# Patient Record
Sex: Male | Born: 1975 | Race: White | Hispanic: No | Marital: Married | Smoking: Former smoker
Health system: Southern US, Community
[De-identification: ages and names within clinical notes are randomized; demographics above are authoritative.]

## PROBLEM LIST (undated history)

## (undated) DIAGNOSIS — R55 Syncope and collapse: Secondary | ICD-10-CM

## (undated) DIAGNOSIS — I10 Essential (primary) hypertension: Secondary | ICD-10-CM

## (undated) DIAGNOSIS — Z8719 Personal history of other diseases of the digestive system: Secondary | ICD-10-CM

## (undated) DIAGNOSIS — E781 Pure hyperglyceridemia: Secondary | ICD-10-CM

## (undated) DIAGNOSIS — F419 Anxiety disorder, unspecified: Secondary | ICD-10-CM

## (undated) DIAGNOSIS — I779 Disorder of arteries and arterioles, unspecified: Secondary | ICD-10-CM

## (undated) DIAGNOSIS — E669 Obesity, unspecified: Secondary | ICD-10-CM

## (undated) DIAGNOSIS — Z8711 Personal history of peptic ulcer disease: Secondary | ICD-10-CM

## (undated) DIAGNOSIS — N200 Calculus of kidney: Secondary | ICD-10-CM

## (undated) DIAGNOSIS — G43909 Migraine, unspecified, not intractable, without status migrainosus: Secondary | ICD-10-CM

## (undated) DIAGNOSIS — F439 Reaction to severe stress, unspecified: Secondary | ICD-10-CM

## (undated) DIAGNOSIS — K859 Acute pancreatitis without necrosis or infection, unspecified: Secondary | ICD-10-CM

## (undated) HISTORY — DX: Disorder of arteries and arterioles, unspecified: I77.9

## (undated) HISTORY — DX: Syncope and collapse: R55

## (undated) HISTORY — DX: Reaction to severe stress, unspecified: F43.9

## (undated) HISTORY — PX: CARDIAC CATHETERIZATION: SHX172

## (undated) HISTORY — DX: Pure hyperglyceridemia: E78.1

## (undated) HISTORY — DX: Obesity, unspecified: E66.9

## (undated) HISTORY — DX: Anxiety disorder, unspecified: F41.9

---

## 2000-12-16 ENCOUNTER — Emergency Department (HOSPITAL_COMMUNITY): Admission: EM | Admit: 2000-12-16 | Discharge: 2000-12-16 | Payer: Self-pay | Admitting: Internal Medicine

## 2001-02-01 ENCOUNTER — Emergency Department (HOSPITAL_COMMUNITY): Admission: EM | Admit: 2001-02-01 | Discharge: 2001-02-02 | Payer: Self-pay | Admitting: *Deleted

## 2001-05-01 ENCOUNTER — Emergency Department (HOSPITAL_COMMUNITY): Admission: EM | Admit: 2001-05-01 | Discharge: 2001-05-02 | Payer: Self-pay | Admitting: *Deleted

## 2001-07-23 ENCOUNTER — Emergency Department (HOSPITAL_COMMUNITY): Admission: EM | Admit: 2001-07-23 | Discharge: 2001-07-23 | Payer: Self-pay | Admitting: Emergency Medicine

## 2001-08-01 ENCOUNTER — Emergency Department (HOSPITAL_COMMUNITY): Admission: EM | Admit: 2001-08-01 | Discharge: 2001-08-01 | Payer: Self-pay | Admitting: Emergency Medicine

## 2002-01-01 ENCOUNTER — Emergency Department (HOSPITAL_COMMUNITY): Admission: EM | Admit: 2002-01-01 | Discharge: 2002-01-01 | Payer: Self-pay | Admitting: Emergency Medicine

## 2002-01-01 ENCOUNTER — Encounter: Payer: Self-pay | Admitting: Emergency Medicine

## 2004-06-09 ENCOUNTER — Ambulatory Visit (HOSPITAL_COMMUNITY): Admission: RE | Admit: 2004-06-09 | Discharge: 2004-06-09 | Payer: Self-pay | Admitting: Family Medicine

## 2004-12-05 ENCOUNTER — Emergency Department (HOSPITAL_COMMUNITY): Admission: EM | Admit: 2004-12-05 | Discharge: 2004-12-05 | Payer: Self-pay | Admitting: *Deleted

## 2004-12-06 ENCOUNTER — Emergency Department (HOSPITAL_COMMUNITY): Admission: EM | Admit: 2004-12-06 | Discharge: 2004-12-06 | Payer: Self-pay | Admitting: *Deleted

## 2005-11-20 ENCOUNTER — Emergency Department (HOSPITAL_COMMUNITY): Admission: EM | Admit: 2005-11-20 | Discharge: 2005-11-20 | Payer: Self-pay | Admitting: Emergency Medicine

## 2006-04-10 ENCOUNTER — Emergency Department (HOSPITAL_COMMUNITY): Admission: EM | Admit: 2006-04-10 | Discharge: 2006-04-10 | Payer: Self-pay | Admitting: Emergency Medicine

## 2007-06-30 ENCOUNTER — Emergency Department (HOSPITAL_COMMUNITY): Admission: EM | Admit: 2007-06-30 | Discharge: 2007-06-30 | Payer: Self-pay | Admitting: Emergency Medicine

## 2008-01-12 ENCOUNTER — Emergency Department (HOSPITAL_COMMUNITY): Admission: EM | Admit: 2008-01-12 | Discharge: 2008-01-12 | Payer: Self-pay | Admitting: Emergency Medicine

## 2009-09-25 ENCOUNTER — Emergency Department (HOSPITAL_COMMUNITY): Admission: EM | Admit: 2009-09-25 | Discharge: 2009-09-25 | Payer: Self-pay | Admitting: Emergency Medicine

## 2009-11-13 ENCOUNTER — Emergency Department (HOSPITAL_COMMUNITY): Admission: EM | Admit: 2009-11-13 | Discharge: 2009-11-14 | Payer: Self-pay | Admitting: Emergency Medicine

## 2009-11-20 ENCOUNTER — Emergency Department (HOSPITAL_COMMUNITY): Admission: EM | Admit: 2009-11-20 | Discharge: 2009-11-20 | Payer: Self-pay | Admitting: Emergency Medicine

## 2009-12-01 ENCOUNTER — Emergency Department (HOSPITAL_COMMUNITY): Admission: EM | Admit: 2009-12-01 | Discharge: 2009-12-01 | Payer: Self-pay | Admitting: Emergency Medicine

## 2010-01-10 ENCOUNTER — Emergency Department (HOSPITAL_COMMUNITY): Admission: EM | Admit: 2010-01-10 | Discharge: 2010-01-10 | Payer: Self-pay | Admitting: Emergency Medicine

## 2010-02-19 ENCOUNTER — Emergency Department (HOSPITAL_COMMUNITY): Admission: EM | Admit: 2010-02-19 | Discharge: 2010-02-19 | Payer: Self-pay | Admitting: Emergency Medicine

## 2010-08-02 ENCOUNTER — Emergency Department (HOSPITAL_COMMUNITY): Payer: Self-pay

## 2010-08-02 ENCOUNTER — Emergency Department (HOSPITAL_COMMUNITY)
Admission: EM | Admit: 2010-08-02 | Discharge: 2010-08-02 | Disposition: A | Discharge status: Home / Self Care | Payer: Self-pay | Attending: Emergency Medicine | Admitting: Emergency Medicine

## 2010-08-02 DIAGNOSIS — R079 Chest pain, unspecified: Secondary | ICD-10-CM | POA: Insufficient documentation

## 2010-08-02 DIAGNOSIS — IMO0002 Reserved for concepts with insufficient information to code with codable children: Secondary | ICD-10-CM | POA: Insufficient documentation

## 2010-08-02 DIAGNOSIS — X503XXA Overexertion from repetitive movements, initial encounter: Secondary | ICD-10-CM | POA: Insufficient documentation

## 2010-08-25 LAB — URINALYSIS, ROUTINE W REFLEX MICROSCOPIC
Bilirubin Urine: NEGATIVE
Glucose, UA: NEGATIVE mg/dL
Ketones, ur: NEGATIVE mg/dL
Leukocytes, UA: NEGATIVE
Nitrite: NEGATIVE
Nitrite: POSITIVE — AB
Protein, ur: NEGATIVE mg/dL
Specific Gravity, Urine: 1.02 (ref 1.005–1.030)
Urobilinogen, UA: 0.2 mg/dL (ref 0.0–1.0)
Urobilinogen, UA: 1 mg/dL (ref 0.0–1.0)

## 2010-08-25 LAB — URINE MICROSCOPIC-ADD ON

## 2010-08-25 LAB — URINE CULTURE: Culture: NO GROWTH

## 2010-08-25 LAB — POCT I-STAT, CHEM 8
Calcium, Ion: 1.18 mmol/L (ref 1.12–1.32)
Creatinine, Ser: 0.9 mg/dL (ref 0.4–1.5)
Potassium: 4.1 mEq/L (ref 3.5–5.1)

## 2010-08-26 LAB — URINALYSIS, ROUTINE W REFLEX MICROSCOPIC
Glucose, UA: NEGATIVE mg/dL
Ketones, ur: NEGATIVE mg/dL
Specific Gravity, Urine: >1.03 — ABNORMAL HIGH (ref 1.005–1.030)

## 2010-08-26 LAB — POCT I-STAT, CHEM 8
Calcium, Ion: 1.13 mmol/L (ref 1.12–1.32)
Chloride: 106 mEq/L (ref 96–112)
HCT: 47 % (ref 39.0–52.0)
Hemoglobin: 16 g/dL (ref 13.0–17.0)
Potassium: 3.9 mEq/L (ref 3.5–5.1)
Sodium: 141 mEq/L (ref 135–145)
TCO2: 28 mmol/L (ref 0–100)

## 2010-08-26 LAB — URINE MICROSCOPIC-ADD ON

## 2010-08-28 LAB — RPR: RPR Ser Ql: NONREACTIVE

## 2010-08-28 LAB — RAPID STREP SCREEN (MED CTR MEBANE ONLY): Streptococcus, Group A Screen (Direct): NEGATIVE

## 2011-03-02 LAB — DIFFERENTIAL
Eosinophils Absolute: 0.3
Eosinophils Relative: 2
Lymphs Abs: 2.6
Neutro Abs: 7.2
Neutrophils Relative %: 65

## 2011-03-02 LAB — CBC
HCT: 46.6
MCHC: 34.1
MCV: 91
WBC: 11.1 — ABNORMAL HIGH

## 2011-03-02 LAB — BASIC METABOLIC PANEL
BUN: 14
CO2: 24
Chloride: 105
Glucose, Bld: 109 — ABNORMAL HIGH

## 2011-03-02 LAB — INFLUENZA A+B VIRUS AG-DIRECT(RAPID): Inflenza A Ag: NEGATIVE

## 2011-03-10 LAB — DIFFERENTIAL
Basophils Absolute: 0
Basophils Relative: 0
Lymphocytes Relative: 29
Lymphs Abs: 3.3
Monocytes Relative: 6
Neutrophils Relative %: 61

## 2011-03-10 LAB — BASIC METABOLIC PANEL
BUN: 14
CO2: 25
Creatinine, Ser: 1.11
GFR calc Af Amer: >60
GFR calc non Af Amer: >60
Potassium: 3.4 — ABNORMAL LOW
Sodium: 139

## 2011-03-10 LAB — CBC
HCT: 46
MCHC: 34.3
MCV: 93.8
Platelets: 266
RBC: 4.9

## 2012-03-03 ENCOUNTER — Encounter (HOSPITAL_COMMUNITY): Payer: Self-pay

## 2012-03-03 ENCOUNTER — Emergency Department (HOSPITAL_COMMUNITY)
Admission: EM | Admit: 2012-03-03 | Discharge: 2012-03-03 | Disposition: A | Discharge status: Home / Self Care | Payer: Self-pay | Attending: Emergency Medicine | Admitting: Emergency Medicine

## 2012-03-03 ENCOUNTER — Emergency Department (HOSPITAL_COMMUNITY): Payer: Self-pay

## 2012-03-03 DIAGNOSIS — R109 Unspecified abdominal pain: Secondary | ICD-10-CM | POA: Insufficient documentation

## 2012-03-03 DIAGNOSIS — N200 Calculus of kidney: Secondary | ICD-10-CM | POA: Insufficient documentation

## 2012-03-03 LAB — URINE MICROSCOPIC-ADD ON

## 2012-03-03 LAB — URINALYSIS, ROUTINE W REFLEX MICROSCOPIC
Glucose, UA: NEGATIVE mg/dL
Leukocytes, UA: NEGATIVE
Protein, ur: NEGATIVE mg/dL
pH: 6 (ref 5.0–8.0)

## 2012-03-03 MED ORDER — OXYCODONE-ACETAMINOPHEN 5-325 MG PO TABS: 1.0000 | ORAL_TABLET | Freq: Four times a day (QID) | ORAL | Status: DC | PRN

## 2012-03-03 MED ORDER — HYDROMORPHONE HCL PF 1 MG/ML IJ SOLN
1.0000 mg | Freq: Once | INTRAMUSCULAR | Status: AC
  Administered 2012-03-03: 1 mg via INTRAVENOUS
  Filled 2012-03-03: qty 1

## 2012-03-03 MED ORDER — KETOROLAC TROMETHAMINE 30 MG/ML IJ SOLN
30.0000 mg | Freq: Once | INTRAMUSCULAR | Status: AC
  Administered 2012-03-03: 30 mg via INTRAVENOUS
  Filled 2012-03-03: qty 1

## 2012-03-03 MED ORDER — ONDANSETRON HCL 4 MG/2ML IJ SOLN
4.0000 mg | Freq: Once | INTRAMUSCULAR | Status: AC
  Administered 2012-03-03: 4 mg via INTRAVENOUS
  Filled 2012-03-03: qty 2

## 2012-03-03 NOTE — ED Notes (Signed)
Pt reports left flank pain since yesterday, h/o stones, pain became so severe he could not wait to urologist in the am

## 2012-03-03 NOTE — ED Provider Notes (Signed)
History     CSN: [id number]  Arrival date & time 03/03/12  4782   First MD Initiated Contact with Patient 03/03/12 1849      Chief Complaint  Patient presents with  . Flank Pain    (Consider location/radiation/quality/duration/timing/severity/associated sxs/prior treatment) HPI Comments: Patient with history of kidney stones.  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  It has rapidly worsened since that time.  No urinary complaints.  No bowel or bladder complaints.  Feels like prior stones.    Patient is a 36 y.o. male presenting with flank pain. The history is provided by the patient.  Flank Pain This is a recurrent problem. The current episode started yesterday. The problem occurs constantly. The problem has been rapidly worsening. Associated symptoms include abdominal pain. Nothing aggravates the symptoms. Nothing relieves the symptoms. He has tried nothing for the symptoms.    Past Medical History  Diagnosis Date  . Renal disorder     History reviewed. No pertinent past surgical history.  No family history on file.  History  Substance Use Topics  . Smoking status: Current Every Day Smoker  . Smokeless tobacco: Not on file  . Alcohol Use: No      Review of Systems  Gastrointestinal: Positive for abdominal pain.  Genitourinary: Positive for flank pain.  All other systems reviewed and are negative.    Allergies  Review of patient's allergies indicates not on file.  Home Medications  No current outpatient prescriptions on file.  BP 147/98  Pulse 116  Temp 98.4 F (36.9 C) (Oral)  Resp 20  Ht 6\' 3"  (1.905 m)  Wt 265 lb (120.203 kg)  BMI 33.12 kg/m2  SpO2 99%  Physical Exam  Nursing note and vitals reviewed. Constitutional: He is oriented to person, place, and time. He appears well-developed and well-nourished.       Appears uncomfortable.  HENT:  Head: Normocephalic and atraumatic.  Mouth/Throat:  Oropharynx is clear and moist.  Neck: Normal range of motion. Neck supple.  Cardiovascular: Normal rate and regular rhythm.   Pulmonary/Chest: Effort normal and breath sounds normal. No respiratory distress. He has no wheezes.  Abdominal: Soft. Bowel sounds are normal. He exhibits no distension. There is no tenderness.       There is some left flank/cva ttp.  Musculoskeletal: Normal range of motion. He exhibits no edema.  Neurological: He is alert and oriented to person, place, and time.  Skin: Skin is warm and dry.    ED Course  Procedures (including critical care time)   Labs Reviewed  URINALYSIS, ROUTINE W REFLEX MICROSCOPIC   No results found.   No diagnosis found.    MDM  The patient presents with the acute onset of flank pain that is similar to his prior stones.  The ct shows a 1 mm stone at the uvj with mild hydro.  He is feeling better with medications given and will be discharged to home.          Geoffery Lyons, MD 03/03/12 2127

## 2012-03-21 ENCOUNTER — Emergency Department (HOSPITAL_COMMUNITY)
Admission: EM | Admit: 2012-03-21 | Discharge: 2012-03-21 | Disposition: A | Discharge status: Home / Self Care | Payer: Self-pay | Attending: Emergency Medicine | Admitting: Emergency Medicine

## 2012-03-21 ENCOUNTER — Encounter (HOSPITAL_COMMUNITY): Payer: Self-pay

## 2012-03-21 DIAGNOSIS — R51 Headache: Secondary | ICD-10-CM

## 2012-03-21 DIAGNOSIS — N289 Disorder of kidney and ureter, unspecified: Secondary | ICD-10-CM | POA: Insufficient documentation

## 2012-03-21 DIAGNOSIS — F17200 Nicotine dependence, unspecified, uncomplicated: Secondary | ICD-10-CM | POA: Insufficient documentation

## 2012-03-21 DIAGNOSIS — I10 Essential (primary) hypertension: Secondary | ICD-10-CM | POA: Insufficient documentation

## 2012-03-21 HISTORY — DX: Essential (primary) hypertension: I10

## 2012-03-21 MED ORDER — METOCLOPRAMIDE HCL 5 MG/ML IJ SOLN
10.0000 mg | Freq: Once | INTRAMUSCULAR | Status: AC
  Administered 2012-03-21: 10 mg via INTRAVENOUS
  Filled 2012-03-21: qty 2

## 2012-03-21 MED ORDER — DIPHENHYDRAMINE HCL 50 MG/ML IJ SOLN
25.0000 mg | Freq: Once | INTRAMUSCULAR | Status: AC
  Administered 2012-03-21: 25 mg via INTRAVENOUS
  Filled 2012-03-21: qty 1

## 2012-03-21 MED ORDER — LISINOPRIL-HYDROCHLOROTHIAZIDE 20-12.5 MG PO TABS: 1.0000 | ORAL_TABLET | Freq: Every day | ORAL | Status: DC

## 2012-03-21 MED ORDER — OXYCODONE-ACETAMINOPHEN 5-325 MG PO TABS
2.0000 | ORAL_TABLET | Freq: Once | ORAL | Status: AC
  Administered 2012-03-21: 2 via ORAL
  Filled 2012-03-21: qty 2

## 2012-03-21 MED ORDER — KETOROLAC TROMETHAMINE 30 MG/ML IJ SOLN
30.0000 mg | Freq: Once | INTRAMUSCULAR | Status: AC
  Administered 2012-03-21: 30 mg via INTRAVENOUS
  Filled 2012-03-21: qty 1

## 2012-03-21 MED ORDER — SODIUM CHLORIDE 0.9 % IV BOLUS (SEPSIS)
1000.0000 mL | Freq: Once | INTRAVENOUS | Status: AC
  Administered 2012-03-21: 1000 mL via INTRAVENOUS

## 2012-03-21 NOTE — ED Notes (Signed)
Pt unsure how long bp has been elevated, was throwing up 2 days ago, just not feeling good for last few days.

## 2012-03-21 NOTE — ED Provider Notes (Signed)
History   This chart was scribed for Donnetta Hutching, MD by Gerlean Ren. This patient was seen in room APA12/[facility]12 and the patient's care was started at 17:53.   CSN: [id number]  Arrival date & time 03/21/12  1723   First MD Initiated Contact with Patient 03/21/12 1731      Chief Complaint  Patient presents with  . Hypertension    (Consider location/radiation/quality/duration/timing/severity/associated sxs/prior treatment) The history is provided by the patient. No language interpreter was used.   Nicolas [name] is a 36 y.o. male with h/o HTN who presents to the Emergency Department complaining of waxing-and-waning, non-radiating right temporal and parietal HA that is severe when at worst with sudden onset this morning and associated "black dots" in visual field at times.  HA has since resolved.  Pt's wife regularly checks his BP,which was approx 170/92 this morning.  Pt recently adjusted BP medication.  Pt BP is 167/92 at bedside.  Pt reports waking up 2 days ago with several episodes of non-bloody, non-bilious emesis that has since resolved.  Pt denies fever, neck pain, sore throat, CP, cough, dyspnea, abdominal pain, nausea, emesis, diarrhea, urinary symptoms, back pain, weakness, numbness and rash as associated symptoms.  Pt is a current everyday smoker (1.5packs/day for 13 years) but denies alcohol use.   PCP is Dr. Lilyan Punt  Past Medical History  Diagnosis Date  . Renal disorder   . Hypertension     History reviewed. No pertinent past surgical history.  No family history on file.  History  Substance Use Topics  . Smoking status: Current Every Day Smoker    Types: Cigarettes  . Smokeless tobacco: Not on file  . Alcohol Use: No      Review of Systems A complete 10 system review of systems was obtained and all systems are negative except as noted in the HPI and PMH.   Allergies  Review of patient's allergies indicates no known allergies.  Home Medications   Current  Outpatient Rx  Name Route Sig Dispense Refill  . OXYCODONE-ACETAMINOPHEN 5-325 MG PO TABS Oral Take 1-2 tablets by mouth every 6 (six) hours as needed for pain. 20 tablet 0  . PRESCRIPTION MEDICATION Oral Take 1 tablet by mouth at bedtime.      BP 167/92  Pulse 101  Temp 98.4 F (36.9 C) (Oral)  Resp 20  Ht 6\' 3"  (1.905 m)  Wt 270 lb (122.471 kg)  BMI 33.75 kg/m2  SpO2 99%  Physical Exam  Nursing note and vitals reviewed. Constitutional: He is oriented to person, place, and time. He appears well-developed and well-nourished.  HENT:  Head: Normocephalic and atraumatic.  Eyes: Conjunctivae normal and EOM are normal. Pupils are equal, round, and reactive to light.  Neck: Normal range of motion. Neck supple.  Cardiovascular: Normal rate, regular rhythm and normal heart sounds.   No murmur heard. Pulmonary/Chest: Effort normal and breath sounds normal. He has no wheezes.  Abdominal: Soft. Bowel sounds are normal.  Musculoskeletal: Normal range of motion.  Neurological: He is alert and oriented to person, place, and time.  Skin: Skin is warm and dry.  Psychiatric: He has a normal mood and affect.    ED Course  Procedures (including critical care time) DIAGNOSTIC STUDIES: Oxygen Saturation is 99% on room air, normal by my interpretation.    COORDINATION OF CARE: 18:06- Patient and wife informed of clinical course, understand medical decision-making process, and agree with plan.  Ordered IV reglan, IV toradol, and IV  benadryl.      Labs Reviewed - No data to display No results found.   No diagnosis found.    MDM    Blood pressure has come down. No neuro deficits.  Will change blood pressure medication to lisinopril-hydrochlorothiazide 20-12.5       patient will followup with primary care Dr.    I personally performed the services described in this documentation, which was scribed in my presence. The recorded information has been reviewed and  considered.       Donnetta Hutching, MD 03/21/12 2031

## 2012-10-23 ENCOUNTER — Encounter: Payer: Self-pay | Admitting: *Deleted

## 2012-10-24 ENCOUNTER — Ambulatory Visit (INDEPENDENT_AMBULATORY_CARE_PROVIDER_SITE_OTHER): Payer: Self-pay | Admitting: Family Medicine

## 2012-10-24 ENCOUNTER — Encounter: Payer: Self-pay | Admitting: Family Medicine

## 2012-10-24 VITALS — BP 168/102 | Wt 274.4 lb

## 2012-10-24 DIAGNOSIS — Z0000 Encounter for general adult medical examination without abnormal findings: Secondary | ICD-10-CM

## 2012-10-24 DIAGNOSIS — I10 Essential (primary) hypertension: Secondary | ICD-10-CM | POA: Insufficient documentation

## 2012-10-24 MED ORDER — NAPROXEN 500 MG PO TABS: 500.0000 mg | ORAL_TABLET | Freq: Two times a day (BID) | ORAL | Status: DC

## 2012-10-24 MED ORDER — ENALAPRIL MALEATE 10 MG PO TABS: 10.0000 mg | ORAL_TABLET | Freq: Every day | ORAL | Status: DC

## 2012-10-24 NOTE — Progress Notes (Signed)
  Subjective:    Patient ID: Nicolas [name], male    DOB: Jun 25, 1975, 37 y.o.   MRN: [id number]  HPI Patient's blood pressure is elevated today he states he ran out of medications a few weeks ago he comes in today to get refill he relates the best medicine for him was in enalapril and he would like to get back on it. He denies chest tightness pressure pain shortness of breath. He does smoke he knows he needs to quit he also relates that he has left elbow pain discomfort or the past couple weeks sometimes radiates down the arm no pain in the neck or upper arm no chest tightness pressure or shortness of breath  Review of Systems See above.    Objective:   Physical Exam Blood pressure elevated. Lungs are clear heart is regular. Patient does have tenderness of the left elbow       Assessment & Plan:  Hypertension-enalapril refills given. Lab work ordered. Patient followup in 4-6 months Lateral epicondylitis-Naprosyn 500 mg twice a day over the course of the next 10-14 days if that doesn't help consider injection

## 2012-10-30 ENCOUNTER — Encounter (HOSPITAL_COMMUNITY): Payer: Self-pay

## 2012-10-30 ENCOUNTER — Emergency Department (HOSPITAL_COMMUNITY): Payer: Self-pay

## 2012-10-30 ENCOUNTER — Emergency Department (HOSPITAL_COMMUNITY)
Admission: EM | Admit: 2012-10-30 | Discharge: 2012-10-30 | Disposition: A | Discharge status: Home / Self Care | Payer: Self-pay | Attending: Emergency Medicine | Admitting: Emergency Medicine

## 2012-10-30 ENCOUNTER — Telehealth: Payer: Self-pay | Admitting: Family Medicine

## 2012-10-30 DIAGNOSIS — J069 Acute upper respiratory infection, unspecified: Secondary | ICD-10-CM | POA: Insufficient documentation

## 2012-10-30 DIAGNOSIS — R059 Cough, unspecified: Secondary | ICD-10-CM | POA: Insufficient documentation

## 2012-10-30 DIAGNOSIS — R05 Cough: Secondary | ICD-10-CM | POA: Insufficient documentation

## 2012-10-30 DIAGNOSIS — I10 Essential (primary) hypertension: Secondary | ICD-10-CM | POA: Insufficient documentation

## 2012-10-30 DIAGNOSIS — R071 Chest pain on breathing: Secondary | ICD-10-CM | POA: Insufficient documentation

## 2012-10-30 DIAGNOSIS — Z79899 Other long term (current) drug therapy: Secondary | ICD-10-CM | POA: Insufficient documentation

## 2012-10-30 DIAGNOSIS — F17200 Nicotine dependence, unspecified, uncomplicated: Secondary | ICD-10-CM | POA: Insufficient documentation

## 2012-10-30 DIAGNOSIS — J3489 Other specified disorders of nose and nasal sinuses: Secondary | ICD-10-CM | POA: Insufficient documentation

## 2012-10-30 DIAGNOSIS — Z87448 Personal history of other diseases of urinary system: Secondary | ICD-10-CM | POA: Insufficient documentation

## 2012-10-30 HISTORY — DX: Calculus of kidney: N20.0

## 2012-10-30 MED ORDER — ALBUTEROL SULFATE (5 MG/ML) 0.5% IN NEBU
5.0000 mg | INHALATION_SOLUTION | Freq: Once | RESPIRATORY_TRACT | Status: AC
  Administered 2012-10-30: 5 mg via RESPIRATORY_TRACT
  Filled 2012-10-30: qty 1

## 2012-10-30 MED ORDER — IPRATROPIUM BROMIDE 0.02 % IN SOLN
0.5000 mg | Freq: Once | RESPIRATORY_TRACT | Status: AC
  Administered 2012-10-30: 0.5 mg via RESPIRATORY_TRACT
  Filled 2012-10-30: qty 2.5

## 2012-10-30 MED ORDER — ACETAMINOPHEN 500 MG PO TABS
1000.0000 mg | ORAL_TABLET | Freq: Once | ORAL | Status: AC
  Administered 2012-10-30: 1000 mg via ORAL
  Filled 2012-10-30: qty 2

## 2012-10-30 MED ORDER — HYDROCODONE-ACETAMINOPHEN 5-325 MG PO TABS: ORAL_TABLET | ORAL | Status: DC

## 2012-10-30 MED ORDER — BENZONATATE 100 MG PO CAPS: 100.0000 mg | ORAL_CAPSULE | Freq: Three times a day (TID) | ORAL | Status: DC | PRN

## 2012-10-30 MED ORDER — IBUPROFEN 400 MG PO TABS
400.0000 mg | ORAL_TABLET | Freq: Once | ORAL | Status: AC
  Administered 2012-10-30: 400 mg via ORAL
  Filled 2012-10-30: qty 1

## 2012-10-30 MED ORDER — ALBUTEROL SULFATE HFA 108 (90 BASE) MCG/ACT IN AERS
2.0000 | INHALATION_SPRAY | RESPIRATORY_TRACT | Status: AC
  Administered 2012-10-30: 2 via RESPIRATORY_TRACT
  Filled 2012-10-30: qty 6.7

## 2012-10-30 NOTE — Telephone Encounter (Signed)
Patient has a really bad cough and it is starting to hurt his chest. Please advise.  [place]

## 2012-10-30 NOTE — ED Notes (Signed)
Pt c/o cough and chest congestion. Pt states earlier today he coughed so hard he "pulled a muscle" in his chest.

## 2012-10-30 NOTE — Telephone Encounter (Signed)
Discussed with Arline Asp (emergency contact) and advised her that patient needs to be seen. She stated that she will call the patient and have him call the office to schedule an appt.

## 2012-10-30 NOTE — ED Provider Notes (Signed)
History     CSN: [id number]  Arrival date & time 10/30/12  1825   First MD Initiated Contact with Patient 10/30/12 1839      Chief Complaint  Patient presents with  . Chest Pain    HPI Pt was seen at 1845.   Per pt, c/o gradual onset and persistence of constant cough for the past 4 days.  Has been associated with runny/stuffy nose and sinus congestion.  Pt states he "cough so hard I hurt my chest," c/o pain in his right upper chest wall area that worsens with cough, body movement, and palpation of the area.  States "the entire family" has similar symptoms. Pt continues to smoke cigarettes daily.  Denies palpitations, no SOB, no abd pain, no back pain, no N/V/D, no rash, no fevers.    Past Medical History  Diagnosis Date  . Renal disorder   . Hypertension     History reviewed. No pertinent past surgical history.  Family History  Problem Relation Age of Onset  . Cancer Mother     Breast    History  Substance Use Topics  . Smoking status: Current Every Day Smoker    Types: Cigarettes  . Smokeless tobacco: Not on file  . Alcohol Use: No      Review of Systems ROS: Statement: All systems negative except as marked or noted in the HPI; Constitutional: Negative for fever and chills. ; ; Eyes: Negative for eye pain, redness and discharge. ; ; ENMT: Negative for ear pain, hoarseness, sore throat, +nasal congestion, rhinorrhea, sinus pressure. ; ; Cardiovascular: Negative for chest pain, palpitations, diaphoresis, dyspnea and peripheral edema. ; ; Respiratory: +cough. Negative for wheezing and stridor. ; ; Gastrointestinal: Negative for nausea, vomiting, diarrhea, abdominal pain, blood in stool, hematemesis, jaundice and rectal bleeding. . ; ; Genitourinary: Negative for dysuria, flank pain and hematuria. ; ; Musculoskeletal: +chest wall pain. Negative for back pain and neck pain. Negative for swelling and trauma.; ; Skin: Negative for pruritus, rash, abrasions, blisters, bruising and  skin lesion.; ; Neuro: Negative for headache, lightheadedness and neck stiffness. Negative for weakness, altered level of consciousness , altered mental status, extremity weakness, paresthesias, involuntary movement, seizure and syncope.       Allergies  Review of patient's allergies indicates no known allergies.  Home Medications   Current Outpatient Rx  Name  Route  Sig  Dispense  Refill  . enalapril (VASOTEC) 10 MG tablet   Oral   Take 1 tablet (10 mg total) by mouth daily.   30 tablet   7   . naproxen (NAPROSYN) 500 MG tablet   Oral   Take 1 tablet (500 mg total) by mouth 2 (two) times daily with a meal.   42 tablet   0     BP 162/109  Pulse 108  Temp(Src) 98.3 F (36.8 C) (Oral)  Resp 22  Ht 6\' 3"  (1.905 m)  Wt 265 lb (120.203 kg)  BMI 33.12 kg/m2  SpO2 99%  Physical Exam 1850: Physical examination:  Nursing notes reviewed; Vital signs and O2 SAT reviewed;  Constitutional: Well developed, Well nourished, Well hydrated, In no acute distress; Head:  Normocephalic, atraumatic; Eyes: EOMI, PERRL, No scleral icterus; ENMT: TM's clear bilat. +edemetous nasal turbinates bilat with clear rhinorrhea.  Mouth and pharynx normal, Mucous membranes moist; Neck: Supple, Full range of motion, No lymphadenopathy; Cardiovascular: Regular rate and rhythm, No murmur, rub, or gallop; Respiratory: Breath sounds coarse & equal bilaterally, scattered faint exp wheezes  bilat. Speaking full sentences with ease, Normal respiratory effort/excursion. +non-productive cough during exam.; Chest: +right upper anterior chest wall tenderness to palp. No soft tissue crepitus, no rash. Movement normal; Abdomen: Soft, Nontender, Nondistended, Normal bowel sounds; Genitourinary: No CVA tenderness; Extremities: Pulses normal, No tenderness, No edema, No calf edema or asymmetry.; Neuro: AA&Ox3, Major CN grossly intact.  Speech clear. No gross focal motor or sensory deficits in extremities.; Skin: Color normal,  Warm, Dry.   ED Course  Procedures     MDM  MDM Reviewed: previous chart, nursing note and vitals Reviewed previous: ECG Interpretation: x-ray and ECG    Date: 10/30/2012  Rate: 102  Rhythm: sinus tachycardia  QRS Axis: normal  Intervals: normal  ST/T Wave abnormalities: normal  Conduction Disutrbances:none  Narrative Interpretation:   Old EKG Reviewed: unchanged; no significant changes from previous EKG dated 01/12/2008.   Dg Chest 2 View 10/30/2012   *RADIOLOGY REPORT*  Clinical Data: Chest pain, cough for 4 days, right anterior rib pain, history smoking, hypertension  CHEST - 2 VIEW  Comparison: 08/02/2010  Findings: Normal heart size, mediastinal contours, and pulmonary vascularity. Peribronchial thickening without infiltrate, pleural effusion, or pneumothorax. No acute osseous findings. Scattered end plate spur formation thoracic spine.  IMPRESSION: Mild bronchitic changes.   Original Report Authenticated By: Ulyses Southward, M.D.   Dg Ribs Unilateral Right 10/30/2012   *RADIOLOGY REPORT*  Clinical Data: Cough for 4 days, chest pain and right anterior rib pain, history hypertension, smoking  RIGHT RIBS - 2 VIEW  Comparison: Chest radiograph 10/30/2012  Findings: Osseous mineralization grossly normal for technique. No definite rib fracture or bone destruction identified.  IMPRESSION: No acute right rib abnormalities identified.   Original Report Authenticated By: Ulyses Southward, M.D.      2020:  Pt feels better after neb. Lungs CTA bilat, no wheezing, resps easy, Sats 100% R/A. Speaking full sentences with ease. No pneumonia or rib fx on CXR. Will tx URI and msk pain symptomatically at this time.  Dx and testing d/w pt and family.  Questions answered.  Verb understanding, agreeable to d/c home with outpt f/u.      Laray Anger, DO 11/01/12 1846

## 2012-10-30 NOTE — ED Notes (Signed)
Pt states he has had a cold/cough. States he coughed and felt something pop in his left chest area

## 2013-08-06 ENCOUNTER — Ambulatory Visit (INDEPENDENT_AMBULATORY_CARE_PROVIDER_SITE_OTHER): Payer: BC Managed Care – PPO | Admitting: Family Medicine

## 2013-08-06 ENCOUNTER — Encounter: Payer: Self-pay | Admitting: Family Medicine

## 2013-08-06 VITALS — BP 146/80 | HR 78 | Temp 98.8°F | Resp 14

## 2013-08-06 DIAGNOSIS — I10 Essential (primary) hypertension: Secondary | ICD-10-CM

## 2013-08-06 DIAGNOSIS — B356 Tinea cruris: Secondary | ICD-10-CM

## 2013-08-06 DIAGNOSIS — J019 Acute sinusitis, unspecified: Secondary | ICD-10-CM

## 2013-08-06 MED ORDER — BENZONATATE 100 MG PO CAPS: 200.0000 mg | ORAL_CAPSULE | Freq: Three times a day (TID) | ORAL | Status: DC | PRN

## 2013-08-06 MED ORDER — AZITHROMYCIN 250 MG PO TABS: ORAL_TABLET | ORAL | Status: DC

## 2013-08-06 MED ORDER — KETOCONAZOLE 2 % EX CREA: 1.0000 "application " | TOPICAL_CREAM | Freq: Two times a day (BID) | CUTANEOUS | Status: DC

## 2013-08-06 NOTE — Progress Notes (Signed)
   Subjective:    Patient ID: Nicolas [name], male    DOB: Sep 12, 1975, 38 y.o.   MRN: [id number]  HPI patient with head congestion sinus pressure drainage coughing symptoms present or past  day. In addition to this He relates a sharp pain in the temple areas when he coughs. No headache in between no nausea vomiting diarrhea no blurry vision. Energy level fair. Does a lot of work. His been stretched lately. PMH benign, HTN  Review of Systems Denies chest pain shortness of breath    Objective:   Physical Exam Mild temporal tenderness neck supple lungs clear heart regular eardrums normal throat normal       Assessment & Plan:  Viral syndrome secondary sinusitis antibiotics prescribed HTN followup blood pressure still showed elevation I. encourage patient to go ahead and can increase the dose of his medication but he does not want to do that currently. He states he'll watch his diet try to consume less caffeine followup again in 4 weeks' time to recheck his blood pressure.  Finally he has yeast infection in the groin Nizoral prescribed

## 2013-08-06 NOTE — Patient Instructions (Signed)
DASH Diet  The DASH diet stands for "Dietary Approaches to Stop Hypertension." It is a healthy eating plan that has been shown to reduce high blood pressure (hypertension) in as little as 14 days, while also possibly providing other significant health benefits. These other health benefits include reducing the risk of breast cancer after menopause and reducing the risk of type 2 diabetes, heart disease, colon cancer, and stroke. Health benefits also include weight loss and slowing kidney failure in patients with chronic kidney disease.   DIET GUIDELINES  · Limit salt (sodium). Your diet should contain less than 1500 mg of sodium daily.  · Limit refined or processed carbohydrates. Your diet should include mostly whole grains. Desserts and added sugars should be used sparingly.  · Include small amounts of heart-healthy fats. These types of fats include nuts, oils, and tub margarine. Limit saturated and trans fats. These fats have been shown to be harmful in the body.  CHOOSING FOODS   The following food groups are based on a 2000 calorie diet. See your Registered Dietitian for individual calorie needs.  Grains and Grain Products (6 to 8 servings daily)  · Eat More Often: Whole-wheat bread, brown rice, whole-grain or wheat pasta, quinoa, popcorn without added fat or salt (air popped).  · Eat Less Often: White bread, white pasta, white rice, cornbread.  Vegetables (4 to 5 servings daily)  · Eat More Often: Fresh, frozen, and canned vegetables. Vegetables may be raw, steamed, roasted, or grilled with a minimal amount of fat.  · Eat Less Often/Avoid: Creamed or fried vegetables. Vegetables in a cheese sauce.  Fruit (4 to 5 servings daily)  · Eat More Often: All fresh, canned (in natural juice), or frozen fruits. Dried fruits without added sugar. One hundred percent fruit juice (½ cup [237 mL] daily).  · Eat Less Often: Dried fruits with added sugar. Canned fruit in light or heavy syrup.  Lean Meats, Fish, and Poultry (2  servings or less daily. One serving is 3 to 4 oz [85-114 g]).  · Eat More Often: Ninety percent or leaner ground beef, tenderloin, sirloin. Round cuts of beef, chicken breast, turkey breast. All fish. Grill, bake, or broil your meat. Nothing should be fried.  · Eat Less Often/Avoid: Fatty cuts of meat, turkey, or chicken leg, thigh, or wing. Fried cuts of meat or fish.  Dairy (2 to 3 servings)  · Eat More Often: Low-fat or fat-free milk, low-fat plain or light yogurt, reduced-fat or part-skim cheese.  · Eat Less Often/Avoid: Milk (whole, 2%). Whole milk yogurt. Full-fat cheeses.  Nuts, Seeds, and Legumes (4 to 5 servings per week)  · Eat More Often: All without added salt.  · Eat Less Often/Avoid: Salted nuts and seeds, canned beans with added salt.  Fats and Sweets (limited)  · Eat More Often: Vegetable oils, tub margarines without trans fats, sugar-free gelatin. Mayonnaise and salad dressings.  · Eat Less Often/Avoid: Coconut oils, palm oils, butter, stick margarine, cream, half and half, cookies, candy, pie.  FOR MORE INFORMATION  The Dash Diet Eating Plan: www.dashdiet.org  Document Released: 05/18/2011 Document Revised: 08/21/2011 Document Reviewed: 05/18/2011  [employer]® Patient Information ©2014 [employer].

## 2013-12-21 ENCOUNTER — Emergency Department (HOSPITAL_COMMUNITY)
Admission: EM | Admit: 2013-12-21 | Discharge: 2013-12-21 | Disposition: A | Discharge status: Home / Self Care | Payer: Self-pay | Attending: Emergency Medicine | Admitting: Emergency Medicine

## 2013-12-21 ENCOUNTER — Encounter (HOSPITAL_COMMUNITY): Payer: Self-pay | Admitting: Emergency Medicine

## 2013-12-21 DIAGNOSIS — I10 Essential (primary) hypertension: Secondary | ICD-10-CM | POA: Insufficient documentation

## 2013-12-21 DIAGNOSIS — Z792 Long term (current) use of antibiotics: Secondary | ICD-10-CM | POA: Insufficient documentation

## 2013-12-21 DIAGNOSIS — F17200 Nicotine dependence, unspecified, uncomplicated: Secondary | ICD-10-CM | POA: Insufficient documentation

## 2013-12-21 DIAGNOSIS — R51 Headache: Secondary | ICD-10-CM | POA: Insufficient documentation

## 2013-12-21 DIAGNOSIS — R519 Headache, unspecified: Secondary | ICD-10-CM

## 2013-12-21 DIAGNOSIS — Z79899 Other long term (current) drug therapy: Secondary | ICD-10-CM | POA: Insufficient documentation

## 2013-12-21 DIAGNOSIS — Z87442 Personal history of urinary calculi: Secondary | ICD-10-CM | POA: Insufficient documentation

## 2013-12-21 MED ORDER — KETOROLAC TROMETHAMINE 30 MG/ML IJ SOLN
30.0000 mg | Freq: Once | INTRAMUSCULAR | Status: AC
  Administered 2013-12-21: 30 mg via INTRAVENOUS
  Filled 2013-12-21: qty 1

## 2013-12-21 MED ORDER — TRAMADOL HCL 50 MG PO TABS: 50.0000 mg | ORAL_TABLET | Freq: Four times a day (QID) | ORAL | Status: DC | PRN

## 2013-12-21 MED ORDER — DIPHENHYDRAMINE HCL 50 MG/ML IJ SOLN
25.0000 mg | Freq: Once | INTRAMUSCULAR | Status: AC
  Administered 2013-12-21: 25 mg via INTRAVENOUS
  Filled 2013-12-21: qty 1

## 2013-12-21 MED ORDER — METOCLOPRAMIDE HCL 5 MG/ML IJ SOLN
10.0000 mg | Freq: Once | INTRAMUSCULAR | Status: AC
  Administered 2013-12-21: 10 mg via INTRAVENOUS
  Filled 2013-12-21: qty 2

## 2013-12-21 NOTE — ED Notes (Addendum)
Patient c/o headache that started this morning. Per patient patient "throbbing, pulsating headache." Patient states "I know it's my blood pressure." Per patient took his lisinopril 5mg  with no relief. Patient denies any dizziness, slurred speech, or weakness. Per patient blood pressure was "200 something/100 something" at home. Per patient sensitivity to light and sound.

## 2013-12-21 NOTE — Discharge Instructions (Signed)
Follow up as needed

## 2013-12-21 NOTE — ED Provider Notes (Signed)
CSN: [id number]     Arrival date & time 12/21/13  1708 History  This chart was scribed for Benny Lennert, MD by Quintella Reichert, ED scribe.  This patient was seen in room APA04/APA04 and the patient's care was started at 6:49 PM.   Chief Complaint  Patient presents with  . Headache  . Hypertension    Patient is a 38 y.o. male presenting with headaches. The history is provided by the patient. No language interpreter was used.  Headache Pain severity now: severe. Duration:  8 hours Timing:  Constant Progression:  Worsening Chronicity:  New Similar to prior headaches: yes   Context comment:  Pt suspsects high BP Ineffective treatments:  Acetaminophen Associated symptoms: no abdominal pain, no back pain, no congestion, no cough, no diarrhea, no fatigue, no seizures and no sinus pressure     HPI Comments: Nicolas [name] is a 38 y.o. male who presents to the Emergency Department complaining of a severe, "throbbing pulsating" headache that began this morning with associated elevated BP.  Pt states he has similar headaches whenever his BP becomes too high.  He has used Tylenol, without relief.  He takes lisinopril 5mg  and took it this morning.  He denies fevers, chills, or cough.   Past Medical History  Diagnosis Date  . Hypertension   . Kidney stone     History reviewed. No pertinent past surgical history.   Family History  Problem Relation Age of Onset  . Cancer Mother     Breast    History  Substance Use Topics  . Smoking status: Current Every Day Smoker -- 1.50 packs/day for 10 years    Types: Cigarettes  . Smokeless tobacco: Never Used  . Alcohol Use: No     Review of Systems  Constitutional: Negative for appetite change and fatigue.  HENT: Negative for congestion, ear discharge and sinus pressure.   Eyes: Negative for discharge.  Respiratory: Negative for cough.   Cardiovascular: Negative for chest pain.  Gastrointestinal: Negative for abdominal pain and  diarrhea.  Genitourinary: Negative for frequency and hematuria.  Musculoskeletal: Negative for back pain.  Skin: Negative for rash.  Neurological: Positive for headaches. Negative for seizures.  Psychiatric/Behavioral: Negative for hallucinations.      Allergies  Review of patient's allergies indicates no known allergies.  Home Medications   Prior to Admission medications   Medication Sig Start Date End Date Taking? Authorizing Provider  azithromycin (ZITHROMAX Z-PAK) 250 MG tablet Take 2 tablets (500 mg) on  Day 1,  followed by 1 tablet (250 mg) once daily on Days 2 through 5. 08/06/13   [name], MD  benzonatate (TESSALON) 100 MG capsule Take 2 capsules (200 mg total) by mouth 3 (three) times daily as needed for cough. 08/06/13   [name], MD  enalapril (VASOTEC) 10 MG tablet Take 1 tablet (10 mg total) by mouth daily. 10/24/12   [name], MD  HYDROcodone-acetaminophen (NORCO/VICODIN) 5-325 MG per tablet 1 or 2 tabs PO q6 hours prn pain 10/30/12   Laray Anger, DO  ketoconazole (NIZORAL) 2 % cream Apply 1 application topically 2 (two) times daily. 08/06/13   [name], MD  naproxen (NAPROSYN) 500 MG tablet Take 1 tablet (500 mg total) by mouth 2 (two) times daily with a meal. 10/24/12   [name], MD   BP 164/94  Pulse 87  Temp(Src) 97.8 F (36.6 C) (Oral)  Resp 20  Ht 6\' 3"  (1.905 m)  Wt 265 lb (120.203 kg)  BMI 33.12 kg/m2  SpO2 97%  Physical Exam  Nursing note and vitals reviewed. Constitutional: He is oriented to person, place, and time. He appears well-developed.  HENT:  Head: Normocephalic.  Eyes: Conjunctivae and EOM are normal. No scleral icterus.  Neck: Neck supple. No thyromegaly present.  Cardiovascular: Normal rate and regular rhythm.  Exam reveals no gallop and no friction rub.   No murmur heard. Pulmonary/Chest: No stridor. He has no wheezes. He has no rales. He exhibits no tenderness.  Abdominal: He exhibits no distension. There  is no tenderness. There is no rebound.  Musculoskeletal: Normal range of motion. He exhibits no edema.  Lymphadenopathy:    He has no cervical adenopathy.  Neurological: He is oriented to person, place, and time. He exhibits normal muscle tone. Coordination normal.  Skin: No rash noted. No erythema.  Psychiatric: He has a normal mood and affect. His behavior is normal.    ED Course  Procedures (including critical care time)  DIAGNOSTIC STUDIES: Oxygen Saturation is 97% on room air, normal by my interpretation.    COORDINATION OF CARE: 6:49 PM-Discussed treatment plan which includes pain medication with pt at bedside and pt agreed to plan.     Labs Review Labs Reviewed - No data to display  Imaging Review No results found.   EKG Interpretation None      MDM   Final diagnoses:  None  headache resolved   The chart was scribed for me under my direct supervision.  I personally performed the history, physical, and medical decision making and all procedures in the evaluation of this patient.Benny Lennert.   Dewie Ahart L Fawnda Vitullo, MD 12/21/13 2031

## 2014-01-10 HISTORY — PX: CARDIAC CATHETERIZATION: SHX172

## 2014-01-18 ENCOUNTER — Encounter (HOSPITAL_COMMUNITY): Payer: Self-pay | Admitting: *Deleted

## 2014-01-18 ENCOUNTER — Encounter (HOSPITAL_COMMUNITY)
Admission: EM | Disposition: A | Discharge status: Home / Self Care | Payer: Self-pay | Source: Other Acute Inpatient Hospital | Attending: Cardiovascular Disease

## 2014-01-18 ENCOUNTER — Ambulatory Visit (HOSPITAL_COMMUNITY): Admit: 2014-01-18 | Payer: Self-pay | Admitting: Cardiovascular Disease

## 2014-01-18 ENCOUNTER — Inpatient Hospital Stay (HOSPITAL_COMMUNITY)
Admission: EM | Admit: 2014-01-18 | Discharge: 2014-01-19 | DRG: 948 | Disposition: A | Discharge status: Home / Self Care | Payer: Self-pay | Source: Other Acute Inpatient Hospital | Attending: Cardiovascular Disease | Admitting: Cardiovascular Disease

## 2014-01-18 ENCOUNTER — Inpatient Hospital Stay (HOSPITAL_COMMUNITY): Payer: Self-pay

## 2014-01-18 ENCOUNTER — Ambulatory Visit (HOSPITAL_COMMUNITY): Payer: Self-pay

## 2014-01-18 DIAGNOSIS — R9431 Abnormal electrocardiogram [ECG] [EKG]: Secondary | ICD-10-CM

## 2014-01-18 DIAGNOSIS — R0789 Other chest pain: Secondary | ICD-10-CM | POA: Diagnosis present

## 2014-01-18 DIAGNOSIS — R4182 Altered mental status, unspecified: Principal | ICD-10-CM

## 2014-01-18 DIAGNOSIS — I10 Essential (primary) hypertension: Secondary | ICD-10-CM

## 2014-01-18 DIAGNOSIS — E860 Dehydration: Secondary | ICD-10-CM | POA: Diagnosis present

## 2014-01-18 DIAGNOSIS — F17200 Nicotine dependence, unspecified, uncomplicated: Secondary | ICD-10-CM | POA: Diagnosis present

## 2014-01-18 DIAGNOSIS — I259 Chronic ischemic heart disease, unspecified: Secondary | ICD-10-CM

## 2014-01-18 DIAGNOSIS — N289 Disorder of kidney and ureter, unspecified: Secondary | ICD-10-CM | POA: Diagnosis present

## 2014-01-18 HISTORY — PX: LEFT HEART CATHETERIZATION WITH CORONARY ANGIOGRAM: SHX5451

## 2014-01-18 LAB — COMPREHENSIVE METABOLIC PANEL
ALBUMIN: 4.1 g/dL (ref 3.5–5.2)
ALT: 43 U/L (ref 0–53)
ALT: 46 U/L (ref 0–53)
ANION GAP: 24 — AB (ref 5–15)
ANION GAP: 13 (ref 5–15)
AST: 41 U/L — ABNORMAL HIGH (ref 0–37)
AST: 39 U/L — ABNORMAL HIGH (ref 0–37)
Albumin: 3.8 g/dL (ref 3.5–5.2)
Alkaline Phosphatase: 139 U/L — ABNORMAL HIGH (ref 39–117)
Alkaline Phosphatase: 108 U/L (ref 39–117)
BILIRUBIN TOTAL: 0.3 mg/dL (ref 0.3–1.2)
BUN: 19 mg/dL (ref 6–23)
BUN: 24 mg/dL — AB (ref 6–23)
CALCIUM: 9.3 mg/dL (ref 8.4–10.5)
CALCIUM: 9.3 mg/dL (ref 8.4–10.5)
CHLORIDE: 104 meq/L (ref 96–112)
CO2: 18 mEq/L — ABNORMAL LOW (ref 19–32)
CO2: 27 mEq/L (ref 19–32)
CREATININE: 1.42 mg/dL — AB (ref 0.50–1.35)
Chloride: 100 mEq/L (ref 96–112)
Creatinine, Ser: 1.24 mg/dL (ref 0.50–1.35)
GFR calc Af Amer: 84 mL/min — ABNORMAL LOW (ref >90)
GFR, EST AFRICAN AMERICAN: 72 mL/min — AB (ref >90)
GFR, EST NON AFRICAN AMERICAN: 62 mL/min — AB (ref >90)
GFR, EST NON AFRICAN AMERICAN: 73 mL/min — AB (ref >90)
Glucose, Bld: 240 mg/dL — ABNORMAL HIGH (ref 70–99)
Glucose, Bld: 89 mg/dL (ref 70–99)
Potassium: 4 mEq/L (ref 3.7–5.3)
Potassium: 3.9 mEq/L (ref 3.7–5.3)
Sodium: 142 mEq/L (ref 137–147)
Sodium: 144 mEq/L (ref 137–147)
TOTAL PROTEIN: 6.8 g/dL (ref 6.0–8.3)
Total Bilirubin: 0.4 mg/dL (ref 0.3–1.2)
Total Protein: 6.7 g/dL (ref 6.0–8.3)

## 2014-01-18 LAB — CBC
HEMATOCRIT: 47.9 % (ref 39.0–52.0)
HEMATOCRIT: 48.1 % (ref 39.0–52.0)
Hemoglobin: 17.2 g/dL — ABNORMAL HIGH (ref 13.0–17.0)
Hemoglobin: 17.3 g/dL — ABNORMAL HIGH (ref 13.0–17.0)
MCH: 32.8 pg (ref 26.0–34.0)
MCH: 32.4 pg (ref 26.0–34.0)
MCHC: 35.9 g/dL (ref 30.0–36.0)
MCHC: 36 g/dL (ref 30.0–36.0)
MCV: 91.4 fL (ref 78.0–100.0)
MCV: 90.1 fL (ref 78.0–100.0)
Platelets: 311 10*3/uL (ref 150–400)
Platelets: 283 10*3/uL (ref 150–400)
RBC: 5.24 MIL/uL (ref 4.22–5.81)
RBC: 5.34 MIL/uL (ref 4.22–5.81)
RDW: 14.5 % (ref 11.5–15.5)
RDW: 14.4 % (ref 11.5–15.5)
WBC: 20.9 10*3/uL — ABNORMAL HIGH (ref 4.0–10.5)
WBC: 33.8 10*3/uL — AB (ref 4.0–10.5)

## 2014-01-18 LAB — GLUCOSE, CAPILLARY
GLUCOSE-CAPILLARY: 131 mg/dL — AB (ref 70–99)
Glucose-Capillary: 158 mg/dL — ABNORMAL HIGH (ref 70–99)
Glucose-Capillary: 133 mg/dL — ABNORMAL HIGH (ref 70–99)
Glucose-Capillary: 122 mg/dL — ABNORMAL HIGH (ref 70–99)

## 2014-01-18 LAB — PROTIME-INR
INR: 1.2 (ref 0.00–1.49)
PROTHROMBIN TIME: 15.2 s (ref 11.6–15.2)

## 2014-01-18 LAB — RAPID URINE DRUG SCREEN, HOSP PERFORMED
Amphetamines: NOT DETECTED
BARBITURATES: NOT DETECTED
Benzodiazepines: NOT DETECTED
Cocaine: NOT DETECTED
Opiates: NOT DETECTED
Tetrahydrocannabinol: NOT DETECTED

## 2014-01-18 LAB — ETHANOL: Alcohol, Ethyl (B): <11 mg/dL (ref 0–11)

## 2014-01-18 LAB — HEMOGLOBIN A1C
Hgb A1c MFr Bld: 5.2 % (ref <5.7)
Mean Plasma Glucose: 103 mg/dL (ref <117)

## 2014-01-18 LAB — APTT: aPTT: 87 seconds — ABNORMAL HIGH (ref 24–37)

## 2014-01-18 LAB — MRSA PCR SCREENING: MRSA BY PCR: NEGATIVE

## 2014-01-18 LAB — I-STAT TROPONIN, ED: Troponin i, poc: 0.02 ng/mL (ref 0.00–0.08)

## 2014-01-18 SURGERY — LEFT HEART CATHETERIZATION WITH CORONARY ANGIOGRAM
Anesthesia: LOCAL

## 2014-01-18 MED ORDER — LIDOCAINE HCL (PF) 1 % IJ SOLN
INTRAMUSCULAR | Status: AC
  Filled 2014-01-18: qty 30

## 2014-01-18 MED ORDER — FENTANYL CITRATE 0.05 MG/ML IJ SOLN
INTRAMUSCULAR | Status: AC
  Filled 2014-01-18: qty 2

## 2014-01-18 MED ORDER — HEPARIN SODIUM (PORCINE) 5000 UNIT/ML IJ SOLN
4000.0000 [IU] | INTRAMUSCULAR | Status: AC
  Administered 2014-01-18: 4000 [IU] via INTRAVENOUS

## 2014-01-18 MED ORDER — THIAMINE HCL 100 MG/ML IJ SOLN
Freq: Once | INTRAVENOUS | Status: AC
  Administered 2014-01-18: 04:00:00 via INTRAVENOUS
  Filled 2014-01-18: qty 1000

## 2014-01-18 MED ORDER — ASPIRIN 300 MG RE SUPP
300.0000 mg | Freq: Once | RECTAL | Status: DC
  Filled 2014-01-18: qty 1

## 2014-01-18 MED ORDER — CARVEDILOL 6.25 MG PO TABS
6.2500 mg | ORAL_TABLET | Freq: Two times a day (BID) | ORAL | Status: DC
  Administered 2014-01-18 – 2014-01-19 (×2): 6.25 mg via ORAL
  Filled 2014-01-18 (×4): qty 1

## 2014-01-18 MED ORDER — ACETAMINOPHEN 325 MG PO TABS: 650.0000 mg | ORAL_TABLET | ORAL | Status: DC | PRN

## 2014-01-18 MED ORDER — SODIUM CHLORIDE 0.9 % IJ SOLN
3.0000 mL | Freq: Two times a day (BID) | INTRAMUSCULAR | Status: DC
  Administered 2014-01-18: 3 mL via INTRAVENOUS

## 2014-01-18 MED ORDER — ASPIRIN EC 81 MG PO TBEC
81.0000 mg | DELAYED_RELEASE_TABLET | Freq: Every day | ORAL | Status: DC
  Filled 2014-01-18 (×2): qty 1

## 2014-01-18 MED ORDER — HEPARIN (PORCINE) IN NACL 2-0.9 UNIT/ML-% IJ SOLN
INTRAMUSCULAR | Status: AC
  Filled 2014-01-18: qty 1000

## 2014-01-18 MED ORDER — HEPARIN SODIUM (PORCINE) 5000 UNIT/ML IJ SOLN
INTRAMUSCULAR | Status: AC
  Administered 2014-01-18: 5000 [IU] via SUBCUTANEOUS
  Filled 2014-01-18: qty 1

## 2014-01-18 MED ORDER — HALOPERIDOL LACTATE 5 MG/ML IJ SOLN
5.0000 mg | Freq: Four times a day (QID) | INTRAMUSCULAR | Status: DC | PRN
  Filled 2014-01-18 (×2): qty 1

## 2014-01-18 MED ORDER — PNEUMOCOCCAL VAC POLYVALENT 25 MCG/0.5ML IJ INJ
0.5000 mL | INJECTION | INTRAMUSCULAR | Status: DC
  Filled 2014-01-18: qty 0.5

## 2014-01-18 MED ORDER — ONDANSETRON HCL 4 MG/2ML IJ SOLN
4.0000 mg | Freq: Four times a day (QID) | INTRAMUSCULAR | Status: DC | PRN
  Administered 2014-01-18 – 2014-01-19 (×3): 4 mg via INTRAVENOUS
  Filled 2014-01-18 (×3): qty 2

## 2014-01-18 MED ORDER — ASPIRIN 300 MG RE SUPP
300.0000 mg | Freq: Once | RECTAL | Status: AC
  Administered 2014-01-18: 300 mg via RECTAL

## 2014-01-18 MED ORDER — INSULIN ASPART 100 UNIT/ML ~~LOC~~ SOLN
0.0000 [IU] | Freq: Three times a day (TID) | SUBCUTANEOUS | Status: DC
Start: 2014-01-18 — End: 2014-01-19
  Administered 2014-01-18 (×2): 2 [IU] via SUBCUTANEOUS
  Administered 2014-01-18 – 2014-01-19 (×2): 3 [IU] via SUBCUTANEOUS

## 2014-01-18 MED ORDER — SODIUM CHLORIDE 0.9 % IV SOLN
INTRAVENOUS | Status: DC
  Administered 2014-01-18: 03:00:00 via INTRAVENOUS

## 2014-01-18 MED ORDER — TRAMADOL HCL 50 MG PO TABS
50.0000 mg | ORAL_TABLET | Freq: Four times a day (QID) | ORAL | Status: DC | PRN
  Administered 2014-01-18: 50 mg via ORAL
  Filled 2014-01-18: qty 1

## 2014-01-18 MED ORDER — OXYCODONE-ACETAMINOPHEN 5-325 MG PO TABS
1.0000 | ORAL_TABLET | ORAL | Status: DC | PRN
  Administered 2014-01-18 – 2014-01-19 (×4): 2 via ORAL
  Filled 2014-01-18 (×4): qty 2

## 2014-01-18 MED ORDER — HEPARIN SODIUM (PORCINE) 5000 UNIT/ML IJ SOLN
5000.0000 [IU] | Freq: Three times a day (TID) | INTRAMUSCULAR | Status: DC
  Administered 2014-01-18 – 2014-01-19 (×3): 5000 [IU] via SUBCUTANEOUS
  Filled 2014-01-18 (×9): qty 1

## 2014-01-18 MED ORDER — ASPIRIN 81 MG PO CHEW: 324.0000 mg | CHEWABLE_TABLET | Freq: Once | ORAL | Status: DC

## 2014-01-18 MED ORDER — DIPHENHYDRAMINE HCL 50 MG/ML IJ SOLN
INTRAMUSCULAR | Status: AC
  Filled 2014-01-18: qty 1

## 2014-01-18 MED ORDER — INSULIN ASPART 100 UNIT/ML ~~LOC~~ SOLN: 0.0000 [IU] | Freq: Every day | SUBCUTANEOUS | Status: DC

## 2014-01-18 MED ORDER — ADULT MULTIVITAMIN W/MINERALS CH
1.0000 | ORAL_TABLET | Freq: Every day | ORAL | Status: DC
  Filled 2014-01-18: qty 1

## 2014-01-18 MED ORDER — SODIUM CHLORIDE 0.9 % IV SOLN
INTRAVENOUS | Status: DC
  Administered 2014-01-18: 1000 mL via INTRAVENOUS

## 2014-01-18 MED ORDER — NITROGLYCERIN 1 MG/10 ML FOR IR/CATH LAB
INTRA_ARTERIAL | Status: AC
  Filled 2014-01-18: qty 10

## 2014-01-18 NOTE — Plan of Care (Signed)
Problem: Consults Goal: General Medical Patient Education See Patient Education Module for specific education. Outcome: Completed/Met Date Met:  01/18/14 Patient post cardiac catheterization. Instructed on extremities limitations. After several reminders , patient able to verbalize understanding.  Problem: Phase I Progression Outcomes Goal: Pain controlled with appropriate interventions Outcome: Progressing Patient reports c/o of " soreness " in his chest. Reports no other c/o of pain.
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
## 2014-01-18 NOTE — CV Procedure (Signed)
Nicolas [name] is a 38 y.o. male    [id number]  [id number] LOCATION:  FACILITY: [facility]  PHYSICIAN: Lennette Bihari, MD, [facility] Apr 08, 1976   DATE OF PROCEDURE:  01/18/2014    CARDIAC CATHETERIZATION     HISTORY:    Nicolas [name] is a 38 y.o. male by [facility] as possible "Code Stemi."  The patient was found with agonal respirations by his wife who performed transient CPR. EMS arrived and he had a stable rhythm with agonal respirations which improved with narcan and oxygen.  [facility]  ECG reveal 1-2 mm ST elevation in aVR, and mild ST segment depression in leads 1, 2, aVF, and V5 and V6.  He also a QS complex in V1, V2. He is brought to the cardiac catheterization laboratory for urgent cardiac catheterization.   PROCEDURE: Urgent cardiac catheterization: Coronary angiography, left ventriculography.  The patient was brought to the cardiac cath lab in an agitated state. He was premedicated with Benadryl 50 mg IV since he appeared to have multiple bug bites and erythematous rash on his body. His  right groin was prepped and shaved in usual sterile fashion. Xylocaine 1% was used for local anesthesia. A 5 French sheath was inserted into the R femoral artery. Diagnostic catheterizatiion was done with 5 [place] LF4, FR4, and pigtail catheters. Left ventriculography was done with 25 cc Omnipaque contrast. Hemostasis was obtained by direct manual compression. The patient tolerated the procedure well.   HEMODYNAMICS:   Central Aorta: 140/99   Left Ventricle: 140/9  ANGIOGRAPHY:  Left main: Long angiographically normal vessel, which bifurcated into a large LAD, a diminutive optional diagonal vessel, and a moderate size left circumflex coronary artery.   LAD: Large-caliber angiographically normal.  The LAD gave rise to 2 major diagonal vessels and a prominent septal perforating artery.  The vessel extended to the apex.  [place] Intermediate: Diminutive normal  vessel  Left circumflex: Moderate size vessel that gave rise to 2 marginal branches and was normal  Right coronary artery: Large, dominant normal vessel, which supplied the PDA and PLA vessel.   Left ventriculography revealed hyperdynamic LV contractility with an ejection fraction of 65%.  There were no focal, segmental wall motion abnormalities.   Total contrast: 50 cc Omnipaque  IMPRESSION:  Hyperdynamic LV contractility without focal, segmental wall motion abnormalities.  Ejection fraction 65%.  Normal coronary arteries.   Lennette Bihari, MD, [facility] 01/18/2014 2:29 AM
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
## 2014-01-18 NOTE — Progress Notes (Signed)
Patient ambulated to bathroom after bedrest was completed.  Right groin level 0, pedal pulses equal and palpable. [place], Mitzi Hansen

## 2014-01-18 NOTE — Progress Notes (Signed)
Chaplain Note: Patient to arrive for Code Stemi. No family present or notified at this time. Chaplain available at [phone].  Toni Amend, Chaplain
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
## 2014-01-19 ENCOUNTER — Inpatient Hospital Stay (HOSPITAL_COMMUNITY): Payer: Self-pay

## 2014-01-19 DIAGNOSIS — I10 Essential (primary) hypertension: Secondary | ICD-10-CM

## 2014-01-19 DIAGNOSIS — R4182 Altered mental status, unspecified: Principal | ICD-10-CM

## 2014-01-19 DIAGNOSIS — I2589 Other forms of chronic ischemic heart disease: Secondary | ICD-10-CM

## 2014-01-19 DIAGNOSIS — R0789 Other chest pain: Secondary | ICD-10-CM | POA: Diagnosis present

## 2014-01-19 LAB — CBC WITH DIFFERENTIAL/PLATELET
BASOS PCT: 0 % (ref 0–1)
Basophils Absolute: 0 10*3/uL (ref 0.0–0.1)
EOS ABS: 0.4 10*3/uL (ref 0.0–0.7)
Eosinophils Relative: 2 % (ref 0–5)
HCT: 46.1 % (ref 39.0–52.0)
Hemoglobin: 16.1 g/dL (ref 13.0–17.0)
Lymphocytes Relative: 16 % (ref 12–46)
Lymphs Abs: 3 10*3/uL (ref 0.7–4.0)
MCH: 31.9 pg (ref 26.0–34.0)
MCHC: 34.9 g/dL (ref 30.0–36.0)
MCV: 91.3 fL (ref 78.0–100.0)
Monocytes Absolute: 0.9 10*3/uL (ref 0.1–1.0)
Monocytes Relative: 5 % (ref 3–12)
NEUTROS PCT: 77 % (ref 43–77)
Neutro Abs: 14.2 10*3/uL — ABNORMAL HIGH (ref 1.7–7.7)
PLATELETS: 264 10*3/uL (ref 150–400)
RBC: 5.05 MIL/uL (ref 4.22–5.81)
RDW: 14.7 % (ref 11.5–15.5)
WBC: 18.4 10*3/uL — AB (ref 4.0–10.5)

## 2014-01-19 LAB — GLUCOSE, CAPILLARY: Glucose-Capillary: 160 mg/dL — ABNORMAL HIGH (ref 70–99)

## 2014-01-19 LAB — POCT ACTIVATED CLOTTING TIME: ACTIVATED CLOTTING TIME: 174 s

## 2014-01-19 MED ORDER — TRAMADOL HCL 50 MG PO TABS: 50.0000 mg | ORAL_TABLET | Freq: Four times a day (QID) | ORAL | Status: DC | PRN

## 2014-01-19 NOTE — Progress Notes (Addendum)
Patient Name: Nicolas [name] Date of Encounter: 01/19/2014  Principal Problem:   Altered mental state Active Problems:   Essential hypertension, benign   Musculoskeletal chest pain    Patient Profile: 38 yo male w/ hx HTN, tob, but no previous CAD was admitted 08/09 w/ AMS, had apnea wife attempted CPR. EMS bagged but had a perfusing rhythm, no change in condition w/ Narcan. Had abnl ECG. Cath w/out CAD, EF nl. ?injestion of a pill at a party.  SUBJECTIVE: Chest pain with deep breathing and cough, otherwise OK.  OBJECTIVE Filed Vitals:   01/18/14 1354 01/18/14 1854 01/18/14 2050 01/19/14 0417  BP: 170/81 161/90 158/99 161/94  Pulse: 116 100 105 113  Temp: 99.7 F (37.6 C) 99.4 F (37.4 C) 99.8 F (37.7 C) 99 F (37.2 C)  TempSrc: Oral Oral Oral Oral  Resp: 20 20 20    Height:      Weight:      SpO2: 98% 97% 95% 95%    Intake/Output Summary (Last 24 hours) at 01/19/14 0731 Last data filed at 01/18/14 1200  Gross per 24 hour  Intake   1170 ml  Output    400 ml  Net    770 ml   Filed Weights   01/18/14 0315  Weight: 269 lb 6.4 oz (122.2 kg)    PHYSICAL EXAM General: Well developed, well nourished, male in no acute distress. Head: Normocephalic, atraumatic.  Neck: Supple without bruits, JVD not elevated Lungs:  Resp regular and unlabored, very few rales. Heart: RRR, S1, S2, no S3, S4, or murmur; no rub. Abdomen: Soft, non-tender, non-distended, BS + x 4.  Extremities: No clubbing, cyanosis, no edema.  Neuro: Alert and oriented X 3. Moves all extremities spontaneously. Psych: Normal affect.  LABS: CBC:  Recent Labs  01/18/14 0530 01/19/14 0529  WBC 33.8* 18.4*  NEUTROABS  --  14.2*  HGB 17.3* 16.1  HCT 48.1 46.1  MCV 90.1 91.3  PLT 283 264   INR:  Recent Labs  01/18/14 0117  INR 1.20   Basic Metabolic Panel:  Recent Labs  39/76/73 0117 01/18/14 0530  NA 142 144  K 4.0 3.9  CL 100 104  CO2 18* 27  GLUCOSE 240* 89  BUN 19 24*   CREATININE 1.42* 1.24  CALCIUM 9.3 9.3   Liver Function Tests:  Recent Labs  01/18/14 0117 01/18/14 0530  AST 41* 39*  ALT 43 46  ALKPHOS 139* 108  BILITOT 0.4 0.3  PROT 6.8 6.7  ALBUMIN 3.8 4.1    Recent Labs  01/18/14 0131  TROPIPOC 0.02   Hemoglobin A1C:  Recent Labs  01/18/14 0530  HGBA1C 5.2   TELE: SR, ST. Mostly sinus tach     ECG: SR, diffuse ST depression on admission has resolved.  Radiology/Studies: Ct Head Wo Contrast  01/18/2014   CLINICAL DATA:  Altered mental status. Status post cardiac catheterization.  EXAM: CT HEAD WITHOUT CONTRAST  TECHNIQUE: Contiguous axial images were obtained from the base of the skull through the vertex without intravenous contrast.  COMPARISON:  None.  FINDINGS: There is no evidence of acute infarction, mass lesion, or intra- or extra-axial hemorrhage on CT.  The posterior fossa, including the cerebellum, brainstem and fourth ventricle, is within normal limits. The third and lateral ventricles, and basal ganglia are unremarkable in appearance. The cerebral hemispheres are symmetric in appearance, with normal gray-white differentiation. No mass effect or midline shift is seen.  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 The visualized portions of the orbits are within normal limits. The paranasal sinuses and mastoid air cells are well-aerated. No significant soft tissue abnormalities are seen.  IMPRESSION: 1. No acute intracranial pathology seen on CT. 2. 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   Electronically Signed   By: Roanna RaiderJeffery  Chang M.D.   On: 01/18/2014 06:23   Dg Chest Portable 1 View  01/18/2014   CLINICAL DATA:  Myocardial infarction, chest pain  EXAM: PORTABLE CHEST - 1 VIEW  COMPARISON:  10/30/2012  FINDINGS: Heart size upper normal to mildly enlarged. Vascular pattern normal. Lungs clear.  IMPRESSION: No acute finding   Electronically Signed   By: Esperanza Heiraymond  Rubner  M.D.   On: 01/18/2014 01:46    Ct Head Wo Contrast 01/18/2014   CLINICAL DATA:  Altered mental status. Status post cardiac catheterization.  EXAM: CT HEAD WITHOUT CONTRAST  TECHNIQUE: Contiguous axial images were obtained from the base of the skull through the vertex without intravenous contrast.  COMPARISON:  None.  FINDINGS: There is no evidence of acute infarction, mass lesion, or intra- or extra-axial hemorrhage on CT.  The posterior fossa, including the cerebellum, brainstem and fourth ventricle, is within normal limits. The third and lateral ventricles, and basal ganglia are unremarkable in appearance. The cerebral hemispheres are symmetric in appearance, with normal gray-white differentiation. No mass effect or midline shift is seen.  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 The visualized portions of the orbits are within normal limits. The paranasal sinuses and mastoid air cells are well-aerated. No significant soft tissue abnormalities are seen.  IMPRESSION: 1. No acute intracranial pathology seen on CT. 2. 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   Electronically Signed   By: Roanna RaiderJeffery  Chang M.D.   On: 01/18/2014 06:23   Dg Chest Portable 1 View 01/18/2014   CLINICAL DATA:  Myocardial infarction, chest pain  EXAM: PORTABLE CHEST - 1 VIEW  COMPARISON:  10/30/2012  FINDINGS: Heart size upper normal to mildly enlarged. Vascular pattern normal. Lungs clear.  IMPRESSION: No acute finding   Electronically Signed   By: Esperanza Heiraymond  Rubner M.D.   On: 01/18/2014 01:46     Current Medications:  . aspirin  324 mg Oral Once  . aspirin EC  81 mg Oral Daily  . carvedilol  6.25 mg Oral BID WC  . heparin  5,000 Units Subcutaneous 3 times per day  . insulin aspart  0-15 Units Subcutaneous TID WC  . insulin aspart  0-5 Units Subcutaneous QHS  . multivitamin with minerals  1 tablet Oral Daily  . pneumococcal 23 valent vaccine  0.5 mL Intramuscular  Tomorrow-1000  . sodium chloride  3 mL Intravenous Q12H      ASSESSMENT AND PLAN: Active Problems:   Altered mental state - resolved    MS chest pain - prn rx    HTN - PTA on lisinopril 10 mg. Cr up a little on admission, BP running high, MD advise on d/c meds.  Plan - OK to d/c today. F/u with primary MD.  Melida QuitterSigned, Gearline Spilman , PA-C 7:31 AM 01/19/2014

## 2014-01-19 NOTE — Discharge Summary (Signed)
CARDIOLOGY DISCHARGE SUMMARY   Patient ID: Nicolas [name] MRN: [id number] DOB/AGE: 37-08-1975 37 y.o.  Admit date: 01/18/2014 Discharge date: 01/19/2014  PCP: Nicolas [name],SCOTT, Nicolas [name] Primary Cardiologist: Nicolas [name]  Primary Discharge Diagnosis:    Altered mental state Secondary Discharge Diagnosis:    Essential hypertension, benign   Musculoskeletal chest pain  Procedures: Cardiac catheterization, coronary arteriogram, left ventriculogram, head CT without contrast, chest x-ray   Hospital Course: Nicolas [name] is a 38 y.o. male with no history of CAD. He came home from a party and went to sleep. He was found with agonal respirations by his wife who performed CPR and called EMS. He had a pulsatile rhythm but had agonal respirations, then required bagging. He was given Narcan by EMS with improvement. His ECG reveals diffuse ST depression with some ST elevation. A code STEMI was called and he was brought emergently to the cath lab.  Cardiac catheterization results are below. He had no CAD and his EF is preserved. He was held overnight and hydrated.  His mental status improved and his ECG changes improved as well. His renal function had been mildly abnormal on admission and this also improved. His white count was elevated but he had no fever and his chest x-ray did not show any infiltrate. A urine drug screen was negative and a urinalysis did not show any infection.  On 08/10, he was seen by Dr. Adolm Josephhealthy and all data were reviewed. No further inpatient workup is indicated and he is considered stable for discharge, to follow up as an outpatient.  Labs:   Lab Results  Component Value Date   WBC 18.4* 01/19/2014   HGB 16.1 01/19/2014   HCT 46.1 01/19/2014   MCV 91.3 01/19/2014   PLT 264 01/19/2014     Recent Labs Lab 01/18/14 0530  NA 144  K 3.9  CL 104  CO2 27  BUN 24*  CREATININE 1.24  CALCIUM 9.3  PROT 6.7  BILITOT 0.3  ALKPHOS 108  ALT 46  AST 39*  GLUCOSE 89    Recent  Labs  01/18/14 0117  INR 1.20   Drugs of Abuse     Component Value Date/Time   LABOPIA NONE DETECTED 01/18/2014 0130   COCAINSCRNUR NONE DETECTED 01/18/2014 0130   LABBENZ NONE DETECTED 01/18/2014 0130   AMPHETMU NONE DETECTED 01/18/2014 0130   THCU NONE DETECTED 01/18/2014 0130   LABBARB NONE DETECTED 01/18/2014 0130    Urinalysis    Component Value Date/Time   COLORURINE YELLOW 03/03/2012 1906   APPEARANCEUR CLEAR 03/03/2012 1906   LABSPEC >1.030* 03/03/2012 1906   PHURINE 6.0 03/03/2012 1906   GLUCOSEU NEGATIVE 03/03/2012 1906   HGBUR LARGE* 03/03/2012 1906   BILIRUBINUR NEGATIVE 03/03/2012 1906   KETONESUR NEGATIVE 03/03/2012 1906   PROTEINUR NEGATIVE 03/03/2012 1906   UROBILINOGEN 0.2 03/03/2012 1906   NITRITE NEGATIVE 03/03/2012 1906   LEUKOCYTESUR NEGATIVE 03/03/2012 1906     Radiology: Dg Chest 2 View 01/19/2014   CLINICAL DATA:  Chest pain; recent cardiac arrest  EXAM: CHEST  2 VIEW  COMPARISON:  January 18, 2014  FINDINGS: Patchy atelectasis is noted in the left base region. Elsewhere lungs are clear. Heart is mildly enlarged with pulmonary vascularity within normal limits. No adenopathy. No bone lesions are appreciable. There is degenerative change in the thoracic spine. No pneumothorax.  IMPRESSION: Areas of left lower lobe atelectatic change. Lungs elsewhere clear. No apparent pneumothorax. Stable mild cardiac prominence.   Electronically Signed  By: Bretta Bang M.D.   On: 01/19/2014 07:35   Ct Head Wo Contrast 01/18/2014   CLINICAL DATA:  Altered mental status. Status post cardiac catheterization.  EXAM: CT HEAD WITHOUT CONTRAST  TECHNIQUE: Contiguous axial images were obtained from the base of the skull through the vertex without intravenous contrast.  COMPARISON:  None.  FINDINGS: There is no evidence of acute infarction, mass lesion, or intra- or extra-axial hemorrhage on CT.  The posterior fossa, including the cerebellum, brainstem and fourth ventricle, is within normal limits. The  third and lateral ventricles, and basal ganglia are unremarkable in appearance. The cerebral hemispheres are symmetric in appearance, with normal gray-white differentiation. No mass effect or midline shift is seen.  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 The visualized portions of the orbits are within normal limits. The paranasal sinuses and mastoid air cells are well-aerated. No significant soft tissue abnormalities are seen.  IMPRESSION: 1. No acute intracranial pathology seen on CT. 2. 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   Electronically Signed   By: Roanna Raider M.D.   On: 01/18/2014 06:23   Dg Chest Portable 1 View 01/18/2014   CLINICAL DATA:  Myocardial infarction, chest pain  EXAM: PORTABLE CHEST - 1 VIEW  COMPARISON:  10/30/2012  FINDINGS: Heart size upper normal to mildly enlarged. Vascular pattern normal. Lungs clear.  IMPRESSION: No acute finding   Electronically Signed   By: Esperanza Heir M.D.   On: 01/18/2014 01:46    Cardiac Cath: 01/18/2014 ANGIOGRAPHY:  Left main: Long angiographically normal vessel, which bifurcated into a large LAD, a diminutive optional diagonal vessel, and a moderate size left circumflex coronary artery.  LAD: Large-caliber angiographically normal. The LAD gave rise to 2 major diagonal vessels and a prominent septal perforating artery. The vessel extended to the apex.  Ramus Intermediate: Diminutive normal vessel  Left circumflex: Moderate size vessel that gave rise to 2 marginal branches and was normal  Right coronary artery: Large, dominant normal vessel, which supplied the PDA and PLA vessel.  Left ventriculography revealed hyperdynamic LV contractility with an ejection fraction of 65%. There were no focal, segmental wall motion abnormalities.  Total contrast: 50 cc Omnipaque  IMPRESSION:  Hyperdynamic LV contractility without focal, segmental wall motion abnormalities. Ejection fraction  65%.  Normal coronary arteries.   EKG: 18-Jan-2014 01:15:56 Sinus tachycardia Left axis deviation ST depression, consider ischemia, inferior and lateral leads Borderline prolonged QT interval Depression new when compared to prior Vent. rate 115 BPM PR interval 136 ms QRS duration 110 ms QT/QTc 355/491 ms P-R-T axes 76 -67 81  FOLLOW UP PLANS AND APPOINTMENTS No Known Allergies   Medication List         enalapril 10 MG tablet  Commonly known as:  VASOTEC  Take 1 tablet (10 mg total) by mouth daily.     traMADol 50 MG tablet  Commonly known as:  ULTRAM  Take 1 tablet (50 mg total) by mouth every 6 (six) hours as needed for moderate pain or severe pain.        Discharge Instructions   Diet - low sodium heart healthy    Complete by:  As directed      Increase activity slowly    Complete by:  As directed           Follow-up Information   Schedule an appointment as soon as possible for a visit with Nicolas Punt, Nicolas [name].   Specialty:  Family Medicine   Contact information:   [address] [place] [address] [phone]       Follow up with Chrystie Nose, Nicolas [name]. (As needed)    Specialty:  Cardiology   Contact information:   [address] [place] [address] [phone]       BRING ALL MEDICATIONS WITH YOU TO FOLLOW UP APPOINTMENTS  Time spent with patient to include physician time:  min Signed: Theodore Demark, PA-C 01/19/2014, 11:50 AM Co-Sign Nicolas [name]

## 2014-01-19 NOTE — Progress Notes (Signed)
Pt. Seen and examined. Agree with the NP/PA-C note as written.  Feels much better. Chest is sore. WBC count is resolving, suspect acute leukemoid reaction. CXR does not show fractures or PTX. Stable for d/c today. Follow-up with PCP in 7-10 days.  Chrystie Nose, MD, [facility] Attending Cardiologist [facility]

## 2014-01-19 NOTE — Progress Notes (Signed)
Discharge education completed by RN. Pt and spouse received a copy of discharge paperwork and confirm understanding of follow up appointments and discharge medications. Both deny any questions at this time. IV removed, site is within normal limits. Pt refused PNA vaccine at this time. Pt will discharge from the unit via wheelchair.

## 2014-01-20 ENCOUNTER — Other Ambulatory Visit: Payer: Self-pay | Admitting: Family Medicine

## 2014-01-20 ENCOUNTER — Telehealth: Payer: Self-pay | Admitting: Family Medicine

## 2014-01-20 MED ORDER — IBUPROFEN 600 MG PO TABS: 600.0000 mg | ORAL_TABLET | Freq: Three times a day (TID) | ORAL | Status: DC | PRN

## 2014-01-20 NOTE — Telephone Encounter (Signed)
Typically prescription dose of anti-inflammatory would help. Ibuprofen 600 mg 3 times a day, #30, 1 refill, may have office visit for Friday

## 2014-01-20 NOTE — Telephone Encounter (Signed)
Discussed with wife cindy. Med sent to [facility]

## 2014-01-20 NOTE — Telephone Encounter (Signed)
Pt has appt 8/14 for Hosp follow up, he was given CPR (01/18/14) on the  Way to the hosp an at his house. He states he told the hosp doc That his chest was hurting from it, they did an xray an told him  The plate was stretched, no breakage. They did not issue any  Pain med to deal with the discomfort. He is on tramadol for headaches 50 mg, but states that is not helping his chest.   Can we give him something to deal with the pain/discomfort till he  Can get in her on Friday?   [facility]

## 2014-01-22 ENCOUNTER — Encounter: Payer: Self-pay | Admitting: Cardiovascular Disease

## 2014-01-23 ENCOUNTER — Encounter: Payer: Self-pay | Admitting: Family Medicine

## 2014-01-23 ENCOUNTER — Telehealth: Payer: Self-pay | Admitting: Family Medicine

## 2014-01-23 ENCOUNTER — Ambulatory Visit (INDEPENDENT_AMBULATORY_CARE_PROVIDER_SITE_OTHER): Payer: Self-pay | Admitting: Family Medicine

## 2014-01-23 VITALS — BP 158/100 | Ht 75.0 in | Wt 257.0 lb

## 2014-01-23 DIAGNOSIS — I10 Essential (primary) hypertension: Secondary | ICD-10-CM

## 2014-01-23 DIAGNOSIS — R0789 Other chest pain: Secondary | ICD-10-CM

## 2014-01-23 DIAGNOSIS — R109 Unspecified abdominal pain: Secondary | ICD-10-CM

## 2014-01-23 DIAGNOSIS — Z0189 Encounter for other specified special examinations: Secondary | ICD-10-CM

## 2014-01-23 LAB — CBC WITH DIFFERENTIAL/PLATELET
Basophils Absolute: 0 10*3/uL (ref 0.0–0.1)
Basophils Relative: 0 % (ref 0–1)
Eosinophils Absolute: 0.3 10*3/uL (ref 0.0–0.7)
Eosinophils Relative: 2 % (ref 0–5)
HCT: 46.5 % (ref 39.0–52.0)
HEMOGLOBIN: 16.6 g/dL (ref 13.0–17.0)
Lymphocytes Relative: 20 % (ref 12–46)
Lymphs Abs: 2.7 10*3/uL (ref 0.7–4.0)
MCH: 31.7 pg (ref 26.0–34.0)
MCHC: 35.7 g/dL (ref 30.0–36.0)
MCV: 88.7 fL (ref 78.0–100.0)
MONO ABS: 0.9 10*3/uL (ref 0.1–1.0)
MONOS PCT: 7 % (ref 3–12)
Neutro Abs: 9.4 10*3/uL — ABNORMAL HIGH (ref 1.7–7.7)
Neutrophils Relative %: 71 % (ref 43–77)
Platelets: 342 10*3/uL (ref 150–400)
RBC: 5.24 MIL/uL (ref 4.22–5.81)
RDW: 13.5 % (ref 11.5–15.5)
WBC: 13.3 10*3/uL — ABNORMAL HIGH (ref 4.0–10.5)

## 2014-01-23 MED ORDER — HYDROCODONE-ACETAMINOPHEN 7.5-325 MG PO TABS: 1.0000 | ORAL_TABLET | Freq: Four times a day (QID) | ORAL | Status: DC | PRN

## 2014-01-23 MED ORDER — NYSTATIN 100000 UNIT/GM EX CREA: 1.0000 | TOPICAL_CREAM | Freq: Three times a day (TID) | CUTANEOUS | Status: DC

## 2014-01-23 MED ORDER — PROMETHAZINE HCL 25 MG PO TABS: 25.0000 mg | ORAL_TABLET | Freq: Three times a day (TID) | ORAL | Status: DC | PRN

## 2014-01-23 MED ORDER — NYSTATIN 100000 UNIT/ML MT SUSP: OROMUCOSAL | Status: DC

## 2014-01-23 NOTE — Progress Notes (Signed)
   Subjective:    Patient ID: Nicolas [name], male    DOB: [birth year], 38 y.o.   MRN: [id number]  HPI Patient is here today for ER follow up.  He passed out in the bathroom on Saturday night. He did not hit his head.   Went to the ER early Sunday morning.   They do not know what caused him to pass out. They had to to CPR on him.   His chest hurts from the CPR and his stomach is upset and nauseous.  Long discussion held with the patient I reviewed over all of his hospital records and had a long discussion with him he denies any drug use. Could have been an arrhythmia issue?    Review of Systems  Constitutional: Negative for activity change, appetite change and fatigue.  HENT: Negative for congestion.   Respiratory: Negative for cough.   Cardiovascular: Negative for chest pain.  Gastrointestinal: Negative for abdominal pain.  Endocrine: Negative for polydipsia and polyphagia.  Neurological: Negative for weakness.  Psychiatric/Behavioral: Negative for confusion.       Objective:   Physical Exam  Vitals reviewed. Constitutional: He appears well-nourished. No distress.  Cardiovascular: Normal rate, regular rhythm and normal heart sounds.   No murmur heard. Pulmonary/Chest: Effort normal and breath sounds normal. No respiratory distress.  Musculoskeletal: He exhibits no edema.  Lymphadenopathy:    He has no cervical adenopathy.  Neurological: He is alert.  Psychiatric: His behavior is normal.   Mild abdominal tenderness. Complains of some nausea. No guarding or rebound.       Assessment & Plan:  He had a near death experience. Could have been any arrhythmia issue. I think it is reasonable to consider doing a 30 day event monitor. We'll discuss with cardiology  HTN good control  Pain and discomfort omeprazole 20 mg check lab work. May need ultrasound. Hydrocodone prescribed for frequent use  Chest wall pain related to CPR hydrocodone for home use only should gradually  get better  Oral thrush nystatin ordered  Elevated WBC while in the hospital related to his CPR recheck CBC  Nystatin cream ordered  Followup 2-3 months

## 2014-01-24 LAB — HEPATIC FUNCTION PANEL
ALT: 24 U/L (ref 0–53)
AST: 17 U/L (ref 0–37)
Albumin: 4 g/dL (ref 3.5–5.2)
Alkaline Phosphatase: 96 U/L (ref 39–117)
BILIRUBIN DIRECT: 0.1 mg/dL (ref 0.0–0.3)
Indirect Bilirubin: 0.4 mg/dL (ref 0.2–1.2)
Total Bilirubin: 0.5 mg/dL (ref 0.2–1.2)
Total Protein: 6.8 g/dL (ref 6.0–8.3)

## 2014-01-24 LAB — GLUCOSE, RANDOM: Glucose, Bld: 89 mg/dL (ref 70–99)

## 2014-01-24 LAB — LIPASE: Lipase: 13 U/L (ref 0–75)

## 2014-01-24 LAB — AMYLASE: AMYLASE: 24 U/L (ref 0–105)

## 2014-01-25 NOTE — Telephone Encounter (Signed)
error 

## 2014-01-26 ENCOUNTER — Telehealth: Payer: Self-pay | Admitting: Family Medicine

## 2014-01-26 NOTE — Progress Notes (Signed)
Dr. Scott to speak with [facility] (I spoke with patient about his blood work shows normal liver function and pancreatic function. I set up ultrasound of the abdomen so we can get a good look at the gallbladder. It is scheduled for Thursday at 7:45 am. Pt is aware and verbalized understanding.)  

## 2014-01-26 NOTE — Telephone Encounter (Signed)
Calling to get results to lab work. °

## 2014-01-26 NOTE — Telephone Encounter (Signed)
Dr. Scott to speak with [facility] (I spoke with patient about his blood work shows normal liver function and pancreatic function. I set up ultrasound of the abdomen so we can get a good look at the gallbladder. It is scheduled for Thursday at 7:45 am. Pt is aware and verbalized understanding.)  

## 2014-01-26 NOTE — Addendum Note (Signed)
Addended byOneal Deputy D on: 01/26/2014 11:14 AM   Modules accepted: Orders

## 2014-01-27 ENCOUNTER — Telehealth: Payer: Self-pay | Admitting: *Deleted

## 2014-01-27 NOTE — Telephone Encounter (Signed)
Dr Lorin Picket spoke to Dr. Diona Browner at [facility]

## 2014-01-27 NOTE — Telephone Encounter (Signed)
Dr. Lorin Picket to speak with [facility] (I spoke with patient about his blood work shows normal liver function and pancreatic function. I set up ultrasound of the abdomen so we can get a good look at the gallbladder. It is scheduled for Thursday at 7:45 am. Pt is aware and verbalized understanding.)

## 2014-01-27 NOTE — Telephone Encounter (Signed)
I spoke with Dr. Diona Browner. It is felt that this patient's syncopal episode was more than likely related to vasovagal response with possible aspiration causing his severe difficulty breathing. Because of all of this they did not recommend ongoing telemetry. Await the patient's ultrasound.

## 2014-01-27 NOTE — Telephone Encounter (Signed)
Dr. Lorin Picket to speak with [facility] (I spoke with patient about his blood work shows normal liver function and pancreatic function. I set up ultrasound of the abdomen so we can get a good look at the gallbladder. It is scheduled for Thursday at 7:45 am. Pt is aware and verbalized understanding.)   Notes Recorded by [name], MD on 01/25/2014 at 10:08 AM The patient's blood work shows normal liver function and pancreatic function. I would recommend ultrasound of the abdomen so we can get a good look at the gallbladder. #2-I would like to speak with cardiology either the physician or the PA, thanks (call [facility] at [facility])

## 2014-01-29 ENCOUNTER — Ambulatory Visit (HOSPITAL_COMMUNITY): Admission: RE | Admit: 2014-01-29 | Payer: MEDICAID | Source: Ambulatory Visit

## 2014-01-30 ENCOUNTER — Inpatient Hospital Stay (HOSPITAL_COMMUNITY)
Admission: EM | Admit: 2014-01-30 | Discharge: 2014-02-01 | DRG: 440 | Disposition: A | Discharge status: Home / Self Care | Payer: Self-pay | Attending: Internal Medicine | Admitting: Internal Medicine

## 2014-01-30 ENCOUNTER — Emergency Department (HOSPITAL_COMMUNITY): Payer: Self-pay

## 2014-01-30 ENCOUNTER — Telehealth: Payer: Self-pay | Admitting: Family Medicine

## 2014-01-30 ENCOUNTER — Encounter (HOSPITAL_COMMUNITY): Payer: Self-pay | Admitting: Emergency Medicine

## 2014-01-30 DIAGNOSIS — Z6830 Body mass index (BMI) 30.0-30.9, adult: Secondary | ICD-10-CM

## 2014-01-30 DIAGNOSIS — Z87442 Personal history of urinary calculi: Secondary | ICD-10-CM

## 2014-01-30 DIAGNOSIS — R0789 Other chest pain: Secondary | ICD-10-CM

## 2014-01-30 DIAGNOSIS — E669 Obesity, unspecified: Secondary | ICD-10-CM | POA: Diagnosis present

## 2014-01-30 DIAGNOSIS — Z7289 Other problems related to lifestyle: Secondary | ICD-10-CM | POA: Diagnosis present

## 2014-01-30 DIAGNOSIS — K858 Other acute pancreatitis without necrosis or infection: Secondary | ICD-10-CM

## 2014-01-30 DIAGNOSIS — F17200 Nicotine dependence, unspecified, uncomplicated: Secondary | ICD-10-CM | POA: Diagnosis present

## 2014-01-30 DIAGNOSIS — I10 Essential (primary) hypertension: Secondary | ICD-10-CM | POA: Diagnosis present

## 2014-01-30 DIAGNOSIS — K852 Alcohol induced acute pancreatitis without necrosis or infection: Secondary | ICD-10-CM

## 2014-01-30 DIAGNOSIS — Z803 Family history of malignant neoplasm of breast: Secondary | ICD-10-CM

## 2014-01-30 DIAGNOSIS — R634 Abnormal weight loss: Secondary | ICD-10-CM

## 2014-01-30 DIAGNOSIS — K859 Acute pancreatitis without necrosis or infection, unspecified: Principal | ICD-10-CM

## 2014-01-30 DIAGNOSIS — Z789 Other specified health status: Secondary | ICD-10-CM | POA: Diagnosis present

## 2014-01-30 DIAGNOSIS — R110 Nausea: Secondary | ICD-10-CM

## 2014-01-30 LAB — URINALYSIS, ROUTINE W REFLEX MICROSCOPIC
Bilirubin Urine: NEGATIVE
GLUCOSE, UA: NEGATIVE mg/dL
HGB URINE DIPSTICK: NEGATIVE
KETONES UR: NEGATIVE mg/dL
Leukocytes, UA: NEGATIVE
Nitrite: NEGATIVE
PROTEIN: NEGATIVE mg/dL
Specific Gravity, Urine: 1.02 (ref 1.005–1.030)
Urobilinogen, UA: 2 mg/dL — ABNORMAL HIGH (ref 0.0–1.0)
pH: 6 (ref 5.0–8.0)

## 2014-01-30 LAB — COMPREHENSIVE METABOLIC PANEL
ALK PHOS: 131 U/L — AB (ref 39–117)
ALT: 32 U/L (ref 0–53)
ANION GAP: 13 (ref 5–15)
AST: 18 U/L (ref 0–37)
Albumin: 3.7 g/dL (ref 3.5–5.2)
BILIRUBIN TOTAL: 0.3 mg/dL (ref 0.3–1.2)
BUN: 17 mg/dL (ref 6–23)
CHLORIDE: 100 meq/L (ref 96–112)
CO2: 27 mEq/L (ref 19–32)
Calcium: 9.9 mg/dL (ref 8.4–10.5)
Creatinine, Ser: 0.83 mg/dL (ref 0.50–1.35)
GLUCOSE: 111 mg/dL — AB (ref 70–99)
POTASSIUM: 4.3 meq/L (ref 3.7–5.3)
Sodium: 140 mEq/L (ref 137–147)
Total Protein: 7.5 g/dL (ref 6.0–8.3)

## 2014-01-30 LAB — CBC WITH DIFFERENTIAL/PLATELET
Basophils Absolute: 0 10*3/uL (ref 0.0–0.1)
Basophils Relative: 0 % (ref 0–1)
Eosinophils Absolute: 0.1 10*3/uL (ref 0.0–0.7)
Eosinophils Relative: 1 % (ref 0–5)
HEMATOCRIT: 44.9 % (ref 39.0–52.0)
HEMOGLOBIN: 16.3 g/dL (ref 13.0–17.0)
LYMPHS ABS: 2.9 10*3/uL (ref 0.7–4.0)
Lymphocytes Relative: 20 % (ref 12–46)
MCH: 32.1 pg (ref 26.0–34.0)
MCHC: 36.3 g/dL — ABNORMAL HIGH (ref 30.0–36.0)
MCV: 88.4 fL (ref 78.0–100.0)
MONOS PCT: 7 % (ref 3–12)
Monocytes Absolute: 1 10*3/uL (ref 0.1–1.0)
NEUTROS ABS: 10.5 10*3/uL — AB (ref 1.7–7.7)
NEUTROS PCT: 72 % (ref 43–77)
Platelets: 430 10*3/uL — ABNORMAL HIGH (ref 150–400)
RBC: 5.08 MIL/uL (ref 4.22–5.81)
RDW: 13.2 % (ref 11.5–15.5)
WBC: 14.6 10*3/uL — ABNORMAL HIGH (ref 4.0–10.5)

## 2014-01-30 LAB — LACTIC ACID, PLASMA: Lactic Acid, Venous: 0.7 mmol/L (ref 0.5–2.2)

## 2014-01-30 LAB — LIPASE, BLOOD: Lipase: 237 U/L — ABNORMAL HIGH (ref 11–59)

## 2014-01-30 MED ORDER — ACETAMINOPHEN 650 MG RE SUPP: 650.0000 mg | Freq: Four times a day (QID) | RECTAL | Status: DC | PRN

## 2014-01-30 MED ORDER — NYSTATIN 100000 UNIT/GM EX CREA
TOPICAL_CREAM | CUTANEOUS | Status: AC
  Filled 2014-01-30: qty 15

## 2014-01-30 MED ORDER — ONDANSETRON HCL 4 MG/2ML IJ SOLN
4.0000 mg | Freq: Once | INTRAMUSCULAR | Status: AC
  Administered 2014-01-30: 4 mg via INTRAVENOUS
  Filled 2014-01-30: qty 2

## 2014-01-30 MED ORDER — NYSTATIN 100000 UNIT/GM EX CREA
1.0000 "application " | TOPICAL_CREAM | Freq: Three times a day (TID) | CUTANEOUS | Status: DC
  Administered 2014-01-30 – 2014-01-31 (×4): 1 via TOPICAL
  Filled 2014-01-30: qty 15

## 2014-01-30 MED ORDER — SODIUM CHLORIDE 0.9 % IV SOLN
1000.0000 mL | INTRAVENOUS | Status: DC
  Administered 2014-01-30: 1000 mL via INTRAVENOUS

## 2014-01-30 MED ORDER — ONDANSETRON HCL 4 MG/2ML IJ SOLN: 4.0000 mg | Freq: Three times a day (TID) | INTRAMUSCULAR | Status: DC | PRN

## 2014-01-30 MED ORDER — FENTANYL CITRATE 0.05 MG/ML IJ SOLN
50.0000 ug | INTRAMUSCULAR | Status: DC | PRN
  Administered 2014-01-30: 50 ug via INTRAVENOUS
  Filled 2014-01-30: qty 2

## 2014-01-30 MED ORDER — ONDANSETRON HCL 4 MG/2ML IJ SOLN
4.0000 mg | Freq: Four times a day (QID) | INTRAMUSCULAR | Status: DC | PRN
  Administered 2014-01-30 – 2014-01-31 (×3): 4 mg via INTRAVENOUS
  Filled 2014-01-30 (×3): qty 2

## 2014-01-30 MED ORDER — ONDANSETRON HCL 4 MG PO TABS
4.0000 mg | ORAL_TABLET | Freq: Four times a day (QID) | ORAL | Status: DC | PRN
  Administered 2014-01-31: 4 mg via ORAL
  Filled 2014-01-30: qty 1

## 2014-01-30 MED ORDER — ACETAMINOPHEN 325 MG PO TABS: 650.0000 mg | ORAL_TABLET | Freq: Four times a day (QID) | ORAL | Status: DC | PRN

## 2014-01-30 MED ORDER — SODIUM CHLORIDE 0.9 % IV SOLN
1000.0000 mL | Freq: Once | INTRAVENOUS | Status: AC
  Administered 2014-01-30: 1000 mL via INTRAVENOUS

## 2014-01-30 MED ORDER — IOHEXOL 300 MG/ML  SOLN
50.0000 mL | Freq: Once | INTRAMUSCULAR | Status: AC | PRN
  Administered 2014-01-30: 50 mL via ORAL

## 2014-01-30 MED ORDER — FENTANYL CITRATE 0.05 MG/ML IJ SOLN
50.0000 ug | Freq: Once | INTRAMUSCULAR | Status: AC
  Administered 2014-01-30: 50 ug via INTRAVENOUS
  Filled 2014-01-30: qty 2

## 2014-01-30 MED ORDER — AMLODIPINE BESYLATE 5 MG PO TABS
10.0000 mg | ORAL_TABLET | Freq: Every day | ORAL | Status: DC
  Administered 2014-01-31 – 2014-02-01 (×2): 10 mg via ORAL
  Filled 2014-01-30 (×2): qty 2

## 2014-01-30 MED ORDER — SODIUM CHLORIDE 0.9 % IV SOLN
INTRAVENOUS | Status: DC
  Administered 2014-01-31 – 2014-02-01 (×4): via INTRAVENOUS

## 2014-01-30 MED ORDER — DOCUSATE SODIUM 100 MG PO CAPS: 100.0000 mg | ORAL_CAPSULE | Freq: Every day | ORAL | Status: DC | PRN

## 2014-01-30 MED ORDER — FENTANYL CITRATE 0.05 MG/ML IJ SOLN
100.0000 ug | INTRAMUSCULAR | Status: DC | PRN
  Administered 2014-01-30: 50 ug via INTRAVENOUS
  Administered 2014-01-30 – 2014-02-01 (×12): 100 ug via INTRAVENOUS
  Filled 2014-01-30 (×14): qty 2

## 2014-01-30 MED ORDER — ALUM & MAG HYDROXIDE-SIMETH 200-200-20 MG/5ML PO SUSP: 30.0000 mL | Freq: Four times a day (QID) | ORAL | Status: DC | PRN

## 2014-01-30 MED ORDER — PROMETHAZINE HCL 25 MG/ML IJ SOLN
25.0000 mg | Freq: Four times a day (QID) | INTRAMUSCULAR | Status: DC | PRN
  Administered 2014-01-30 – 2014-02-01 (×3): 25 mg via INTRAVENOUS
  Filled 2014-01-30 (×3): qty 1

## 2014-01-30 NOTE — Telephone Encounter (Signed)
Patient is currently in the ER. 

## 2014-01-30 NOTE — Telephone Encounter (Signed)
Patient was scheduled for U/S of abdomen yesterday, but he did not go because he felt bad. He is still experiencing bad stomach pains and nausea. Would like to know what he should do?

## 2014-01-30 NOTE — ED Provider Notes (Signed)
CSN: [id number]     Arrival date & time 01/30/14  1224 History  This chart was scribed for Ward GivensIva L Kimblery Diop, MD by Karle PlumberJennifer Tensley, ED Scribe. This patient was seen in room APA09/APA09 and the patient's care was started at 1:52 PM.  Chief Complaint  Patient presents with  . Abdominal Pain   Patient is a 38 y.o. male presenting with abdominal pain. The history is provided by the patient. No language interpreter was used.  Abdominal Pain Associated symptoms: chills and nausea   Associated symptoms: no diarrhea, no fever and no vomiting    HPI Comments:  Nicolas [name] is a 38 y.o. male who presents to the Emergency Department complaining of waxing and waning sharp abdominal pain onset approximately two weeks ago. He reports associated chills and nausea and states he has lost approximately 18 pounds in the last two weeks due to decreased appetite and inability to eat. He denies any alleviating factors. He reports eating makes the pain worse stating all he has eaten is crackers and ginger ale. He denies fever, vomiting or diarrhea. He reports having CPR two weeks ago and does not remember exactly what happened. He states his wife found him unresponsive in the bathroom after he was feeling nauseous. He states she found him " blue and foaming at the mouth" but denies any seizure-like activity. He states when he woke in the ICU he had this pain and it has not resolved since.     Pt states he smokes about 1.5 PPD. 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 He reports family h/o heart problems (FOP has had heart surgery) and cancer (his mother died at age 37 from breast cancer). He denies any illicit drug use.  Pt's PCP is Dr. Gerda DissLuking.   Past Medical History  Diagnosis Date  . Hypertension   . Kidney stone    Past Surgical History  Procedure Laterality Date  . Cardiac catheterization     Family History  Problem Relation Age of Onset  . Cancer Mother     Breast   History  Substance Use Topics  .  Smoking status: Current Every Day Smoker -- 1.50 packs/day for 10 years    Types: Cigarettes  . Smokeless tobacco: Never Used  . Alcohol Use: No  lives at home Lives with spouse Self employed  Review of Systems  Constitutional: Positive for chills and appetite change. Negative for fever.  Gastrointestinal: Positive for nausea and abdominal pain. Negative for vomiting and diarrhea.  All other systems reviewed and are negative.   Allergies  Review of patient's allergies indicates no known allergies.  Home Medications   Prior to Admission medications   Medication Sig Start Date End Date Taking? Authorizing Provider  enalapril (VASOTEC) 10 MG tablet TAKE (1) TABLET BY MOUTH ONCE DAILY FOR HIGH BLOOD PRESSURE. 01/20/14  Yes Babs SciaraScott A Luking, MD  HYDROcodone-acetaminophen (NORCO) 7.5-325 MG per tablet Take 1 tablet by mouth every 6 (six) hours as needed. 01/23/14  Yes Babs SciaraScott A Luking, MD  ibuprofen (ADVIL,MOTRIN) 600 MG tablet Take 1 tablet (600 mg total) by mouth 3 (three) times daily as needed. 01/20/14  Yes Babs SciaraScott A Luking, MD  nystatin cream (MYCOSTATIN) Apply 1 application topically 3 (three) times daily. Apply tid groin area prn 01/23/14  Yes Babs SciaraScott A Luking, MD  promethazine (PHENERGAN) 25 MG tablet Take 1 tablet (25 mg total) by mouth every 8 (eight) hours as needed for nausea or vomiting. 01/23/14  Yes Babs SciaraScott A Luking, MD  traMADol (ULTRAM) 50 MG tablet Take 1 tablet (50 mg total) by mouth every 6 (six) hours as needed for moderate pain or severe pain. 01/19/14  Yes Rhonda G Barrett, PA-C   Triage Vitals: BP 162/94  Pulse 101  Temp(Src) 98.3 F (36.8 C) (Oral)  Resp 18  Ht 6\' 3"  (1.905 m)  Wt 257 lb (116.574 kg)  BMI 32.12 kg/m2  SpO2 98%  Vital signs normal except tachycardia   Physical Exam  Nursing note and vitals reviewed. Constitutional: He is oriented to person, place, and time. He appears well-developed and well-nourished.  Non-toxic appearance. He does not appear ill. No  distress.  HENT:  Head: Normocephalic and atraumatic.  Right Ear: External ear normal.  Left Ear: External ear normal.  Nose: Nose normal. No mucosal edema or rhinorrhea.  Mouth/Throat: Mucous membranes are dry. No dental abscesses or uvula swelling.  Eyes: Conjunctivae and EOM are normal. Pupils are equal, round, and reactive to light.  Neck: Normal range of motion and full passive range of motion without pain. Neck supple.  Cardiovascular: Normal rate, regular rhythm and normal heart sounds.  Exam reveals no gallop and no friction rub.   No murmur heard. Pulmonary/Chest: Effort normal and breath sounds normal. No respiratory distress. He has no wheezes. He has no rhonchi. He has no rales. He exhibits no tenderness and no crepitus.  Abdominal: Soft. Normal appearance and bowel sounds are normal. He exhibits no distension. There is tenderness in the right upper quadrant, epigastric area and suprapubic area. There is no rebound and no guarding.  Musculoskeletal: Normal range of motion. He exhibits no edema and no tenderness.  Moves all extremities well.   Neurological: He is alert and oriented to person, place, and time. He has normal strength. No cranial nerve deficit.  Skin: Skin is warm, dry and intact. No rash noted. No erythema. No pallor.  Psychiatric: He has a normal mood and affect. His speech is normal and behavior is normal. His mood appears not anxious.    ED Course  Procedures (including critical care time)  Medications  fentaNYL (SUBLIMAZE) injection 50 mcg (50 mcg Intravenous Given 01/30/14 1415)  ondansetron (ZOFRAN) injection 4 mg (4 mg Intravenous Given 01/30/14 1415)  iohexol (OMNIPAQUE) 300 MG/ML solution 50 mL (50 mLs Oral Contrast Given 01/30/14 1413)  fentaNYL (SUBLIMAZE) injection 50 mcg (50 mcg Intravenous Given 01/30/14 1629)  ondansetron (ZOFRAN) injection 4 mg (4 mg Intravenous Given 01/30/14 1629)    DIAGNOSTIC STUDIES: Oxygen Saturation is 98% on RA, normal by  my interpretation.   COORDINATION OF CARE: 2:01 PM- Will order CT scan, ultrasound and pain and nausea medication. Pt verbalizes understanding and agrees to plan.  Review of his hospital admission on August 9. Patient presented with altered mental status after being found with agonal respirations by his wife to perform CPR. EMS reported he did have a rhythm. Patient was taken to cath lab where he had an ejection fraction of 65% and normal coronary arteries.  PT given his test results, is agreeable to admission.   16:09 Dr Adrian Blackwater, admit to med-surg, team 1    Labs Review Results for orders placed during the hospital encounter of 01/30/14  CBC WITH DIFFERENTIAL      Result Value Ref Range   WBC 14.6 (*) 4.0 - 10.5 K/uL   RBC 5.08  4.22 - 5.81 MIL/uL   Hemoglobin 16.3  13.0 - 17.0 g/dL   HCT 16.1  09.6 - 04.5 %   MCV  88.4  78.0 - 100.0 fL   MCH 32.1  26.0 - 34.0 pg   MCHC 36.3 (*) 30.0 - 36.0 g/dL   RDW 05.6  97.9 - 48.0 %   Platelets 430 (*) 150 - 400 K/uL   Neutrophils Relative % 72  43 - 77 %   Neutro Abs 10.5 (*) 1.7 - 7.7 K/uL   Lymphocytes Relative 20  12 - 46 %   Lymphs Abs 2.9  0.7 - 4.0 K/uL   Monocytes Relative 7  3 - 12 %   Monocytes Absolute 1.0  0.1 - 1.0 K/uL   Eosinophils Relative 1  0 - 5 %   Eosinophils Absolute 0.1  0.0 - 0.7 K/uL   Basophils Relative 0  0 - 1 %   Basophils Absolute 0.0  0.0 - 0.1 K/uL  COMPREHENSIVE METABOLIC PANEL      Result Value Ref Range   Sodium 140  137 - 147 mEq/L   Potassium 4.3  3.7 - 5.3 mEq/L   Chloride 100  96 - 112 mEq/L   CO2 27  19 - 32 mEq/L   Glucose, Bld 111 (*) 70 - 99 mg/dL   BUN 17  6 - 23 mg/dL   Creatinine, Ser 1.65  0.50 - 1.35 mg/dL   Calcium 9.9  8.4 - 53.7 mg/dL   Total Protein 7.5  6.0 - 8.3 g/dL   Albumin 3.7  3.5 - 5.2 g/dL   AST 18  0 - 37 U/L   ALT 32  0 - 53 U/L   Alkaline Phosphatase 131 (*) 39 - 117 U/L   Total Bilirubin 0.3  0.3 - 1.2 mg/dL   GFR calc non Af Amer >90  >90 mL/min   GFR calc Af  Amer >90  >90 mL/min   Anion gap 13  5 - 15  LIPASE, BLOOD      Result Value Ref Range   Lipase 237 (*) 11 - 59 U/L  LACTIC ACID, PLASMA      Result Value Ref Range   Lactic Acid, Venous 0.7  0.5 - 2.2 mmol/L  URINALYSIS, ROUTINE W REFLEX MICROSCOPIC      Result Value Ref Range   Color, Urine YELLOW  YELLOW   APPearance CLEAR  CLEAR   Specific Gravity, Urine 1.020  1.005 - 1.030   pH 6.0  5.0 - 8.0   Glucose, UA NEGATIVE  NEGATIVE mg/dL   Hgb urine dipstick NEGATIVE  NEGATIVE   Bilirubin Urine NEGATIVE  NEGATIVE   Ketones, ur NEGATIVE  NEGATIVE mg/dL   Protein, ur NEGATIVE  NEGATIVE mg/dL   Urobilinogen, UA 2.0 (*) 0.0 - 1.0 mg/dL   Nitrite NEGATIVE  NEGATIVE   Leukocytes, UA NEGATIVE  NEGATIVE    Laboratory interpretation all normal except elevated lipase, persistent leukocytosis     Imaging Review Ct Abdomen Pelvis W Contrast  01/30/2014   CLINICAL DATA:  Upper abdominal pain since undergoing CPR 2 weeks ago.  EXAM: CT ABDOMEN AND PELVIS WITH CONTRAST  TECHNIQUE: Multidetector CT imaging of the abdomen and pelvis was performed using the standard protocol following bolus administration of intravenous contrast.  CONTRAST:  50 mL OMNIPAQUE IOHEXOL 300 MG/ML  SOLN  COMPARISON:  CT abdomen and pelvis 03/03/2012 and 01/10/2010.  FINDINGS: Lung bases are clear.  No pleural or pericardial effusion.  The gallbladder, liver, spleen, pancreas and right kidney are unremarkable. Calcified adrenal glands are consistent with prior infection or hemorrhage. Tiny exophytic lesion off the upper  pole left kidney is likely a cyst and unchanged. The stomach, small and large bowel and appendix all appear normal. No fracture or worrisome bony lesion is seen. Small sclerotic lesion in the proximal right femur is unchanged and consistent with a bone island.  IMPRESSION: No acute finding.  Stable compared to prior exam.   Electronically Signed   By: Drusilla Kanner M.D.   On: 01/30/2014 15:26   US Abdomen  Limited  01/30/2014   CLINICAL DATA:  Abdominal pain for 2 weeks.  EXAM: US ABDOMEN LIMITED - RIGHT UPPER QUADRANT  COMPARISON:  CT abdomen and pelvis 03/03/2012.  FINDINGS: Gallbladder:  No gallstones or wall thickening visualized. No sonographic Murphy sign noted.  Common bile duct:  Diameter: 0.3 cm.  Liver:  No focal lesion identified. Within normal limits in parenchymal echogenicity.  IMPRESSION: Negative for gallstones.  Negative examination.   Electronically Signed   By: Drusilla Kanner M.D.   On: 01/30/2014 14:56   Dg Chest 2 View  01/19/2014   CLINICAL DATA:  Chest pain; recent cardiac arrest  x.  IMPRESSION: Areas of left lower lobe atelectatic change. Lungs elsewhere clear. No apparent pneumothorax. Stable mild cardiac prominence.   Electronically Signed   By: Bretta Bang M.D.   On: 01/19/2014 07:35   Ct Head Wo Contrast  01/18/2014   CLINICAL DATA:  Altered mental status. Status post cardiac catheterization. IMPRESSION: 1. No acute intracranial pathology seen on CT. 2. 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   Electronically Signed   By: Roanna Raider M.D.   On: 01/18/2014 06:23   Dg Chest Portable 1 View  01/18/2014   CLINICAL DATA:  Myocardial infarction, chest pain.  IMPRESSION: No acute finding   Electronically Signed   By: Esperanza Heir M.D.   On: 01/18/2014 01:46      EKG Interpretation   Date/Time:  Friday January 30 2014 12:33:11 EDT Ventricular Rate:  103 PR Interval:  140 QRS Duration: 92 QT Interval:  342 QTC Calculation: 448 R Axis:   71 Text Interpretation:  Sinus tachycardia Otherwise normal ECG No  significant change since last tracing 18 Jan 2014 Confirmed by Wilene Pharo   MD-I, Jaelen Soth ([id number]) on 01/30/2014 3:23:29 PM      MDM  Pt presents with acute pancreatitis since he had an arrest, ? Traumatic from CPR performed by his wife.    Final diagnoses:  Other acute pancreatitis  Nausea  Weight loss    Plan admission  Devoria Albe, MD, FACEP   I  personally performed the services described in this documentation, which was scribed in my presence. The recorded information has been reviewed and considered.     Ward Givens, MD 01/30/14 2125

## 2014-01-30 NOTE — ED Notes (Signed)
Post CPR x 2 wks ago - reports mid abd pain since with n/v.  Denies diarrhea.

## 2014-01-30 NOTE — Care Management Note (Signed)
ED/CM noted patient did not have health insurance and/or PCP listed in the computer.  Patient was given the [place] resource handout with information on the clinics, food pantries, and the handout for new health insurance sign-up.  Patient expressed appreciation for information received. A Rx discount card was given, also.  

## 2014-01-30 NOTE — Telephone Encounter (Signed)
Plain and simple he needs to do the U/S. Reschedule, use omeprazole daily and otc Maalox, if worse needs follow up ov ( go to er if emergency) may end up needing ref to GI BUT he needs to do the u/s. At the very least do the above, do the U/S and f/u OV

## 2014-01-30 NOTE — H&P (Signed)
History and Physical  Nicolas BakeMarcus [name] ZOX:[id number] DOB: 04/05/1976 DOA: 01/30/2014  Referring physician: Dr Lynelle DoctorKnapp, ED provider PCP: Lilyan PuntLUKING,SCOTT, MD   Chief Complaint: Abdominal pain  HPI: Nicolas [name] is a 38 y.o. male with a history of hypertension. He had  a [facility] due to being found in an agonal respiratory breathing. His wife started CPR and called EMS. EMS was able to reperfuse confounds ST changes. Echo to them he was called and the patient was [facility]. Heart cath showed clean coronaries with normal EF.  The patient had abdominal pain during that time and has continued to have abdominal pain, nausea, anorexia for the past 2 weeks. Food and most liquids increased his pain. The patient was able to tolerate ginger ale. Not eating improved the pain. He was evaluated by his primary care physician who believed that this was partially from CPR and gave him narcotics and antiemetics. The antiemetics didn't help with the nausea, but he did not get relief from the abdominal pain. He admits to occasional alcohol use of approximately one drink 1-2 times a month. He denies any other drug use.  Review of Systems:   Pt complains of nausea, anorexia, chest pain, occasional migraine.  Pt denies any fevers, chills, vomiting, diarrhea, constipation, hematochezia, melena, hematuria, shortness of breath, cough.  Review of systems are otherwise negative  Past Medical History  Diagnosis Date  . Hypertension   . Kidney stone    Past Surgical History  Procedure Laterality Date  . Cardiac catheterization     Social History:  reports that he has been smoking Cigarettes.  He has a 15 pack-year smoking history. He has never used smokeless tobacco. He reports that he does not drink alcohol or use illicit drugs. Patient lives at home & is able to participate in activities of daily living without assistance  No Known Allergies  Family History  Problem  Relation Age of Onset  . Cancer Mother     Breast     Prior to Admission medications   Medication Sig Start Date End Date Taking? Authorizing Provider  enalapril (VASOTEC) 10 MG tablet TAKE (1) TABLET BY MOUTH ONCE DAILY FOR HIGH BLOOD PRESSURE. 01/20/14  Yes Babs SciaraScott A Luking, MD  HYDROcodone-acetaminophen (NORCO) 7.5-325 MG per tablet Take 1 tablet by mouth every 6 (six) hours as needed. 01/23/14  Yes Babs SciaraScott A Luking, MD  ibuprofen (ADVIL,MOTRIN) 600 MG tablet Take 1 tablet (600 mg total) by mouth 3 (three) times daily as needed. 01/20/14  Yes Babs SciaraScott A Luking, MD  nystatin cream (MYCOSTATIN) Apply 1 application topically 3 (three) times daily. Apply tid groin area prn 01/23/14  Yes Babs SciaraScott A Luking, MD  promethazine (PHENERGAN) 25 MG tablet Take 1 tablet (25 mg total) by mouth every 8 (eight) hours as needed for nausea or vomiting. 01/23/14  Yes Babs SciaraScott A Luking, MD  traMADol (ULTRAM) 50 MG tablet Take 1 tablet (50 mg total) by mouth every 6 (six) hours as needed for moderate pain or severe pain. 01/19/14  Yes Joline Salthonda G Barrett, PA-C    Physical Exam: BP 146/83  Pulse 88  Temp(Src) 98.3 F (36.8 C) (Oral)  Resp 18  Ht 6\' 3"  (1.905 m)  Wt 116.574 kg (257 lb)  BMI 32.12 kg/m2  SpO2 97%  General:  Young Caucasian male who is awake, alert, and oriented x3. No acute cardiopulmonary distress. Eyes: Pupils equal, round, reactive to light. Extraocular muscles are intact. Sclerae anicteric and noninjected.  ENT: External auditory canals are patent and tympanic membranes reflect a good cone of light. Oropharynx shows moist mucosal membranes with no mucosal lesions. Neck: Supple and without lymphadenopathy. No carotid bruit. There is no masses palpated. Cardiovascular: Regular rate with normal S1-S2 sounds. No murmurs, rubs, gallops. No JVD. Respiratory: Good respiratory efforts. Clear to auscultation bilaterally with no wheezes, rales, rhonchi. Abdomen: Soft, nondistended. Appropriate bowel sounds.  Exquisitely tender in the epigastric and left upper quadrant.  There is rebound tenderness and guarding. Skin: Warm to touch with 2 posterior cells pedis and radial pulses. No peripheral edema Musculoskeletal: No arthralgias or myalgias. Psychiatric: Appropriate insight and judgment. Neurologic: Cranial nerves II through XII are grossly intact. There is no focal neurological deficit observed.           Labs on Admission:  Basic Metabolic Panel:  Recent Labs Lab 01/30/14 1250  NA 140  K 4.3  CL 100  CO2 27  GLUCOSE 111*  BUN 17  CREATININE 0.83  CALCIUM 9.9   Liver Function Tests:  Recent Labs Lab 01/30/14 1250  AST 18  ALT 32  ALKPHOS 131*  BILITOT 0.3  PROT 7.5  ALBUMIN 3.7    Recent Labs Lab 01/30/14 1426  LIPASE 237*   No results found for this basename: AMMONIA,  in the last 168 hours CBC:  Recent Labs Lab 01/30/14 1250  WBC 14.6*  NEUTROABS 10.5*  HGB 16.3  HCT 44.9  MCV 88.4  PLT 430*   Cardiac Enzymes: No results found for this basename: CKTOTAL, CKMB, CKMBINDEX, TROPONINI,  in the last 168 hours  BNP (last 3 results) No results found for this basename: PROBNP,  in the last 8760 hours CBG: No results found for this basename: GLUCAP,  in the last 168 hours  Radiological Exams on Admission: Ct Abdomen Pelvis W Contrast  01/30/2014   CLINICAL DATA:  Upper abdominal pain since undergoing CPR 2 weeks ago.  EXAM: CT ABDOMEN AND PELVIS WITH CONTRAST  TECHNIQUE: Multidetector CT imaging of the abdomen and pelvis was performed using the standard protocol following bolus administration of intravenous contrast.  CONTRAST:  50 mL OMNIPAQUE IOHEXOL 300 MG/ML  SOLN  COMPARISON:  CT abdomen and pelvis 03/03/2012 and 01/10/2010.  FINDINGS: Lung bases are clear.  No pleural or pericardial effusion.  The gallbladder, liver, spleen, pancreas and right kidney are unremarkable. Calcified adrenal glands are consistent with prior infection or hemorrhage. Tiny exophytic  lesion off the upper pole left kidney is likely a cyst and unchanged. The stomach, small and large bowel and appendix all appear normal. No fracture or worrisome bony lesion is seen. Small sclerotic lesion in the proximal right femur is unchanged and consistent with a bone island.  IMPRESSION: No acute finding.  Stable compared to prior exam.   Electronically Signed   By: Drusilla Kanner M.D.   On: 01/30/2014 15:26   US Abdomen Limited  01/30/2014   CLINICAL DATA:  Abdominal pain for 2 weeks.  EXAM: US ABDOMEN LIMITED - RIGHT UPPER QUADRANT  COMPARISON:  CT abdomen and pelvis 03/03/2012.  FINDINGS: Gallbladder:  No gallstones or wall thickening visualized. No sonographic Murphy sign noted.  Common bile duct:  Diameter: 0.3 cm.  Liver:  No focal lesion identified. Within normal limits in parenchymal echogenicity.  IMPRESSION: Negative for gallstones.  Negative examination.   Electronically Signed   By: Drusilla Kanner M.D.   On: 01/30/2014 14:56    EKG: Independently reviewed. Sinus tachycardia with a rate of  103.  Normal intervals. No ST changes.  Assessment/Plan Present on Admission:  . Acute pancreatitis . Pancreatitis, acute  #1 acute pancreatitis Patient denies this extensive alcohol use. No recent triglyceride levels to rule out triglyceride-induced pancreatitis The patient is on enalapril which is a class IA medication for drug-induced pancreatitis Admit N.p.o. IV fluids at 250 mL per hour Hold enalapril Check fasting lipids tomorrow morning Repeat lipase in the morning  #2 hypertension Hold enalapril Start amlodipine  Consultants: None  Code Status: Full  Family Communication: None   Disposition Plan: Return home after resolution of pancreatitis  Time spent: 50 minutes  Candelaria Celeste, DO [facility] Pager [phone]  **Disclaimer: This note may have been dictated with voice recognition software. Similar sounding words can inadvertently be transcribed and this  note may contain transcription errors which may not have been corrected upon publication of note.**

## 2014-01-30 NOTE — ED Notes (Signed)
Attempted to call report, nurse to call back. 

## 2014-01-31 DIAGNOSIS — I10 Essential (primary) hypertension: Secondary | ICD-10-CM

## 2014-01-31 DIAGNOSIS — Z7289 Other problems related to lifestyle: Secondary | ICD-10-CM | POA: Diagnosis present

## 2014-01-31 DIAGNOSIS — Z789 Other specified health status: Secondary | ICD-10-CM | POA: Diagnosis present

## 2014-01-31 LAB — BASIC METABOLIC PANEL
Anion gap: 11 (ref 5–15)
BUN: 15 mg/dL (ref 6–23)
CO2: 25 mEq/L (ref 19–32)
CREATININE: 0.85 mg/dL (ref 0.50–1.35)
Calcium: 9.5 mg/dL (ref 8.4–10.5)
Chloride: 103 mEq/L (ref 96–112)
GFR calc Af Amer: >90 mL/min (ref >90)
GLUCOSE: 90 mg/dL (ref 70–99)
POTASSIUM: 4.3 meq/L (ref 3.7–5.3)
Sodium: 139 mEq/L (ref 137–147)

## 2014-01-31 LAB — CBC
HCT: 41.6 % (ref 39.0–52.0)
HEMOGLOBIN: 14.5 g/dL (ref 13.0–17.0)
MCH: 31.3 pg (ref 26.0–34.0)
MCHC: 34.9 g/dL (ref 30.0–36.0)
MCV: 89.7 fL (ref 78.0–100.0)
Platelets: 408 10*3/uL — ABNORMAL HIGH (ref 150–400)
RBC: 4.64 MIL/uL (ref 4.22–5.81)
RDW: 13.4 % (ref 11.5–15.5)
WBC: 16 10*3/uL — ABNORMAL HIGH (ref 4.0–10.5)

## 2014-01-31 LAB — LIPID PANEL
CHOL/HDL RATIO: 5.1 ratio
CHOLESTEROL: 142 mg/dL (ref 0–200)
HDL: 28 mg/dL — ABNORMAL LOW (ref >39)
LDL Cholesterol: 94 mg/dL (ref 0–99)
Triglycerides: 98 mg/dL (ref <150)
VLDL: 20 mg/dL (ref 0–40)

## 2014-01-31 LAB — LIPASE, BLOOD: Lipase: 72 U/L — ABNORMAL HIGH (ref 11–59)

## 2014-01-31 MED ORDER — PANTOPRAZOLE SODIUM 40 MG IV SOLR
40.0000 mg | Freq: Two times a day (BID) | INTRAVENOUS | Status: DC
  Administered 2014-01-31 – 2014-02-01 (×3): 40 mg via INTRAVENOUS
  Filled 2014-01-31 (×3): qty 40

## 2014-01-31 NOTE — Progress Notes (Signed)
[facility] PROGRESS NOTE  Seth BakeMarcus Marion XBJ:[id number] DOB: Apr 18, 1976 DOA: 01/30/2014 PCP: Lilyan PuntLUKING,SCOTT, MD    Code Status: Full code Family Communication: Discussed with his wife Disposition Plan: Discharge to home when clinically appropriate, possibly tomorrow   Consultants:  None  Procedures:  None  Antibiotics:  None  HPI/Subjective: The patient says he feels better. He has 4/5 for abdominal pain. He denies nausea vomiting. He denies abusing alcohol and does not drink alcohol every day.  Objective: Filed Vitals:   01/31/14 0543  BP: 145/92  Pulse: 84  Temp: 98.4 F (36.9 C)  Resp: 20    Intake/Output Summary (Last 24 hours) at 01/31/14 1053 Last data filed at 01/31/14 1038  Gross per 24 hour  Intake    240 ml  Output      0 ml  Net    240 ml   Filed Weights   01/30/14 1226 01/30/14 1817  Weight: 116.574 kg (257 lb) 112.1 kg (247 lb 2.2 oz)    Exam:   General:  No acute distress.  Cardiovascular: S1, S2, with no murmurs rubs or gallops.  Respiratory: Clear to auscultation bilaterally.  Abdomen: Positive bowel sounds, soft, mild tenderness in the epigastrium without hepatosplenomegaly or distention.  Musculoskeletal: No acute hot joints. Pedal pulses palpable. No pedal edema.   Data Reviewed: Basic Metabolic Panel:  Recent Labs Lab 01/30/14 1250 01/31/14 0532  NA 140 139  K 4.3 4.3  CL 100 103  CO2 27 25  GLUCOSE 111* 90  BUN 17 15  CREATININE 0.83 0.85  CALCIUM 9.9 9.5   Liver Function Tests:  Recent Labs Lab 01/30/14 1250  AST 18  ALT 32  ALKPHOS 131*  BILITOT 0.3  PROT 7.5  ALBUMIN 3.7    Recent Labs Lab 01/30/14 1426 01/31/14 0532  LIPASE 237* 72*   No results found for this basename: AMMONIA,  in the last 168 hours CBC:  Recent Labs Lab 01/30/14 1250 01/31/14 0532  WBC 14.6* 16.0*  NEUTROABS 10.5*  --   HGB 16.3 14.5  HCT 44.9 41.6  MCV 88.4 89.7  PLT 430* 408*   Cardiac Enzymes: No results  found for this basename: CKTOTAL, CKMB, CKMBINDEX, TROPONINI,  in the last 168 hours BNP (last 3 results) No results found for this basename: PROBNP,  in the last 8760 hours CBG: No results found for this basename: GLUCAP,  in the last 168 hours  No results found for this or any previous visit (from the past 240 hour(s)).   Studies: Ct Abdomen Pelvis W Contrast  01/30/2014   CLINICAL DATA:  Upper abdominal pain since undergoing CPR 2 weeks ago.  EXAM: CT ABDOMEN AND PELVIS WITH CONTRAST  TECHNIQUE: Multidetector CT imaging of the abdomen and pelvis was performed using the standard protocol following bolus administration of intravenous contrast.  CONTRAST:  50 mL OMNIPAQUE IOHEXOL 300 MG/ML  SOLN  COMPARISON:  CT abdomen and pelvis 03/03/2012 and 01/10/2010.  FINDINGS: Lung bases are clear.  No pleural or pericardial effusion.  The gallbladder, liver, spleen, pancreas and right kidney are unremarkable. Calcified adrenal glands are consistent with prior infection or hemorrhage. Tiny exophytic lesion off the upper pole left kidney is likely a cyst and unchanged. The stomach, small and large bowel and appendix all appear normal. No fracture or worrisome bony lesion is seen. Small sclerotic lesion in the proximal right femur is unchanged and consistent with a bone island.  IMPRESSION: No acute finding.  Stable compared to prior exam.  Electronically Signed   By: Drusilla Kanner M.D.   On: 01/30/2014 15:26   US Abdomen Limited  01/30/2014   CLINICAL DATA:  Abdominal pain for 2 weeks.  EXAM: US ABDOMEN LIMITED - RIGHT UPPER QUADRANT  COMPARISON:  CT abdomen and pelvis 03/03/2012.  FINDINGS: Gallbladder:  No gallstones or wall thickening visualized. No sonographic Murphy sign noted.  Common bile duct:  Diameter: 0.3 cm.  Liver:  No focal lesion identified. Within normal limits in parenchymal echogenicity.  IMPRESSION: Negative for gallstones.  Negative examination.   Electronically Signed   By: Drusilla Kanner M.D.   On: 01/30/2014 14:56    Scheduled Meds: . amLODipine  10 mg Oral Daily  . nystatin cream  1 application Topical TID  . pantoprazole (PROTONIX) IV  40 mg Intravenous Q12H   Continuous Infusions: . sodium chloride 125 mL/hr at 01/31/14 0841  . sodium chloride 1,000 mL (01/30/14 1741)    Principal Problem:   Pancreatitis, acute Active Problems:   Essential hypertension, benign   Alcohol use  1. Acute pancreatitis, secondary to alcohol use. The patient denies abusing alcohol but admitted drinking a large amount of week and a half ago. CT of his abdomen and ultrasound of his abdomen were unremarkable. His triglyceride level is within normal limits. Enalapril is on hold as it can cause pancreatitis. His lipase has improved. His pain has decreased. We'll start a clear liquid diet. We'll start Protonix empirically. We'll continue IV fluid hydration and supportive treatment.  Leukocytosis. This is likely secondary to the pancreatitis. He is afebrile and denies cough or pain with urination. We'll continue to monitor.  Hypertension. Currently stable. We'll continue amlodipine. Continue to hold enalapril for now.  Time spent: 25 minutes    [facility]  [facility] Pager [phone]. If 7PM-7AM, please contact night-coverage at [email or web address], password [facility] 01/31/2014, 10:53 AM  LOS: 1 day

## 2014-02-01 DIAGNOSIS — E669 Obesity, unspecified: Secondary | ICD-10-CM

## 2014-02-01 LAB — COMPREHENSIVE METABOLIC PANEL
ALT: 19 U/L (ref 0–53)
ANION GAP: 10 (ref 5–15)
AST: 11 U/L (ref 0–37)
Albumin: 3.1 g/dL — ABNORMAL LOW (ref 3.5–5.2)
Alkaline Phosphatase: 126 U/L — ABNORMAL HIGH (ref 39–117)
BUN: 14 mg/dL (ref 6–23)
CO2: 28 mEq/L (ref 19–32)
CREATININE: 0.95 mg/dL (ref 0.50–1.35)
Calcium: 9.3 mg/dL (ref 8.4–10.5)
Chloride: 102 mEq/L (ref 96–112)
GFR calc Af Amer: >90 mL/min (ref >90)
GFR calc non Af Amer: >90 mL/min (ref >90)
Glucose, Bld: 90 mg/dL (ref 70–99)
POTASSIUM: 4.1 meq/L (ref 3.7–5.3)
Sodium: 140 mEq/L (ref 137–147)
TOTAL PROTEIN: 6.3 g/dL (ref 6.0–8.3)
Total Bilirubin: 0.3 mg/dL (ref 0.3–1.2)

## 2014-02-01 LAB — CBC
HCT: 39.5 % (ref 39.0–52.0)
HEMOGLOBIN: 13.7 g/dL (ref 13.0–17.0)
MCH: 31 pg (ref 26.0–34.0)
MCHC: 34.7 g/dL (ref 30.0–36.0)
MCV: 89.4 fL (ref 78.0–100.0)
Platelets: 362 10*3/uL (ref 150–400)
RBC: 4.42 MIL/uL (ref 4.22–5.81)
RDW: 13.4 % (ref 11.5–15.5)
WBC: 11 10*3/uL — ABNORMAL HIGH (ref 4.0–10.5)

## 2014-02-01 LAB — LIPASE, BLOOD: Lipase: 33 U/L (ref 11–59)

## 2014-02-01 MED ORDER — HYDROCODONE-ACETAMINOPHEN 7.5-325 MG PO TABS: 1.0000 | ORAL_TABLET | Freq: Four times a day (QID) | ORAL | Status: DC | PRN

## 2014-02-01 MED ORDER — AMLODIPINE BESYLATE 10 MG PO TABS: 10.0000 mg | ORAL_TABLET | Freq: Every day | ORAL | Status: DC

## 2014-02-01 MED ORDER — PANTOPRAZOLE SODIUM 40 MG PO TBEC: 40.0000 mg | DELAYED_RELEASE_TABLET | Freq: Every day | ORAL | Status: DC

## 2014-02-01 NOTE — Discharge Summary (Signed)
Physician Discharge Summary  Nicolas [name] ZOX:[id number] DOB: 1976/01/28 DOA: 01/30/2014  PCP: Lilyan Punt, MD  Admit date: 01/30/2014 Discharge date: 02/01/2014  Time spent: Greater than 30 minutes  Recommendations for Outpatient Follow-up:  1. Recommend reassessment of the patient's blood pressure off of enalapril and on new medication, amlodipine.    Discharge Diagnoses:  1. Acute pancreatitis, thought to be secondary to recent alcohol use. Also consider enalapril induced. 2. Hypertension. 3. Alcohol use without history of abuse. 4. Mild obesity. 5. Reactive leukocytosis. 6. Recent hospitalization for chest pain, cardiac catheterization was normal.  Discharge Condition: Improved.  Diet recommendation: Heart healthy.  Filed Weights   01/30/14 1226 01/30/14 1817  Weight: 116.574 kg (257 lb) 112.1 kg (247 lb 2.2 oz)    History of present illness:  The patient is a 38 year old man with a history of hypertension. His history is also significant for recent hospitalization at [facility] approximately 2 weeks ago for chest pain. The chest pain occurred after he had drunk some vodka at a party. Apparently CPR was performed in the field as the patient had agonal breathing at that time. A Code STEMI was called. He underwent a cardiac catheterization which was essentially normal with a normal ejection fraction. He returned to the emergency department on 01/30/14 with a chief complaint of abdominal pain. In the ED, he was afebrile and hemodynamically stable. His lab data were significant for a lipase of 237. His liver enzymes were within normal limits with exception of a mildly elevated alkaline phosphatase. His white blood cell count was elevated at 16.0. CT of his abdomen and pelvis revealed no acute findings. Ultrasound of his abdomen revealed no gallstones and otherwise normal exam. He was admitted for further evaluation and management.  Hospital Course:   1. Acute  pancreatitis. The patient was made virtually n.p.o. for bowel rest. IV fluids were ordered for hydration. His pain was treated with as needed IV analgesics and his nausea was treated with as needed antiemetics. IV Protonix was started empirically. Enalapril was withheld as it can cause pancreatitis. His lipase was monitored. It eventually normalized. His drug list right level was within normal limits at 98. Eventually, his symptoms subsided. His diet was advanced which he tolerated it well. The etiology of his pancreatitis was unclear, but was likely associated with alcohol use even though it was not necessarily recent. His symptomatology during the previous hospitalization may have been the beginnings of acute pancreatitis following alcohol use at that time. Another possibility is that enalapril could have contributed to his pancreatitis. For this reason, he was instructed to discontinue it for now. Norvasc was started for hypertension treatment. Further management will be deferred to his primary care physician. He was also encouraged not to drink alcohol. He was discharged on Protonix for a couple of months empirically. 2. Leukocytosis. The patient had no signs of infection. He remained afebrile. The leukocytosis was thought to be reactive. His white blood cell count decreased to 11.0 prior to discharge without antibiotic therapy. 3. Hypertension. As above, enalapril was discontinued and amlodipine was started. He was discharged with a prescription for amlodipine. Further management per Dr. Gerda Diss.  Procedures:  None  Consultations:  None  Discharge Exam: Filed Vitals:   02/01/14 0553  BP: 157/97  Pulse: 78  Temp: 98.1 F (36.7 C)  Resp: 20    General: Alert and in no acute distress 38 year old Caucasian man. Cardiovascular: S1, S2, with no murmurs rubs or gallops. Respiratory: Clear to consultation bilaterally.  Abdomen: Positive bowel sounds, mildly obese, nontender,  nondistended.  Discharge Instructions You were cared for by a hospitalist during your hospital stay. If you have any questions about your discharge medications or the care you received while you were in the hospital after you are discharged, you can call the unit and asked to speak with the hospitalist on call if the hospitalist that took care of you is not available. Once you are discharged, your primary care physician will handle any further medical issues. Please note that NO REFILLS for any discharge medications will be authorized once you are discharged, as it is imperative that you return to your primary care physician (or establish a relationship with a primary care physician if you do not have one) for your aftercare needs so that they can reassess your need for medications and monitor your lab values.  Discharge Instructions   Diet - low sodium heart healthy    Complete by:  As directed      Discharge instructions    Complete by:  As directed   Stop enalapril for now. You have been placed on another blood pressure medication, amlodipine. Avoid drinking alcohol. Follow a low-fat diet.     Increase activity slowly    Complete by:  As directed             Medication List    STOP taking these medications       enalapril 10 MG tablet  Commonly known as:  VASOTEC     ibuprofen 600 MG tablet  Commonly known as:  ADVIL,MOTRIN     traMADol 50 MG tablet  Commonly known as:  ULTRAM      TAKE these medications       amLODipine 10 MG tablet  Commonly known as:  NORVASC  Take 1 tablet (10 mg total) by mouth daily.     HYDROcodone-acetaminophen 7.5-325 MG per tablet  Commonly known as:  NORCO  Take 1 tablet by mouth every 6 (six) hours as needed.     nystatin cream  Commonly known as:  MYCOSTATIN  Apply 1 application topically 3 (three) times daily. Apply tid groin area prn     pantoprazole 40 MG tablet  Commonly known as:  PROTONIX  Take 1 tablet (40 mg total) by mouth  daily.     promethazine 25 MG tablet  Commonly known as:  PHENERGAN  Take 1 tablet (25 mg total) by mouth every 8 (eight) hours as needed for nausea or vomiting.       No Known Allergies     Follow-up Information   Follow up with Lilyan Punt, MD. (Followup as scheduled.)    Specialty:  Family Medicine   Contact information:   [address] [place] [address] [phone]        The results of significant diagnostics from this hospitalization (including imaging, microbiology, ancillary and laboratory) are listed below for reference.    Significant Diagnostic Studies: Dg Chest 2 View  01/19/2014   CLINICAL DATA:  Chest pain; recent cardiac arrest  EXAM: CHEST  2 VIEW  COMPARISON:  January 18, 2014  FINDINGS: Patchy atelectasis is noted in the left base region. Elsewhere lungs are clear. Heart is mildly enlarged with pulmonary vascularity within normal limits. No adenopathy. No bone lesions are appreciable. There is degenerative change in the thoracic spine. No pneumothorax.  IMPRESSION: Areas of left lower lobe atelectatic change. Lungs elsewhere clear. No apparent pneumothorax. Stable mild cardiac prominence.   Electronically  Signed   By: Bretta Bang M.D.   On: 01/19/2014 07:35   Ct Head Wo Contrast  01/18/2014   CLINICAL DATA:  Altered mental status. Status post cardiac catheterization.  EXAM: CT HEAD WITHOUT CONTRAST  TECHNIQUE: Contiguous axial images were obtained from the base of the skull through the vertex without intravenous contrast.  COMPARISON:  None.  FINDINGS: There is no evidence of acute infarction, mass lesion, or intra- or extra-axial hemorrhage on CT.  The posterior fossa, including the cerebellum, brainstem and fourth ventricle, is within normal limits. The third and lateral ventricles, and basal ganglia are unremarkable in appearance. The cerebral hemispheres are symmetric in appearance, with normal gray-white differentiation. No mass effect or  midline shift is seen.  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 The visualized portions of the orbits are within normal limits. The paranasal sinuses and mastoid air cells are well-aerated. No significant soft tissue abnormalities are seen.  IMPRESSION: 1. No acute intracranial pathology seen on CT. 2. 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   Electronically Signed   By: Roanna Raider M.D.   On: 01/18/2014 06:23   Ct Abdomen Pelvis W Contrast  01/30/2014   CLINICAL DATA:  Upper abdominal pain since undergoing CPR 2 weeks ago.  EXAM: CT ABDOMEN AND PELVIS WITH CONTRAST  TECHNIQUE: Multidetector CT imaging of the abdomen and pelvis was performed using the standard protocol following bolus administration of intravenous contrast.  CONTRAST:  50 mL OMNIPAQUE IOHEXOL 300 MG/ML  SOLN  COMPARISON:  CT abdomen and pelvis 03/03/2012 and 01/10/2010.  FINDINGS: Lung bases are clear.  No pleural or pericardial effusion.  The gallbladder, liver, spleen, pancreas and right kidney are unremarkable. Calcified adrenal glands are consistent with prior infection or hemorrhage. Tiny exophytic lesion off the upper pole left kidney is likely a cyst and unchanged. The stomach, small and large bowel and appendix all appear normal. No fracture or worrisome bony lesion is seen. Small sclerotic lesion in the proximal right femur is unchanged and consistent with a bone island.  IMPRESSION: No acute finding.  Stable compared to prior exam.   Electronically Signed   By: Drusilla Kanner M.D.   On: 01/30/2014 15:26   US Abdomen Limited  01/30/2014   CLINICAL DATA:  Abdominal pain for 2 weeks.  EXAM: US ABDOMEN LIMITED - RIGHT UPPER QUADRANT  COMPARISON:  CT abdomen and pelvis 03/03/2012.  FINDINGS: Gallbladder:  No gallstones or wall thickening visualized. No sonographic Murphy sign noted.  Common bile duct:  Diameter: 0.3 cm.  Liver:  No focal lesion identified. Within normal  limits in parenchymal echogenicity.  IMPRESSION: Negative for gallstones.  Negative examination.   Electronically Signed   By: Drusilla Kanner M.D.   On: 01/30/2014 14:56   Dg Chest Portable 1 View  01/18/2014   CLINICAL DATA:  Myocardial infarction, chest pain  EXAM: PORTABLE CHEST - 1 VIEW  COMPARISON:  10/30/2012  FINDINGS: Heart size upper normal to mildly enlarged. Vascular pattern normal. Lungs clear.  IMPRESSION: No acute finding   Electronically Signed   By: Esperanza Heir M.D.   On: 01/18/2014 01:46    Microbiology: No results found for this or any previous visit (from the past 240 hour(s)).   Labs: Basic Metabolic Panel:  Recent Labs Lab 01/30/14 1250 01/31/14 0532 02/01/14 0604  NA 140 139 140  K 4.3 4.3 4.1  CL 100 103 102  CO2 27 25 28  GLUCOSE 111* 90 90  BUN CREATININE 0.83 0.85 0.95  CALCIUM 9.9 9.5 9.3   Liver Function Tests:  Recent Labs Lab 01/30/14 1250 02/01/14 0604  AST 18 11  ALT 32 19  ALKPHOS 131* 126*  BILITOT 0.3 0.3  PROT 7.5 6.3  ALBUMIN 3.7 3.1*    Recent Labs Lab 01/30/14 1426 01/31/14 0532 02/01/14 0604  LIPASE 237* 72* 33   No results found for this basename: AMMONIA,  in the last 168 hours CBC:  Recent Labs Lab 01/30/14 1250 01/31/14 0532 02/01/14 0604  WBC 14.6* 16.0* 11.0*  NEUTROABS 10.5*  --   --   HGB 16.3 14.5 13.7  HCT 44.9 41.6 39.5  MCV 88.4 89.7 89.4  PLT 430* 408* 362   Cardiac Enzymes: No results found for this basename: CKTOTAL, CKMB, CKMBINDEX, TROPONINI,  in the last 168 hours BNP: BNP (last 3 results) No results found for this basename: PROBNP,  in the last 8760 hours CBG: No results found for this basename: GLUCAP,  in the last 168 hours     Signed:  Paighton Godette  [facility] 02/01/2014, 11:25 AM

## 2014-02-01 NOTE — Progress Notes (Signed)
Discharged to home with spouse.

## 2014-02-01 NOTE — Progress Notes (Signed)
Nutrition Brief Note  Patient identified on the Malnutrition Screening Tool (MST) Report  Pt presents with acute pancreatitis. His diet has been advanced and he is tolerating.  Body mass index is 30.89 kg/(m^2). Patient meets criteria for obesity class I based on current BMI.   Current diet order is Heart Healthy , patient is consuming approximately 75% of meals at this time. Labs and medications reviewed.   No nutrition interventions warranted at this time. If nutrition issues arise, please consult RD.   Royann Shivers MS,RD,CSG,LDN Office: [phone] Pager: [phone]

## 2014-02-06 ENCOUNTER — Inpatient Hospital Stay (HOSPITAL_COMMUNITY)
Admission: EM | Admit: 2014-02-06 | Discharge: 2014-02-08 | DRG: 440 | Disposition: A | Discharge status: Home / Self Care | Payer: MEDICAID | Attending: Internal Medicine | Admitting: Internal Medicine

## 2014-02-06 ENCOUNTER — Encounter (HOSPITAL_COMMUNITY): Payer: Self-pay | Admitting: Emergency Medicine

## 2014-02-06 DIAGNOSIS — F17200 Nicotine dependence, unspecified, uncomplicated: Secondary | ICD-10-CM | POA: Diagnosis present

## 2014-02-06 DIAGNOSIS — K859 Acute pancreatitis without necrosis or infection, unspecified: Principal | ICD-10-CM | POA: Diagnosis present

## 2014-02-06 DIAGNOSIS — I252 Old myocardial infarction: Secondary | ICD-10-CM

## 2014-02-06 DIAGNOSIS — E669 Obesity, unspecified: Secondary | ICD-10-CM | POA: Diagnosis present

## 2014-02-06 DIAGNOSIS — K861 Other chronic pancreatitis: Secondary | ICD-10-CM | POA: Diagnosis present

## 2014-02-06 DIAGNOSIS — I10 Essential (primary) hypertension: Secondary | ICD-10-CM | POA: Diagnosis present

## 2014-02-06 DIAGNOSIS — R112 Nausea with vomiting, unspecified: Secondary | ICD-10-CM

## 2014-02-06 DIAGNOSIS — Z8674 Personal history of sudden cardiac arrest: Secondary | ICD-10-CM

## 2014-02-06 DIAGNOSIS — R1084 Generalized abdominal pain: Secondary | ICD-10-CM

## 2014-02-06 DIAGNOSIS — Z6830 Body mass index (BMI) 30.0-30.9, adult: Secondary | ICD-10-CM

## 2014-02-06 HISTORY — DX: Acute pancreatitis without necrosis or infection, unspecified: K85.90

## 2014-02-06 LAB — URINALYSIS, ROUTINE W REFLEX MICROSCOPIC
Bilirubin Urine: NEGATIVE
Glucose, UA: NEGATIVE mg/dL
Hgb urine dipstick: NEGATIVE
KETONES UR: NEGATIVE mg/dL
Leukocytes, UA: NEGATIVE
NITRITE: NEGATIVE
Protein, ur: NEGATIVE mg/dL
SPECIFIC GRAVITY, URINE: 1.02 (ref 1.005–1.030)
Urobilinogen, UA: 1 mg/dL (ref 0.0–1.0)
pH: 6 (ref 5.0–8.0)

## 2014-02-06 LAB — COMPREHENSIVE METABOLIC PANEL
ALT: 26 U/L (ref 0–53)
ANION GAP: 11 (ref 5–15)
AST: 16 U/L (ref 0–37)
Albumin: 3.9 g/dL (ref 3.5–5.2)
Alkaline Phosphatase: 156 U/L — ABNORMAL HIGH (ref 39–117)
BUN: 14 mg/dL (ref 6–23)
CO2: 28 meq/L (ref 19–32)
Calcium: 10.5 mg/dL (ref 8.4–10.5)
Chloride: 100 mEq/L (ref 96–112)
Creatinine, Ser: 0.91 mg/dL (ref 0.50–1.35)
GFR calc non Af Amer: >90 mL/min (ref >90)
GLUCOSE: 105 mg/dL — AB (ref 70–99)
POTASSIUM: 4.7 meq/L (ref 3.7–5.3)
Sodium: 139 mEq/L (ref 137–147)
TOTAL PROTEIN: 7.7 g/dL (ref 6.0–8.3)
Total Bilirubin: 0.5 mg/dL (ref 0.3–1.2)

## 2014-02-06 LAB — CBC WITH DIFFERENTIAL/PLATELET
BASOS ABS: 0 10*3/uL (ref 0.0–0.1)
BASOS PCT: 0 % (ref 0–1)
EOS PCT: 3 % (ref 0–5)
Eosinophils Absolute: 0.3 10*3/uL (ref 0.0–0.7)
HEMATOCRIT: 43.9 % (ref 39.0–52.0)
Hemoglobin: 16 g/dL (ref 13.0–17.0)
LYMPHS PCT: 23 % (ref 12–46)
Lymphs Abs: 2.6 10*3/uL (ref 0.7–4.0)
MCH: 32 pg (ref 26.0–34.0)
MCHC: 36.4 g/dL — AB (ref 30.0–36.0)
MCV: 87.8 fL (ref 78.0–100.0)
MONO ABS: 0.8 10*3/uL (ref 0.1–1.0)
MONOS PCT: 7 % (ref 3–12)
Neutro Abs: 7.5 10*3/uL (ref 1.7–7.7)
Neutrophils Relative %: 67 % (ref 43–77)
Platelets: 402 10*3/uL — ABNORMAL HIGH (ref 150–400)
RBC: 5 MIL/uL (ref 4.22–5.81)
RDW: 13.3 % (ref 11.5–15.5)
WBC: 11.2 10*3/uL — ABNORMAL HIGH (ref 4.0–10.5)

## 2014-02-06 LAB — LIPASE, BLOOD: Lipase: 170 U/L — ABNORMAL HIGH (ref 11–59)

## 2014-02-06 LAB — AMYLASE: Amylase: 102 U/L (ref 0–105)

## 2014-02-06 MED ORDER — HYDROMORPHONE HCL PF 1 MG/ML IJ SOLN
1.0000 mg | INTRAMUSCULAR | Status: DC | PRN
  Administered 2014-02-06 – 2014-02-07 (×5): 1 mg via INTRAVENOUS
  Filled 2014-02-06 (×5): qty 1

## 2014-02-06 MED ORDER — NYSTATIN 100000 UNIT/GM EX CREA
1.0000 "application " | TOPICAL_CREAM | Freq: Three times a day (TID) | CUTANEOUS | Status: DC
  Administered 2014-02-07: 1 via TOPICAL
  Filled 2014-02-06: qty 15

## 2014-02-06 MED ORDER — ONDANSETRON HCL 4 MG/2ML IJ SOLN
4.0000 mg | Freq: Four times a day (QID) | INTRAMUSCULAR | Status: DC | PRN
  Administered 2014-02-06: 4 mg via INTRAVENOUS
  Filled 2014-02-06: qty 2

## 2014-02-06 MED ORDER — FAMOTIDINE IN NACL 20-0.9 MG/50ML-% IV SOLN
20.0000 mg | Freq: Once | INTRAVENOUS | Status: AC
  Administered 2014-02-06: 20 mg via INTRAVENOUS
  Filled 2014-02-06: qty 50

## 2014-02-06 MED ORDER — ALUM & MAG HYDROXIDE-SIMETH 200-200-20 MG/5ML PO SUSP: 30.0000 mL | Freq: Four times a day (QID) | ORAL | Status: DC | PRN

## 2014-02-06 MED ORDER — ONDANSETRON HCL 4 MG/2ML IJ SOLN: 4.0000 mg | Freq: Three times a day (TID) | INTRAMUSCULAR | Status: DC | PRN

## 2014-02-06 MED ORDER — ONDANSETRON HCL 4 MG PO TABS
4.0000 mg | ORAL_TABLET | Freq: Four times a day (QID) | ORAL | Status: DC | PRN
Start: 2014-02-06 — End: 2014-02-08

## 2014-02-06 MED ORDER — SODIUM CHLORIDE 0.9 % IV SOLN
Freq: Once | INTRAVENOUS | Status: AC
  Administered 2014-02-06: 18:00:00 via INTRAVENOUS

## 2014-02-06 MED ORDER — SODIUM CHLORIDE 0.9 % IV BOLUS (SEPSIS)
1000.0000 mL | Freq: Once | INTRAVENOUS | Status: AC
  Administered 2014-02-06: 1000 mL via INTRAVENOUS

## 2014-02-06 MED ORDER — HYDROMORPHONE HCL PF 1 MG/ML IJ SOLN
1.0000 mg | INTRAMUSCULAR | Status: DC | PRN
  Administered 2014-02-06: 1 mg via INTRAVENOUS
  Filled 2014-02-06: qty 1

## 2014-02-06 MED ORDER — ACETAMINOPHEN 325 MG PO TABS: 650.0000 mg | ORAL_TABLET | Freq: Four times a day (QID) | ORAL | Status: DC | PRN

## 2014-02-06 MED ORDER — PANTOPRAZOLE SODIUM 40 MG PO TBEC
40.0000 mg | DELAYED_RELEASE_TABLET | Freq: Every day | ORAL | Status: DC
  Administered 2014-02-07 – 2014-02-08 (×2): 40 mg via ORAL
  Filled 2014-02-06 (×2): qty 1

## 2014-02-06 MED ORDER — DOCUSATE SODIUM 100 MG PO CAPS
100.0000 mg | ORAL_CAPSULE | Freq: Every day | ORAL | Status: DC | PRN
  Administered 2014-02-07 – 2014-02-08 (×2): 100 mg via ORAL
  Filled 2014-02-06 (×2): qty 1

## 2014-02-06 MED ORDER — METOCLOPRAMIDE HCL 5 MG/ML IJ SOLN
10.0000 mg | Freq: Once | INTRAMUSCULAR | Status: AC
  Administered 2014-02-06: 10 mg via INTRAVENOUS
  Filled 2014-02-06: qty 2

## 2014-02-06 MED ORDER — HYDROMORPHONE HCL PF 1 MG/ML IJ SOLN
0.5000 mg | Freq: Once | INTRAMUSCULAR | Status: AC
  Administered 2014-02-06: 0.5 mg via INTRAVENOUS
  Filled 2014-02-06: qty 1

## 2014-02-06 MED ORDER — AMLODIPINE BESYLATE 5 MG PO TABS
10.0000 mg | ORAL_TABLET | Freq: Every day | ORAL | Status: DC
  Administered 2014-02-07 – 2014-02-08 (×2): 10 mg via ORAL
  Filled 2014-02-06 (×2): qty 2

## 2014-02-06 MED ORDER — HYDROMORPHONE HCL PF 1 MG/ML IJ SOLN
1.0000 mg | Freq: Once | INTRAMUSCULAR | Status: AC
  Administered 2014-02-06: 1 mg via INTRAVENOUS
  Filled 2014-02-06: qty 1

## 2014-02-06 MED ORDER — PROMETHAZINE HCL 12.5 MG PO TABS: 25.0000 mg | ORAL_TABLET | Freq: Three times a day (TID) | ORAL | Status: DC | PRN

## 2014-02-06 MED ORDER — SODIUM CHLORIDE 0.9 % IV SOLN
INTRAVENOUS | Status: DC
  Administered 2014-02-06 – 2014-02-08 (×6): via INTRAVENOUS

## 2014-02-06 MED ORDER — ACETAMINOPHEN 650 MG RE SUPP: 650.0000 mg | Freq: Four times a day (QID) | RECTAL | Status: DC | PRN

## 2014-02-06 MED ORDER — ONDANSETRON HCL 4 MG/2ML IJ SOLN
4.0000 mg | Freq: Once | INTRAMUSCULAR | Status: AC
  Administered 2014-02-06: 4 mg via INTRAVENOUS
  Filled 2014-02-06: qty 2

## 2014-02-06 NOTE — ED Notes (Signed)
Pt requesting something else for pain and something to drink.  Notified Dr. Jodi Mourning and ok to have ice chips.  See MAR.

## 2014-02-06 NOTE — H&P (Signed)
History and Physical  Nicolas [name] AXK:[id number] DOB: [birth year] DOA: 02/06/2014  Referring physician: Dr Jodi Mourning, ED physician PCP: Lilyan Punt, MD   Chief Complaint: Abdominal pain  HPI: Nicolas [name] is a 38 y.o. male  With a history of hypertension, recent cardiac arrest with normal cardiac catheterization, and recent hospitalization for acute pancreatitis 1 week ago. The patient was discharged from the hospital 5 days ago with resolution of his abdominal pain. Approximately a day and a half ago, pain started to return in his left upper quadrant and epigastric area with radiation into his back. Movement and eating increased his pain. He has been unable to sleep over the past day and a half and reports a 22 pound weight loss over the past 2-3 weeks. Patient absolutely denies alcohol and other drug use.   Review of Systems:   Pt complains of nausea, anorexia, abdominal pain.  Pt denies any fevers, chills, chest pain, shortness of breath, palpitations, hematemesis.  Review of systems are otherwise negative  Past Medical History  Diagnosis Date  . Hypertension   . Kidney stone   . Pancreatitis    Past Surgical History  Procedure Laterality Date  . Cardiac catheterization     Social History:  reports that he has been smoking Cigarettes.  He has a 15 pack-year smoking history. He has never used smokeless tobacco. He reports that he does not drink alcohol or use illicit drugs. Patient lives at home & is able to participate in activities of daily living without assistance  No Known Allergies  Family History  Problem Relation Age of Onset  . Cancer Mother     Breast     Prior to Admission medications   Medication Sig Start Date End Date Taking? Authorizing Provider  HYDROcodone-acetaminophen (NORCO) 7.5-325 MG per tablet Take 1 tablet by mouth every 6 (six) hours as needed (pain).   Yes Historical Provider, MD  nystatin cream (MYCOSTATIN) Apply 1 application topically 3  (three) times daily. Apply tid groin area prn 01/23/14  Yes Scott A Luking, MD  amLODipine (NORVASC) 10 MG tablet Take 1 tablet (10 mg total) by mouth daily. 02/01/14   Elliot Cousin, MD  pantoprazole (PROTONIX) 40 MG tablet Take 1 tablet (40 mg total) by mouth daily. 02/01/14   Elliot Cousin, MD  promethazine (PHENERGAN) 25 MG tablet Take 1 tablet (25 mg total) by mouth every 8 (eight) hours as needed for nausea or vomiting. 01/23/14   [name], MD    Physical Exam: BP 136/98  Pulse 85  Temp(Src) 97.9 F (36.6 C) (Oral)  Resp 16  Ht 6\' 3"  (1.905 m)  Wt 112.038 kg (247 lb)  BMI 30.87 kg/m2  SpO2 99%  General: Middle-aged Caucasian male. Awake and alert and oriented x3. No acute cardiopulmonary distress.  Eyes: Pupils equal, round, reactive to light. Extraocular muscles are intact. Sclerae anicteric and noninjected.  ENT: External auditory canals are patent and tympanic membranes reflect a good cone of light. Moist mucosal membranes. No mucosal lesions. Teeth in good repair  Neck: Neck supple without lymphadenopathy. No carotid bruits. No masses palpated.  Cardiovascular: Regular rate with normal S1-S2 sounds. No murmurs, rubs, gallops auscultated. No JVD.  Respiratory: Good respiratory effort with no wheezes, rales, rhonchi. Lungs clear to auscultation bilaterally.  Abdomen: Soft, tender in the left upper quadrant and epigastric area with mild guarding, nondistended. Hyperactive bowel sounds. No masses or hepatosplenomegaly  Skin: Dry, warm to touch. 2+ dorsalis pedis and radial pulses. Musculoskeletal: No calf  or leg pain. All major joints not erythematous and nontender.  Psychiatric: Intact judgment and insight.  Neurologic: No focal neurological deficits. Cranial nerves II through XII are grossly intact.           Labs on Admission:  Basic Metabolic Panel:  Recent Labs Lab 01/31/14 0532 02/01/14 0604 02/06/14 1446  NA 139 140 139  K 4.3 4.1 4.7  CL 103 102 100  CO2 GLUCOSE 90 90 105*  BUN CREATININE 0.85 0.95 0.91  CALCIUM 9.5 9.3 10.5   Liver Function Tests:  Recent Labs Lab 02/01/14 0604 02/06/14 1446  AST 11 16  ALT 19 26  ALKPHOS 126* 156*  BILITOT 0.3 0.5  PROT 6.3 7.7  ALBUMIN 3.1* 3.9    Recent Labs Lab 01/31/14 0532 02/01/14 0604 02/06/14 1446  LIPASE 72* 33 170*  AMYLASE  --   --  102   No results found for this basename: AMMONIA,  in the last 168 hours CBC:  Recent Labs Lab 01/31/14 0532 02/01/14 0604 02/06/14 1446  WBC 16.0* 11.0* 11.2*  NEUTROABS  --   --  7.5  HGB 14.5 13.7 16.0  HCT 41.6 39.5 43.9  MCV 89.7 89.4 87.8  PLT 408* 362 402*   Cardiac Enzymes: No results found for this basename: CKTOTAL, CKMB, CKMBINDEX, TROPONINI,  in the last 168 hours  BNP (last 3 results) No results found for this basename: PROBNP,  in the last 8760 hours CBG: No results found for this basename: GLUCAP,  in the last 168 hours  Radiological Exams on Admission: No results found.   Assessment/Plan Present on Admission:  . Acute pancreatitis  #1 acute pancreatitis - recurrent Admit for observation. IV fluids at 225 mL per hour N.p.o. except for ice chips and sips with meds Repeat lipase in the morning No clear etiology as patient denies alcohol use, has been off enalapril for one week, and triglycerides are normal. AST, abdominal ultrasound from last visit, and abdominal CT are normal, which would suggest that etiology is not related to cholelithiasis or gallbladder sludge. May need MRCP as an outpatient  #2 hypertension Continue amlodipine  DVT prophylaxis: Ambulation  Consultants: None  Code Status: Full  Family Communication: None   Disposition Plan: Return home following improvement of lipase  Time spent: 50 minutes  Candelaria Celeste, DO [facility] Pager [phone]  **Disclaimer: This note may have been dictated with voice recognition software. Similar sounding words can  inadvertently be transcribed and this note may contain transcription errors which may not have been corrected upon publication of note.**

## 2014-02-06 NOTE — ED Notes (Addendum)
abd pain , recent adm for pancreatitis.  Nausea, no vomiting.denies etoh.

## 2014-02-06 NOTE — ED Notes (Signed)
Pt reports was admitted for pancreatitis Friday and discharged.  Reports Wednesday pain and nausea returned.  LBM was this morning but was small per pt.  Reports nausea, no vomiting.

## 2014-02-06 NOTE — ED Provider Notes (Signed)
CSN: [id number]     Arrival date & time 02/06/14  1432 History   This chart was scribed for Enid Skeens, MD by Tonye Royalty, ED Scribe. This patient was seen in room APA18/APA18 and the patient's care was started at 3:00 PM.   Chief Complaint  Patient presents with  . Abdominal Pain   The history is provided by the patient. No language interpreter was used.   HPI Comments: Nicolas [name] is a 38 y.o. male who presents to the Emergency Department complaining of abdominal pain to his epigastrium and LUQ with onset approximately 3 weeks ago. He states he was seen at this Emergency Department 1 week ago and was hospitalized for pancreatitis and released from the hospital 5 days ago; he states he was improved at that time but states his condition has worsened since then. He denies recent alcohol use. He states he has been unable tolerate by mouth since 2 days ago. He reports associated nausea and lack of bowel movements but still passes gas. He denies vomiting, diarrhea, dysuria, fever, or chills. He also reports chest tenderness from CPR performed some weeks ago, but states heart catheterization did not reveal evidence of cardiac disease.   Past Medical History  Diagnosis Date  . Hypertension   . Kidney stone   . Pancreatitis    Past Surgical History  Procedure Laterality Date  . Cardiac catheterization     Family History  Problem Relation Age of Onset  . Cancer Mother     Breast   History  Substance Use Topics  . Smoking status: Current Every Day Smoker -- 1.50 packs/day for 10 years    Types: Cigarettes  . Smokeless tobacco: Never Used  . Alcohol Use: No    Review of Systems  Constitutional: Negative for fever and chills.  Gastrointestinal: Positive for nausea, abdominal pain and diarrhea. Negative for vomiting.  Genitourinary: Negative for dysuria and difficulty urinating.  All other systems reviewed and are negative.     Allergies  Review of patient's allergies  indicates no known allergies.  Home Medications   Prior to Admission medications   Medication Sig Start Date End Date Taking? Authorizing Provider  HYDROcodone-acetaminophen (NORCO) 7.5-325 MG per tablet Take 1 tablet by mouth every 6 (six) hours as needed (pain).   Yes Historical Provider, MD  nystatin cream (MYCOSTATIN) Apply 1 application topically 3 (three) times daily. Apply tid groin area prn 01/23/14  Yes Scott A Luking, MD  amLODipine (NORVASC) 10 MG tablet Take 1 tablet (10 mg total) by mouth daily. 02/01/14   Elliot Cousin, MD  pantoprazole (PROTONIX) 40 MG tablet Take 1 tablet (40 mg total) by mouth daily. 02/01/14   Elliot Cousin, MD  promethazine (PHENERGAN) 25 MG tablet Take 1 tablet (25 mg total) by mouth every 8 (eight) hours as needed for nausea or vomiting. 01/23/14   [name], MD   BP 150/105  Pulse 110  Temp(Src) 99 F (37.2 C) (Oral)  Resp 20  Ht 6\' 3"  (1.905 m)  Wt 247 lb (112.038 kg)  BMI 30.87 kg/m2  SpO2 98% Physical Exam  Nursing note and vitals reviewed. Constitutional: He is oriented to person, place, and time. He appears well-developed and well-nourished. No distress.  HENT:  Head: Normocephalic and atraumatic.  Dry mucous membranes  Eyes: Conjunctivae are normal.  Neck: Normal range of motion and phonation normal. Neck supple.  Cardiovascular: Regular rhythm and normal heart sounds.  Tachycardia present.   Pulmonary/Chest: Effort normal and  breath sounds normal. He exhibits no bony tenderness.  Abdominal: Soft. Bowel sounds are normal. There is tenderness (epigatsric, LUQ). There is no guarding.  Musculoskeletal: He exhibits no edema.  Neurological: He is alert and oriented to person, place, and time. No sensory deficit.  Skin: Skin is warm, dry and intact.  Psychiatric: He has a normal mood and affect. His behavior is normal. Judgment and thought content normal.    ED Course  Procedures (including critical care time) Labs Review Labs Reviewed   CBC WITH DIFFERENTIAL - Abnormal; Notable for the following:    WBC 11.2 (*)    MCHC 36.4 (*)    Platelets 402 (*)    All other components within normal limits  LIPASE, BLOOD - Abnormal; Notable for the following:    Lipase 170 (*)    All other components within normal limits  COMPREHENSIVE METABOLIC PANEL - Abnormal; Notable for the following:    Glucose, Bld 105 (*)    Alkaline Phosphatase 156 (*)    All other components within normal limits  AMYLASE  URINALYSIS, ROUTINE W REFLEX MICROSCOPIC  BASIC METABOLIC PANEL  CBC  LIPASE, BLOOD    Imaging Review No results found.   EKG Interpretation None     DIAGNOSTIC STUDIES: Oxygen Saturation is 98% on room air, normal by my interpretation.    COORDINATION OF CARE:    MDM   Final diagnoses:  Acute pancreatitis, unspecified pancreatitis type  Abdominal pain, generalized  Non-intractable vomiting with nausea, vomiting of unspecified type   I personally performed the services described in this documentation, which was scribed in my presence. The recorded information has been reviewed and is accurate.  Patient presents with recurrent abdominal pain similar to previous pancreatitis. Patient unable to tolerate by mouth past few days. Plan for abdominal labs, antiemetics, pain meds, fluids and reassessment. Patient had a recent CT abdomen pelvis, no gallstone seen.  On recheck patient having persistent pain and nausea. Repeat nausea meds ordered. Repeat pain medicines ordered. Plan for observation for pancreatitis, IV fluids and pain meds. The patients results and plan were reviewed and discussed.   Any x-rays performed were personally reviewed by myself.   Differential diagnosis were considered with the presenting HPI.  Medications  ondansetron (ZOFRAN) injection 4 mg (not administered)  amLODipine (NORVASC) tablet 10 mg (not administered)  pantoprazole (PROTONIX) EC tablet 40 mg (not administered)  nystatin cream  (MYCOSTATIN) 1 application (not administered)  promethazine (PHENERGAN) tablet 25 mg (not administered)  acetaminophen (TYLENOL) tablet 650 mg (not administered)    Or  acetaminophen (TYLENOL) suppository 650 mg (not administered)  docusate sodium (COLACE) capsule 100 mg (not administered)  alum & mag hydroxide-simeth (MAALOX/MYLANTA) 200-200-20 MG/5ML suspension 30 mL (not administered)  0.9 %  sodium chloride infusion ( Intravenous New Bag/Given 02/06/14 1856)  ondansetron (ZOFRAN) tablet 4 mg ( Oral See Alternative 02/06/14 2144)    Or  ondansetron (ZOFRAN) injection 4 mg (4 mg Intravenous Given 02/06/14 2144)  HYDROmorphone (DILAUDID) injection 1 mg (1 mg Intravenous Given 02/06/14 2143)  sodium chloride 0.9 % bolus 1,000 mL (0 mLs Intravenous Stopped 02/06/14 1633)  ondansetron (ZOFRAN) injection 4 mg (4 mg Intravenous Given 02/06/14 1522)  HYDROmorphone (DILAUDID) injection 1 mg (1 mg Intravenous Given 02/06/14 1522)  famotidine (PEPCID) IVPB 20 mg (0 mg Intravenous Stopped 02/06/14 1633)  HYDROmorphone (DILAUDID) injection 1 mg (1 mg Intravenous Given 02/06/14 1644)  metoCLOPramide (REGLAN) injection 10 mg (10 mg Intravenous Given 02/06/14 1739)  0.9 %  sodium chloride infusion ( Intravenous New Bag/Given 02/06/14 1740)  HYDROmorphone (DILAUDID) injection 0.5 mg (0.5 mg Intravenous Given 02/06/14 1748)     Filed Vitals:   02/06/14 1645 02/06/14 1815 02/06/14 1826 02/06/14 2133  BP: 150/96 136/98 132/85 129/83  Pulse: 86 85 88 80  Temp:  97.9 F (36.6 C) 97.8 F (36.6 C) 98.4 F (36.9 C)  TempSrc:  Oral Oral Oral  Resp: Height:    (1.905 m)   Weight:   238 lb (107.956 kg)   SpO2: 96% 99% 98% 100%    Admission/ observation were discussed with the admitting physician, patient and/or family and they are comfortable with the plan.      Enid Skeens, MD 02/06/14 2223

## 2014-02-07 DIAGNOSIS — K859 Acute pancreatitis without necrosis or infection, unspecified: Principal | ICD-10-CM

## 2014-02-07 DIAGNOSIS — R1084 Generalized abdominal pain: Secondary | ICD-10-CM

## 2014-02-07 LAB — BASIC METABOLIC PANEL
Anion gap: 10 (ref 5–15)
BUN: 14 mg/dL (ref 6–23)
CO2: 27 meq/L (ref 19–32)
CREATININE: 0.87 mg/dL (ref 0.50–1.35)
Calcium: 9.3 mg/dL (ref 8.4–10.5)
Chloride: 102 mEq/L (ref 96–112)
GFR calc Af Amer: >90 mL/min (ref >90)
GFR calc non Af Amer: >90 mL/min (ref >90)
GLUCOSE: 82 mg/dL (ref 70–99)
Potassium: 4.3 mEq/L (ref 3.7–5.3)
SODIUM: 139 meq/L (ref 137–147)

## 2014-02-07 LAB — CBC
HEMATOCRIT: 38.9 % — AB (ref 39.0–52.0)
Hemoglobin: 13.6 g/dL (ref 13.0–17.0)
MCH: 31.3 pg (ref 26.0–34.0)
MCHC: 35 g/dL (ref 30.0–36.0)
MCV: 89.4 fL (ref 78.0–100.0)
Platelets: 346 10*3/uL (ref 150–400)
RBC: 4.35 MIL/uL (ref 4.22–5.81)
RDW: 13.5 % (ref 11.5–15.5)
WBC: 11.8 10*3/uL — AB (ref 4.0–10.5)

## 2014-02-07 LAB — LIPASE, BLOOD: Lipase: 60 U/L — ABNORMAL HIGH (ref 11–59)

## 2014-02-07 MED ORDER — BOOST / RESOURCE BREEZE PO LIQD: 1.0000 | Freq: Three times a day (TID) | ORAL | Status: DC

## 2014-02-07 MED ORDER — MORPHINE SULFATE 2 MG/ML IJ SOLN
2.0000 mg | INTRAMUSCULAR | Status: DC | PRN
  Administered 2014-02-07 – 2014-02-08 (×5): 2 mg via INTRAVENOUS
  Filled 2014-02-07 (×5): qty 1

## 2014-02-07 MED ORDER — HYDROCODONE-ACETAMINOPHEN 5-325 MG PO TABS
2.0000 | ORAL_TABLET | ORAL | Status: DC | PRN
  Administered 2014-02-07: 2 via ORAL
  Filled 2014-02-07 (×2): qty 2

## 2014-02-07 NOTE — Progress Notes (Signed)
[facility] PROGRESS NOTE  Nicolas [name] ZOX:[id number] DOB: December 07, 1975 DOA: 02/06/2014 PCP: Lilyan Punt, MD  Assessment/Plan: Acute recurrent pancreatitis No clear etiology. Recent hospitalization for the same. Patient denies any alcohol use. Was taken off enalapril with suspicion of causing his symptoms during last hospitalization. Triglycerides are normal. LFTs except for elevated alkaline phosphatase. CT scan and ultrasound abdomen from recent hospitalization have been unremarkable. -Given recurrent pancreatitis and elevated alkaline phosphatase would recommend MRCP as outpatient -Patient does not have significant symptoms and requests for advancing diet. I will put him on regular diet and monitor. Continue IV hydration antibiotics and prn pain control  Hypertension Continue amlodipine  Recent hospitalization for cardiac arrest Pt admitted with STEMI and had a normal cardiac cath   DVT prophylaxis: Early ambulation    Family Communication: None at bedside  Disposition Plan: Home tomorrow if continues to improve   Consultants:  None  Procedures:  None  Antibiotics:  None  HPI/Subjective: This reports some abdominal soreness. Wants to eat regular food. Denies any nausea vomiting  Objective: Filed Vitals:   02/07/14 0647  BP: 149/85  Pulse: 82  Temp: 97.9 F (36.6 C)  Resp: 20    Intake/Output Summary (Last 24 hours) at 02/07/14 1214 Last data filed at 02/07/14 0800  Gross per 24 hour  Intake      0 ml  Output      0 ml  Net      0 ml   Filed Weights   02/06/14 1435 02/06/14 1826 02/07/14 0647  Weight: 112.038 kg (247 lb) 107.956 kg (238 lb) 107.95 kg (237 lb 15.8 oz)    Exam:   General: Middle aged obese male in no acute distress  HEENT: No pallor, moist oral mucosa  Chest: Clear to auscultation bilaterally  CVS: Normal S1 and S2  Abdomen: Soft, nondistended, nontender, bowel sounds present  Extremities: Warm, no edema  CNS: Alert  and oriented    Data Reviewed: Basic Metabolic Panel:  Recent Labs Lab 02/01/14 0604 02/06/14 1446 02/07/14 0612  NA 140 139 139  K 4.1 4.7 4.3  CL 102 100 102  CO2 GLUCOSE 90 105* 82  BUN CREATININE 0.95 0.91 0.87  CALCIUM 9.3 10.5 9.3   Liver Function Tests:  Recent Labs Lab 02/01/14 0604 02/06/14 1446  AST 11 16  ALT 19 26  ALKPHOS 126* 156*  BILITOT 0.3 0.5  PROT 6.3 7.7  ALBUMIN 3.1* 3.9    Recent Labs Lab 02/01/14 0604 02/06/14 1446 02/07/14 0612  LIPASE 33 170* 60*  AMYLASE  --  102  --    No results found for this basename: AMMONIA,  in the last 168 hours CBC:  Recent Labs Lab 02/01/14 0604 02/06/14 1446 02/07/14 0612  WBC 11.0* 11.2* 11.8*  NEUTROABS  --  7.5  --   HGB 13.7 16.0 13.6  HCT 39.5 43.9 38.9*  MCV 89.4 87.8 89.4  PLT 362 402* 346   Cardiac Enzymes: No results found for this basename: CKTOTAL, CKMB, CKMBINDEX, TROPONINI,  in the last 168 hours BNP (last 3 results) No results found for this basename: PROBNP,  in the last 8760 hours CBG: No results found for this basename: GLUCAP,  in the last 168 hours  No results found for this or any previous visit (from the past 240 hour(s)).   Studies: No results found.  Scheduled Meds: . amLODipine  10 mg Oral Daily  . feeding supplement (RESOURCE BREEZE)  1 Container Oral TID BM  . nystatin cream  1 application Topical TID  . pantoprazole  40 mg Oral Daily   Continuous Infusions: . sodium chloride 125 mL/hr at 02/07/14 0842       Time spent: 25 minutes    Nicolas [name]  [facility] Pager [phone]. If 7PM-7AM, please contact night-coverage at [email or web address], password [facility] 02/07/2014, 12:14 PM  LOS: 1 day

## 2014-02-07 NOTE — Progress Notes (Signed)
UR completed 

## 2014-02-07 NOTE — Progress Notes (Signed)
Hydrocodone ordered PRN for patient.  Patient refused Hydrocodone.  Stated that he was taking this at home, and it did not relieve his pain at all.  Stated that he pain was better controlled during his last visit.  Patient asked about the pain medication he received during the last visit.  Dr. Gonzella Lex notified via text page.

## 2014-02-07 NOTE — Progress Notes (Signed)
Patient refuses Hydrocodone. Requests something else for pain.  Stated he may leave.  Dr. Gonzella Lex notified.  Orders to follow.

## 2014-02-07 NOTE — Progress Notes (Addendum)
INITIAL NUTRITION ASSESSMENT  DOCUMENTATION CODES Per approved criteria  -Not Applicable   INTERVENTION: Resource Breeze po TID, each supplement provides 250 kcal and 9 grams of protein  NUTRITION DIAGNOSIS: Inadequate oral intake related to altered GI function as evidenced by poor po PTA, 4% wt loss x 1 week.   Goal: Pt will meet >90% of estimated nutritional needs  Monitor:  Diet advancement, PO/supplement intake, labs, weight changes, I/O's  Reason for Assessment: MST=3  38 y.o. male  Admitting Dx: <principal problem not specified>  Nicolas [name] is a 38 y.o. male  With a history of hypertension, recent cardiac arrest with normal cardiac catheterization, and recent hospitalization for acute pancreatitis 1 week ago. The patient was discharged from the hospital 5 days ago with resolution of his abdominal pain. Approximately a day and a half ago, pain started to return in his left upper quadrant and epigastric area with radiation into his back. Movement and eating increased his pain. He has been unable to sleep over the past day and a half and reports a 22 pound weight loss over the past 2-3 weeks. Patient absolutely denies alcohol and other drug use.  ASSESSMENT: Pt admitted with pancreatitis.  Pt with poor appetite PTA due to abdominal pain. Documented wt hx reveals a 4% wt loss in the past week. This is likely due to dehydration and poor po intake due to pancreatitis. Chart review suggests that pt was trying to lose weight prior to illness through lifestyle changes, due to hx of HTN. Pt was recently started on a clear liquid diet. No intake data currently available, however, he has been tolerating water and ice chips well.   Height: Ht Readings from Last 1 Encounters:  02/06/14  (1.905 m)    Weight: Wt Readings from Last 1 Encounters:  02/07/14 237 lb 15.8 oz (107.95 kg)    Ideal Body Weight: 196#  % Ideal Body Weight: 121%  Wt Readings from Last 10 Encounters:   02/07/14 237 lb 15.8 oz (107.95 kg)  01/30/14 247 lb 2.2 oz (112.1 kg)  01/23/14 257 lb (116.574 kg)  01/18/14 269 lb 6.4 oz (122.2 kg)  01/18/14 269 lb 6.4 oz (122.2 kg)  12/21/13 265 lb (120.203 kg)  10/30/12 265 lb (120.203 kg)  10/24/12 274 lb 6.4 oz (124.467 kg)  03/21/12 270 lb (122.471 kg)  03/03/12 265 lb (120.203 kg)    Usual Body Weight: 265#  % Usual Body Weight: 89%  BMI:  Body mass index is 29.75 kg/(m^2). Overweight  Estimated Nutritional Needs: Kcal: 2200-2400 Protein: 120-130 grams Fluid: 2.2-2.4 L  Skin: Intact  Diet Order: Clear Liquid  EDUCATION NEEDS: -Education not appropriate at this time   Intake/Output Summary (Last 24 hours) at 02/07/14 1153 Last data filed at 02/07/14 0800  Gross per 24 hour  Intake      0 ml  Output      0 ml  Net      0 ml    Last BM: 02/05/14  Labs:   Recent Labs Lab 02/01/14 0604 02/06/14 1446 02/07/14 0612  NA 140 139 139  K 4.1 4.7 4.3  CL 102 100 102  CO2 BUN CREATININE 0.95 0.91 0.87  CALCIUM 9.3 10.5 9.3  GLUCOSE 90 105* 82    CBG (last 3)  No results found for this basename: GLUCAP,  in the last 72 hours  Scheduled Meds: . amLODipine  10 mg Oral Daily  .  nystatin cream  1 application Topical TID  . pantoprazole  40 mg Oral Daily    Continuous Infusions: . sodium chloride 125 mL/hr at 02/07/14 6789    Past Medical History  Diagnosis Date  . Hypertension   . Kidney stone   . Pancreatitis     Past Surgical History  Procedure Laterality Date  . Cardiac catheterization      Kani Chauvin A. Mayford Knife, RD, LDN Pager: [phone]

## 2014-02-07 NOTE — Progress Notes (Signed)
Patient states that he is hungry this morning and would like to try and eat meal.  Dr. Gonzella Lex notified via text page.

## 2014-02-08 DIAGNOSIS — F17200 Nicotine dependence, unspecified, uncomplicated: Secondary | ICD-10-CM | POA: Diagnosis present

## 2014-02-08 DIAGNOSIS — I10 Essential (primary) hypertension: Secondary | ICD-10-CM

## 2014-02-08 DIAGNOSIS — R112 Nausea with vomiting, unspecified: Secondary | ICD-10-CM

## 2014-02-08 MED ORDER — BOOST / RESOURCE BREEZE PO LIQD: 1.0000 | Freq: Three times a day (TID) | ORAL | Status: DC

## 2014-02-08 MED ORDER — NICOTINE 21 MG/24HR TD PT24
21.0000 mg | MEDICATED_PATCH | Freq: Every day | TRANSDERMAL | Status: DC
Start: 2014-02-08 — End: 2014-02-09

## 2014-02-08 MED ORDER — HYDROCODONE-ACETAMINOPHEN 7.5-325 MG PO TABS: 2.0000 | ORAL_TABLET | ORAL | Status: DC | PRN

## 2014-02-08 NOTE — Discharge Instructions (Addendum)
Low-Fat Diet for Pancreatitis or Gallbladder Conditions °A low-fat diet can be helpful if you have pancreatitis or a gallbladder condition. With these conditions, your pancreas and gallbladder have trouble digesting fats. A healthy eating plan with less fat will help rest your pancreas and gallbladder and reduce your symptoms. °WHAT DO I NEED TO KNOW ABOUT THIS DIET? °· Eat a low-fat diet. °¨ Reduce your fat intake to less than 20-30% of your total daily calories. This is less than 50-60 g of fat per day. °¨ Remember that you need some fat in your diet. Ask your dietician what your daily goal should be. °¨ Choose nonfat and low-fat healthy foods. Look for the words "nonfat," "low fat," or "fat free." °¨ As a guide, look on the label and choose foods with less than 3 g of fat per serving. Eat only one serving. °· Avoid alcohol. °· Do not smoke. If you need help quitting, talk with your health care provider. °· Eat small frequent meals instead of three large heavy meals. °WHAT FOODS CAN I EAT? °Grains °Include healthy grains and starches such as potatoes, wheat bread, fiber-rich cereal, and brown rice. Choose whole grain options whenever possible. In adults, whole grains should account for 45-65% of your daily calories.  °Fruits and Vegetables °Eat plenty of fruits and vegetables. Fresh fruits and vegetables add fiber to your diet. °Meats and Other Protein Sources °Eat lean meat such as chicken and pork. Trim any fat off of meat before cooking it. Eggs, fish, and beans are other sources of protein. In adults, these foods should account for 10-35% of your daily calories. °Dairy °Choose low-fat milk and dairy options. Dairy includes fat and protein, as well as calcium.  °Fats and Oils °Limit high-fat foods such as fried foods, sweets, baked goods, sugary drinks.  °Other °Creamy sauces and condiments, such as mayonnaise, can add extra fat. Think about whether or not you need to use them, or use smaller amounts or low fat  options. °WHAT FOODS ARE NOT RECOMMENDED? °· High fat foods, such as: °¨ Baked goods. °¨ Ice cream. °¨ French toast. °¨ Sweet rolls. °¨ Pizza. °¨ Cheese bread. °¨ Foods covered with batter, butter, creamy sauces, or cheese. °¨ Fried foods. °¨ Sugary drinks and desserts. °· Foods that cause gas or bloating °Document Released: 06/03/2013 Document Reviewed: 06/03/2013 °[employer]® Patient Information ©2015 [employer]. This information is not intended to replace advice given to you by your health care provider. Make sure you discuss any questions you have with your health care provider. ° °

## 2014-02-08 NOTE — Discharge Summary (Signed)
Physician Discharge Summary  Lari Zerby VQQ:[id number] DOB: 06-07-1976 DOA: 02/06/2014  PCP: Lilyan Punt, MD  Admit date: 02/06/2014 Discharge date: 02/08/2014  Time spent: 30 minutes  Recommendations for Outpatient Follow-up:  1. D/c home with outtp PCP follow up. Has appt on 9//9/ 2015. recommend MRCP of abdomen as otpt  Discharge Diagnoses:  Principal Problem:   Acute pancreatitis  Active Problems:   Essential hypertension, benign   Obesity   Nausea with vomiting   Discharge Condition: fair  Diet recommendation: low fat diet ( meal resources provided)  Filed Weights   02/06/14 1435 02/06/14 1826 02/07/14 0647  Weight: 112.038 kg (247 lb) 107.956 kg (238 lb) 107.95 kg (237 lb 15.8 oz)    History of present illness:  Please refer to admission H&P fr details, but in brief, 38 y.o. male with a history of hypertension, recent cardiac arrest with normal cardiac catheterization, and recent hospitalization for acute pancreatitis 1 week ago. The patient was discharged from the hospital 5 days ago with resolution of his abdominal pain. Approximately a day and a half ago, pain started to return in his left upper quadrant and epigastric area with radiation into his back. Movement and eating increased his pain. He has been unable to sleep over the past day and a half and reports a 22 pound weight loss over the past 2-3 weeks. Patient absolutely denies alcohol and other drug use.   Hospital Course:  Acute recurrent pancreatitis  No clear etiology. Recent hospitalization for the same. Patient denies any alcohol use. Was taken off enalapril with suspicion of causing his symptoms during last hospitalization. Triglycerides are normal. LFTs except for elevated alkaline phosphatase. CT scan and ultrasound abdomen from recent hospitalization have been unremarkable.  -Given recurrent pancreatitis and elevated alkaline phosphatase would recommend MRCP as outpatient . -patient tolerating  advancing diet. Has some soreness of abdomen. Will discharge him on increased dose of vicodin. instructed and provided resources on on low fat diet.   Hypertension  Continue amlodipine   Recent hospitalization for cardiac arrest  Pt admitted with STEMI and had a normal cardiac cath   Tobacco abuse  smokes 1-1/12 PPD. Counseled on cessation. Also counsoled on quitting drinking. prescribed nicotine patch.    Family Communication: wife at bedside   Disposition Plan: Home    Discharge Exam: Filed Vitals:   02/08/14 0613  BP: 146/79  Pulse: 82  Temp: 98.5 F (36.9 C)  Resp: 20    General: Middle aged obese male in no acute distress  HEENT: No pallor, moist oral mucosa  Chest: Clear to auscultation bilaterally  CVS: Normal S1 and S2  Abdomen: Soft, nondistended, mild epigastric tenderness, bowel sounds present  Extremities: Warm, no edema    Discharge Instructions You were cared for by a hospitalist during your hospital stay. If you have any questions about your discharge medications or the care you received while you were in the hospital after you are discharged, you can call the unit and asked to speak with the hospitalist on call if the hospitalist that took care of you is not available. Once you are discharged, your primary care physician will handle any further medical issues. Please note that NO REFILLS for any discharge medications will be authorized once you are discharged, as it is imperative that you return to your primary care physician (or establish a relationship with a primary care physician if you do not have one) for your aftercare needs so that they can reassess your need for medications  and monitor your lab values.     Medication List         amLODipine 10 MG tablet  Commonly known as:  NORVASC  Take 1 tablet (10 mg total) by mouth daily.     feeding supplement (RESOURCE BREEZE) Liqd  Take 1 Container by mouth 3 (three) times daily between meals.      HYDROcodone-acetaminophen 7.5-325 MG per tablet  Commonly known as:  NORCO  Take 2 tablets by mouth every 4 (four) hours as needed for severe pain (pain).     nicotine 21 mg/24hr patch  Commonly known as:  NICODERM CQ  Place 1 patch (21 mg total) onto the skin daily.     nystatin cream  Commonly known as:  MYCOSTATIN  Apply 1 application topically 3 (three) times daily. Apply tid groin area prn     pantoprazole 40 MG tablet  Commonly known as:  PROTONIX  Take 1 tablet (40 mg total) by mouth daily.     promethazine 25 MG tablet  Commonly known as:  PHENERGAN  Take 1 tablet (25 mg total) by mouth every 8 (eight) hours as needed for nausea or vomiting.       No Known Allergies     Follow-up Information   Follow up with Lilyan Punt, MD On 02/18/2014.   Specialty:  Family Medicine   Contact information:   [address] [place] [address] [phone]        The results of significant diagnostics from this hospitalization (including imaging, microbiology, ancillary and laboratory) are listed below for reference.    Significant Diagnostic Studies: Dg Chest 2 View  01/19/2014   CLINICAL DATA:  Chest pain; recent cardiac arrest  EXAM: CHEST  2 VIEW  COMPARISON:  January 18, 2014  FINDINGS: Patchy atelectasis is noted in the left base region. Elsewhere lungs are clear. Heart is mildly enlarged with pulmonary vascularity within normal limits. No adenopathy. No bone lesions are appreciable. There is degenerative change in the thoracic spine. No pneumothorax.  IMPRESSION: Areas of left lower lobe atelectatic change. Lungs elsewhere clear. No apparent pneumothorax. Stable mild cardiac prominence.   Electronically Signed   By: Bretta Bang M.D.   On: 01/19/2014 07:35   Ct Head Wo Contrast  01/18/2014   CLINICAL DATA:  Altered mental status. Status post cardiac catheterization.  EXAM: CT HEAD WITHOUT CONTRAST  TECHNIQUE: Contiguous axial images were obtained from the base of  the skull through the vertex without intravenous contrast.  COMPARISON:  None.  FINDINGS: There is no evidence of acute infarction, mass lesion, or intra- or extra-axial hemorrhage on CT.  The posterior fossa, including the cerebellum, brainstem and fourth ventricle, is within normal limits. The third and lateral ventricles, and basal ganglia are unremarkable in appearance. The cerebral hemispheres are symmetric in appearance, with normal gray-white differentiation. No mass effect or midline shift is seen.  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 The visualized portions of the orbits are within normal limits. The paranasal sinuses and mastoid air cells are well-aerated. No significant soft tissue abnormalities are seen.  IMPRESSION: 1. No acute intracranial pathology seen on CT. 2. 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   Electronically Signed   By: Roanna Raider M.D.   On: 01/18/2014 06:23   Ct Abdomen Pelvis W Contrast  01/30/2014   CLINICAL DATA:  Upper abdominal pain since undergoing CPR 2 weeks ago.  EXAM: CT ABDOMEN  AND PELVIS WITH CONTRAST  TECHNIQUE: Multidetector CT imaging of the abdomen and pelvis was performed using the standard protocol following bolus administration of intravenous contrast.  CONTRAST:  50 mL OMNIPAQUE IOHEXOL 300 MG/ML  SOLN  COMPARISON:  CT abdomen and pelvis 03/03/2012 and 01/10/2010.  FINDINGS: Lung bases are clear.  No pleural or pericardial effusion.  The gallbladder, liver, spleen, pancreas and right kidney are unremarkable. Calcified adrenal glands are consistent with prior infection or hemorrhage. Tiny exophytic lesion off the upper pole left kidney is likely a cyst and unchanged. The stomach, small and large bowel and appendix all appear normal. No fracture or worrisome bony lesion is seen. Small sclerotic lesion in the proximal right femur is unchanged and consistent with a bone island.  IMPRESSION: No  acute finding.  Stable compared to prior exam.   Electronically Signed   By: Drusilla Kanner M.D.   On: 01/30/2014 15:26   US Abdomen Limited  01/30/2014   CLINICAL DATA:  Abdominal pain for 2 weeks.  EXAM: US ABDOMEN LIMITED - RIGHT UPPER QUADRANT  COMPARISON:  CT abdomen and pelvis 03/03/2012.  FINDINGS: Gallbladder:  No gallstones or wall thickening visualized. No sonographic Murphy sign noted.  Common bile duct:  Diameter: 0.3 cm.  Liver:  No focal lesion identified. Within normal limits in parenchymal echogenicity.  IMPRESSION: Negative for gallstones.  Negative examination.   Electronically Signed   By: Drusilla Kanner M.D.   On: 01/30/2014 14:56   Dg Chest Portable 1 View  01/18/2014   CLINICAL DATA:  Myocardial infarction, chest pain  EXAM: PORTABLE CHEST - 1 VIEW  COMPARISON:  10/30/2012  FINDINGS: Heart size upper normal to mildly enlarged. Vascular pattern normal. Lungs clear.  IMPRESSION: No acute finding   Electronically Signed   By: Esperanza Heir M.D.   On: 01/18/2014 01:46    Microbiology: No results found for this or any previous visit (from the past 240 hour(s)).   Labs: Basic Metabolic Panel:  Recent Labs Lab 02/06/14 1446 02/07/14 0612  NA 139 139  K 4.7 4.3  CL 100 102  CO2 28 27  GLUCOSE 105* 82  BUN 14 14  CREATININE 0.91 0.87  CALCIUM 10.5 9.3   Liver Function Tests:  Recent Labs Lab 02/06/14 1446  AST 16  ALT 26  ALKPHOS 156*  BILITOT 0.5  PROT 7.7  ALBUMIN 3.9    Recent Labs Lab 02/06/14 1446 02/07/14 0612  LIPASE 170* 60*  AMYLASE 102  --    No results found for this basename: AMMONIA,  in the last 168 hours CBC:  Recent Labs Lab 02/06/14 1446 02/07/14 0612  WBC 11.2* 11.8*  NEUTROABS 7.5  --   HGB 16.0 13.6  HCT 43.9 38.9*  MCV 87.8 89.4  PLT 402* 346   Cardiac Enzymes: No results found for this basename: CKTOTAL, CKMB, CKMBINDEX, TROPONINI,  in the last 168 hours BNP: BNP (last 3 results) No results found for this  basename: PROBNP,  in the last 8760 hours CBG: No results found for this basename: GLUCAP,  in the last 168 hours     Signed:  Airika Alkhatib  [facility] 02/08/2014, 10:56 AM

## 2014-02-08 NOTE — Progress Notes (Signed)
AVS reviewed with patient.  Verbalized understanding of discharge instructions, physician follow-up, and medications.  Prescription provided to patient.  IV removed.  Site WNL.  Patient transported by NT via w/c to main entrance for discharge.  Patient stable at time of discharge.

## 2014-02-09 ENCOUNTER — Emergency Department (HOSPITAL_COMMUNITY): Payer: Self-pay

## 2014-02-09 ENCOUNTER — Inpatient Hospital Stay (HOSPITAL_COMMUNITY)
Admission: EM | Admit: 2014-02-09 | Discharge: 2014-02-12 | DRG: 383 | Disposition: A | Discharge status: Home / Self Care | Payer: 59 | Attending: Family Medicine | Admitting: Family Medicine

## 2014-02-09 ENCOUNTER — Encounter (HOSPITAL_COMMUNITY): Payer: Self-pay | Admitting: Emergency Medicine

## 2014-02-09 DIAGNOSIS — K2100 Gastro-esophageal reflux disease with esophagitis, without bleeding: Secondary | ICD-10-CM | POA: Diagnosis present

## 2014-02-09 DIAGNOSIS — R1110 Vomiting, unspecified: Secondary | ICD-10-CM

## 2014-02-09 DIAGNOSIS — K253 Acute gastric ulcer without hemorrhage or perforation: Secondary | ICD-10-CM

## 2014-02-09 DIAGNOSIS — I219 Acute myocardial infarction, unspecified: Secondary | ICD-10-CM | POA: Diagnosis present

## 2014-02-09 DIAGNOSIS — K279 Peptic ulcer, site unspecified, unspecified as acute or chronic, without hemorrhage or perforation: Secondary | ICD-10-CM | POA: Diagnosis present

## 2014-02-09 DIAGNOSIS — Z6829 Body mass index (BMI) 29.0-29.9, adult: Secondary | ICD-10-CM

## 2014-02-09 DIAGNOSIS — E860 Dehydration: Secondary | ICD-10-CM | POA: Diagnosis present

## 2014-02-09 DIAGNOSIS — R51 Headache: Secondary | ICD-10-CM | POA: Diagnosis present

## 2014-02-09 DIAGNOSIS — E669 Obesity, unspecified: Secondary | ICD-10-CM | POA: Diagnosis present

## 2014-02-09 DIAGNOSIS — I10 Essential (primary) hypertension: Secondary | ICD-10-CM | POA: Diagnosis present

## 2014-02-09 DIAGNOSIS — F17200 Nicotine dependence, unspecified, uncomplicated: Secondary | ICD-10-CM | POA: Diagnosis present

## 2014-02-09 DIAGNOSIS — K861 Other chronic pancreatitis: Secondary | ICD-10-CM | POA: Diagnosis present

## 2014-02-09 DIAGNOSIS — R1115 Cyclical vomiting syndrome unrelated to migraine: Secondary | ICD-10-CM

## 2014-02-09 DIAGNOSIS — K259 Gastric ulcer, unspecified as acute or chronic, without hemorrhage or perforation: Principal | ICD-10-CM | POA: Diagnosis present

## 2014-02-09 DIAGNOSIS — E43 Unspecified severe protein-calorie malnutrition: Secondary | ICD-10-CM | POA: Diagnosis present

## 2014-02-09 DIAGNOSIS — R112 Nausea with vomiting, unspecified: Secondary | ICD-10-CM | POA: Insufficient documentation

## 2014-02-09 DIAGNOSIS — K449 Diaphragmatic hernia without obstruction or gangrene: Secondary | ICD-10-CM | POA: Diagnosis present

## 2014-02-09 LAB — RAPID URINE DRUG SCREEN, HOSP PERFORMED
Amphetamines: NOT DETECTED
Barbiturates: NOT DETECTED
Benzodiazepines: NOT DETECTED
Cocaine: NOT DETECTED
Opiates: POSITIVE — AB
Tetrahydrocannabinol: NOT DETECTED

## 2014-02-09 LAB — COMPREHENSIVE METABOLIC PANEL
ALT: 23 U/L (ref 0–53)
ANION GAP: 18 — AB (ref 5–15)
AST: 13 U/L (ref 0–37)
Albumin: 4.3 g/dL (ref 3.5–5.2)
Alkaline Phosphatase: 165 U/L — ABNORMAL HIGH (ref 39–117)
BUN: 15 mg/dL (ref 6–23)
CALCIUM: 10.8 mg/dL — AB (ref 8.4–10.5)
CO2: 27 mEq/L (ref 19–32)
Chloride: 96 mEq/L (ref 96–112)
Creatinine, Ser: 0.86 mg/dL (ref 0.50–1.35)
GFR calc non Af Amer: >90 mL/min (ref >90)
GLUCOSE: 124 mg/dL — AB (ref 70–99)
Potassium: 3.5 mEq/L — ABNORMAL LOW (ref 3.7–5.3)
Sodium: 141 mEq/L (ref 137–147)
Total Bilirubin: 0.5 mg/dL (ref 0.3–1.2)
Total Protein: 8.4 g/dL — ABNORMAL HIGH (ref 6.0–8.3)

## 2014-02-09 LAB — URINALYSIS, ROUTINE W REFLEX MICROSCOPIC
Glucose, UA: NEGATIVE mg/dL
Hgb urine dipstick: NEGATIVE
KETONES UR: 40 mg/dL — AB
LEUKOCYTES UA: NEGATIVE
Nitrite: NEGATIVE
PROTEIN: NEGATIVE mg/dL
Specific Gravity, Urine: 1.02 (ref 1.005–1.030)
Urobilinogen, UA: 4 mg/dL — ABNORMAL HIGH (ref 0.0–1.0)
pH: 6.5 (ref 5.0–8.0)

## 2014-02-09 LAB — CBC WITH DIFFERENTIAL/PLATELET
Basophils Absolute: 0 10*3/uL (ref 0.0–0.1)
Basophils Relative: 0 % (ref 0–1)
EOS ABS: 0.1 10*3/uL (ref 0.0–0.7)
EOS PCT: 1 % (ref 0–5)
HCT: 46.4 % (ref 39.0–52.0)
Hemoglobin: 17 g/dL (ref 13.0–17.0)
LYMPHS ABS: 2 10*3/uL (ref 0.7–4.0)
Lymphocytes Relative: 11 % — ABNORMAL LOW (ref 12–46)
MCH: 31.8 pg (ref 26.0–34.0)
MCHC: 36.6 g/dL — ABNORMAL HIGH (ref 30.0–36.0)
MCV: 86.9 fL (ref 78.0–100.0)
MONOS PCT: 6 % (ref 3–12)
Monocytes Absolute: 1.2 10*3/uL — ABNORMAL HIGH (ref 0.1–1.0)
Neutro Abs: 15.4 10*3/uL — ABNORMAL HIGH (ref 1.7–7.7)
Neutrophils Relative %: 82 % — ABNORMAL HIGH (ref 43–77)
PLATELETS: 459 10*3/uL — AB (ref 150–400)
RBC: 5.34 MIL/uL (ref 4.22–5.81)
RDW: 13.1 % (ref 11.5–15.5)
WBC: 18.7 10*3/uL — ABNORMAL HIGH (ref 4.0–10.5)

## 2014-02-09 LAB — LIPASE, BLOOD: Lipase: 52 U/L (ref 11–59)

## 2014-02-09 LAB — ETHANOL: Alcohol, Ethyl (B): <11 mg/dL (ref 0–11)

## 2014-02-09 MED ORDER — PANTOPRAZOLE SODIUM 40 MG IV SOLR
40.0000 mg | Freq: Once | INTRAVENOUS | Status: AC
  Administered 2014-02-09: 40 mg via INTRAVENOUS
  Filled 2014-02-09: qty 40

## 2014-02-09 MED ORDER — PANTOPRAZOLE SODIUM 40 MG IV SOLR
INTRAVENOUS | Status: AC
Start: 2014-02-09 — End: 2014-02-09
  Filled 2014-02-09: qty 80

## 2014-02-09 MED ORDER — ONDANSETRON HCL 4 MG/2ML IJ SOLN
4.0000 mg | Freq: Once | INTRAMUSCULAR | Status: AC
  Administered 2014-02-09: 4 mg via INTRAVENOUS
  Filled 2014-02-09: qty 2

## 2014-02-09 MED ORDER — HYDROMORPHONE HCL PF 1 MG/ML IJ SOLN
1.0000 mg | INTRAMUSCULAR | Status: AC | PRN
  Administered 2014-02-09 (×3): 1 mg via INTRAVENOUS
  Filled 2014-02-09 (×3): qty 1

## 2014-02-09 MED ORDER — DEXTROSE-NACL 5-0.45 % IV SOLN
INTRAVENOUS | Status: AC
  Administered 2014-02-09: via INTRAVENOUS

## 2014-02-09 MED ORDER — ENOXAPARIN SODIUM 40 MG/0.4ML ~~LOC~~ SOLN
40.0000 mg | SUBCUTANEOUS | Status: DC
  Administered 2014-02-10: 40 mg via SUBCUTANEOUS
  Filled 2014-02-09: qty 0.4

## 2014-02-09 MED ORDER — HYDROMORPHONE HCL PF 1 MG/ML IJ SOLN
1.0000 mg | INTRAMUSCULAR | Status: DC | PRN
  Administered 2014-02-09 – 2014-02-11 (×12): 1 mg via INTRAVENOUS
  Filled 2014-02-09 (×12): qty 1

## 2014-02-09 MED ORDER — ONDANSETRON HCL 4 MG/2ML IJ SOLN
4.0000 mg | Freq: Four times a day (QID) | INTRAMUSCULAR | Status: DC | PRN
Start: 2014-02-09 — End: 2014-02-12

## 2014-02-09 MED ORDER — PANTOPRAZOLE SODIUM 40 MG IV SOLR: 40.0000 mg | Freq: Two times a day (BID) | INTRAVENOUS | Status: DC

## 2014-02-09 MED ORDER — SODIUM CHLORIDE 0.9 % IV SOLN
8.0000 mg/h | INTRAVENOUS | Status: DC
  Administered 2014-02-09 – 2014-02-10 (×3): 8 mg/h via INTRAVENOUS
  Filled 2014-02-09 (×7): qty 80

## 2014-02-09 MED ORDER — HYDRALAZINE HCL 20 MG/ML IJ SOLN: 10.0000 mg | Freq: Four times a day (QID) | INTRAMUSCULAR | Status: DC | PRN

## 2014-02-09 MED ORDER — SODIUM CHLORIDE 0.9 % IV SOLN
1000.0000 mL | Freq: Once | INTRAVENOUS | Status: AC
  Administered 2014-02-09: 1000 mL via INTRAVENOUS

## 2014-02-09 MED ORDER — SODIUM CHLORIDE 0.9 % IV SOLN: 1000.0000 mL | Freq: Once | INTRAVENOUS | Status: DC

## 2014-02-09 MED ORDER — ONDANSETRON HCL 4 MG PO TABS
4.0000 mg | ORAL_TABLET | Freq: Four times a day (QID) | ORAL | Status: DC | PRN
  Filled 2014-02-09: qty 1

## 2014-02-09 MED ORDER — SODIUM CHLORIDE 0.9 % IV SOLN
1000.0000 mL | INTRAVENOUS | Status: DC
  Administered 2014-02-09: 1000 mL via INTRAVENOUS

## 2014-02-09 MED ORDER — IOHEXOL 300 MG/ML  SOLN
100.0000 mL | Freq: Once | INTRAMUSCULAR | Status: AC | PRN
  Administered 2014-02-09: 100 mL via INTRAVENOUS

## 2014-02-09 NOTE — H&P (Addendum)
PCP:   Lilyan Punt, MD   Chief Complaint:  Nausea and vomiting  HPI: 38 year old male who   has a past medical history of Hypertension; Kidney stone; and Pancreatitis. patient was discharged on 02/08/2014 after he was treated for nausea and vomiting thought to be due to recurrent pancreatitis. Patient says that he started having nausea and vomiting when he reached home, also had abdominal pain 10/10 in intensity. Patient had 10 episodes of vomiting today. He denies any blood in the vomitus. No diarrhea. No chest pain but admits to having some shortness of breath. Repeat CT scan of the abdomen and pelvis was done in the ED which showed progressive NAME in the gastric antrum with focal ulceration suggesting peptic ulcer disease versus ulcerated gastric carcinoma. Also showed inflammatory erythematous changes inferior to the pylorus with additional adenopathy.   Allergies:  No Known Allergies    Past Medical History  Diagnosis Date  . Hypertension   . Kidney stone   . Pancreatitis     Past Surgical History  Procedure Laterality Date  . Cardiac catheterization      Prior to Admission medications   Medication Sig Start Date End Date Taking? Authorizing Provider  amLODipine (NORVASC) 10 MG tablet Take 1 tablet (10 mg total) by mouth daily. 02/01/14  Yes Elliot Cousin, MD  HYDROcodone-acetaminophen (NORCO) 7.5-325 MG per tablet Take 2 tablets by mouth every 4 (four) hours as needed for severe pain (pain). 02/08/14  Yes Nishant Dhungel, MD  nystatin cream (MYCOSTATIN) Apply 1 application topically 3 (three) times daily. Apply tid groin area prn 01/23/14  Yes Scott A Luking, MD  pantoprazole (PROTONIX) 40 MG tablet Take 1 tablet (40 mg total) by mouth daily. 02/01/14  Yes Elliot Cousin, MD  promethazine (PHENERGAN) 25 MG tablet Take 1 tablet (25 mg total) by mouth every 8 (eight) hours as needed for nausea or vomiting. 01/23/14  Yes [name], MD    Social History:  reports that he  has been smoking Cigarettes.  He has a 15 pack-year smoking history. He has never used smokeless tobacco. He reports that he does not drink alcohol or use illicit drugs.  Family History  Problem Relation Age of Onset  . Cancer Mother     Breast     All the positives are listed in BOLD  Review of Systems:  HEENT: Headache, blurred vision, runny nose, sore throat Neck: Hypothyroidism, hyperthyroidism,,lymphadenopathy Chest : Shortness of breath, history of COPD, Asthma Heart : Chest pain, history of coronary arterey disease GI:  Nausea, vomiting, diarrhea, constipation, GERD GU: Dysuria, urgency, frequency of urination, hematuria Neuro: Stroke, seizures, syncope Psych: Depression, anxiety, hallucinations   Physical Exam: Blood pressure 176/111, pulse 106, temperature 98.9 F (37.2 C), temperature source Oral, resp. rate 20, height  (1.905 m), weight 107.956 kg (238 lb), SpO2 95.00%. Constitutional:   Patient is a well-developed and well-nourished *male in no acute distress and cooperative with exam. Head: Normocephalic and atraumatic Mouth: Mucus membranes moist Eyes: PERRL, EOMI, conjunctivae normal Neck: Supple, No Thyromegaly Cardiovascular: RRR, S1 normal, S2 normal Pulmonary/Chest: CTAB, no wheezes, rales, or rhonchi Abdominal: Soft. positive tenderness to palpation in the epigastric region, non-distended, bowel sounds are normal, no masses, organomegaly, or guarding present.  Neurological: A&O x3, Strenght is normal and symmetric bilaterally, cranial nerve II-XII are grossly intact, no focal motor deficit, sensory intact to light touch bilaterally.  Extremities : No Cyanosis, Clubbing or Edema  Labs on Admission:  Basic Metabolic Panel:  Recent Labs Lab 02/06/14 1446 02/07/14 0612 02/09/14 1822  NA 139 139 141  K 4.7 4.3 3.5*  CL 100 102 96  CO2 28 27 27   GLUCOSE 105* 82 124*  BUN 14 14 15   CREATININE 0.91 0.87 0.86  CALCIUM 10.5 9.3 10.8*   Liver  Function Tests:  Recent Labs Lab 02/06/14 1446 02/09/14 1822  AST 16 13  ALT 26 23  ALKPHOS 156* 165*  BILITOT 0.5 0.5  PROT 7.7 8.4*  ALBUMIN 3.9 4.3    Recent Labs Lab 02/06/14 1446 02/07/14 0612 02/09/14 1822  LIPASE 170* 60* 52  AMYLASE 102  --   --    No results found for this basename: AMMONIA,  in the last 168 hours CBC:  Recent Labs Lab 02/06/14 1446 02/07/14 0612 02/09/14 1822  WBC 11.2* 11.8* 18.7*  NEUTROABS 7.5  --  15.4*  HGB 16.0 13.6 17.0  HCT 43.9 38.9* 46.4  MCV 87.8 89.4 86.9  PLT 402* 346 459*    Radiological Exams on Admission: Ct Abdomen Pelvis W Contrast  02/09/2014   CLINICAL DATA:  ABDOMINAL PAIN EMESIS  EXAM: CT ABDOMEN AND PELVIS WITH CONTRAST  TECHNIQUE: Multidetector CT imaging of the abdomen and pelvis was performed using the standard protocol following bolus administration of intravenous contrast.  CONTRAST:  OMNIPAQUE IOHEXOL 300 MG/ML  SOLN  COMPARISON:  01/30/2014  FINDINGS: Visualized lung bases clear.  Some increase in inflammatory/edematous changes between the gallbladder and pancreatic head, inferior to the pylorus. Wall thickening in the gastric antrum will is suspected. There is a focal outpouching from the gastric lumen along the greater curvature suggesting ulceration. Regional prominent lymph nodes measuring up to 13 mm short axis diameter image 31/2. No focal fluid collection or free air.  Unremarkable liver, spleen, kidneys. Chronic will will Linear coarse calcifications in both adrenal glands as before. There is homogeneous pancreatic parenchymal enhancement without ductal dilatation. Unremarkable aorta and portal vein. Stomach is physiologically distended. Small bowel and colon are nondilated. Normal appendix. Urinary bladder nondistended. Bilateral pelvic phleboliths. No ascites. Subcentimeter right lower quadrant mesenteric, left para-aortic, aortocaval, and central mesenteric lymph nodes. Lumbar spine unremarkable.   IMPRESSION: 1. Progressive wall thickening in the gastric antrum with focal ulceration suggesting peptic ulcer disease versus ulcerated gastric carcinoma. Recommend endoscopy for further evaluation. 2. Inflammatory/edematous changes inferior to the pylorus with regional adenopathy, likely secondary to the gastric process. No evidence of perforation or abscess.   Electronically Signed   By: Oley Balm M.D.   On: 02/09/2014 20:33       Assessment/Plan Active Problems:   Essential hypertension, benign   Nausea with vomiting   Tobacco use disorder   Gastric ulcer  Recurrent abdominal pain Patient having recurrent episodes of abdominal pain, initially suspicious for pancreatitis. To do a CT scan showed gastric wall thickening with ulceration. We'll keep the patient n.p.o. start protonix infusion, GI consultation. We'll start Dilaudid 1 mg every 2 hours when necessary for pain  Nausea vomiting Likely due to above, will start Zofran when necessary for nausea and vomiting  Hypertension Hold by mouth medications, start hydralazine 10 mg IV every 6 hours when necessary for BP greater than 160/100.  Leukocytosis- Likely reactive, from dehydration. He is afebrile, UA is clear. Will follow cbc in am.  Code status: patient is full code  Family discussion: Admission, patients condition and plan of care including tests being ordered have been discussed with the patient and *his wife at bedside* who indicate  understanding and agree with the plan and Code Status.   Time Spent on Admission:  60 minutes  Analysse Quinonez S [facility] Pager: [phone] 02/09/2014, 10:02 PM  If 7PM-7AM, please contact night-coverage  [email or web address]  Password [email or web address]

## 2014-02-09 NOTE — ED Notes (Signed)
Pt reports was admitted for pancreatitis again last weekend and c/o severe abd pain and vomiting.  Pt says pain has not gone away.

## 2014-02-09 NOTE — ED Provider Notes (Signed)
CSN: [id number]     Arrival date & time 02/09/14  1730 History  This chart was scribed for Linwood Dibbles, MD by Milly Jakob, ED Scribe. The patient was seen in room APA07/APA07. Patient's care was started at 5:59 PM.   Chief Complaint  Patient presents with  . Abdominal Pain  . Emesis   The history is provided by the patient. No language interpreter was used.   HPI Comments: Nicolas [name] is a 38 y.o. male with a history of pancreatitis who presents to the Emergency Department complaining of severe, constant, upper, abdominal pain which began 8 days ago. He reports associated nausea and vomiting. He states that he was admitted here last week for pancreatitis, and states that his pain has not improved since then. He denies taking any medication for this. He denies any fever, cough, or chest pain.  Past Medical History  Diagnosis Date  . Hypertension   . Kidney stone   . Pancreatitis    Past Surgical History  Procedure Laterality Date  . Cardiac catheterization     Family History  Problem Relation Age of Onset  . Cancer Mother     Breast   History  Substance Use Topics  . Smoking status: Current Every Day Smoker -- 1.50 packs/day for 10 years    Types: Cigarettes  . Smokeless tobacco: Never Used  . Alcohol Use: No    Review of Systems  Gastrointestinal: Positive for nausea, vomiting and abdominal pain.  All other systems reviewed and are negative.   Allergies  Review of patient's allergies indicates no known allergies.  Home Medications   Prior to Admission medications   Medication Sig Start Date End Date Taking? Authorizing Provider  amLODipine (NORVASC) 10 MG tablet Take 1 tablet (10 mg total) by mouth daily. 02/01/14  Yes Elliot Cousin, MD  HYDROcodone-acetaminophen (NORCO) 7.5-325 MG per tablet Take 2 tablets by mouth every 4 (four) hours as needed for severe pain (pain). 02/08/14  Yes Nishant Dhungel, MD  nystatin cream (MYCOSTATIN) Apply 1 application topically 3  (three) times daily. Apply tid groin area prn 01/23/14  Yes Scott A Luking, MD  pantoprazole (PROTONIX) 40 MG tablet Take 1 tablet (40 mg total) by mouth daily. 02/01/14  Yes Elliot Cousin, MD  promethazine (PHENERGAN) 25 MG tablet Take 1 tablet (25 mg total) by mouth every 8 (eight) hours as needed for nausea or vomiting. 01/23/14  Yes [name], MD   Triage Vitals: BP 150/100  Pulse 134  Temp(Src) 98.9 F (37.2 C) (Oral)  Resp 20  Ht  (1.905 m)  Wt 238 lb (107.956 kg)  BMI 29.75 kg/m2  SpO2 100% Physical Exam  Nursing note and vitals reviewed. Constitutional: He appears well-developed and well-nourished.  Uncomfortable appearing.  HENT:  Head: Normocephalic and atraumatic.  Right Ear: External ear normal.  Left Ear: External ear normal.  Eyes: Conjunctivae are normal. Right eye exhibits no discharge. Left eye exhibits no discharge. No scleral icterus.  Neck: Neck supple. No tracheal deviation present.  Cardiovascular: Normal rate, regular rhythm and intact distal pulses.   Pulmonary/Chest: Effort normal and breath sounds normal. No stridor. No respiratory distress. He has no wheezes. He has no rales.  Abdominal: Soft. Bowel sounds are normal. He exhibits no distension. There is tenderness (epigastric). There is no rebound and no guarding.  Musculoskeletal: He exhibits no edema and no tenderness.  Neurological: He is alert. He has normal strength. No cranial nerve deficit (no facial droop, extraocular  movements intact, no slurred speech) or sensory deficit. He exhibits normal muscle tone. He displays no seizure activity. Coordination normal.  Skin: Skin is warm and dry. No rash noted.  Psychiatric: He has a normal mood and affect.    ED Course  Procedures (including critical care time) DIAGNOSTIC STUDIES: Oxygen Saturation is 100% on room air, normal by my interpretation.    COORDINATION OF CARE: 6:02 PM-Discussed treatment plan which includes IV fluids, pian medication,   and nausea medication with pt at bedside and pt agreed to plan.   Labs Review Labs Reviewed  CBC WITH DIFFERENTIAL - Abnormal; Notable for the following:    WBC 18.7 (*)    MCHC 36.6 (*)    Platelets 459 (*)    Neutrophils Relative % 82 (*)    Neutro Abs 15.4 (*)    Lymphocytes Relative 11 (*)    Monocytes Absolute 1.2 (*)    All other components within normal limits  COMPREHENSIVE METABOLIC PANEL - Abnormal; Notable for the following:    Potassium 3.5 (*)    Glucose, Bld 124 (*)    Calcium 10.8 (*)    Total Protein 8.4 (*)    Alkaline Phosphatase 165 (*)    Anion gap 18 (*)    All other components within normal limits  URINALYSIS, ROUTINE W REFLEX MICROSCOPIC - Abnormal; Notable for the following:    Bilirubin Urine SMALL (*)    Ketones, ur 40 (*)    Urobilinogen, UA 4.0 (*)    All other components within normal limits  URINE RAPID DRUG SCREEN (HOSP PERFORMED) - Abnormal; Notable for the following:    Opiates POSITIVE (*)    All other components within normal limits  LIPASE, BLOOD  ETHANOL    Imaging Review Ct Abdomen Pelvis W Contrast  02/09/2014   CLINICAL DATA:  ABDOMINAL PAIN EMESIS  EXAM: CT ABDOMEN AND PELVIS WITH CONTRAST  TECHNIQUE: Multidetector CT imaging of the abdomen and pelvis was performed using the standard protocol following bolus administration of intravenous contrast.  CONTRAST:  OMNIPAQUE IOHEXOL 300 MG/ML  SOLN  COMPARISON:  01/30/2014  FINDINGS: Visualized lung bases clear.  Some increase in inflammatory/edematous changes between the gallbladder and pancreatic head, inferior to the pylorus. Wall thickening in the gastric antrum will is suspected. There is a focal outpouching from the gastric lumen along the greater curvature suggesting ulceration. Regional prominent lymph nodes measuring up to 13 mm short axis diameter image 31/2. No focal fluid collection or free air.  Unremarkable liver, spleen, kidneys. Chronic will will Linear coarse calcifications  in both adrenal glands as before. There is homogeneous pancreatic parenchymal enhancement without ductal dilatation. Unremarkable aorta and portal vein. Stomach is physiologically distended. Small bowel and colon are nondilated. Normal appendix. Urinary bladder nondistended. Bilateral pelvic phleboliths. No ascites. Subcentimeter right lower quadrant mesenteric, left para-aortic, aortocaval, and central mesenteric lymph nodes. Lumbar spine unremarkable.  IMPRESSION: 1. Progressive wall thickening in the gastric antrum with focal ulceration suggesting peptic ulcer disease versus ulcerated gastric carcinoma. Recommend endoscopy for further evaluation. 2. Inflammatory/edematous changes inferior to the pylorus with regional adenopathy, likely secondary to the gastric process. No evidence of perforation or abscess.   Electronically Signed   By: Oley Balm M.D.   On: 02/09/2014 20:33    Medications  0.9 %  sodium chloride infusion (0 mLs Intravenous Stopped 02/09/14 1945)    Followed by  0.9 %  sodium chloride infusion (not administered)    Followed by  0.9 %  sodium chloride infusion (1,000 mLs Intravenous New Bag/Given 02/09/14 1946)  pantoprazole (PROTONIX) injection 40 mg (not administered)  ondansetron (ZOFRAN) injection 4 mg (4 mg Intravenous Given 02/09/14 1803)  ondansetron (ZOFRAN) injection 4 mg (4 mg Intravenous Given 02/09/14 1953)  HYDROmorphone (DILAUDID) injection 1 mg (1 mg Intravenous Given 02/09/14 2103)  ondansetron (ZOFRAN) injection 4 mg (4 mg Intravenous Given 02/09/14 1851)  iohexol (OMNIPAQUE) 300 MG/ML solution 100 mL (100 mLs Intravenous Contrast Given 02/09/14 2014)     MDM   Final diagnoses:  Acute gastric ulcer  Intractable vomiting with nausea, vomiting of unspecified type    Pt has been hospitalized recently several times for pancreatitis and persistent nausea and vomiting.  He just went home a few days ago and returns with recurrent symptoms.  Labs and CT scan do not  show pancreatitis but rather focal ulceration with inflammatory changes inferior to the pylorus.  No perforation noted however he could have a small ulceration that did affect the pancreas causing his pancreatitis earlier.  Will consult regarding admission, arrange for endoscopy.  Linwood Dibbles, MD 02/09/14 2124

## 2014-02-10 ENCOUNTER — Encounter (HOSPITAL_COMMUNITY): Payer: Self-pay | Admitting: Gastroenterology

## 2014-02-10 DIAGNOSIS — K279 Peptic ulcer, site unspecified, unspecified as acute or chronic, without hemorrhage or perforation: Secondary | ICD-10-CM

## 2014-02-10 DIAGNOSIS — F17200 Nicotine dependence, unspecified, uncomplicated: Secondary | ICD-10-CM

## 2014-02-10 LAB — CBC
HEMATOCRIT: 39.5 % (ref 39.0–52.0)
Hemoglobin: 13.9 g/dL (ref 13.0–17.0)
MCH: 31.2 pg (ref 26.0–34.0)
MCHC: 35.2 g/dL (ref 30.0–36.0)
MCV: 88.8 fL (ref 78.0–100.0)
PLATELETS: 374 10*3/uL (ref 150–400)
RBC: 4.45 MIL/uL (ref 4.22–5.81)
RDW: 13.4 % (ref 11.5–15.5)
WBC: 14.6 10*3/uL — AB (ref 4.0–10.5)

## 2014-02-10 LAB — COMPREHENSIVE METABOLIC PANEL
ALK PHOS: 126 U/L — AB (ref 39–117)
ALT: 17 U/L (ref 0–53)
AST: 12 U/L (ref 0–37)
Albumin: 3.1 g/dL — ABNORMAL LOW (ref 3.5–5.2)
Anion gap: 12 (ref 5–15)
BILIRUBIN TOTAL: 0.4 mg/dL (ref 0.3–1.2)
BUN: 15 mg/dL (ref 6–23)
CHLORIDE: 104 meq/L (ref 96–112)
CO2: 26 meq/L (ref 19–32)
CREATININE: 0.83 mg/dL (ref 0.50–1.35)
Calcium: 9.3 mg/dL (ref 8.4–10.5)
GFR calc Af Amer: >90 mL/min (ref >90)
Glucose, Bld: 121 mg/dL — ABNORMAL HIGH (ref 70–99)
POTASSIUM: 4 meq/L (ref 3.7–5.3)
Sodium: 142 mEq/L (ref 137–147)
Total Protein: 6.5 g/dL (ref 6.0–8.3)

## 2014-02-10 MED ORDER — BOOST / RESOURCE BREEZE PO LIQD: 1.0000 | Freq: Three times a day (TID) | ORAL | Status: DC

## 2014-02-10 MED ORDER — PRO-STAT SUGAR FREE PO LIQD
30.0000 mL | Freq: Three times a day (TID) | ORAL | Status: DC
  Administered 2014-02-10: 30 mL via ORAL
  Filled 2014-02-10 (×2): qty 30

## 2014-02-10 MED ORDER — LORAZEPAM 1 MG PO TABS
1.0000 mg | ORAL_TABLET | Freq: Once | ORAL | Status: AC
  Administered 2014-02-10: 1 mg via ORAL
  Filled 2014-02-10: qty 1

## 2014-02-10 NOTE — Consult Note (Signed)
Referring Provider: Dr. Darrick Meigs Primary Care Physician:  Sallee Lange, MD Primary Gastroenterologist:  Dr. Oneida Alar   Date of Admission: 02/09/14 Date of Consultation: 02/10/14  Reason for Consultation:  Abnormal CT scan  HPI:  Nicolas [name] is a 38 year old male presenting with persistent abdominal pain, N/V. He has a history significant for STEMI in early August; he was found by his wife with agonal breathing. CPR initiated, with need for cardiac cath that showed normal EF and normal coronaries. It was thought that perhaps he had taken an unknown pill at the party earlier that evening. Despite negative UDS and normal ETOH level, question of a potential drug reaction not picked up by UDS.  He then was admitted 8/21 due to ETOH pancreatitis, thought to be secondary to ETOH use. Lipase 237 at this time, without CT evidence of pancreatitis. MRCP recommended for further evaluation.  Returned again on 8/31 with persistent diffuse abdominal pain, associated N/V. States drinking water caused N/V. Denies melena, hematemesis. Has been taking Ibuprofen 600 mg routinely since early August. States he only drinks occasionally, usually socially. Denies drug use to me. Reports weight loss of approximately 40 lbs.   CT reveals progressive wall thickening in the gastric antrum with focal ulceration. Inflammatory/edematous changes noted inferior to the pylorus with regional adenopathy. No evidence of perforation or abscess.    Past Medical History  Diagnosis Date  . Hypertension   . Kidney stone   . Pancreatitis   . STEMI (ST elevation myocardial infarction) 01/19/14    normal cardiac cath    Past Surgical History  Procedure Laterality Date  . Cardiac catheterization  Aug 2015    normal EF, normal coronaries    Prior to Admission medications   Medication Sig Start Date End Date Taking? Authorizing Provider  amLODipine (NORVASC) 10 MG tablet Take 1 tablet (10 mg total) by mouth daily. 02/01/14  Yes Rexene Alberts, MD  HYDROcodone-acetaminophen (NORCO) 7.5-325 MG per tablet Take 2 tablets by mouth every 4 (four) hours as needed for severe pain (pain). 02/08/14  Yes Nishant Dhungel, MD  nystatin cream (MYCOSTATIN) Apply 1 application topically 3 (three) times daily. Apply tid groin area prn 01/23/14  Yes Scott A Luking, MD  pantoprazole (PROTONIX) 40 MG tablet Take 1 tablet (40 mg total) by mouth daily. 02/01/14  Yes Rexene Alberts, MD  promethazine (PHENERGAN) 25 MG tablet Take 1 tablet (25 mg total) by mouth every 8 (eight) hours as needed for nausea or vomiting. 01/23/14  Yes Kathyrn Drown, MD    Current Facility-Administered Medications  Medication Dose Route Frequency Provider Last Rate Last Dose  . dextrose 5 %-0.45 % sodium chloride infusion   Intravenous STAT Dorie Rank, MD 100 mL/hr at 02/10/14 0824    . enoxaparin (LOVENOX) injection 40 mg  40 mg Subcutaneous Q24H Oswald Hillock, MD   40 mg at 02/10/14 0807  . hydrALAZINE (APRESOLINE) injection 10 mg  10 mg Intravenous Q6H PRN Oswald Hillock, MD      . HYDROmorphone (DILAUDID) injection 1 mg  1 mg Intravenous Q2H PRN Oswald Hillock, MD   1 mg at 02/10/14 0817  . ondansetron (ZOFRAN) tablet 4 mg  4 mg Oral Q6H PRN Oswald Hillock, MD       Or  . ondansetron (ZOFRAN) injection 4 mg  4 mg Intravenous Q6H PRN Oswald Hillock, MD      . pantoprazole (PROTONIX) 80 mg in sodium chloride 0.9 % 250 mL (0.32 mg/mL) infusion  8 mg/hr Intravenous Continuous Oswald Hillock, MD 25 mL/hr at 02/10/14 0824 8 mg/hr at 02/10/14 0824  . [START ON 02/13/2014] pantoprazole (PROTONIX) injection 40 mg  40 mg Intravenous Q12H Oswald Hillock, MD        Allergies as of 02/09/2014  . (No Known Allergies)    Family History  Problem Relation Age of Onset  . Cancer Mother     Breast  . Colon cancer Neg Hx     History   Social History  . Marital Status: Married    Spouse Name: N/A    Number of Children: N/A  . Years of Education: N/A   Occupational History  . Not on file.    Social History Main Topics  . Smoking status: Current Every Day Smoker -- 1.50 packs/day for 10 years    Types: Cigarettes  . Smokeless tobacco: Never Used  . Alcohol Use: No  . Drug Use: No  . Sexual Activity: Yes    Birth Control/ Protection: None   Other Topics Concern  . Not on file   Social History Narrative  . No narrative on file    Review of Systems: Gen: see HPI CV: Denies chest pain, heart palpitations, syncope, edema  Resp: Denies shortness of breath with rest, cough, wheezing GI: see HPI GU : Denies urinary burning, urinary frequency, urinary incontinence.  MS: Denies joint pain,swelling, cramping Derm: Denies rash, itching, dry skin Psych: Denies depression, anxiety,confusion, or memory loss Heme: Denies bruising, bleeding, and enlarged lymph nodes.  Physical Exam: Vital signs in last 24 hours: Temp:  [98.4 F (36.9 C)-98.9 F (37.2 C)] 98.4 F (36.9 C) (09/01 0602) Pulse Rate:  [90-134] 90 (09/01 0602) Resp:  [20] 20 (08/31 1810) BP: (132-176)/(90-111) 132/90 mmHg (09/01 0602) SpO2:  [95 %-100 %] 97 % (09/01 0602) Weight:  [238 lb (107.956 kg)] 238 lb (107.956 kg) (08/31 2317) Last BM Date: 03/08/14 General:   Alert,  Well-developed, well-nourished, pleasant and cooperative in NAD Head:  Normocephalic and atraumatic. Eyes:  Sclera clear, no icterus.   Conjunctiva pink. Ears:  Normal auditory acuity. Nose:  No deformity, discharge,  or lesions. Mouth:  No deformity or lesions, dentition normal. Lungs:  Clear throughout to auscultation.   No wheezes, crackles, or rhonchi. No acute distress. Heart:  S1 S2 present, no murmurs appreciated.  Abdomen:  Soft, TTP periumbilically and nondistended. No masses, hepatosplenomegaly or hernias noted. Normal bowel sounds, without guarding, and without rebound.   Rectal:  Deferred   Msk:  Symmetrical without gross deformities. Normal posture. Extremities:  Without edema. Neurologic:  Alert and  oriented x4;   grossly normal neurologically. Skin:  Intact without significant lesions or rashes. Cervical Nodes:  No significant cervical adenopathy. Psych:  Alert and cooperative. Normal mood and affect.  Intake/Output from previous day:   Intake/Output this shift: Total I/O In: 1075.8 [I.V.:1075.8] Out: -   Lab Results:  Recent Labs  02/09/14 1822 02/10/14 0525  WBC 18.7* 14.6*  HGB 17.0 13.9  HCT 46.4 39.5  PLT 459* 374   BMET  Recent Labs  02/09/14 1822 02/10/14 0525  NA 141 142  K 3.5* 4.0  CL 96 104  CO2 27 26  GLUCOSE 124* 121*  BUN 15 15  CREATININE 0.86 0.83  CALCIUM 10.8* 9.3   LFT  Recent Labs  02/09/14 1822 02/10/14 0525  PROT 8.4* 6.5  ALBUMIN 4.3 3.1*  AST 13 12  ALT 23 17  ALKPHOS 165* 126*  BILITOT  0.5 0.4   Lab Results  Component Value Date   LIPASE 52 02/09/2014     Studies/Results: Ct Abdomen Pelvis W Contrast  02/09/2014   CLINICAL DATA:  ABDOMINAL PAIN EMESIS  EXAM: CT ABDOMEN AND PELVIS WITH CONTRAST  TECHNIQUE: Multidetector CT imaging of the abdomen and pelvis was performed using the standard protocol following bolus administration of intravenous contrast.  CONTRAST:  144m OMNIPAQUE IOHEXOL 300 MG/ML  SOLN  COMPARISON:  01/30/2014  FINDINGS: Visualized lung bases clear.  Some increase in inflammatory/edematous changes between the gallbladder and pancreatic head, inferior to the pylorus. Wall thickening in the gastric antrum will is suspected. There is a focal outpouching from the gastric lumen along the greater curvature suggesting ulceration. Regional prominent lymph nodes measuring up to 13 mm short axis diameter image 31/2. No focal fluid collection or free air.  Unremarkable liver, spleen, kidneys. Chronic will will Linear coarse calcifications in both adrenal glands as before. There is homogeneous pancreatic parenchymal enhancement without ductal dilatation. Unremarkable aorta and portal vein. Stomach is physiologically distended. Small bowel  and colon are nondilated. Normal appendix. Urinary bladder nondistended. Bilateral pelvic phleboliths. No ascites. Subcentimeter right lower quadrant mesenteric, left para-aortic, aortocaval, and central mesenteric lymph nodes. Lumbar spine unremarkable.  IMPRESSION: 1. Progressive wall thickening in the gastric antrum with focal ulceration suggesting peptic ulcer disease versus ulcerated gastric carcinoma. Recommend endoscopy for further evaluation. 2. Inflammatory/edematous changes inferior to the pylorus with regional adenopathy, likely secondary to the gastric process. No evidence of perforation or abscess.   Electronically Signed   By: DArne ClevelandM.D.   On: 02/09/2014 20:33    Impression: 38year old male with prior history as outlined above, with recent bout with pancreatitis earlier this month, now admitted with persistent abdominal pain, N/V and CT revealing progressive wall thickening in the gastric antrum with focal ulceration. Differentials including PUD but less likely ulcerated gastric carcinoma. Endorses significant NSAID use since early August 2015. Question PUD secondary to NSAID +/- ETOH use. Patient desires to be completely sedated for this procedure, utilizing Propofol. As of note, recent STEMI in early August with negative cardiac cath, question drug reaction from substance at a party.   Isolated elevated alk phos: Monitor. May need further work-up as outpatient. Non-specific in this setting.  Plan: Anticipate EGD with Dr. RGala Romneywith Propofol on 9/2. Risks and benefits discussed in detail Would recommend PPI BID; Protonix drip started on admission. Will continue for now until after EGD.  Clears today Hold Lovenox dose on 9/2 NPO after midnight Outpatient MRCP for further evaluation of prior episode of pancreatitis   AOrvil Feil ANP-BC RSan [facility] LPGastroenterology      LOS: 1 day    02/10/2014, 9:15 AM

## 2014-02-10 NOTE — Care Management Note (Signed)
    Page 1 of 1   02/10/2014     2:28:55 PM CARE MANAGEMENT NOTE 02/10/2014  Patient:  Nicolas [name],Nicolas [name]   Account Number:  [id number]  Date Initiated:  02/10/2014  Documentation initiated by:  Sharrie Rothman  Subjective/Objective Assessment:   Pt admitted from home with abd pain. Pt lives with his wife and will return home at discharge. Pt is independent with ADL's. Pt stated that his insurance has been reinstated effective today.     Action/Plan:   Artist has spoken with pt about insurance and self pay status. Anticipate no discharge needs.   Anticipated DC Date:  02/12/2014   Anticipated DC Plan:  HOME/SELF CARE  In-house referral  Financial Counselor      DC Planning Services  CM consult      Choice offered to / List presented to:             Status of service:  Completed, signed off Medicare Important Message given?   (If response is "NO", the following Medicare IM given date fields will be blank) Date Medicare IM given:   Medicare IM given by:   Date Additional Medicare IM given:   Additional Medicare IM given by:    Discharge Disposition:    Per UR Regulation:    If discussed at Long Length of Stay Meetings, dates discussed:    Comments:  02/10/14 1425 Arlyss Queen, RN BSN CM

## 2014-02-10 NOTE — Progress Notes (Signed)
Patient anxious about procedure tomorrow. Dr. Gonzella Lex notified. New order for one time dose of Ativan 1mg  at bedtime.

## 2014-02-10 NOTE — Progress Notes (Signed)
INITIAL NUTRITION ASSESSMENT  DOCUMENTATION CODES Per approved criteria  -Severe malnutrition in the context of acute illness or injury   Pt meets criteria for severe MALNUTRITION in the context of acute illness as evidenced by energy intake </= 75% for >/= 1 month, >5% weight loss  In 1 month .   INTERVENTION: Resource Breeze po TID, each supplement provides 250 kcal and 9 grams of protein   ProStat 30 ml TID (each 30 ml provides 100 kcal, 15 gr protein)   Provided nutrition therapy diet education for pancreatitis   NUTRITION DIAGNOSIS: Inadequate oral intake related to altered GI function, anorexia as evidenced by poor diet tolerance, vomiting, 12% weight loss < 30 days.       Goal: Pt to meet >/= 90% of their estimated nutrition needs    Monitor:  Po intake, labs and wt trends   Reason for Assessment: Malnutrition Screen Score =  5  38 y.o. male  ASSESSMENT: Pt hx includes  hypertension, kidney stone, nausea and vomiting and recurrent pancreatitis,ETOH (occasional) . CT suggests possible peptic ulcer disease. He c/o that his po intake has been limited due to poor diet tolerance and pt says vomiting even with water. Multiple episodes of vomiting. Hospitalized 4 times in the past 6 months. On 8/21 admitted with c/o abdominal pain and reported anorexia for 2 weeks. Pt has severe weight loss of 12% in < 30 days.   Labs: glucose 121 mg/dl  Nutrition Focused Physical Exam:  Subcutaneous Fat:  Orbital Region: WDL Upper Arm Region: mild depletion Thoracic and Lumbar Region: WDL  Muscle:  Temple Region: mild wasting Clavicle Bone Region: WDL Clavicle and Acromion Bone Region: WDL Scapular Bone Region: WDL Dorsal Hand: WDL Patellar Region: WDL Anterior Thigh Region: WDL Posterior Calf Region: WDL  Edema: none noted    Height: Ht Readings from Last 1 Encounters:  02/09/14  (1.905 m)    Weight: Wt Readings from Last 1 Encounters:  02/09/14 238 lb (107.956 kg)     Ideal Body Weight:  196# (89 kg)  % Ideal Body Weight: 121%  Wt Readings from Last 10 Encounters:  02/09/14 238 lb (107.956 kg)  02/07/14 237 lb 15.8 oz (107.95 kg)  01/30/14 247 lb 2.2 oz (112.1 kg)  01/23/14 257 lb (116.574 kg)  01/18/14 269 lb 6.4 oz (122.2 kg)  01/18/14 269 lb 6.4 oz (122.2 kg)  12/21/13 265 lb (120.203 kg)  10/30/12 265 lb (120.203 kg)  10/24/12 274 lb 6.4 oz (124.467 kg)  03/21/12 270 lb (122.471 kg)    Usual Body Weight: 265-270#  % Usual Body Weight: 88%  BMI:  Body mass index is 29.75 kg/(m^2). overweight  Estimated Nutritional Needs: Kcal: 2089-2506 Protein: 133-151 gr Fluid: >2.1 liters daily  Skin: intact  Diet Order: NPO  EDUCATION NEEDS: Nutrition therapy diet education for pancreatitis  No intake or output data in the 24 hours ending 02/10/14 0756  Last BM: PTA  Labs:   Recent Labs Lab 02/07/14 0612 02/09/14 1822 02/10/14 0525  NA 139 141 142  K 4.3 3.5* 4.0  CL 102 96 104  CO2 BUN CREATININE 0.87 0.86 0.83  CALCIUM 9.3 10.8* 9.3  GLUCOSE 82 124* 121*    CBG (last 3)  No results found for this basename: GLUCAP,  in the last 72 hours  Scheduled Meds: . dextrose 5 % and 0.45% NaCl   Intravenous STAT  . enoxaparin (LOVENOX) injection  40 mg  Subcutaneous Q24H  . [START ON 02/13/2014] pantoprazole (PROTONIX) IV  40 mg Intravenous Q12H    Continuous Infusions: . pantoprozole (PROTONIX) infusion 8 mg/hr (02/09/14 2358)    Past Medical History  Diagnosis Date  . Hypertension   . Kidney stone   . Pancreatitis     Past Surgical History  Procedure Laterality Date  . Cardiac catheterization      Royann Shivers MS,RD,CSG,LDN Office: #[phone] Pager: [phone]

## 2014-02-10 NOTE — Progress Notes (Signed)
UR chart review completed.  

## 2014-02-10 NOTE — Consult Note (Signed)
REVIEWED. AGREE. NO ADDITIONAL RECOMMENDATIONS. 

## 2014-02-10 NOTE — Progress Notes (Signed)
[facility] PROGRESS NOTE  Nicolas [name] DPO:[id number] DOB: 1975-07-03 DOA: 02/09/2014 PCP: Lilyan Punt, MD Brief narrative 38 year old obese male with history of hypertension recent hospitalization for pancreatitis who was readmitted last week again for nausea vomiting and thought to be recurrent pancreatitis and discharged home 2 days back presented to the ED with nausea and vomiting and abdominal pain.Marland Kitchen He had almost 10 episodes of vomiting on the day of admission. A CT scan of the abdomen and pelvis was repeated in the ED which showed a progressive wall thickening of the gastric antrum with focal ulceration suggestive of peptic ulcer disease versus ulcerated gastric carcinoma and inflammatory/edematus  changes inferior to the pylorus.  Patient admitted to hospital service and started on PPI drip. GI consulted.  Assessment/Plan: Abdominal pain concerning for peptic ulcer disease -Continue Protonix drip and transitioned to IV Protonix.  -Seen by GI and plan on EGD on 9/2. N.p.o. after midnight. -Monitor H&H. Counseled strongly on smoking cessation. -Supportive care with IV fluids and antiemetics.  Leukocytosis Likely reactive. Monitoring in am  Pancreatitis Lipase is normal. Plan on MRCP as outpatient for his recurrent pancreatitis and elevated alkaline phosphatase  Tobacco abuse Consult strongly on suggestion again. Continue nicotine patch  Hypertension On when necessary hydralazine    DIET:  Clear liquids, n.p.o. after midnight DVT prophylaxis: Subcutaneous Lovenox  Code Status: Full code Family Communication: None bedside Disposition Plan: Home depending upon EGD result   Consultants:  GI (Dr. Nadeen Landau)  Procedures:  EGD on 9/2  Antibiotics:  None  HPI/Subjective: Patient seen and examined this morning. Admission H&P reviewed. Reports his epigastric pain to be better today. No nausea or vomiting  Objective: Filed Vitals:   02/10/14 0602  BP:  132/90  Pulse: 90  Temp: 98.4 F (36.9 C)  Resp:     Intake/Output Summary (Last 24 hours) at 02/10/14 1050 Last data filed at 02/10/14 0824  Gross per 24 hour  Intake 1075.83 ml  Output      0 ml  Net 1075.83 ml   Filed Weights   02/09/14 1736 02/09/14 2317  Weight: 107.956 kg (238 lb) 107.956 kg (238 lb)    Exam:  General: Middle aged obese male in no acute distress  HEENT: No pallor, moist oral mucosa  Chest: Clear to auscultation bilaterally  CVS: Normal S1 and S2  Abdomen: Soft, nondistended, mild epigastric tenderness, bowel sounds present  Extremities: Warm, no edema   Data Reviewed: Basic Metabolic Panel:  Recent Labs Lab 02/06/14 1446 02/07/14 0612 02/09/14 1822 02/10/14 0525  NA 139 139 141 142  K 4.7 4.3 3.5* 4.0  CL 100 102 96 104  CO2 28 27 27 26   GLUCOSE 105* 82 124* 121*  BUN 14 14 15 15   CREATININE 0.91 0.87 0.86 0.83  CALCIUM 10.5 9.3 10.8* 9.3   Liver Function Tests:  Recent Labs Lab 02/06/14 1446 02/09/14 1822 02/10/14 0525  AST 16 13 12   ALT 26 23 17   ALKPHOS 156* 165* 126*  BILITOT 0.5 0.5 0.4  PROT 7.7 8.4* 6.5  ALBUMIN 3.9 4.3 3.1*    Recent Labs Lab 02/06/14 1446 02/07/14 0612 02/09/14 1822  LIPASE 170* 60* 52  AMYLASE 102  --   --    No results found for this basename: AMMONIA,  in the last 168 hours CBC:  Recent Labs Lab 02/06/14 1446 02/07/14 0612 02/09/14 1822 02/10/14 0525  WBC 11.2* 11.8* 18.7* 14.6*  NEUTROABS 7.5  --  15.4*  --   HGB  16.0 13.6 17.0 13.9  HCT 43.9 38.9* 46.4 39.5  MCV 87.8 89.4 86.9 88.8  PLT 402* 346 459* 374   Cardiac Enzymes: No results found for this basename: CKTOTAL, CKMB, CKMBINDEX, TROPONINI,  in the last 168 hours BNP (last 3 results) No results found for this basename: PROBNP,  in the last 8760 hours CBG: No results found for this basename: GLUCAP,  in the last 168 hours  No results found for this or any previous visit (from the past 240 hour(s)).   Studies: Ct  Abdomen Pelvis W Contrast  02/09/2014   CLINICAL DATA:  ABDOMINAL PAIN EMESIS  EXAM: CT ABDOMEN AND PELVIS WITH CONTRAST  TECHNIQUE: Multidetector CT imaging of the abdomen and pelvis was performed using the standard protocol following bolus administration of intravenous contrast.  CONTRAST:  OMNIPAQUE IOHEXOL 300 MG/ML  SOLN  COMPARISON:  01/30/2014  FINDINGS: Visualized lung bases clear.  Some increase in inflammatory/edematous changes between the gallbladder and pancreatic head, inferior to the pylorus. Wall thickening in the gastric antrum will is suspected. There is a focal outpouching from the gastric lumen along the greater curvature suggesting ulceration. Regional prominent lymph nodes measuring up to 13 mm short axis diameter image 31/2. No focal fluid collection or free air.  Unremarkable liver, spleen, kidneys. Chronic will will Linear coarse calcifications in both adrenal glands as before. There is homogeneous pancreatic parenchymal enhancement without ductal dilatation. Unremarkable aorta and portal vein. Stomach is physiologically distended. Small bowel and colon are nondilated. Normal appendix. Urinary bladder nondistended. Bilateral pelvic phleboliths. No ascites. Subcentimeter right lower quadrant mesenteric, left para-aortic, aortocaval, and central mesenteric lymph nodes. Lumbar spine unremarkable.  IMPRESSION: 1. Progressive wall thickening in the gastric antrum with focal ulceration suggesting peptic ulcer disease versus ulcerated gastric carcinoma. Recommend endoscopy for further evaluation. 2. Inflammatory/edematous changes inferior to the pylorus with regional adenopathy, likely secondary to the gastric process. No evidence of perforation or abscess.   Electronically Signed   By: Oley Balm M.D.   On: 02/09/2014 20:33    Scheduled Meds: . dextrose 5 % and 0.45% NaCl   Intravenous STAT  . [START ON 02/13/2014] pantoprazole (PROTONIX) IV  40 mg Intravenous Q12H   Continuous  Infusions: . pantoprozole (PROTONIX) infusion 8 mg/hr (02/10/14 1009)     Time spent: 25 minutes    Pegah Segel  [facility] Pager [phone]. If 7PM-7AM, please contact night-coverage at [email or web address], password [facility] 02/10/2014, 10:50 AM  LOS: 1 day

## 2014-02-11 ENCOUNTER — Encounter (HOSPITAL_COMMUNITY): Payer: Self-pay | Admitting: Anesthesiology

## 2014-02-11 ENCOUNTER — Inpatient Hospital Stay (HOSPITAL_COMMUNITY): Payer: 59 | Admitting: Anesthesiology

## 2014-02-11 ENCOUNTER — Encounter (HOSPITAL_COMMUNITY)
Admission: EM | Disposition: A | Discharge status: Home / Self Care | Payer: Self-pay | Source: Home / Self Care | Attending: Family Medicine

## 2014-02-11 ENCOUNTER — Encounter (HOSPITAL_COMMUNITY): Payer: Self-pay | Admitting: *Deleted

## 2014-02-11 DIAGNOSIS — K2100 Gastro-esophageal reflux disease with esophagitis, without bleeding: Secondary | ICD-10-CM

## 2014-02-11 DIAGNOSIS — E43 Unspecified severe protein-calorie malnutrition: Secondary | ICD-10-CM | POA: Insufficient documentation

## 2014-02-11 HISTORY — PX: ESOPHAGOGASTRODUODENOSCOPY (EGD) WITH PROPOFOL: SHX5813

## 2014-02-11 HISTORY — PX: BIOPSY: SHX5522

## 2014-02-11 LAB — CBC
HEMATOCRIT: 40.8 % (ref 39.0–52.0)
Hemoglobin: 14.4 g/dL (ref 13.0–17.0)
MCH: 31.2 pg (ref 26.0–34.0)
MCHC: 35.3 g/dL (ref 30.0–36.0)
MCV: 88.5 fL (ref 78.0–100.0)
Platelets: 382 10*3/uL (ref 150–400)
RBC: 4.61 MIL/uL (ref 4.22–5.81)
RDW: 13.4 % (ref 11.5–15.5)
WBC: 10.4 10*3/uL (ref 4.0–10.5)

## 2014-02-11 SURGERY — ESOPHAGOGASTRODUODENOSCOPY (EGD) WITH PROPOFOL
Anesthesia: Monitor Anesthesia Care

## 2014-02-11 MED ORDER — PROPOFOL INFUSION 10 MG/ML OPTIME
INTRAVENOUS | Status: DC | PRN
  Administered 2014-02-11: 85 ug/kg/min via INTRAVENOUS
  Administered 2014-02-11: 15:00:00 via INTRAVENOUS

## 2014-02-11 MED ORDER — FENTANYL CITRATE 0.05 MG/ML IJ SOLN
25.0000 ug | INTRAMUSCULAR | Status: AC
  Administered 2014-02-11 (×2): 25 ug via INTRAVENOUS
  Filled 2014-02-11: qty 2

## 2014-02-11 MED ORDER — NICOTINE 21 MG/24HR TD PT24
21.0000 mg | MEDICATED_PATCH | Freq: Every day | TRANSDERMAL | Status: DC
  Administered 2014-02-11: 21 mg via TRANSDERMAL
  Filled 2014-02-11: qty 1

## 2014-02-11 MED ORDER — SODIUM CHLORIDE 0.9 % IV SOLN: INTRAVENOUS | Status: DC

## 2014-02-11 MED ORDER — SUCRALFATE 1 GM/10ML PO SUSP: 1.0000 g | Freq: Three times a day (TID) | ORAL | Status: DC

## 2014-02-11 MED ORDER — ONDANSETRON HCL 4 MG/2ML IJ SOLN
4.0000 mg | Freq: Once | INTRAMUSCULAR | Status: AC
  Administered 2014-02-11: 4 mg via INTRAVENOUS

## 2014-02-11 MED ORDER — MIDAZOLAM HCL 2 MG/2ML IJ SOLN
INTRAMUSCULAR | Status: AC
  Filled 2014-02-11: qty 2

## 2014-02-11 MED ORDER — 0.9 % SODIUM CHLORIDE (POUR BTL) OPTIME
TOPICAL | Status: DC | PRN
  Administered 2014-02-11: 1000 mL

## 2014-02-11 MED ORDER — GLYCOPYRROLATE 0.2 MG/ML IJ SOLN
INTRAMUSCULAR | Status: AC
  Filled 2014-02-11: qty 1

## 2014-02-11 MED ORDER — LIDOCAINE VISCOUS 2 % MT SOLN
OROMUCOSAL | Status: DC | PRN
  Administered 2014-02-11: 1 via OROMUCOSAL

## 2014-02-11 MED ORDER — PANTOPRAZOLE SODIUM 40 MG PO TBEC
40.0000 mg | DELAYED_RELEASE_TABLET | Freq: Two times a day (BID) | ORAL | Status: DC
  Administered 2014-02-11 – 2014-02-12 (×2): 40 mg via ORAL
  Filled 2014-02-11 (×2): qty 1

## 2014-02-11 MED ORDER — GLYCOPYRROLATE 0.2 MG/ML IJ SOLN
0.2000 mg | Freq: Once | INTRAMUSCULAR | Status: AC
  Administered 2014-02-11: 0.2 mg via INTRAVENOUS

## 2014-02-11 MED ORDER — LIDOCAINE VISCOUS 2 % MT SOLN
OROMUCOSAL | Status: AC
  Administered 2014-02-11: 3 mL
  Filled 2014-02-11: qty 15

## 2014-02-11 MED ORDER — LORAZEPAM 1 MG PO TABS
1.0000 mg | ORAL_TABLET | Freq: Once | ORAL | Status: AC
  Administered 2014-02-11: 1 mg via ORAL
  Filled 2014-02-11: qty 1

## 2014-02-11 MED ORDER — MIDAZOLAM HCL 5 MG/5ML IJ SOLN
INTRAMUSCULAR | Status: DC | PRN
  Administered 2014-02-11 (×2): 2 mg via INTRAVENOUS

## 2014-02-11 MED ORDER — ONDANSETRON HCL 4 MG/2ML IJ SOLN: 4.0000 mg | Freq: Once | INTRAMUSCULAR | Status: DC | PRN

## 2014-02-11 MED ORDER — SUCRALFATE 1 G PO TABS
1.0000 g | ORAL_TABLET | Freq: Three times a day (TID) | ORAL | Status: DC
  Administered 2014-02-11 – 2014-02-12 (×2): 1 g via ORAL
  Filled 2014-02-11 (×2): qty 1

## 2014-02-11 MED ORDER — AMLODIPINE BESYLATE 5 MG PO TABS
10.0000 mg | ORAL_TABLET | Freq: Every day | ORAL | Status: DC
  Administered 2014-02-11 – 2014-02-12 (×2): 10 mg via ORAL
  Filled 2014-02-11 (×2): qty 2

## 2014-02-11 MED ORDER — FENTANYL CITRATE 0.05 MG/ML IJ SOLN
25.0000 ug | INTRAMUSCULAR | Status: DC | PRN
Start: 2014-02-11 — End: 2014-02-11

## 2014-02-11 MED ORDER — PROPOFOL 10 MG/ML IV EMUL
INTRAVENOUS | Status: AC
  Filled 2014-02-11: qty 20

## 2014-02-11 MED ORDER — MIDAZOLAM HCL 2 MG/2ML IJ SOLN
1.0000 mg | INTRAMUSCULAR | Status: AC | PRN
  Administered 2014-02-11 (×3): 2 mg via INTRAVENOUS
  Filled 2014-02-11 (×2): qty 2

## 2014-02-11 MED ORDER — PROMETHAZINE HCL 12.5 MG PO TABS: 25.0000 mg | ORAL_TABLET | Freq: Three times a day (TID) | ORAL | Status: DC | PRN

## 2014-02-11 MED ORDER — NICOTINE POLACRILEX 2 MG MT GUM
2.0000 mg | CHEWING_GUM | OROMUCOSAL | Status: DC | PRN
  Filled 2014-02-11: qty 1

## 2014-02-11 MED ORDER — LACTATED RINGERS IV SOLN
INTRAVENOUS | Status: DC
  Administered 2014-02-11: 14:00:00 via INTRAVENOUS

## 2014-02-11 MED ORDER — ONDANSETRON HCL 4 MG/2ML IJ SOLN
INTRAMUSCULAR | Status: AC
  Filled 2014-02-11: qty 2

## 2014-02-11 SURGICAL SUPPLY — 21 items
BLOCK BITE 60FR ADLT L/F BLUE (MISCELLANEOUS) ×3 IMPLANT
DEVICE CLIP HEMOSTAT 235CM (CLIP) IMPLANT
ELECT REM PT RETURN 9FT ADLT (ELECTROSURGICAL)
ELECTRODE REM PT RTRN 9FT ADLT (ELECTROSURGICAL) IMPLANT
FLOOR PAD 36X40 (MISCELLANEOUS) ×3
FORCEPS BIOP RAD 4 LRG CAP 4 (CUTTING FORCEPS) ×3 IMPLANT
FORCEPS BIOP RJ4 1.8 (CUTTING FORCEPS) ×3 IMPLANT
FORMALIN 10 PREFIL 20ML (MISCELLANEOUS) IMPLANT
KIT CLEAN ENDO COMPLIANCE (KITS) ×3 IMPLANT
MANIFOLD NEPTUNE II (INSTRUMENTS) ×3 IMPLANT
NEEDLE SCLEROTHERAPY 25GX240 (NEEDLE) IMPLANT
PAD FLOOR 36X40 (MISCELLANEOUS) ×1 IMPLANT
PROBE APC STR FIRE (PROBE) IMPLANT
PROBE INJECTION GOLD (MISCELLANEOUS)
PROBE INJECTION GOLD 7FR (MISCELLANEOUS) IMPLANT
SNARE ROTATE MED OVAL 20MM (MISCELLANEOUS) IMPLANT
SNARE SHORT THROW 13M SML OVAL (MISCELLANEOUS) ×3 IMPLANT
SYR 50ML LL SCALE MARK (SYRINGE) ×3 IMPLANT
SYR INFLATION 60ML (SYRINGE) ×3 IMPLANT
TUBING IRRIGATION ENDOGATOR (MISCELLANEOUS) ×3 IMPLANT
WATER STERILE IRR 1000ML POUR (IV SOLUTION) ×3 IMPLANT

## 2014-02-11 NOTE — Anesthesia Postprocedure Evaluation (Signed)
  Anesthesia Post-op Note  Patient: Nicolas [name]  Procedure(s) Performed: Procedure(s): ESOPHAGOGASTRODUODENOSCOPY (EGD) WITH PROPOFOL (N/A) BIOPSY (N/A)  Patient Location: PACU  Anesthesia Type:MAC  Level of Consciousness: awake, alert , oriented and patient cooperative  Airway and Oxygen Therapy: Patient Spontanous Breathing  Post-op Pain: none  Post-op Assessment: Post-op Vital signs reviewed, Patient's Cardiovascular Status Stable, Respiratory Function Stable, Patent Airway and Pain level controlled  Post-op Vital Signs: Reviewed and stable  Last Vitals:  Filed Vitals:   02/11/14 1415  BP: 128/83  Pulse:   Temp:   Resp: 21    Complications: No apparent anesthesia complications

## 2014-02-11 NOTE — Progress Notes (Signed)
Patient is anxious and agitated because he expected to be discharged. Paged the on-call physician will follow orders given.

## 2014-02-11 NOTE — H&P (View-Only) (Signed)
Referring Provider: Dr. Darrick Meigs Primary Care Physician:  Sallee Lange, MD Primary Gastroenterologist:  Dr. Oneida Alar   Date of Admission: 02/09/14 Date of Consultation: 02/10/14  Reason for Consultation:  Abnormal CT scan  HPI:  Nicolas [name] is a 38 year old male presenting with persistent abdominal pain, N/V. He has a history significant for STEMI in early August; he was found by his wife with agonal breathing. CPR initiated, with need for cardiac cath that showed normal EF and normal coronaries. It was thought that perhaps he had taken an unknown pill at the party earlier that evening. Despite negative UDS and normal ETOH level, question of a potential drug reaction not picked up by UDS.  He then was admitted 8/21 due to ETOH pancreatitis, thought to be secondary to ETOH use. Lipase 237 at this time, without CT evidence of pancreatitis. MRCP recommended for further evaluation.  Returned again on 8/31 with persistent diffuse abdominal pain, associated N/V. States drinking water caused N/V. Denies melena, hematemesis. Has been taking Ibuprofen 600 mg routinely since early August. States he only drinks occasionally, usually socially. Denies drug use to me. Reports weight loss of approximately 40 lbs.   CT reveals progressive wall thickening in the gastric antrum with focal ulceration. Inflammatory/edematous changes noted inferior to the pylorus with regional adenopathy. No evidence of perforation or abscess.    Past Medical History  Diagnosis Date  . Hypertension   . Kidney stone   . Pancreatitis   . STEMI (ST elevation myocardial infarction) 01/19/14    normal cardiac cath    Past Surgical History  Procedure Laterality Date  . Cardiac catheterization  Aug 2015    normal EF, normal coronaries    Prior to Admission medications   Medication Sig Start Date End Date Taking? Authorizing Provider  amLODipine (NORVASC) 10 MG tablet Take 1 tablet (10 mg total) by mouth daily. 02/01/14  Yes Rexene Alberts, MD  HYDROcodone-acetaminophen (NORCO) 7.5-325 MG per tablet Take 2 tablets by mouth every 4 (four) hours as needed for severe pain (pain). 02/08/14  Yes Nishant Dhungel, MD  nystatin cream (MYCOSTATIN) Apply 1 application topically 3 (three) times daily. Apply tid groin area prn 01/23/14  Yes Scott A Luking, MD  pantoprazole (PROTONIX) 40 MG tablet Take 1 tablet (40 mg total) by mouth daily. 02/01/14  Yes Rexene Alberts, MD  promethazine (PHENERGAN) 25 MG tablet Take 1 tablet (25 mg total) by mouth every 8 (eight) hours as needed for nausea or vomiting. 01/23/14  Yes Kathyrn Drown, MD    Current Facility-Administered Medications  Medication Dose Route Frequency Provider Last Rate Last Dose  . dextrose 5 %-0.45 % sodium chloride infusion   Intravenous STAT Dorie Rank, MD 100 mL/hr at 02/10/14 0824    . enoxaparin (LOVENOX) injection 40 mg  40 mg Subcutaneous Q24H Oswald Hillock, MD   40 mg at 02/10/14 0807  . hydrALAZINE (APRESOLINE) injection 10 mg  10 mg Intravenous Q6H PRN Oswald Hillock, MD      . HYDROmorphone (DILAUDID) injection 1 mg  1 mg Intravenous Q2H PRN Oswald Hillock, MD   1 mg at 02/10/14 0817  . ondansetron (ZOFRAN) tablet 4 mg  4 mg Oral Q6H PRN Oswald Hillock, MD       Or  . ondansetron (ZOFRAN) injection 4 mg  4 mg Intravenous Q6H PRN Oswald Hillock, MD      . pantoprazole (PROTONIX) 80 mg in sodium chloride 0.9 % 250 mL (0.32 mg/mL) infusion  8 mg/hr Intravenous Continuous Gagan S Lama, MD 25 mL/hr at 02/10/14 0824 8 mg/hr at 02/10/14 0824  . [START ON 02/13/2014] pantoprazole (PROTONIX) injection 40 mg  40 mg Intravenous Q12H Gagan S Lama, MD        Allergies as of 02/09/2014  . (No Known Allergies)    Family History  Problem Relation Age of Onset  . Cancer Mother     Breast  . Colon cancer Neg Hx     History   Social History  . Marital Status: Married    Spouse Name: N/A    Number of Children: N/A  . Years of Education: N/A   Occupational History  . Not on file.    Social History Main Topics  . Smoking status: Current Every Day Smoker -- 1.50 packs/day for 10 years    Types: Cigarettes  . Smokeless tobacco: Never Used  . Alcohol Use: No  . Drug Use: No  . Sexual Activity: Yes    Birth Control/ Protection: None   Other Topics Concern  . Not on file   Social History Narrative  . No narrative on file    Review of Systems: Gen: see HPI CV: Denies chest pain, heart palpitations, syncope, edema  Resp: Denies shortness of breath with rest, cough, wheezing GI: see HPI GU : Denies urinary burning, urinary frequency, urinary incontinence.  MS: Denies joint pain,swelling, cramping Derm: Denies rash, itching, dry skin Psych: Denies depression, anxiety,confusion, or memory loss Heme: Denies bruising, bleeding, and enlarged lymph nodes.  Physical Exam: Vital signs in last 24 hours: Temp:  [98.4 F (36.9 C)-98.9 F (37.2 C)] 98.4 F (36.9 C) (09/01 0602) Pulse Rate:  [90-134] 90 (09/01 0602) Resp:  [20] 20 (08/31 1810) BP: (132-176)/(90-111) 132/90 mmHg (09/01 0602) SpO2:  [95 %-100 %] 97 % (09/01 0602) Weight:  [238 lb (107.956 kg)] 238 lb (107.956 kg) (08/31 2317) Last BM Date: 03/08/14 General:   Alert,  Well-developed, well-nourished, pleasant and cooperative in NAD Head:  Normocephalic and atraumatic. Eyes:  Sclera clear, no icterus.   Conjunctiva pink. Ears:  Normal auditory acuity. Nose:  No deformity, discharge,  or lesions. Mouth:  No deformity or lesions, dentition normal. Lungs:  Clear throughout to auscultation.   No wheezes, crackles, or rhonchi. No acute distress. Heart:  S1 S2 present, no murmurs appreciated.  Abdomen:  Soft, TTP periumbilically and nondistended. No masses, hepatosplenomegaly or hernias noted. Normal bowel sounds, without guarding, and without rebound.   Rectal:  Deferred   Msk:  Symmetrical without gross deformities. Normal posture. Extremities:  Without edema. Neurologic:  Alert and  oriented x4;   grossly normal neurologically. Skin:  Intact without significant lesions or rashes. Cervical Nodes:  No significant cervical adenopathy. Psych:  Alert and cooperative. Normal mood and affect.  Intake/Output from previous day:   Intake/Output this shift: Total I/O In: 1075.8 [I.V.:1075.8] Out: -   Lab Results:  Recent Labs  02/09/14 1822 02/10/14 0525  WBC 18.7* 14.6*  HGB 17.0 13.9  HCT 46.4 39.5  PLT 459* 374   BMET  Recent Labs  02/09/14 1822 02/10/14 0525  NA 141 142  K 3.5* 4.0  CL 96 104  CO2 27 26  GLUCOSE 124* 121*  BUN 15 15  CREATININE 0.86 0.83  CALCIUM 10.8* 9.3   LFT  Recent Labs  02/09/14 1822 02/10/14 0525  PROT 8.4* 6.5  ALBUMIN 4.3 3.1*  AST 13 12  ALT 23 17  ALKPHOS 165* 126*  BILITOT   0.5 0.4   Lab Results  Component Value Date   LIPASE 52 02/09/2014     Studies/Results: Ct Abdomen Pelvis W Contrast  02/09/2014   CLINICAL DATA:  ABDOMINAL PAIN EMESIS  EXAM: CT ABDOMEN AND PELVIS WITH CONTRAST  TECHNIQUE: Multidetector CT imaging of the abdomen and pelvis was performed using the standard protocol following bolus administration of intravenous contrast.  CONTRAST:  144m OMNIPAQUE IOHEXOL 300 MG/ML  SOLN  COMPARISON:  01/30/2014  FINDINGS: Visualized lung bases clear.  Some increase in inflammatory/edematous changes between the gallbladder and pancreatic head, inferior to the pylorus. Wall thickening in the gastric antrum will is suspected. There is a focal outpouching from the gastric lumen along the greater curvature suggesting ulceration. Regional prominent lymph nodes measuring up to 13 mm short axis diameter image 31/2. No focal fluid collection or free air.  Unremarkable liver, spleen, kidneys. Chronic will will Linear coarse calcifications in both adrenal glands as before. There is homogeneous pancreatic parenchymal enhancement without ductal dilatation. Unremarkable aorta and portal vein. Stomach is physiologically distended. Small bowel  and colon are nondilated. Normal appendix. Urinary bladder nondistended. Bilateral pelvic phleboliths. No ascites. Subcentimeter right lower quadrant mesenteric, left para-aortic, aortocaval, and central mesenteric lymph nodes. Lumbar spine unremarkable.  IMPRESSION: 1. Progressive wall thickening in the gastric antrum with focal ulceration suggesting peptic ulcer disease versus ulcerated gastric carcinoma. Recommend endoscopy for further evaluation. 2. Inflammatory/edematous changes inferior to the pylorus with regional adenopathy, likely secondary to the gastric process. No evidence of perforation or abscess.   Electronically Signed   By: DArne ClevelandM.D.   On: 02/09/2014 20:33    Impression: 38year old male with prior history as outlined above, with recent bout with pancreatitis earlier this month, now admitted with persistent abdominal pain, N/V and CT revealing progressive wall thickening in the gastric antrum with focal ulceration. Differentials including PUD but less likely ulcerated gastric carcinoma. Endorses significant NSAID use since early August 2015. Question PUD secondary to NSAID +/- ETOH use. Patient desires to be completely sedated for this procedure, utilizing Propofol. As of note, recent STEMI in early August with negative cardiac cath, question drug reaction from substance at a party.   Isolated elevated alk phos: Monitor. May need further work-up as outpatient. Non-specific in this setting.  Plan: Anticipate EGD with Dr. RGala Romneywith Propofol on 9/2. Risks and benefits discussed in detail Would recommend PPI BID; Protonix drip started on admission. Will continue for now until after EGD.  Clears today Hold Lovenox dose on 9/2 NPO after midnight Outpatient MRCP for further evaluation of prior episode of pancreatitis   AOrvil Feil ANP-BC R[facility] LPGastroenterology      LOS: 1 day    02/10/2014, 9:15 AM

## 2014-02-11 NOTE — Op Note (Signed)
[facility] [address] [place], [address]   ENDOSCOPY PROCEDURE REPORT  PATIENT: Nicolas, [name]  MR#: [id number] BIRTHDATE: 06-12-1976 , 38  yrs. old GENDER: Male ENDOSCOPIST: R.  Roetta Sessions, MD FACP [facility] REFERRED BY:     Hospitalist PROCEDURE DATE:  02/11/2014 PROCEDURE:     EGD with gastric biopsy  INDICATIONS:     Nausea and vomiting; abnormal antrum on CT  INFORMED CONSENT:   The risks, benefits, limitations, alternatives and imponderables have been discussed.  The potential for biopsy, esophogeal dilation, etc. have also been reviewed.  Questions have been answered.  All parties agreeable.  Please see the history and physical in the medical record for more information.  MEDICATIONS:      Deep sedation per Dr. [facility]  DESCRIPTION OF PROCEDURE:   The     endoscope was introduced through the mouth and advanced to the second portion of the duodenum without difficulty or limitations.  The mucosal surfaces were surveyed very carefully during advancement of the scope and upon withdrawal.  Retroflexion view of the proximal stomach and esophagogastric junction was performed.      FINDINGS: Circumferential distal esophageal erosions within 5-8 mm the GE junction consistent with erosive reflux esophagitis. No Barrett's esophagus. No esophageal varices. Stomach empty. Small hiatal hernia. Antral and prepylorus markedly abnormal with a huge "U" shaped area of ulceration partially encircling the pyloric channel.  This was a large area of deep ulceration with a clean base. The pylorus remained patent and was easily traversed with the scope. Examination of bulb and second portion revealed no mucosal abnormality.   THERAPEUTIC / DIAGNOSTIC MANEUVERS PERFORMED:  Biopsies of the ulcer crater taken for histologic study   COMPLICATIONS:  None  IMPRESSION:   Erosive reflux esophagitis. Large area of prepyloric/antral ulceration. Status post  biopsy. This inflammatory process likely extends to the area of the pancreatic head producing "bystander" pancreatitis. Mildly elevated serum lipase is nonspecific. I doubt he has recently had true pancreatitis.  RECOMMENDATIONS:   Twice a day proton pump inhibitor therapy. No recent significant bleeding, therefore, may stop his intravenous infusion and start him on oral therapy twice a day (i.e. Protonix 40 mg twice daily) x12 weeks. Add Carafate suspension 1 g 4 times a day x2 weeks.  Absolutely refrain from all forms of nonsteroidal agents and alcohol.  Followup on pathology.  Repeat EGD in 12 weeks to verify ulcer healing.  Advance diet. Would consider hospital discharge once he is tolerating a diet without further nausea or vomiting.    _______________________________ R. Roetta Sessions, MD FACP [facility] eSigned:  R. Roetta Sessions, MD FACP [facility] 02/11/2014 3:12 PM     CC:  PATIENT NAME:  Nicolas, [name] MR#: [id number]

## 2014-02-11 NOTE — Interval H&P Note (Signed)
History and Physical Interval Note:  02/11/2014 1:23 PM  Nicolas [name]  has presented today for surgery, with the diagnosis of gastric abnormality on CT  The various methods of treatment have been discussed with the patient and family. After consideration of risks, benefits and other options for treatment, the patient has consented to  Procedure(s): ESOPHAGOGASTRODUODENOSCOPY (EGD) WITH PROPOFOL (N/A) as a surgical intervention .  The patient's history has been reviewed, patient examined, no change in status, stable for surgery.  I have reviewed the patient's chart and labs.  Questions were answered to the patient's satisfaction.     [name]  Patient seen and examined today. Agree with need for EGD. He will need deep sedation for the procedure. The risks, benefits, limitations, alternatives and imponderables have been reviewed with the patient. Potential for esophageal dilation, biopsy, etc. have also been reviewed.  Questions have been answered. All parties agreeable.

## 2014-02-11 NOTE — Transfer of Care (Signed)
Immediate Anesthesia Transfer of Care Note  Patient: Nicolas [name]  Procedure(s) Performed: Procedure(s): ESOPHAGOGASTRODUODENOSCOPY (EGD) WITH PROPOFOL (N/A) BIOPSY (N/A)  Patient Location: PACU  Anesthesia Type:MAC  Level of Consciousness: awake, alert  and patient cooperative  Airway & Oxygen Therapy: Patient Spontanous Breathing, Patient connected to nasal cannula oxygen and Patient connected to T-piece oxygen  Post-op Assessment: Report given to PACU RN, Post -op Vital signs reviewed and stable and Patient moving all extremities  Post vital signs: Reviewed and stable  Complications: No apparent anesthesia complications

## 2014-02-11 NOTE — Anesthesia Preprocedure Evaluation (Signed)
Anesthesia Evaluation  Patient identified by MRN, date of birth, ID band Patient awake    Reviewed: Allergy & Precautions, H&P , NPO status , Patient's Chart, lab work & pertinent test results  Airway Mallampati: II TM Distance: >3 FB     Dental  (+) Teeth Intact, Partial Upper   Pulmonary Current Smoker,  breath sounds clear to auscultation        Cardiovascular hypertension, Pt. on medications - Past MI (neg troponin, neg cath, 70%EF normal wall motion  - doubtful of STEMI hx.) Rhythm:Regular Rate:Normal     Neuro/Psych    GI/Hepatic PUD (pancreatitis),   Endo/Other    Renal/GU Renal disease     Musculoskeletal   Abdominal   Peds  Hematology   Anesthesia Other Findings   Reproductive/Obstetrics                           Anesthesia Physical Anesthesia Plan  ASA: III  Anesthesia Plan: MAC   Post-op Pain Management:    Induction: Intravenous  Airway Management Planned: Simple Face Mask  Additional Equipment:   Intra-op Plan:   Post-operative Plan:   Informed Consent: I have reviewed the patients History and Physical, chart, labs and discussed the procedure including the risks, benefits and alternatives for the proposed anesthesia with the patient or authorized representative who has indicated his/her understanding and acceptance.     Plan Discussed with:   Anesthesia Plan Comments:         Anesthesia Quick Evaluation

## 2014-02-11 NOTE — Progress Notes (Signed)
[facility] PROGRESS NOTE  Nicolas [name] ZOX:[id number] DOB: 1975/10/23 DOA: 02/09/2014 PCP: Lilyan Punt, MD  38 year old obese male with history of HTn recent hospitalization for pancreatitis who was readmitted last week again for nausea vomiting and thought to be recurrent pancreatitis and discharged home 2 days back presented to the ED with nausea and vomiting and abdominal pain.Marland Kitchen He had almost 10 episodes of vomiting on the day of admission. A CT scan of the abdomen and pelvis was repeated in the ED which showed a progressive wall thickening of the gastric antrum with focal ulceration suggestive of peptic ulcer disease versus ulcerated gastric carcinoma and inflammatory/edematus  changes inferior to the pylorus. GI consulted for rec's.  Assessment/Plan: Abdominal pain with large ulcer on endo 02/11/14 -appreciate GI input. -Monitor H&H. Counseled strongly on smoking cessation. -Supportive care with IV fluids and antiemetics. -needs Endoscopy 12 weeks after d/c home from hospital -BID protonix 40 until that time  Leukocytosis Likely reactive. resolved  Pancreatitis Lipase normalized thought to be more in keeping with inflammation of pancreas /2 to being close to area of ulcer  Tobacco abuse Consult strongly on suggestion again. Continue nicotine patch  Hypertension Re-start amlodipine 10 daily     DIET: heart healthy diet DVT prophylaxis: Subcutaneous Lovenox  Code Status: Full code Family Communication: None bedside Disposition Plan: Home in 1-2 days   Consultants:  GI (Dr. Nadeen Landau)  Procedures:  EGD on 9/2  Antibiotics:  None  HPI/Subjective: Doing fair No pain in abdomen, but is "sore" denies n/v/melena  Objective: Filed Vitals:   02/11/14 1505  BP: 131/79  Pulse: 91  Temp:   Resp: 13    Intake/Output Summary (Last 24 hours) at 02/11/14 1529 Last data filed at 02/11/14 1455  Gross per 24 hour  Intake    900 ml  Output      0 ml   Net    900 ml   Filed Weights   02/09/14 1736 02/09/14 2317  Weight: 107.956 kg (238 lb) 107.956 kg (238 lb)    Exam:  General: Middle aged obese male in no acute distress  HEENT: No pallor, icterus Chest: Clear CVS: Normal S1 and S2  Abdomen: Soft, nondistended, mild epigastric tenderness, bowel sounds present  Extremities: Warm, no edema   Data Reviewed: Basic Metabolic Panel:  Recent Labs Lab 02/06/14 1446 02/07/14 0612 02/09/14 1822 02/10/14 0525  NA 139 139 141 142  K 4.7 4.3 3.5* 4.0  CL 100 102 96 104  CO2 GLUCOSE 105* 82 124* 121*  BUN CREATININE 0.91 0.87 0.86 0.83  CALCIUM 10.5 9.3 10.8* 9.3   Liver Function Tests:  Recent Labs Lab 02/06/14 1446 02/09/14 1822 02/10/14 0525  AST ALT ALKPHOS 156* 165* 126*  BILITOT 0.5 0.5 0.4  PROT 7.7 8.4* 6.5  ALBUMIN 3.9 4.3 3.1*    Recent Labs Lab 02/06/14 1446 02/07/14 0612 02/09/14 1822  LIPASE 170* 60* 52  AMYLASE 102  --   --    No results found for this basename: AMMONIA,  in the last 168 hours CBC:  Recent Labs Lab 02/06/14 1446 02/07/14 0612 02/09/14 1822 02/10/14 0525 02/11/14 0557  WBC 11.2* 11.8* 18.7* 14.6* 10.4  NEUTROABS 7.5  --  15.4*  --   --   HGB 16.0 13.6 17.0 13.9 14.4  HCT 43.9 38.9* 46.4 39.5 40.8  MCV 87.8 89.4 86.9 88.8 88.5  PLT  402* 346 459* 374 382   Cardiac Enzymes: No results found for this basename: CKTOTAL, CKMB, CKMBINDEX, TROPONINI,  in the last 168 hours BNP (last 3 results) No results found for this basename: PROBNP,  in the last 8760 hours CBG: No results found for this basename: GLUCAP,  in the last 168 hours  No results found for this or any previous visit (from the past 240 hour(s)).   Studies: Ct Abdomen Pelvis W Contrast  02/09/2014   CLINICAL DATA:  ABDOMINAL PAIN EMESIS  EXAM: CT ABDOMEN AND PELVIS WITH CONTRAST  TECHNIQUE: Multidetector CT imaging of the abdomen and pelvis was performed using the  standard protocol following bolus administration of intravenous contrast.  CONTRAST:  OMNIPAQUE IOHEXOL 300 MG/ML  SOLN  COMPARISON:  01/30/2014  FINDINGS: Visualized lung bases clear.  Some increase in inflammatory/edematous changes between the gallbladder and pancreatic head, inferior to the pylorus. Wall thickening in the gastric antrum will is suspected. There is a focal outpouching from the gastric lumen along the greater curvature suggesting ulceration. Regional prominent lymph nodes measuring up to 13 mm short axis diameter image 31/2. No focal fluid collection or free air.  Unremarkable liver, spleen, kidneys. Chronic will will Linear coarse calcifications in both adrenal glands as before. There is homogeneous pancreatic parenchymal enhancement without ductal dilatation. Unremarkable aorta and portal vein. Stomach is physiologically distended. Small bowel and colon are nondilated. Normal appendix. Urinary bladder nondistended. Bilateral pelvic phleboliths. No ascites. Subcentimeter right lower quadrant mesenteric, left para-aortic, aortocaval, and central mesenteric lymph nodes. Lumbar spine unremarkable.  IMPRESSION: 1. Progressive wall thickening in the gastric antrum with focal ulceration suggesting peptic ulcer disease versus ulcerated gastric carcinoma. Recommend endoscopy for further evaluation. 2. Inflammatory/edematous changes inferior to the pylorus with regional adenopathy, likely secondary to the gastric process. No evidence of perforation or abscess.   Electronically Signed   By: Oley Balm M.D.   On: 02/09/2014 20:33    Scheduled Meds: . [MAR HOLD] feeding supplement (PRO-STAT SUGAR FREE 64)  30 mL Oral TID WC  . [MAR HOLD] feeding supplement (RESOURCE BREEZE)  1 Container Oral TID BM  . [facility] HOLD] nicotine  21 mg Transdermal Daily  . [MAR HOLD] pantoprazole (PROTONIX) IV  40 mg Intravenous Q12H  . sucralfate  1 g Oral TID WC & HS   Continuous Infusions: . sodium chloride     . lactated ringers 75 mL/hr at 02/11/14 1347  . pantoprozole (PROTONIX) infusion 8 mg/hr (02/10/14 2205)     Time spent: 15 minutes  Pleas Koch, MD [facility] [phone]

## 2014-02-11 NOTE — H&P (View-Only) (Signed)
REVIEWED. AGREE. NO ADDITIONAL RECOMMENDATIONS. 

## 2014-02-12 ENCOUNTER — Telehealth: Payer: Self-pay | Admitting: Gastroenterology

## 2014-02-12 ENCOUNTER — Encounter (HOSPITAL_COMMUNITY): Payer: Self-pay | Admitting: Internal Medicine

## 2014-02-12 MED ORDER — NICOTINE 21 MG/24HR TD PT24: 21.0000 mg | MEDICATED_PATCH | Freq: Every day | TRANSDERMAL | Status: DC

## 2014-02-12 MED ORDER — SUCRALFATE 1 G PO TABS: 1.0000 g | ORAL_TABLET | Freq: Three times a day (TID) | ORAL | Status: DC

## 2014-02-12 MED ORDER — PANTOPRAZOLE SODIUM 40 MG PO TBEC: 40.0000 mg | DELAYED_RELEASE_TABLET | Freq: Two times a day (BID) | ORAL | Status: DC

## 2014-02-12 MED ORDER — NICOTINE POLACRILEX 2 MG MT GUM: 2.0000 mg | CHEWING_GUM | OROMUCOSAL | Status: DC | PRN

## 2014-02-12 NOTE — Progress Notes (Signed)
Patient discharged home this morning,with instructions given on medications,and follow up visits,patient verbalized understanding.Prescriptions sent to Pharmacy of choice documented on AVS. Vital signs are stable,no c/o pain or discomfort noted,patient is excited about going home. Staff accompanied patient to awaiting vehicle.

## 2014-02-12 NOTE — Telephone Encounter (Signed)
Patient discharged today. He will need OV in 10 weeks to schedule repeat EGD for large ulcer.   Please touch base with him and make sure he understands he will need to take pantoprazole 40mg  BID until his next EGD in 12 weeks or so, after that we will give further recommendations.   He has RX sent by hospitalist but only for #30 days. I will send one with refills.

## 2014-02-12 NOTE — Telephone Encounter (Signed)
Pt has an appointment on 04/23/14 at 10:30

## 2014-02-12 NOTE — Telephone Encounter (Signed)
Pt is aware.  

## 2014-02-12 NOTE — Discharge Summary (Signed)
Physician Discharge Summary  Nicolas [name] ZOX:[id number] DOB: June 02, 1976 DOA: 02/09/2014  PCP: Lilyan Punt, MD  Admit date: 02/09/2014 Discharge date: 02/12/2014  Time spent: 22 minutes  Recommendations for Outpatient Follow-up:  1. Patient to have followup with gastroenterology and will also need repeat endoscopy in 12 weeks 2. Will need followup with H. pylori as well as pathology from biopsy 3. Needs basic metabolic panel, CBC in about one week 4. Needs surveillance monitoring of ulcer   Discharge Diagnoses:  Active Problems:   Essential hypertension, benign   Nausea with vomiting   Tobacco use disorder   Gastric ulcer   Peptic ulcer disease   Protein-calorie malnutrition, severe   Discharge Condition: Good  Diet recommendation: Soft diet  Filed Weights   02/09/14 1736 02/09/14 2317  Weight: 107.956 kg (238 lb) 107.956 kg (238 lb)    History of present illness:  38 year old obese male with history of HTn recent hospitalization for pancreatitis who was readmitted last week again for nausea vomiting and thought to be recurrent pancreatitis and discharged home 2 days back presented to the ED with nausea and vomiting and abdominal pain.Marland Kitchen He had almost 10 episodes of vomiting on the day of admission. A CT scan of the abdomen and pelvis was repeated in the ED which showed a progressive wall thickening of the gastric antrum with focal ulceration suggestive of peptic ulcer disease versus ulcerated gastric carcinoma and inflammatory/edematus changes inferior to the pylorus.  GI consulted for rec's   Hospital Course:  Abdominal pain with large ulcer on endo 02/11/14  -appreciate GI input.  -Monitor H&H. Counseled strongly on smoking cessation.  -Supportive care with IV fluids and antiemetics.  -needs Endoscopy 12 weeks after d/c home from hospital  -BID protonix 40 until that time  -Carafate to continue from 9/2-9/16 2015 -I've advised him to control his headache pain likely he  should control and he agrees that this may be a good idea Leukocytosis  Likely reactive. resolved  Pancreatitis  Lipase normalized  thought to be more in keeping with inflammation of pancreas 2/2 to being close to area of ulcer  Defer to gastroenterology need for MRCP as an outpatient however do not think that this is required Recent ultrasound 01/30/14 showed no evidence is whatsoever of cholecystitis or gallbladder sludge Tobacco abuse  Consult strongly on suggestion again. Continue nicotine patch as well as nicotine gum prescribed on discharge Hypertension  Re-start amlodipine 10 daily -Blood pressure stable in the hospital   Procedures:  Endoscopy 02/11/49  Consultations:  Gastroenterology  Discharge Exam: Ceasar Mons Vitals:   02/12/14 0456  BP: 148/82  Pulse:   Temp: 98.5 F (36.9 C)  Resp: 20    General: Alert pleasant anxious to go home Cardiovascular: S1-S2 no murmur rub or gallop Respiratory: Clinically clear to  Discharge Instructions You were cared for by a hospitalist during your hospital stay. If you have any questions about your discharge medications or the care you received while you were in the hospital after you are discharged, you can call the unit and asked to speak with the hospitalist on call if the hospitalist that took care of you is not available. Once you are discharged, your primary care physician will handle any further medical issues. Please note that NO REFILLS for any discharge medications will be authorized once you are discharged, as it is imperative that you return to your primary care physician (or establish a relationship with a primary care physician if you do not have one) for  your aftercare needs so that they can reassess your need for medications and monitor your lab values.  Discharge Instructions   Diet - low sodium heart healthy    Complete by:  As directed      Discharge instructions    Complete by:  As directed   Follow with Dr. Jena Gauss in  12 weeks Continue carafate for 2 weeks ending 9/16 Cont1nue Protonix 40 twice a day [notice the dose change] until you see Dr. Jena Gauss Follow with PCP in about 2 weeks     Increase activity slowly    Complete by:  As directed           Current Discharge Medication List    START taking these medications   Details  nicotine (NICODERM CQ - DOSED IN MG/24 HOURS) 21 mg/24hr patch Place 1 patch (21 mg total) onto the skin daily. Qty: 28 patch, Refills: 0    nicotine polacrilex (NICORETTE) 2 MG gum Take 1 each (2 mg total) by mouth as needed for smoking cessation. Qty: 100 tablet, Refills: 0    sucralfate (CARAFATE) 1 G tablet Take 1 tablet (1 g total) by mouth 4 (four) times daily -  with meals and at bedtime. Qty: 60 tablet, Refills: 0      CONTINUE these medications which have CHANGED   Details  pantoprazole (PROTONIX) 40 MG tablet Take 1 tablet (40 mg total) by mouth 2 (two) times daily. Qty: 60 tablet, Refills: 0      CONTINUE these medications which have NOT CHANGED   Details  amLODipine (NORVASC) 10 MG tablet Take 1 tablet (10 mg total) by mouth daily. Qty: 30 tablet, Refills: 2    HYDROcodone-acetaminophen (NORCO) 7.5-325 MG per tablet Take 2 tablets by mouth every 4 (four) hours as needed for severe pain (pain). Qty: 40 tablet, Refills: 0    nystatin cream (MYCOSTATIN) Apply 1 application topically 3 (three) times daily. Apply tid groin area prn Qty: 30 g, Refills: 0    promethazine (PHENERGAN) 25 MG tablet Take 1 tablet (25 mg total) by mouth every 8 (eight) hours as needed for nausea or vomiting. Qty: 20 tablet, Refills: 0       No Known Allergies    The results of significant diagnostics from this hospitalization (including imaging, microbiology, ancillary and laboratory) are listed below for reference.    Significant Diagnostic Studies: Dg Chest 2 View  01/19/2014   CLINICAL DATA:  Chest pain; recent cardiac arrest  EXAM: CHEST  2 VIEW  COMPARISON:  January 18, 2014  FINDINGS: Patchy atelectasis is noted in the left base region. Elsewhere lungs are clear. Heart is mildly enlarged with pulmonary vascularity within normal limits. No adenopathy. No bone lesions are appreciable. There is degenerative change in the thoracic spine. No pneumothorax.  IMPRESSION: Areas of left lower lobe atelectatic change. Lungs elsewhere clear. No apparent pneumothorax. Stable mild cardiac prominence.   Electronically Signed   By: Bretta Bang M.D.   On: 01/19/2014 07:35   Ct Head Wo Contrast  01/18/2014   CLINICAL DATA:  Altered mental status. Status post cardiac catheterization.  EXAM: CT HEAD WITHOUT CONTRAST  TECHNIQUE: Contiguous axial images were obtained from the base of the skull through the vertex without intravenous contrast.  COMPARISON:  None.  FINDINGS: There is no evidence of acute infarction, mass lesion, or intra- or extra-axial hemorrhage on CT.  The posterior fossa, including the cerebellum, brainstem and fourth ventricle, is within normal limits. The third and lateral  ventricles, and basal ganglia are unremarkable in appearance. The cerebral hemispheres are symmetric in appearance, with normal gray-white differentiation. No mass effect or midline shift is seen.  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 The visualized portions of the orbits are within normal limits. The paranasal sinuses and mastoid air cells are well-aerated. No significant soft tissue abnormalities are seen.  IMPRESSION: 1. No acute intracranial pathology seen on CT. 2. 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   Electronically Signed   By: Roanna Raider M.D.   On: 01/18/2014 06:23   Ct Abdomen Pelvis W Contrast  02/09/2014   CLINICAL DATA:  ABDOMINAL PAIN EMESIS  EXAM: CT ABDOMEN AND PELVIS WITH CONTRAST  TECHNIQUE: Multidetector CT imaging of the abdomen and pelvis was performed using the standard protocol following bolus  administration of intravenous contrast.  CONTRAST:  OMNIPAQUE IOHEXOL 300 MG/ML  SOLN  COMPARISON:  01/30/2014  FINDINGS: Visualized lung bases clear.  Some increase in inflammatory/edematous changes between the gallbladder and pancreatic head, inferior to the pylorus. Wall thickening in the gastric antrum will is suspected. There is a focal outpouching from the gastric lumen along the greater curvature suggesting ulceration. Regional prominent lymph nodes measuring up to 13 mm short axis diameter image 31/2. No focal fluid collection or free air.  Unremarkable liver, spleen, kidneys. Chronic will will Linear coarse calcifications in both adrenal glands as before. There is homogeneous pancreatic parenchymal enhancement without ductal dilatation. Unremarkable aorta and portal vein. Stomach is physiologically distended. Small bowel and colon are nondilated. Normal appendix. Urinary bladder nondistended. Bilateral pelvic phleboliths. No ascites. Subcentimeter right lower quadrant mesenteric, left para-aortic, aortocaval, and central mesenteric lymph nodes. Lumbar spine unremarkable.  IMPRESSION: 1. Progressive wall thickening in the gastric antrum with focal ulceration suggesting peptic ulcer disease versus ulcerated gastric carcinoma. Recommend endoscopy for further evaluation. 2. Inflammatory/edematous changes inferior to the pylorus with regional adenopathy, likely secondary to the gastric process. No evidence of perforation or abscess.   Electronically Signed   By: Oley Balm M.D.   On: 02/09/2014 20:33   Ct Abdomen Pelvis W Contrast  01/30/2014   CLINICAL DATA:  Upper abdominal pain since undergoing CPR 2 weeks ago.  EXAM: CT ABDOMEN AND PELVIS WITH CONTRAST  TECHNIQUE: Multidetector CT imaging of the abdomen and pelvis was performed using the standard protocol following bolus administration of intravenous contrast.  CONTRAST:  50 mL OMNIPAQUE IOHEXOL 300 MG/ML  SOLN  COMPARISON:  CT abdomen and  pelvis 03/03/2012 and 01/10/2010.  FINDINGS: Lung bases are clear.  No pleural or pericardial effusion.  The gallbladder, liver, spleen, pancreas and right kidney are unremarkable. Calcified adrenal glands are consistent with prior infection or hemorrhage. Tiny exophytic lesion off the upper pole left kidney is likely a cyst and unchanged. The stomach, small and large bowel and appendix all appear normal. No fracture or worrisome bony lesion is seen. Small sclerotic lesion in the proximal right femur is unchanged and consistent with a bone island.  IMPRESSION: No acute finding.  Stable compared to prior exam.   Electronically Signed   By: Drusilla Kanner M.D.   On: 01/30/2014 15:26   US Abdomen Limited  01/30/2014   CLINICAL DATA:  Abdominal pain for 2 weeks.  EXAM: US ABDOMEN LIMITED - RIGHT UPPER QUADRANT  COMPARISON:  CT abdomen and pelvis 03/03/2012.  FINDINGS: Gallbladder:  No gallstones or wall thickening visualized. No sonographic Murphy sign noted.  Common bile  duct:  Diameter: 0.3 cm.  Liver:  No focal lesion identified. Within normal limits in parenchymal echogenicity.  IMPRESSION: Negative for gallstones.  Negative examination.   Electronically Signed   By: Drusilla Kanner M.D.   On: 01/30/2014 14:56   Dg Chest Portable 1 View  01/18/2014   CLINICAL DATA:  Myocardial infarction, chest pain  EXAM: PORTABLE CHEST - 1 VIEW  COMPARISON:  10/30/2012  FINDINGS: Heart size upper normal to mildly enlarged. Vascular pattern normal. Lungs clear.  IMPRESSION: No acute finding   Electronically Signed   By: Esperanza Heir M.D.   On: 01/18/2014 01:46    Microbiology: No results found for this or any previous visit (from the past 240 hour(s)).   Labs: Basic Metabolic Panel:  Recent Labs Lab 02/06/14 1446 02/07/14 0612 02/09/14 1822 02/10/14 0525  NA 139 139 141 142  K 4.7 4.3 3.5* 4.0  CL 100 102 96 104  CO2 GLUCOSE 105* 82 124* 121*  BUN CREATININE 0.91 0.87 0.86  0.83  CALCIUM 10.5 9.3 10.8* 9.3   Liver Function Tests:  Recent Labs Lab 02/06/14 1446 02/09/14 1822 02/10/14 0525  AST ALT ALKPHOS 156* 165* 126*  BILITOT 0.5 0.5 0.4  PROT 7.7 8.4* 6.5  ALBUMIN 3.9 4.3 3.1*    Recent Labs Lab 02/06/14 1446 02/07/14 0612 02/09/14 1822  LIPASE 170* 60* 52  AMYLASE 102  --   --    No results found for this basename: AMMONIA,  in the last 168 hours CBC:  Recent Labs Lab 02/06/14 1446 02/07/14 0612 02/09/14 1822 02/10/14 0525 02/11/14 0557  WBC 11.2* 11.8* 18.7* 14.6* 10.4  NEUTROABS 7.5  --  15.4*  --   --   HGB 16.0 13.6 17.0 13.9 14.4  HCT 43.9 38.9* 46.4 39.5 40.8  MCV 87.8 89.4 86.9 88.8 88.5  PLT 402* 346 459* 374 382   Cardiac Enzymes: No results found for this basename: CKTOTAL, CKMB, CKMBINDEX, TROPONINI,  in the last 168 hours BNP: BNP (last 3 results) No results found for this basename: PROBNP,  in the last 8760 hours CBG: No results found for this basename: GLUCAP,  in the last 168 hours     Signed:  Rhetta Mura  [facility] 02/12/2014, 7:57 AM

## 2014-02-14 ENCOUNTER — Encounter: Payer: Self-pay | Admitting: Internal Medicine

## 2014-02-17 ENCOUNTER — Telehealth: Payer: Self-pay

## 2014-02-17 ENCOUNTER — Emergency Department (HOSPITAL_COMMUNITY)
Admission: EM | Admit: 2014-02-17 | Discharge: 2014-02-17 | Disposition: A | Discharge status: Home / Self Care | Payer: 59 | Attending: Emergency Medicine | Admitting: Emergency Medicine

## 2014-02-17 ENCOUNTER — Encounter (HOSPITAL_COMMUNITY): Payer: Self-pay | Admitting: Emergency Medicine

## 2014-02-17 ENCOUNTER — Other Ambulatory Visit: Payer: Self-pay

## 2014-02-17 ENCOUNTER — Emergency Department (HOSPITAL_COMMUNITY): Payer: 59

## 2014-02-17 DIAGNOSIS — R1013 Epigastric pain: Secondary | ICD-10-CM | POA: Insufficient documentation

## 2014-02-17 DIAGNOSIS — F17200 Nicotine dependence, unspecified, uncomplicated: Secondary | ICD-10-CM | POA: Insufficient documentation

## 2014-02-17 DIAGNOSIS — I10 Essential (primary) hypertension: Secondary | ICD-10-CM | POA: Diagnosis not present

## 2014-02-17 DIAGNOSIS — R000 Tachycardia, unspecified: Secondary | ICD-10-CM | POA: Insufficient documentation

## 2014-02-17 DIAGNOSIS — Z79899 Other long term (current) drug therapy: Secondary | ICD-10-CM | POA: Insufficient documentation

## 2014-02-17 DIAGNOSIS — Z9889 Other specified postprocedural states: Secondary | ICD-10-CM | POA: Diagnosis not present

## 2014-02-17 DIAGNOSIS — Z87442 Personal history of urinary calculi: Secondary | ICD-10-CM | POA: Insufficient documentation

## 2014-02-17 DIAGNOSIS — K259 Gastric ulcer, unspecified as acute or chronic, without hemorrhage or perforation: Secondary | ICD-10-CM | POA: Insufficient documentation

## 2014-02-17 DIAGNOSIS — I252 Old myocardial infarction: Secondary | ICD-10-CM | POA: Insufficient documentation

## 2014-02-17 DIAGNOSIS — R112 Nausea with vomiting, unspecified: Secondary | ICD-10-CM | POA: Insufficient documentation

## 2014-02-17 DIAGNOSIS — Z8719 Personal history of other diseases of the digestive system: Secondary | ICD-10-CM | POA: Diagnosis not present

## 2014-02-17 DIAGNOSIS — D72829 Elevated white blood cell count, unspecified: Secondary | ICD-10-CM | POA: Diagnosis not present

## 2014-02-17 LAB — CBC
HCT: 42.9 % (ref 39.0–52.0)
HEMOGLOBIN: 15.1 g/dL (ref 13.0–17.0)
MCH: 31.4 pg (ref 26.0–34.0)
MCHC: 35.2 g/dL (ref 30.0–36.0)
MCV: 89.2 fL (ref 78.0–100.0)
Platelets: 387 10*3/uL (ref 150–400)
RBC: 4.81 MIL/uL (ref 4.22–5.81)
RDW: 13.5 % (ref 11.5–15.5)
WBC: 18.1 10*3/uL — AB (ref 4.0–10.5)

## 2014-02-17 LAB — BASIC METABOLIC PANEL
Anion gap: 13 (ref 5–15)
BUN: 21 mg/dL (ref 6–23)
CHLORIDE: 103 meq/L (ref 96–112)
CO2: 30 mEq/L (ref 19–32)
Calcium: 9.3 mg/dL (ref 8.4–10.5)
Creatinine, Ser: 1.03 mg/dL (ref 0.50–1.35)
GFR calc Af Amer: >90 mL/min (ref >90)
GFR calc non Af Amer: >90 mL/min (ref >90)
Glucose, Bld: 117 mg/dL — ABNORMAL HIGH (ref 70–99)
POTASSIUM: 4.2 meq/L (ref 3.7–5.3)
Sodium: 146 mEq/L (ref 137–147)

## 2014-02-17 LAB — COMPREHENSIVE METABOLIC PANEL
ALBUMIN: 4.4 g/dL (ref 3.5–5.2)
ALK PHOS: 156 U/L — AB (ref 39–117)
ALT: 25 U/L (ref 0–53)
ANION GAP: 20 — AB (ref 5–15)
AST: 12 U/L (ref 0–37)
BUN: 22 mg/dL (ref 6–23)
CO2: 31 mEq/L (ref 19–32)
CREATININE: 1.1 mg/dL (ref 0.50–1.35)
Calcium: 11.1 mg/dL — ABNORMAL HIGH (ref 8.4–10.5)
Chloride: 92 mEq/L — ABNORMAL LOW (ref 96–112)
GFR calc Af Amer: >90 mL/min (ref >90)
GFR calc non Af Amer: 84 mL/min — ABNORMAL LOW (ref >90)
Glucose, Bld: 148 mg/dL — ABNORMAL HIGH (ref 70–99)
POTASSIUM: 3.9 meq/L (ref 3.7–5.3)
Sodium: 143 mEq/L (ref 137–147)
TOTAL PROTEIN: 8.7 g/dL — AB (ref 6.0–8.3)
Total Bilirubin: 0.5 mg/dL (ref 0.3–1.2)

## 2014-02-17 LAB — LIPASE, BLOOD: Lipase: 29 U/L (ref 11–59)

## 2014-02-17 LAB — CBC WITH DIFFERENTIAL/PLATELET
Basophils Absolute: 0 10*3/uL (ref 0.0–0.1)
Basophils Relative: 0 % (ref 0–1)
Eosinophils Absolute: 0 10*3/uL (ref 0.0–0.7)
Eosinophils Relative: 0 % (ref 0–5)
HEMATOCRIT: 48.2 % (ref 39.0–52.0)
HEMOGLOBIN: 17.7 g/dL — AB (ref 13.0–17.0)
LYMPHS ABS: 2.1 10*3/uL (ref 0.7–4.0)
LYMPHS PCT: 10 % — AB (ref 12–46)
MCH: 32 pg (ref 26.0–34.0)
MCHC: 36.7 g/dL — ABNORMAL HIGH (ref 30.0–36.0)
MCV: 87.2 fL (ref 78.0–100.0)
MONO ABS: 1 10*3/uL (ref 0.1–1.0)
Monocytes Relative: 5 % (ref 3–12)
Neutro Abs: 17.7 10*3/uL — ABNORMAL HIGH (ref 1.7–7.7)
Neutrophils Relative %: 85 % — ABNORMAL HIGH (ref 43–77)
Platelets: 451 10*3/uL — ABNORMAL HIGH (ref 150–400)
RBC: 5.53 MIL/uL (ref 4.22–5.81)
RDW: 13.4 % (ref 11.5–15.5)
WBC: 20.9 10*3/uL — ABNORMAL HIGH (ref 4.0–10.5)

## 2014-02-17 MED ORDER — GI COCKTAIL ~~LOC~~: 30.0000 mL | Freq: Once | ORAL | Status: DC

## 2014-02-17 MED ORDER — SODIUM CHLORIDE 0.9 % IV BOLUS (SEPSIS)
1000.0000 mL | Freq: Once | INTRAVENOUS | Status: AC
  Administered 2014-02-17: 1000 mL via INTRAVENOUS

## 2014-02-17 MED ORDER — HYDROCODONE-ACETAMINOPHEN 5-325 MG PO TABS
1.0000 | ORAL_TABLET | ORAL | Status: DC | PRN
Start: 2014-02-17 — End: 2014-04-01

## 2014-02-17 MED ORDER — GI COCKTAIL ~~LOC~~
ORAL | Status: AC
  Filled 2014-02-17: qty 30

## 2014-02-17 MED ORDER — HYDROCODONE-ACETAMINOPHEN 5-325 MG PO TABS
ORAL_TABLET | ORAL | Status: AC
  Filled 2014-02-17: qty 1

## 2014-02-17 MED ORDER — MORPHINE SULFATE 4 MG/ML IJ SOLN
INTRAMUSCULAR | Status: AC
  Filled 2014-02-17: qty 1

## 2014-02-17 MED ORDER — METOCLOPRAMIDE HCL 5 MG/ML IJ SOLN
10.0000 mg | Freq: Once | INTRAMUSCULAR | Status: AC
  Administered 2014-02-17: 10 mg via INTRAVENOUS
  Filled 2014-02-17: qty 2

## 2014-02-17 MED ORDER — HYDROCODONE-ACETAMINOPHEN 5-325 MG PO TABS
1.0000 | ORAL_TABLET | Freq: Once | ORAL | Status: AC
  Administered 2014-02-17: 1 via ORAL

## 2014-02-17 MED ORDER — MORPHINE SULFATE 4 MG/ML IJ SOLN
4.0000 mg | Freq: Once | INTRAMUSCULAR | Status: AC
  Administered 2014-02-17: 4 mg via INTRAVENOUS

## 2014-02-17 MED ORDER — METOCLOPRAMIDE HCL 10 MG PO TABS: 10.0000 mg | ORAL_TABLET | Freq: Four times a day (QID) | ORAL | Status: DC

## 2014-02-17 MED ORDER — SODIUM CHLORIDE 0.9 % IV BOLUS (SEPSIS)
2000.0000 mL | Freq: Once | INTRAVENOUS | Status: AC
  Administered 2014-02-17: 2000 mL via INTRAVENOUS

## 2014-02-17 MED ORDER — FAMOTIDINE IN NACL 20-0.9 MG/50ML-% IV SOLN
20.0000 mg | Freq: Once | INTRAVENOUS | Status: AC
  Administered 2014-02-17: 20 mg via INTRAVENOUS
  Filled 2014-02-17: qty 50

## 2014-02-17 MED ORDER — GI COCKTAIL ~~LOC~~
30.0000 mL | Freq: Once | ORAL | Status: AC
  Administered 2014-02-17: 30 mL via ORAL

## 2014-02-17 NOTE — Discharge Instructions (Signed)
Abdominal Pain Take the medication prescribed tonight for nausea instead of Phenergan, as it seemed to work better for you. Keep your scheduled appointment with Dr.Luking tomorrow at 8:30 AM. Return if you're unable to hold down fluids without vomiting or if you feel worse for any reason. Ask to get your blood pressure rechecked tomorrow at your office visit, as tonight's was elevated Many things can cause abdominal pain. Usually, abdominal pain is not caused by a disease and will improve without treatment. It can often be observed and treated at home. Your health care provider will do a physical exam and possibly order blood tests and X-rays to help determine the seriousness of your pain. However, in many cases, more time must pass before a clear cause of the pain can be found. Before that point, your health care provider may not know if you need more testing or further treatment. HOME CARE INSTRUCTIONS  Monitor your abdominal pain for any changes. The following actions may help to alleviate any discomfort you are experiencing:  Only take over-the-counter or prescription medicines as directed by your health care provider.  Do not take laxatives unless directed to do so by your health care provider.  Try a clear liquid diet (broth, tea, or water) as directed by your health care provider. Slowly move to a bland diet as tolerated. SEEK MEDICAL CARE IF:  You have unexplained abdominal pain.  You have abdominal pain associated with nausea or diarrhea.  You have pain when you urinate or have a bowel movement.  You experience abdominal pain that wakes you in the night.  You have abdominal pain that is worsened or improved by eating food.  You have abdominal pain that is worsened with eating fatty foods.  You have a fever. SEEK IMMEDIATE MEDICAL CARE IF:   Your pain does not go away within 2 hours.  You keep throwing up (vomiting).  Your pain is felt only in portions of the abdomen, such as  the right side or the left lower portion of the abdomen.  You pass bloody or black tarry stools. MAKE SURE YOU:  Understand these instructions.   Will watch your condition.   Will get help right away if you are not doing well or get worse.  Document Released: 03/08/2005 Document Revised: 06/03/2013 Document Reviewed: 02/05/2013 [facility] Patient Information 2015 [place], [place]. This information is not intended to replace advice given to you by your health care provider. Make sure you discuss any questions you have with your health care provider.

## 2014-02-17 NOTE — ED Notes (Signed)
Pt has been vomiting x 2 days and recently diagnosed with ulcers. Pt has taken zofran and phenergan with no relief.

## 2014-02-17 NOTE — Telephone Encounter (Signed)
Letter from: Corbin Ade  Reason for Letter: Results Review  Send letter to patient.  Send copy of letter with path to referring provider and PCP.   Needs hp serologies done now; need ov w extender in 3 months to set up f/u egd

## 2014-02-17 NOTE — ED Provider Notes (Addendum)
CSN: [id number]     Arrival date & time 02/17/14  0030 History  This chart was scribed for Doug Sou, MD by Milly Jakob, ED Scribe. The patient was seen in room APA06/APA06. Patient's care was started at 12:41 AM.   Chief Complaint  Patient presents with  . Vomiting   The history is provided by the patient. No language interpreter was used.   HPI Comments: Nicolas [name] is a 38 y.o. male who presents to the Emergency Department complaining of  Persistent, intermittent vomiting onset 2 days ago. He states that he vomited 20 times today, and reports that his emesis is clear. He reports that he took phenergan prescribed to him by Dr. Gerda Diss 40 minutes ago no minimal releif and that he threw up immediately after. He reports difficulty eating and drinking. He reports associated, moderate, burning, epigastric abdominal pain, radiating to his anterior chest. He reports a normal bowel movement two days ago.  He reports that he was discharged from the hospital 4 days ago, and diagnosed with a stomach ulcer. His denies, drinking, or using any illicit drugs. He reports that he used to drink socially several years ago. Last bowel movement one or 2 days ago, normal. Nothing makes symptoms better or worse. No hematemesis no blood per. No fever. No other associated symptoms  Past Medical History  Diagnosis Date  . Hypertension   . Pancreatitis   . STEMI (ST elevation myocardial infarction) 01/19/14    normal cardiac cath  . Kidney stone     bil , different times  passed stones   Past Surgical History  Procedure Laterality Date  . Cardiac catheterization  Aug 2015    normal EF, normal coronaries  . Esophagogastroduodenoscopy (egd) with propofol N/A 02/11/2014    Procedure: ESOPHAGOGASTRODUODENOSCOPY (EGD) WITH PROPOFOL;  Surgeon: Corbin Ade, MD;  Location: [facility];  Service: Endoscopy;  Laterality: N/A;  . Esophageal biopsy N/A 02/11/2014    Procedure: BIOPSY;  Surgeon: Corbin Ade, MD;   Location: [facility];  Service: Endoscopy;  Laterality: N/A;   Family History  Problem Relation Age of Onset  . Cancer Mother     Breast  . Colon cancer Neg Hx    History  Substance Use Topics  . Smoking status: Current Every Day Smoker -- 1.50 packs/day for 10 years    Types: Cigarettes  . Smokeless tobacco: Never Used  . Alcohol Use: No     Comment: occassionally   vodka and orange juice    Review of Systems  Constitutional: Negative.   HENT: Negative.   Respiratory: Negative.   Cardiovascular: Negative.   Gastrointestinal: Positive for nausea, vomiting and abdominal pain.  Musculoskeletal: Negative.   Skin: Negative.   Neurological: Negative.   Psychiatric/Behavioral: Negative.   All other systems reviewed and are negative.     Allergies  Review of patient's allergies indicates no known allergies.  Home Medications   Prior to Admission medications   Medication Sig Start Date End Date Taking? Authorizing Provider  amLODipine (NORVASC) 10 MG tablet Take 1 tablet (10 mg total) by mouth daily. 02/01/14  Yes Elliot Cousin, MD  HYDROcodone-acetaminophen (NORCO) 7.5-325 MG per tablet Take 2 tablets by mouth every 4 (four) hours as needed for severe pain (pain). 02/08/14  Yes Nishant Dhungel, MD  nicotine (NICODERM CQ - DOSED IN MG/24 HOURS) 21 mg/24hr patch Place 1 patch (21 mg total) onto the skin daily. 02/12/14  Yes Rhetta Mura, MD  nicotine polacrilex (NICORETTE) 2  MG gum Take 1 each (2 mg total) by mouth as needed for smoking cessation. 02/12/14  Yes Rhetta Mura, MD  nystatin cream (MYCOSTATIN) Apply 1 application topically 3 (three) times daily. Apply tid groin area prn 01/23/14  Yes [name], MD  pantoprazole (PROTONIX) 40 MG tablet Take 1 tablet (40 mg total) by mouth 2 (two) times daily before a meal. 02/12/14  Yes Tiffany Kocher, PA-C  promethazine (PHENERGAN) 25 MG tablet Take 1 tablet (25 mg total) by mouth every 8 (eight) hours as needed for nausea or  vomiting. 01/23/14  Yes [name], MD  sucralfate (CARAFATE) 1 G tablet Take 1 tablet (1 g total) by mouth 4 (four) times daily -  with meals and at bedtime. 02/12/14  Yes Rhetta Mura, MD   There were no vitals taken for this visit. Physical Exam  Nursing note and vitals reviewed. Constitutional: He appears well-developed and well-nourished. He appears distressed.  Alert Glasgow Coma Score 15 appears uncomfortable  HENT:  Head: Normocephalic and atraumatic.  Mucus membranes dry  Eyes: Conjunctivae are normal. Pupils are equal, round, and reactive to light.  Neck: Neck supple. No tracheal deviation present. No thyromegaly present.  Cardiovascular: Regular rhythm.   No murmur heard. Mildly tachycardic  Pulmonary/Chest: Effort normal and breath sounds normal.  Abdominal: Soft. Bowel sounds are normal. He exhibits no distension. There is tenderness.  Tender at epigastrium  Genitourinary: Penis normal.  Musculoskeletal: Normal range of motion. He exhibits no edema and no tenderness.  Neurological: He is alert. Coordination normal.  Skin: Skin is warm and dry. No rash noted.  Psychiatric: He has a normal mood and affect.    ED Course  Procedures (including critical care time)  12:46 AM-Discussed treatment plan which includes nausea medication and lab work with pt at bedside and pt agreed to plan.   Labs Review Labs Reviewed  COMPREHENSIVE METABOLIC PANEL  CBC WITH DIFFERENTIAL  LIPASE, BLOOD   X-rays viewed by me Imaging Review No results found.   EKG Interpretation   Date/Time:  Tuesday February 17 2014 00:56:06 EDT Ventricular Rate:  117 PR Interval:  147 QRS Duration: 99 QT Interval:  351 QTC Calculation: 490 R Axis:   8 Text Interpretation:  Sinus tachycardia Anteroseptal infarct, age  indeterminate No significant change since last tracing Confirmed by  Ethelda Chick  MD, Carles Florea [phone]) on 02/17/2014 1:31:33 AM      Results for orders placed during the  hospital encounter of 02/17/14  COMPREHENSIVE METABOLIC PANEL      Result Value Ref Range   Sodium 143  137 - 147 mEq/L   Potassium 3.9  3.7 - 5.3 mEq/L   Chloride 92 (*) 96 - 112 mEq/L   CO2 31  19 - 32 mEq/L   Glucose, Bld 148 (*) 70 - 99 mg/dL   BUN 22  6 - 23 mg/dL   Creatinine, Ser 6.04  0.50 - 1.35 mg/dL   Calcium 54.0 (*) 8.4 - 10.5 mg/dL   Total Protein 8.7 (*) 6.0 - 8.3 g/dL   Albumin 4.4  3.5 - 5.2 g/dL   AST 12  0 - 37 U/L   ALT 25  0 - 53 U/L   Alkaline Phosphatase 156 (*) 39 - 117 U/L   Total Bilirubin 0.5  0.3 - 1.2 mg/dL   GFR calc non Af Amer 84 (*) >90 mL/min   GFR calc Af Amer >90  >90 mL/min   Anion gap 20 (*) 5 -  15  CBC WITH DIFFERENTIAL      Result Value Ref Range   WBC 20.9 (*) 4.0 - 10.5 K/uL   RBC 5.53  4.22 - 5.81 MIL/uL   Hemoglobin 17.7 (*) 13.0 - 17.0 g/dL   HCT 16.1  09.6 - 04.5 %   MCV 87.2  78.0 - 100.0 fL   MCH 32.0  26.0 - 34.0 pg   MCHC 36.7 (*) 30.0 - 36.0 g/dL   RDW 40.9  81.1 - 91.4 %   Platelets 451 (*) 150 - 400 K/uL   Neutrophils Relative % 85 (*) 43 - 77 %   Neutro Abs 17.7 (*) 1.7 - 7.7 K/uL   Lymphocytes Relative 10 (*) 12 - 46 %   Lymphs Abs 2.1  0.7 - 4.0 K/uL   Monocytes Relative 5  3 - 12 %   Monocytes Absolute 1.0  0.1 - 1.0 K/uL   Eosinophils Relative 0  0 - 5 %   Eosinophils Absolute 0.0  0.0 - 0.7 K/uL   Basophils Relative 0  0 - 1 %   Basophils Absolute 0.0  0.0 - 0.1 K/uL  LIPASE, BLOOD      Result Value Ref Range   Lipase 29  11 - 59 U/L  BASIC METABOLIC PANEL      Result Value Ref Range   Sodium 146  137 - 147 mEq/L   Potassium 4.2  3.7 - 5.3 mEq/L   Chloride 103  96 - 112 mEq/L   CO2 30  19 - 32 mEq/L   Glucose, Bld 117 (*) 70 - 99 mg/dL   BUN 21  6 - 23 mg/dL   Creatinine, Ser 7.82  0.50 - 1.35 mg/dL   Calcium 9.3  8.4 - 95.6 mg/dL   GFR calc non Af Amer >90  >90 mL/min   GFR calc Af Amer >90  >90 mL/min   Anion gap 13  5 - 15  CBC      Result Value Ref Range   WBC 18.1 (*) 4.0 - 10.5 K/uL   RBC 4.81   4.22 - 5.81 MIL/uL   Hemoglobin 15.1  13.0 - 17.0 g/dL   HCT 21.3  08.6 - 57.8 %   MCV 89.2  78.0 - 100.0 fL   MCH 31.4  26.0 - 34.0 pg   MCHC 35.2  30.0 - 36.0 g/dL   RDW 46.9  62.9 - 52.8 %   Platelets 387  150 - 400 K/uL   Dg Chest 2 View  01/19/2014   CLINICAL DATA:  Chest pain; recent cardiac arrest  EXAM: CHEST  2 VIEW  COMPARISON:  January 18, 2014  FINDINGS: Patchy atelectasis is noted in the left base region. Elsewhere lungs are clear. Heart is mildly enlarged with pulmonary vascularity within normal limits. No adenopathy. No bone lesions are appreciable. There is degenerative change in the thoracic spine. No pneumothorax.  IMPRESSION: Areas of left lower lobe atelectatic change. Lungs elsewhere clear. No apparent pneumothorax. Stable mild cardiac prominence.   Electronically Signed   By: Bretta Bang M.D.   On: 01/19/2014 07:35   Ct Abdomen Pelvis W Contrast  02/09/2014   CLINICAL DATA:  ABDOMINAL PAIN EMESIS  EXAM: CT ABDOMEN AND PELVIS WITH CONTRAST  TECHNIQUE: Multidetector CT imaging of the abdomen and pelvis was performed using the standard protocol following bolus administration of intravenous contrast.  CONTRAST:  OMNIPAQUE IOHEXOL 300 MG/ML  SOLN  COMPARISON:  01/30/2014  FINDINGS: Visualized lung bases  clear.  Some increase in inflammatory/edematous changes between the gallbladder and pancreatic head, inferior to the pylorus. Wall thickening in the gastric antrum will is suspected. There is a focal outpouching from the gastric lumen along the greater curvature suggesting ulceration. Regional prominent lymph nodes measuring up to 13 mm short axis diameter image 31/2. No focal fluid collection or free air.  Unremarkable liver, spleen, kidneys. Chronic will will Linear coarse calcifications in both adrenal glands as before. There is homogeneous pancreatic parenchymal enhancement without ductal dilatation. Unremarkable aorta and portal vein. Stomach is physiologically distended.  Small bowel and colon are nondilated. Normal appendix. Urinary bladder nondistended. Bilateral pelvic phleboliths. No ascites. Subcentimeter right lower quadrant mesenteric, left para-aortic, aortocaval, and central mesenteric lymph nodes. Lumbar spine unremarkable.  IMPRESSION: 1. Progressive wall thickening in the gastric antrum with focal ulceration suggesting peptic ulcer disease versus ulcerated gastric carcinoma. Recommend endoscopy for further evaluation. 2. Inflammatory/edematous changes inferior to the pylorus with regional adenopathy, likely secondary to the gastric process. No evidence of perforation or abscess.   Electronically Signed   By: Oley Balm M.D.   On: 02/09/2014 20:33   Ct Abdomen Pelvis W Contrast  01/30/2014   CLINICAL DATA:  Upper abdominal pain since undergoing CPR 2 weeks ago.  EXAM: CT ABDOMEN AND PELVIS WITH CONTRAST  TECHNIQUE: Multidetector CT imaging of the abdomen and pelvis was performed using the standard protocol following bolus administration of intravenous contrast.  CONTRAST:  50 mL OMNIPAQUE IOHEXOL 300 MG/ML  SOLN  COMPARISON:  CT abdomen and pelvis 03/03/2012 and 01/10/2010.  FINDINGS: Lung bases are clear.  No pleural or pericardial effusion.  The gallbladder, liver, spleen, pancreas and right kidney are unremarkable. Calcified adrenal glands are consistent with prior infection or hemorrhage. Tiny exophytic lesion off the upper pole left kidney is likely a cyst and unchanged. The stomach, small and large bowel and appendix all appear normal. No fracture or worrisome bony lesion is seen. Small sclerotic lesion in the proximal right femur is unchanged and consistent with a bone island.  IMPRESSION: No acute finding.  Stable compared to prior exam.   Electronically Signed   By: Drusilla Kanner M.D.   On: 01/30/2014 15:26   US Abdomen Limited  01/30/2014   CLINICAL DATA:  Abdominal pain for 2 weeks.  EXAM: US ABDOMEN LIMITED - RIGHT UPPER QUADRANT  COMPARISON:  CT  abdomen and pelvis 03/03/2012.  FINDINGS: Gallbladder:  No gallstones or wall thickening visualized. No sonographic Murphy sign noted.  Common bile duct:  Diameter: 0.3 cm.  Liver:  No focal lesion identified. Within normal limits in parenchymal echogenicity.  IMPRESSION: Negative for gallstones.  Negative examination.   Electronically Signed   By: Drusilla Kanner M.D.   On: 01/30/2014 14:56   Dg Abd Acute W/chest  02/17/2014   CLINICAL DATA:  Epigastric pain and tenderness.  Severe vomiting.  EXAM: ACUTE ABDOMEN SERIES (ABDOMEN 2 VIEW & CHEST 1 VIEW)  COMPARISON:  01/19/2014  FINDINGS: There is no evidence of dilated bowel loops or free intraperitoneal air. No radiopaque calculi or other significant radiographic abnormality is seen. Heart size and mediastinal contours are within normal limits. Both lungs are clear.  IMPRESSION: Negative abdominal radiographs.  No acute cardiopulmonary disease.   Electronically Signed   By: Burman Nieves M.D.   On: 02/17/2014 01:25    1:35 AM feels improved after treatment with intravenous fluids, Reglan and Pepcid.  3:05 AM patient feels much improved and is drinking ice water X-rays  viewed by me  At 5:55 AM patient feels improved and ready to go home. He has a slight amount of epigastric pain after treatment with intravenous morphine, Pepcid , Reglan and intravenous fluids. MDM  Strongly doubt cardiac cause of pain and vomiting. Patient had normal cardiac catheterization August 2015. No EKG changes today Final diagnoses:  None   patient's leukocytosis is trending slightly downwards after treatment. His hemoglobin is slightly lower over initial blood draw however he vehemently denies black stools or blood per rectum. Initial hemoglobin of 17.7 from today's visit likely secondary hemoconcentration and dehydration. Hemoglobin of 15.1 is more in line with his baseline. Hospitalization was offered the patient however he declined. He has an appointment with Dr. Gerda Diss  scheduled for tomorrow at 8:30 AM Plan prescription for Norco, 6 tablets. He is taking his other medications as prescribed. He's done well with Reglan intravenously given here. He's had no nausea since administered Reglan here Pain is likely secondary peptic ulcer disease Diagnosis #1 epigastric pain #2 nausea and vomiting   I personally performed the services described in this documentation, which was scribed in my presence. The recorded information has been reviewed and considered.    Doug Sou, MD 02/17/14 1610  Doug Sou, MD 02/17/14 [phone]

## 2014-02-17 NOTE — Telephone Encounter (Signed)
Letter mailed to pt with lab orders.  Pt is already scheduled for ov with [place].  Please cc pcp.

## 2014-02-17 NOTE — ED Notes (Signed)
Patient here for abdominal pain with nausea and vomiting. Patient recently diagnosed with ulcers. States recent hospital admission for same and recently treated in ED yesterday for same.

## 2014-02-17 NOTE — ED Notes (Signed)
Patient able to retain po fluids with small sips

## 2014-02-18 ENCOUNTER — Ambulatory Visit (INDEPENDENT_AMBULATORY_CARE_PROVIDER_SITE_OTHER): Payer: 59 | Admitting: Family Medicine

## 2014-02-18 ENCOUNTER — Emergency Department (HOSPITAL_COMMUNITY)
Admission: EM | Admit: 2014-02-18 | Discharge: 2014-02-18 | Disposition: A | Discharge status: Home / Self Care | Payer: 59 | Attending: Emergency Medicine | Admitting: Emergency Medicine

## 2014-02-18 ENCOUNTER — Telehealth: Payer: Self-pay | Admitting: Gastroenterology

## 2014-02-18 VITALS — BP 144/90 | Ht 75.0 in | Wt 245.0 lb

## 2014-02-18 DIAGNOSIS — R112 Nausea with vomiting, unspecified: Secondary | ICD-10-CM

## 2014-02-18 DIAGNOSIS — K259 Gastric ulcer, unspecified as acute or chronic, without hemorrhage or perforation: Secondary | ICD-10-CM

## 2014-02-18 DIAGNOSIS — R1013 Epigastric pain: Secondary | ICD-10-CM

## 2014-02-18 DIAGNOSIS — K253 Acute gastric ulcer without hemorrhage or perforation: Secondary | ICD-10-CM

## 2014-02-18 DIAGNOSIS — R109 Unspecified abdominal pain: Secondary | ICD-10-CM

## 2014-02-18 LAB — COMPREHENSIVE METABOLIC PANEL
ALBUMIN: 3.8 g/dL (ref 3.5–5.2)
ALT: 17 U/L (ref 0–53)
AST: 13 U/L (ref 0–37)
Alkaline Phosphatase: 126 U/L — ABNORMAL HIGH (ref 39–117)
Anion gap: 15 (ref 5–15)
BUN: 21 mg/dL (ref 6–23)
CALCIUM: 9.9 mg/dL (ref 8.4–10.5)
CO2: 29 mEq/L (ref 19–32)
Chloride: 98 mEq/L (ref 96–112)
Creatinine, Ser: 0.88 mg/dL (ref 0.50–1.35)
GFR calc Af Amer: >90 mL/min (ref >90)
Glucose, Bld: 105 mg/dL — ABNORMAL HIGH (ref 70–99)
Potassium: 3.3 mEq/L — ABNORMAL LOW (ref 3.7–5.3)
Sodium: 142 mEq/L (ref 137–147)
TOTAL PROTEIN: 7.3 g/dL (ref 6.0–8.3)
Total Bilirubin: 0.5 mg/dL (ref 0.3–1.2)

## 2014-02-18 LAB — CBC WITH DIFFERENTIAL/PLATELET
BASOS ABS: 0 10*3/uL (ref 0.0–0.1)
BASOS PCT: 0 % (ref 0–1)
EOS PCT: 0 % (ref 0–5)
Eosinophils Absolute: 0.1 10*3/uL (ref 0.0–0.7)
HCT: 43.4 % (ref 39.0–52.0)
Hemoglobin: 15.7 g/dL (ref 13.0–17.0)
Lymphocytes Relative: 11 % — ABNORMAL LOW (ref 12–46)
Lymphs Abs: 2.1 10*3/uL (ref 0.7–4.0)
MCH: 31.5 pg (ref 26.0–34.0)
MCHC: 36.2 g/dL — ABNORMAL HIGH (ref 30.0–36.0)
MCV: 87.1 fL (ref 78.0–100.0)
Monocytes Absolute: 1.1 10*3/uL — ABNORMAL HIGH (ref 0.1–1.0)
Monocytes Relative: 6 % (ref 3–12)
NEUTROS ABS: 16.1 10*3/uL — AB (ref 1.7–7.7)
Neutrophils Relative %: 83 % — ABNORMAL HIGH (ref 43–77)
PLATELETS: 399 10*3/uL (ref 150–400)
RBC: 4.98 MIL/uL (ref 4.22–5.81)
RDW: 13.4 % (ref 11.5–15.5)
WBC: 19.2 10*3/uL — ABNORMAL HIGH (ref 4.0–10.5)

## 2014-02-18 LAB — LACTIC ACID, PLASMA: LACTIC ACID, VENOUS: 1.4 mmol/L (ref 0.5–2.2)

## 2014-02-18 LAB — LIPASE, BLOOD: LIPASE: 18 U/L (ref 11–59)

## 2014-02-18 MED ORDER — METOCLOPRAMIDE HCL 5 MG/ML IJ SOLN
10.0000 mg | Freq: Once | INTRAMUSCULAR | Status: AC
  Administered 2014-02-18: 10 mg via INTRAVENOUS
  Filled 2014-02-18: qty 2

## 2014-02-18 MED ORDER — PANTOPRAZOLE SODIUM 40 MG IV SOLR
40.0000 mg | Freq: Once | INTRAVENOUS | Status: AC
  Administered 2014-02-18: 40 mg via INTRAVENOUS
  Filled 2014-02-18: qty 40

## 2014-02-18 MED ORDER — PROMETHAZINE HCL 25 MG PO TABS: 25.0000 mg | ORAL_TABLET | Freq: Three times a day (TID) | ORAL | Status: DC | PRN

## 2014-02-18 MED ORDER — PROMETHAZINE HCL 25 MG RE SUPP: 25.0000 mg | Freq: Four times a day (QID) | RECTAL | Status: DC | PRN

## 2014-02-18 MED ORDER — ONDANSETRON HCL 4 MG/2ML IJ SOLN
4.0000 mg | Freq: Once | INTRAMUSCULAR | Status: AC
  Administered 2014-02-18: 4 mg via INTRAVENOUS
  Filled 2014-02-18: qty 2

## 2014-02-18 MED ORDER — DIPHENHYDRAMINE HCL 50 MG/ML IJ SOLN
25.0000 mg | Freq: Once | INTRAMUSCULAR | Status: AC
  Administered 2014-02-18: 25 mg via INTRAVENOUS
  Filled 2014-02-18: qty 1

## 2014-02-18 MED ORDER — HYDROMORPHONE HCL PF 1 MG/ML IJ SOLN
1.0000 mg | Freq: Once | INTRAMUSCULAR | Status: AC
  Administered 2014-02-18: 1 mg via INTRAVENOUS
  Filled 2014-02-18: qty 1

## 2014-02-18 MED ORDER — SODIUM CHLORIDE 0.9 % IV SOLN
1000.0000 mL | INTRAVENOUS | Status: DC
Start: 2014-02-18 — End: 2014-02-18
  Administered 2014-02-18: 1000 mL via INTRAVENOUS

## 2014-02-18 MED ORDER — SODIUM CHLORIDE 0.9 % IV SOLN
1000.0000 mL | Freq: Once | INTRAVENOUS | Status: AC
  Administered 2014-02-18: 1000 mL via INTRAVENOUS

## 2014-02-18 MED ORDER — SODIUM CHLORIDE 0.9 % IV SOLN
1000.0000 mL | Freq: Once | INTRAVENOUS | Status: AC
  Administered 2014-02-18: 2000 mL via INTRAVENOUS

## 2014-02-18 MED ORDER — SODIUM CHLORIDE 0.9 % IV SOLN
80.0000 mg | Freq: Once | INTRAVENOUS | Status: AC
  Administered 2014-02-18: 80 mg via INTRAVENOUS
  Filled 2014-02-18: qty 80

## 2014-02-18 MED ORDER — PANTOPRAZOLE SODIUM 40 MG IV SOLR
INTRAVENOUS | Status: AC
  Filled 2014-02-18: qty 80

## 2014-02-18 NOTE — Telephone Encounter (Signed)
Results cc'ed to pcp °

## 2014-02-18 NOTE — ED Provider Notes (Signed)
CSN: [id number]     Arrival date & time 02/17/14  2203 History  This chart was scribed for Nicolas Booze, MD by Bronson Curb, ED Scribe. This patient was seen in room APA01/APA01 and the patient's care was started at 12:40 AM.    Chief Complaint  Patient presents with  . Abdominal Pain     The history is provided by the patient. No language interpreter was used.    HPI Comments: Nicolas [name] is a 38 y.o. male, with history of pancreatitis, who presents to the Emergency Department complaining of periumbilical and epigastric abdominal pain that began tonight. Patient was recently diagnosed with a stomach ulcer (5 days ago). Patient was seen here 24 hours ago for the same and states his symptoms returned after being discharged. He rates his pain as 10/10, currently. There is associated nausea and vomiting. Patient reports some relief with emesis. He reports history of prior episodes 1 month ago, that was believed to be related to his pancreatitis. He denies fever, chills, diaphoresis, constipation, bloody stools, or coffee ground emesis. Patient states he has been unable to get his prescription of Reglan filled. Patient denies history of abdominal problems prior to this episode. Patient is current 1.5 PPD smoker, but denies history EtOH consumption or any illicit drug use   Past Medical History  Diagnosis Date  . Hypertension   . Pancreatitis   . STEMI (ST elevation myocardial infarction) 01/19/14    normal cardiac cath  . Kidney stone     bil , different times  passed stones   Past Surgical History  Procedure Laterality Date  . Cardiac catheterization  Aug 2015    normal EF, normal coronaries  . Esophagogastroduodenoscopy (egd) with propofol N/A 02/11/2014    Procedure: ESOPHAGOGASTRODUODENOSCOPY (EGD) WITH PROPOFOL;  Surgeon: Corbin Ade, MD;  Location: [facility];  Service: Endoscopy;  Laterality: N/A;  . Esophageal biopsy N/A 02/11/2014    Procedure: BIOPSY;  Surgeon: Corbin Ade,  MD;  Location: [facility];  Service: Endoscopy;  Laterality: N/A;   Family History  Problem Relation Age of Onset  . Cancer Mother     Breast  . Colon cancer Neg Hx    History  Substance Use Topics  . Smoking status: Current Every Day Smoker -- 1.50 packs/day for 10 years    Types: Cigarettes  . Smokeless tobacco: Never Used  . Alcohol Use: No     Comment: occassionally   vodka and orange juice    Review of Systems  Constitutional: Negative for fever, chills and diaphoresis.  Gastrointestinal: Positive for nausea, vomiting and abdominal pain. Negative for constipation and blood in stool.  All other systems reviewed and are negative.     Allergies  Review of patient's allergies indicates no known allergies.  Home Medications   Prior to Admission medications   Medication Sig Start Date End Date Taking? Authorizing Provider  amLODipine (NORVASC) 10 MG tablet Take 1 tablet (10 mg total) by mouth daily. 02/01/14   Elliot Cousin, MD  HYDROcodone-acetaminophen (NORCO) 5-325 MG per tablet Take 1 tablet by mouth every 4 (four) hours as needed for severe pain. 02/17/14   Doug Sou, MD  HYDROcodone-acetaminophen (NORCO) 7.5-325 MG per tablet Take 2 tablets by mouth every 4 (four) hours as needed for severe pain (pain). 02/08/14   Nishant Dhungel, MD  metoCLOPramide (REGLAN) 10 MG tablet Take 1 tablet (10 mg total) by mouth every 6 (six) hours. 02/17/14   Doug Sou, MD  nicotine (NICODERM  CQ - DOSED IN MG/24 HOURS) 21 mg/24hr patch Place 1 patch (21 mg total) onto the skin daily. 02/12/14   Rhetta Mura, MD  nicotine polacrilex (NICORETTE) 2 MG gum Take 1 each (2 mg total) by mouth as needed for smoking cessation. 02/12/14   Rhetta Mura, MD  nystatin cream (MYCOSTATIN) Apply 1 application topically 3 (three) times daily. Apply tid groin area prn 01/23/14   [name], MD  pantoprazole (PROTONIX) 40 MG tablet Take 1 tablet (40 mg total) by mouth 2 (two) times daily before a  meal. 02/12/14   Tiffany Kocher, PA-C  promethazine (PHENERGAN) 25 MG tablet Take 1 tablet (25 mg total) by mouth every 8 (eight) hours as needed for nausea or vomiting. 01/23/14   [name], MD  sucralfate (CARAFATE) 1 G tablet Take 1 tablet (1 g total) by mouth 4 (four) times daily -  with meals and at bedtime. 02/12/14   Rhetta Mura, MD   Triage Vitals: BP 168/95  Pulse 117  Temp(Src) 98.8 F (37.1 C) (Oral)  Resp 20  Ht 6\' 3"  (1.905 m)  Wt 230 lb (104.327 kg)  BMI 28.75 kg/m2  SpO2 100%  Physical Exam  Nursing note and vitals reviewed. Constitutional: He is oriented to person, place, and time. He appears well-developed and well-nourished.  uncomfortable appearing.  HENT:  Head: Normocephalic and atraumatic.  Eyes: Conjunctivae and EOM are normal. Pupils are equal, round, and reactive to light. No scleral icterus.  Neck: Normal range of motion. Neck supple. No JVD present.  Cardiovascular: Regular rhythm and normal heart sounds.  Tachycardia present.   No murmur heard. Pulmonary/Chest: Effort normal and breath sounds normal. He has no wheezes. He has no rales.  Abdominal: Soft. He exhibits no distension and no mass. There is tenderness in the epigastric area. There is no rebound and no guarding.  Marked tenderness to epigastric area. No rebound or guarding,. Bowel sounds decreased.  Musculoskeletal: Normal range of motion. He exhibits no edema.  Lymphadenopathy:    He has no cervical adenopathy.  Neurological: He is alert and oriented to person, place, and time. He has normal reflexes. No cranial nerve deficit. Coordination normal.  Skin: Skin is warm and dry. No rash noted.  Psychiatric: He has a normal mood and affect. His behavior is normal. Thought content normal.    ED Course  Procedures (including critical care time)  DIAGNOSTIC STUDIES: Oxygen Saturation is 100% on room air, normal by my interpretation.    COORDINATION OF CARE: At 0045 Discussed treatment  plan with patient which includes labs, Dilaudid, and Reglan. Patient agrees.   Labs Review Results for orders placed during the hospital encounter of 02/18/14  CBC WITH DIFFERENTIAL      Result Value Ref Range   WBC 19.2 (*) 4.0 - 10.5 K/uL   RBC 4.98  4.22 - 5.81 MIL/uL   Hemoglobin 15.7  13.0 - 17.0 g/dL   HCT 79.4  80.1 - 65.5 %   MCV 87.1  78.0 - 100.0 fL   MCH 31.5  26.0 - 34.0 pg   MCHC 36.2 (*) 30.0 - 36.0 g/dL   RDW 37.4  82.7 - 07.8 %   Platelets 399  150 - 400 K/uL   Neutrophils Relative % 83 (*) 43 - 77 %   Neutro Abs 16.1 (*) 1.7 - 7.7 K/uL   Lymphocytes Relative 11 (*) 12 - 46 %   Lymphs Abs 2.1  0.7 - 4.0 K/uL  Monocytes Relative 6  3 - 12 %   Monocytes Absolute 1.1 (*) 0.1 - 1.0 K/uL   Eosinophils Relative 0  0 - 5 %   Eosinophils Absolute 0.1  0.0 - 0.7 K/uL   Basophils Relative 0  0 - 1 %   Basophils Absolute 0.0  0.0 - 0.1 K/uL  COMPREHENSIVE METABOLIC PANEL      Result Value Ref Range   Sodium 142  137 - 147 mEq/L   Potassium 3.3 (*) 3.7 - 5.3 mEq/L   Chloride 98  96 - 112 mEq/L   CO2 29  19 - 32 mEq/L   Glucose, Bld 105 (*) 70 - 99 mg/dL   BUN 21  6 - 23 mg/dL   Creatinine, Ser 1.61  0.50 - 1.35 mg/dL   Calcium 9.9  8.4 - 09.6 mg/dL   Total Protein 7.3  6.0 - 8.3 g/dL   Albumin 3.8  3.5 - 5.2 g/dL   AST 13  0 - 37 U/L   ALT 17  0 - 53 U/L   Alkaline Phosphatase 126 (*) 39 - 117 U/L   Total Bilirubin 0.5  0.3 - 1.2 mg/dL   GFR calc non Af Amer >90  >90 mL/min   GFR calc Af Amer >90  >90 mL/min   Anion gap 15  5 - 15  LIPASE, BLOOD      Result Value Ref Range   Lipase 18  11 - 59 U/L  LACTIC ACID, PLASMA      Result Value Ref Range   Lactic Acid, Venous 1.4  0.5 - 2.2 mmol/L   MDM   Final diagnoses:  Gastric ulcer, unspecified chronicity  Epigastric pain  Non-intractable vomiting with nausea, vomiting of unspecified type    Abdominal pain with vomiting. Her records are reviewed and he was recently discharged from [facility]. He EGD  during admission showed a gastric ulcer. Biopsies taken at that time showed no evidence of malignancy. Had been seen in the ED yesterday with recurrent pain. He is given IV hydration, IV pantoprazole, IV hydromorphone, and IV metoclopramide with improvement in nausea but no improvement in pain. He is given 2 additional doses of hydromorphone with moderate relief of pain in nausea started to come back. He was given an additional dose of hydromorphone and a dose of antacids with adequate resolution of symptoms. He had been offered hospital admission when the pain was adequately controlled after 3 doses of hydromorphone but he stated he wished to go home. He is noted to have leukocytosis but it is not significantly changed from recent values. Hemoglobin is stable and metabolic panel is unremarkable as are lipase and lactic acid. At this point, he is resting comfortably and in no distress whatsoever. He states he has an appointment with his PCP at 8:30 today. A major issue seemed to be inability to hold medications down because of vomiting so he is given a prescription for promethazine suppository and he is to resume his medication which was prescribed the following his hospital stay.  I personally performed the services described in this documentation, which was scribed in my presence. The recorded information has been reviewed and is accurate.     Nicolas Booze, MD 02/18/14 [phone]

## 2014-02-18 NOTE — Telephone Encounter (Signed)
MADE APPT 02/23/14 AT 11  LEFT MESSAGE ON HIS PHONE

## 2014-02-18 NOTE — Telephone Encounter (Signed)
Spoke with Dr. Gerda Diss; patient needs to be seen by [place] in the next 2 weeks due to repeated ED visits. May put in an urgent visit if needed.

## 2014-02-18 NOTE — Progress Notes (Signed)
   Subjective:    Patient ID: Nicolas [name], male    DOB: 08-May-1976, 38 y.o.   MRN: [id number]  HPI Patient is here today for a 4 week follow up visit. Patient has no concerns.  We discussed the importance of a bland diet small amounts of food frequently we also discussed the importance of staying away from smoking Review of Systems Relates epigastric pain discomfort denies diarrhea does relate nausea vomiting    Objective:   Physical Exam  Lungs are clear heart regular abdomen soft mild epigastric discomfort  Probable having some of the vomiting related to where the ulcer is in the swelling probably causing some delay in gastric emptying and    Assessment & Plan:  Abdominal pain patient does need sooner followup with gastroenterology. I spoke with gastroenterology they will be seeing him soon. He is to continue his current medications volume diet clear liquids Phenergan as needed followup here GI or hospital if worse

## 2014-02-18 NOTE — Discharge Instructions (Signed)
Peptic Ulcer A peptic ulcer is a sore in the lining of your esophagus (esophageal ulcer), stomach (gastric ulcer), or in the first part of your small intestine (duodenal ulcer). The ulcer causes erosion into the deeper tissue. CAUSES  Normally, the lining of the stomach and the small intestine protects itself from the acid that digests food. The protective lining can be damaged by:  An infection caused by a bacterium called Helicobacter pylori (H. pylori).  Regular use of nonsteroidal anti-inflammatory drugs (NSAIDs), such as ibuprofen or aspirin.  Smoking tobacco. Other risk factors include being older than 50, drinking alcohol excessively, and having a family history of ulcer disease.  SYMPTOMS   Burning pain or gnawing in the area between the chest and the belly button.  Heartburn.  Nausea and vomiting.  Bloating. The pain can be worse on an empty stomach and at night. If the ulcer results in bleeding, it can cause:  Black, tarry stools.  Vomiting of bright red blood.  Vomiting of coffee-ground-looking materials. DIAGNOSIS  A diagnosis is usually made based upon your history and an exam. Other tests and procedures may be performed to find the cause of the ulcer. Finding a cause will help determine the best treatment. Tests and procedures may include:  Blood tests, stool tests, or breath tests to check for the bacterium H. pylori.  An upper gastrointestinal (GI) series of the esophagus, stomach, and small intestine.  An endoscopy to examine the esophagus, stomach, and small intestine.  A biopsy. TREATMENT  Treatment may include:  Eliminating the cause of the ulcer, such as smoking, NSAIDs, or alcohol.  Medicines to reduce the amount of acid in your digestive tract.  Antibiotic medicines if the ulcer is caused by the H. pylori bacterium.  An upper endoscopy to treat a bleeding ulcer.  Surgery if the bleeding is severe or if the ulcer created a hole somewhere in the  digestive system. HOME CARE INSTRUCTIONS   Avoid tobacco, alcohol, and caffeine. Smoking can increase the acid in the stomach, and continued smoking will impair the healing of ulcers.  Avoid foods and drinks that seem to cause discomfort or aggravate your ulcer.  Only take medicines as directed by your caregiver. Do not substitute over-the-counter medicines for prescription medicines without talking to your caregiver.  Keep any follow-up appointments and tests as directed. SEEK MEDICAL CARE IF:   Your do not improve within 7 days of starting treatment.  You have ongoing indigestion or heartburn. SEEK IMMEDIATE MEDICAL CARE IF:   You have sudden, sharp, or persistent abdominal pain.  You have bloody or dark black, tarry stools.  You vomit blood or vomit that looks like coffee grounds.  You become light-headed, weak, or feel faint.  You become sweaty or clammy. MAKE SURE YOU:   Understand these instructions.  Will watch your condition.  Will get help right away if you are not doing well or get worse. Document Released: 05/26/2000 Document Revised: 10/13/2013 Document Reviewed: 12/27/2011 [facility] Patient Information 2015 [place], [place]. This information is not intended to replace advice given to you by your health care provider. Make sure you discuss any questions you have with your health care provider.  Nausea and Vomiting Nausea is a sick feeling that often comes before throwing up (vomiting). Vomiting is a reflex where stomach contents come out of your mouth. Vomiting can cause severe loss of body fluids (dehydration). Children and elderly adults can become dehydrated quickly, especially if they also have diarrhea. Nausea and vomiting  are symptoms of a condition or disease. It is important to find the cause of your symptoms. CAUSES   Direct irritation of the stomach lining. This irritation can result from increased acid production (gastroesophageal reflux disease),  infection, food poisoning, taking certain medicines (such as nonsteroidal anti-inflammatory drugs), alcohol use, or tobacco use.  Signals from the brain.These signals could be caused by a headache, heat exposure, an inner ear disturbance, increased pressure in the brain from injury, infection, a tumor, or a concussion, pain, emotional stimulus, or metabolic problems.  An obstruction in the gastrointestinal tract (bowel obstruction).  Illnesses such as diabetes, hepatitis, gallbladder problems, appendicitis, kidney problems, cancer, sepsis, atypical symptoms of a heart attack, or eating disorders.  Medical treatments such as chemotherapy and radiation.  Receiving medicine that makes you sleep (general anesthetic) during surgery. DIAGNOSIS Your caregiver may ask for tests to be done if the problems do not improve after a few days. Tests may also be done if symptoms are severe or if the reason for the nausea and vomiting is not clear. Tests may include:  Urine tests.  Blood tests.  Stool tests.  Cultures (to look for evidence of infection).  X-rays or other imaging studies. Test results can help your caregiver make decisions about treatment or the need for additional tests. TREATMENT You need to stay well hydrated. Drink frequently but in small amounts.You may wish to drink water, sports drinks, clear broth, or eat frozen ice pops or gelatin dessert to help stay hydrated.When you eat, eating slowly may help prevent nausea.There are also some antinausea medicines that may help prevent nausea. HOME CARE INSTRUCTIONS   Take all medicine as directed by your caregiver.  If you do not have an appetite, do not force yourself to eat. However, you must continue to drink fluids.  If you have an appetite, eat a normal diet unless your caregiver tells you differently.  Eat a variety of complex carbohydrates (rice, wheat, potatoes, bread), lean meats, yogurt, fruits, and vegetables.  Avoid  high-fat foods because they are more difficult to digest.  Drink enough water and fluids to keep your urine clear or pale yellow.  If you are dehydrated, ask your caregiver for specific rehydration instructions. Signs of dehydration may include:  Severe thirst.  Dry lips and mouth.  Dizziness.  Dark urine.  Decreasing urine frequency and amount.  Confusion.  Rapid breathing or pulse. SEEK IMMEDIATE MEDICAL CARE IF:   You have blood or brown flecks (like coffee grounds) in your vomit.  You have black or bloody stools.  You have a severe headache or stiff neck.  You are confused.  You have severe abdominal pain.  You have chest pain or trouble breathing.  You do not urinate at least once every 8 hours.  You develop cold or clammy skin.  You continue to vomit for longer than 24 to 48 hours.  You have a fever. MAKE SURE YOU:   Understand these instructions.  Will watch your condition.  Will get help right away if you are not doing well or get worse. Document Released: 05/29/2005 Document Revised: 08/21/2011 Document Reviewed: 10/26/2010 [facility] Patient Information 2015 [place], [place]. This information is not intended to replace advice given to you by your health care provider. Make sure you discuss any questions you have with your health care provider.  Promethazine suppositories What is this medicine? PROMETHAZINE (proe METH a zeen) is an antihistamine. It is used to treat allergic reactions and to treat or prevent nausea  and vomiting from illness or motion sickness. It is also used to make you sleep before surgery, and to help treat pain or nausea after surgery. This medicine may be used for other purposes; ask your health care provider or pharmacist if you have questions. COMMON BRAND NAME(S): Phenadoz, Phenergan, Promethegan What should I tell my health care provider before I take this medicine? They need to know if you have any of these  conditions: -glaucoma -high blood pressure or heart disease -kidney disease -liver disease -lung or breathing disease, like asthma -prostate trouble -pain or difficulty passing urine -seizures -an unusual or allergic reaction to promethazine or phenothiazines, other medicines, foods, dyes, or preservatives -pregnant or trying to get pregnant -breast-feeding How should I use this medicine? This medicine is for rectal use only. Do not take by mouth. Wash your hands before and after use. Take off the foil wrapping. Wet the tip of the suppository with cold tap water to make it easier to use. Lie on your side with your lower leg straightened out and your upper leg bent forward toward your stomach. Lift upper buttock to expose the rectal area. Apply gentle pressure to insert the suppository completely into the rectum, pointed end first. Hold buttocks together for a few seconds. Remain lying down for about 15 minutes to avoid having the suppository come out. Do not use more often than directed. Talk to your pediatrician regarding the use of this medicine in children. Special care may be needed. This medicine should not be given to infants and children younger than 63 years old. Overdosage: If you think you have taken too much of this medicine contact a poison control center or emergency room at once. NOTE: This medicine is only for you. Do not share this medicine with others. What if I miss a dose? If you miss a dose, use it as soon as you can. If it is almost time for your next dose, use only that dose. Do not use double doses. What may interact with this medicine? Do not take this medicine with any of the following medications: -cisapride -dofetilide -dronedarone -MAOIs like Carbex, Eldepryl, Marplan, Nardil, Parnate -pimozide -quinidine, including dextromethorphan; quinidine -thioridazine -ziprasidone This medicine may also interact with the following medications: -certain medicines for  depression, anxiety, or psychotic disturbances -certain medicines for anxiety or sleep -certain medicines for seizures like carbamazepine, phenobarbital, phenytoin -certain medicines for movement abnormalities as in Parkinson's disease, or for gastrointestinal problems -epinephrine -medicines for allergies or colds -muscle relaxants -narcotic medicines for pain -other medicines that prolong the QT interval (cause an abnormal heart rhythm) -tramadol -trimethobenzamide This list may not describe all possible interactions. Give your health care provider a list of all the medicines, herbs, non-prescription drugs, or dietary supplements you use. Also tell them if you smoke, drink alcohol, or use illegal drugs. Some items may interact with your medicine. What should I watch for while using this medicine? Tell your doctor or health care professional if your symptoms do not start to get better in 1 to 2 days. You may get drowsy or dizzy. Do not drive, use machinery, or do anything that needs mental alertness until you know how this medicine affects you. To reduce the risk of dizzy or fainting spells, do not stand or sit up quickly, especially if you are an older patient. Alcohol may increase dizziness and drowsiness. Avoid alcoholic drinks. Your mouth may get dry. Chewing sugarless gum or sucking hard candy, and drinking plenty of water may help.  Contact your doctor if the problem does not go away or is severe. This medicine may cause dry eyes and blurred vision. If you wear contact lenses you may feel some discomfort. Lubricating drops may help. See your eye doctor if the problem does not go away or is severe. This medicine can make you more sensitive to the sun. Keep out of the sun. If you cannot avoid being in the sun, wear protective clothing and use sunscreen. Do not use sun lamps or tanning beds/booths. If you are diabetic, check your blood-sugar levels regularly. What side effects may I notice from  receiving this medicine? Side effects that you should report to your doctor or health care professional as soon as possible: -blurred vision -irregular heartbeat, palpitations or chest pain -muscle or facial twitches -pain or difficulty passing urine -seizures -skin rash -slowed or shallow breathing -unusual bleeding or bruising -yellowing of the eyes or skin Side effects that usually do not require medical attention (report to your doctor or health care professional if they continue or are bothersome): -headache -nightmares, agitation, nervousness, excitability, not able to sleep (these are more likely in children) -stuffy nose This list may not describe all possible side effects. Call your doctor for medical advice about side effects. You may report side effects to FDA at 1-800-FDA-1088. Where should I keep my medicine? Keep out of the reach of children. Store in a refrigerator between 2 and 8 degrees C (36 and 46 degrees F). Throw away any unused medicine after the expiration date. NOTE: This sheet is a summary. It may not cover all possible information. If you have questions about this medicine, talk to your doctor, pharmacist, or health care provider.  2015, [employer]. (2013-01-29 15:57:29)

## 2014-02-23 ENCOUNTER — Encounter: Payer: Self-pay | Admitting: Gastroenterology

## 2014-02-23 ENCOUNTER — Ambulatory Visit: Payer: 59 | Admitting: Gastroenterology

## 2014-03-04 ENCOUNTER — Encounter (HOSPITAL_COMMUNITY): Payer: Self-pay | Admitting: Emergency Medicine

## 2014-03-04 ENCOUNTER — Emergency Department (HOSPITAL_COMMUNITY)
Admission: EM | Admit: 2014-03-04 | Discharge: 2014-03-05 | Disposition: A | Discharge status: Home / Self Care | Payer: 59 | Attending: Emergency Medicine | Admitting: Emergency Medicine

## 2014-03-04 DIAGNOSIS — F17200 Nicotine dependence, unspecified, uncomplicated: Secondary | ICD-10-CM | POA: Diagnosis not present

## 2014-03-04 DIAGNOSIS — R112 Nausea with vomiting, unspecified: Secondary | ICD-10-CM | POA: Insufficient documentation

## 2014-03-04 DIAGNOSIS — I10 Essential (primary) hypertension: Secondary | ICD-10-CM | POA: Insufficient documentation

## 2014-03-04 DIAGNOSIS — Z8719 Personal history of other diseases of the digestive system: Secondary | ICD-10-CM | POA: Diagnosis not present

## 2014-03-04 DIAGNOSIS — I252 Old myocardial infarction: Secondary | ICD-10-CM | POA: Diagnosis not present

## 2014-03-04 DIAGNOSIS — R0602 Shortness of breath: Secondary | ICD-10-CM | POA: Insufficient documentation

## 2014-03-04 DIAGNOSIS — R1013 Epigastric pain: Secondary | ICD-10-CM | POA: Diagnosis not present

## 2014-03-04 DIAGNOSIS — Z87442 Personal history of urinary calculi: Secondary | ICD-10-CM | POA: Insufficient documentation

## 2014-03-04 DIAGNOSIS — Z79899 Other long term (current) drug therapy: Secondary | ICD-10-CM | POA: Diagnosis not present

## 2014-03-04 DIAGNOSIS — R109 Unspecified abdominal pain: Secondary | ICD-10-CM | POA: Diagnosis present

## 2014-03-04 DIAGNOSIS — Z9889 Other specified postprocedural states: Secondary | ICD-10-CM | POA: Insufficient documentation

## 2014-03-04 LAB — CBC WITH DIFFERENTIAL/PLATELET
Basophils Absolute: 0 10*3/uL (ref 0.0–0.1)
Basophils Relative: 0 % (ref 0–1)
Eosinophils Absolute: 0.1 10*3/uL (ref 0.0–0.7)
Eosinophils Relative: 0 % (ref 0–5)
HCT: 44.6 % (ref 39.0–52.0)
HEMOGLOBIN: 15.8 g/dL (ref 13.0–17.0)
LYMPHS ABS: 1.9 10*3/uL (ref 0.7–4.0)
Lymphocytes Relative: 14 % (ref 12–46)
MCH: 31.2 pg (ref 26.0–34.0)
MCHC: 35.4 g/dL (ref 30.0–36.0)
MCV: 88 fL (ref 78.0–100.0)
Monocytes Absolute: 0.9 10*3/uL (ref 0.1–1.0)
Monocytes Relative: 7 % (ref 3–12)
NEUTROS ABS: 10.2 10*3/uL — AB (ref 1.7–7.7)
NEUTROS PCT: 79 % — AB (ref 43–77)
Platelets: 373 10*3/uL (ref 150–400)
RBC: 5.07 MIL/uL (ref 4.22–5.81)
RDW: 14.7 % (ref 11.5–15.5)
WBC: 13.1 10*3/uL — AB (ref 4.0–10.5)

## 2014-03-04 NOTE — ED Notes (Signed)
Pt c/o vomiting and abd pain. Pt has been seen here for the same.

## 2014-03-05 ENCOUNTER — Telehealth: Payer: Self-pay | Admitting: Family Medicine

## 2014-03-05 LAB — COMPREHENSIVE METABOLIC PANEL
ALBUMIN: 4.1 g/dL (ref 3.5–5.2)
ALK PHOS: 144 U/L — AB (ref 39–117)
ALT: 19 U/L (ref 0–53)
ANION GAP: 16 — AB (ref 5–15)
AST: 10 U/L (ref 0–37)
BILIRUBIN TOTAL: 0.6 mg/dL (ref 0.3–1.2)
BUN: 15 mg/dL (ref 6–23)
CHLORIDE: 98 meq/L (ref 96–112)
CO2: 28 mEq/L (ref 19–32)
Calcium: 9.9 mg/dL (ref 8.4–10.5)
Creatinine, Ser: 0.82 mg/dL (ref 0.50–1.35)
GFR calc Af Amer: >90 mL/min (ref >90)
Glucose, Bld: 122 mg/dL — ABNORMAL HIGH (ref 70–99)
POTASSIUM: 3.6 meq/L — AB (ref 3.7–5.3)
Sodium: 142 mEq/L (ref 137–147)
Total Protein: 7.7 g/dL (ref 6.0–8.3)

## 2014-03-05 LAB — LIPASE, BLOOD: Lipase: 27 U/L (ref 11–59)

## 2014-03-05 MED ORDER — HYDROCODONE-ACETAMINOPHEN 5-325 MG PO TABS: 1.0000 | ORAL_TABLET | ORAL | Status: DC | PRN

## 2014-03-05 MED ORDER — HYDROCODONE-ACETAMINOPHEN 5-325 MG PO TABS
2.0000 | ORAL_TABLET | Freq: Once | ORAL | Status: AC
  Administered 2014-03-05: 2 via ORAL
  Filled 2014-03-05: qty 2

## 2014-03-05 MED ORDER — HYDROMORPHONE HCL 1 MG/ML IJ SOLN
1.0000 mg | Freq: Once | INTRAMUSCULAR | Status: AC
  Administered 2014-03-05: 1 mg via INTRAVENOUS
  Filled 2014-03-05: qty 1

## 2014-03-05 MED ORDER — ONDANSETRON HCL 4 MG/2ML IJ SOLN
4.0000 mg | Freq: Once | INTRAMUSCULAR | Status: AC
  Administered 2014-03-05: 4 mg via INTRAVENOUS
  Filled 2014-03-05: qty 2

## 2014-03-05 MED ORDER — SUCRALFATE 1 G PO TABS: 1.0000 g | ORAL_TABLET | Freq: Three times a day (TID) | ORAL | Status: DC

## 2014-03-05 MED ORDER — SUCRALFATE 1 G PO TABS
ORAL_TABLET | ORAL | Status: AC
  Filled 2014-03-05: qty 1

## 2014-03-05 MED ORDER — POTASSIUM CHLORIDE CRYS ER 20 MEQ PO TBCR
40.0000 meq | EXTENDED_RELEASE_TABLET | Freq: Once | ORAL | Status: AC
  Administered 2014-03-05: 40 meq via ORAL
  Filled 2014-03-05: qty 2

## 2014-03-05 MED ORDER — SUCRALFATE 1 G PO TABS
1.0000 g | ORAL_TABLET | Freq: Three times a day (TID) | ORAL | Status: DC
  Administered 2014-03-05: 1 g via ORAL
  Filled 2014-03-05 (×6): qty 1

## 2014-03-05 NOTE — Telephone Encounter (Signed)
sucralfate (CARAFATE) 1 G tablet  Pt was taking this 3xmeals & bedtime  Pt was prescribed this at the ER, ran out an ended up in the  ER again last night. Tried to refill thru the Pharmacy but they said  The new script would have to come from his PCP   Can we send this into [facility]

## 2014-03-05 NOTE — Discharge Instructions (Signed)
Your labs are non-acute tonight. Please use carafate with meals and at bed time. Please see Dr Gerda Diss follow up and medication in the office.

## 2014-03-05 NOTE — ED Provider Notes (Signed)
Medical screening examination/treatment/procedure(s) were performed by non-physician practitioner and as supervising physician I was immediately available for consultation/collaboration.   EKG Interpretation None       Juliet Rude. Rubin Payor, MD 03/05/14 [phone]

## 2014-03-05 NOTE — ED Provider Notes (Signed)
CSN: [id number]     Arrival date & time 03/04/14  2218 History   First MD Initiated Contact with Patient 03/04/14 2317     Chief Complaint  Patient presents with  . Abdominal Pain  . Emesis     (Consider location/radiation/quality/duration/timing/severity/associated sxs/prior Treatment) HPI Comments: Patient is a 38 year-old male who presents to the emergency department with complaint of abdominal pain and vomiting. The patient states that this episode of pain started on Tuesday, September 22. The patient states that he was in the emergency department on September 8 at which time he was placed on pain medicine and antibiotic medics. The patient states he is out of the antibiotic medicine and he has felt bad since that time. He states that he's had approximately 10-15 episodes of vomiting today. There's been no blood in the vomitus. It is of note that the patient has a diagnosed gastric ulcer. Patient also has a history of pancreatitis. Patient denies any recent blood in stool. He's had no fever. There's been no injury or trauma to the abdomen. The patient presents at this time for assistance with the pain and the nausea.  Patient is a 38 y.o. male presenting with abdominal pain and vomiting. The history is provided by the patient.  Abdominal Pain Pain location:  Periumbilical and epigastric Associated symptoms: fatigue, nausea and vomiting   Associated symptoms: no chest pain, no cough, no dysuria, no fever, no hematuria and no shortness of breath   Emesis Associated symptoms: abdominal pain   Associated symptoms: no arthralgias     Past Medical History  Diagnosis Date  . Hypertension   . Pancreatitis   . STEMI (ST elevation myocardial infarction) 01/19/14    normal cardiac cath  . Kidney stone     bil , different times  passed stones   Past Surgical History  Procedure Laterality Date  . Cardiac catheterization  Aug 2015    normal EF, normal coronaries  . Esophagogastroduodenoscopy  (egd) with propofol N/A 02/11/2014    Procedure: ESOPHAGOGASTRODUODENOSCOPY (EGD) WITH PROPOFOL;  Surgeon: Corbin Ade, MD;  Location: [facility];  Service: Endoscopy;  Laterality: N/A;  . Esophageal biopsy N/A 02/11/2014    Procedure: BIOPSY;  Surgeon: Corbin Ade, MD;  Location: [facility];  Service: Endoscopy;  Laterality: N/A;   Family History  Problem Relation Age of Onset  . Cancer Mother     Breast  . Colon cancer Neg Hx    History  Substance Use Topics  . Smoking status: Current Every Day Smoker -- 1.50 packs/day for 10 years    Types: Cigarettes  . Smokeless tobacco: Never Used  . Alcohol Use: No     Comment: occassionally   vodka and orange juice    Review of Systems  Constitutional: Positive for appetite change and fatigue. Negative for fever and activity change.       All ROS Neg except as noted in HPI  Eyes: Negative for photophobia and discharge.  Respiratory: Negative for cough, shortness of breath and wheezing.   Cardiovascular: Negative for chest pain and palpitations.  Gastrointestinal: Positive for nausea, vomiting and abdominal pain. Negative for blood in stool.  Genitourinary: Negative for dysuria, frequency and hematuria.  Musculoskeletal: Negative for arthralgias, back pain and neck pain.  Skin: Negative.   Neurological: Negative for dizziness, seizures and speech difficulty.  Psychiatric/Behavioral: Negative for hallucinations and confusion.      Allergies  Review of patient's allergies indicates no known allergies.  Home Medications  Prior to Admission medications   Medication Sig Start Date End Date Taking? Authorizing Provider  amLODipine (NORVASC) 10 MG tablet Take 1 tablet (10 mg total) by mouth daily. 02/01/14   Elliot Cousin, MD  HYDROcodone-acetaminophen (NORCO) 5-325 MG per tablet Take 1 tablet by mouth every 4 (four) hours as needed for severe pain. 02/17/14   Doug Sou, MD  HYDROcodone-acetaminophen (NORCO) 7.5-325 MG per tablet Take 2  tablets by mouth every 4 (four) hours as needed for severe pain (pain). 02/08/14   Nishant Dhungel, MD  metoCLOPramide (REGLAN) 10 MG tablet Take 1 tablet (10 mg total) by mouth every 6 (six) hours. 02/17/14   Doug Sou, MD  nicotine (NICODERM CQ - DOSED IN MG/24 HOURS) 21 mg/24hr patch Place 1 patch (21 mg total) onto the skin daily. 02/12/14   Rhetta Mura, MD  nicotine polacrilex (NICORETTE) 2 MG gum Take 1 each (2 mg total) by mouth as needed for smoking cessation. 02/12/14   Rhetta Mura, MD  nystatin cream (MYCOSTATIN) Apply 1 application topically 3 (three) times daily. Apply tid groin area prn 01/23/14   [name], MD  pantoprazole (PROTONIX) 40 MG tablet Take 1 tablet (40 mg total) by mouth 2 (two) times daily before a meal. 02/12/14   Tiffany Kocher, PA-C  promethazine (PHENERGAN) 25 MG tablet Take 1 tablet (25 mg total) by mouth every 8 (eight) hours as needed for nausea or vomiting. 02/18/14   [name], MD  sucralfate (CARAFATE) 1 G tablet Take 1 tablet (1 g total) by mouth 4 (four) times daily -  with meals and at bedtime. 02/12/14   Rhetta Mura, MD   BP 144/90  Pulse 122  Temp(Src) 99 F (37.2 C)  Resp 17  Ht  (1.905 m)  Wt 230 lb (104.327 kg)  BMI 28.75 kg/m2  SpO2 98% Physical Exam  Nursing note and vitals reviewed. Constitutional: He is oriented to person, place, and time. He appears well-developed and well-nourished.  Non-toxic appearance.  HENT:  Head: Normocephalic.  Right Ear: Tympanic membrane and external ear normal.  Left Ear: Tympanic membrane and external ear normal.  Eyes: EOM and lids are normal. Pupils are equal, round, and reactive to light.  Neck: Normal range of motion. Neck supple. Carotid bruit is not present.  Cardiovascular: Normal rate, regular rhythm, normal heart sounds, intact distal pulses and normal pulses.   Pulmonary/Chest: Breath sounds normal. No respiratory distress.  Abdominal: Soft. Bowel sounds are normal.  There is tenderness. There is no guarding.  There is pain to palpation in the epigastric and periumbilical area. The abdomen is soft. Bowel sounds are present and active. There is no mass appreciated.  No CVA tenderness appreciated.  Musculoskeletal: Normal range of motion.  Lymphadenopathy:       Head (right side): No submandibular adenopathy present.       Head (left side): No submandibular adenopathy present.    He has no cervical adenopathy.  Neurological: He is alert and oriented to person, place, and time. He has normal strength. No cranial nerve deficit or sensory deficit.  Skin: Skin is warm and dry.  Psychiatric: He has a normal mood and affect. His speech is normal.    ED Course  Procedures (including critical care time) Labs Review Labs Reviewed  CBC WITH DIFFERENTIAL - Abnormal; Notable for the following:    WBC 13.1 (*)    Neutrophils Relative % 79 (*)    Neutro Abs 10.2 (*)  All other components within normal limits  COMPREHENSIVE METABOLIC PANEL - Abnormal; Notable for the following:    Potassium 3.6 (*)    Glucose, Bld 122 (*)    Alkaline Phosphatase 144 (*)    Anion gap 16 (*)    All other components within normal limits    Imaging Review No results found.   EKG Interpretation None      MDM  Noted here in the emergency department.  Lipase is normal at 27. Complete blood count shows an elevation of the white blood cells, but consistent with the pattern of previous complete blood count is done here at this facility. The patient's metabolic panel shows potassium to be slightly low at 3.6, oral potassium has been given. The alkaline phosphatase 144, but in the pattern of his usual testing here at this facility.  Patient received some from the IV pain medication however it was not completely resolved. The nausea however was completely resolved with the IV medication. The patient is out of his Carafate and a prescription for the Carafate will be given.  Prescription for Norco was also given to the patient. The patient is encouraged to see his primary physician for assistance with his pain and discomfort until he is seen by his GI specialist. Patient is invited to return to the emergency department if any emergent changes, problems, or concerns.    Final diagnoses:  None    **I have reviewed nursing notes, vital signs, and all appropriate lab and imaging results for this patient.Kathie Dike, PA-C 03/05/14 [phone]

## 2014-03-05 NOTE — Telephone Encounter (Signed)
Wife states he ran out of carafate and started back vomiting. Seen here 02/18/14.  Seen in ED twice. Missed appt with Dr. Kendell Bane. Pt would like for [place] to set appt up again with specialist.

## 2014-03-09 ENCOUNTER — Telehealth: Payer: Self-pay | Admitting: Family Medicine

## 2014-03-09 MED ORDER — SUCRALFATE 1 G PO TABS: 1.0000 g | ORAL_TABLET | Freq: Three times a day (TID) | ORAL | Status: DC

## 2014-03-09 NOTE — Telephone Encounter (Signed)
Medication sent to pharmacy. Patient was notified.  

## 2014-03-09 NOTE — Telephone Encounter (Signed)
See 03/05/14 message. Still waiting for approval or denial for refill on this med.

## 2014-03-09 NOTE — Telephone Encounter (Signed)
Pt walked in to see since the [facility] ED doc did not send over the script to [facility] For him to get a refill on his sucralfate (CARAFATE) 1 G tablet  Pt is scared if he does not take this pill he will end up in the ED again.   Please send to [facility]

## 2014-03-09 NOTE — Telephone Encounter (Signed)
May send in script #120, 1 qid, 4 refills, keep f/u ov

## 2014-03-13 NOTE — Telephone Encounter (Signed)
See telephone message on 03/09/14

## 2014-03-17 ENCOUNTER — Ambulatory Visit: Payer: Self-pay | Admitting: Family Medicine

## 2014-04-01 ENCOUNTER — Encounter (HOSPITAL_COMMUNITY): Payer: Self-pay | Admitting: Emergency Medicine

## 2014-04-01 ENCOUNTER — Emergency Department (HOSPITAL_COMMUNITY)
Admission: EM | Admit: 2014-04-01 | Discharge: 2014-04-01 | Disposition: A | Discharge status: Home / Self Care | Payer: 59 | Attending: Emergency Medicine | Admitting: Emergency Medicine

## 2014-04-01 DIAGNOSIS — I252 Old myocardial infarction: Secondary | ICD-10-CM | POA: Insufficient documentation

## 2014-04-01 DIAGNOSIS — Z87442 Personal history of urinary calculi: Secondary | ICD-10-CM | POA: Diagnosis not present

## 2014-04-01 DIAGNOSIS — K257 Chronic gastric ulcer without hemorrhage or perforation: Secondary | ICD-10-CM | POA: Insufficient documentation

## 2014-04-01 DIAGNOSIS — Z720 Tobacco use: Secondary | ICD-10-CM | POA: Diagnosis not present

## 2014-04-01 DIAGNOSIS — R112 Nausea with vomiting, unspecified: Secondary | ICD-10-CM

## 2014-04-01 DIAGNOSIS — I10 Essential (primary) hypertension: Secondary | ICD-10-CM | POA: Insufficient documentation

## 2014-04-01 DIAGNOSIS — Z79899 Other long term (current) drug therapy: Secondary | ICD-10-CM | POA: Insufficient documentation

## 2014-04-01 LAB — CBC
HCT: 46.9 % (ref 39.0–52.0)
Hemoglobin: 16.4 g/dL (ref 13.0–17.0)
MCH: 30.5 pg (ref 26.0–34.0)
MCHC: 35 g/dL (ref 30.0–36.0)
MCV: 87.2 fL (ref 78.0–100.0)
PLATELETS: 353 10*3/uL (ref 150–400)
RBC: 5.38 MIL/uL (ref 4.22–5.81)
RDW: 14.2 % (ref 11.5–15.5)
WBC: 12.6 10*3/uL — ABNORMAL HIGH (ref 4.0–10.5)

## 2014-04-01 LAB — COMPREHENSIVE METABOLIC PANEL
ALBUMIN: 4.4 g/dL (ref 3.5–5.2)
ALT: 19 U/L (ref 0–53)
AST: 12 U/L (ref 0–37)
Alkaline Phosphatase: 147 U/L — ABNORMAL HIGH (ref 39–117)
Anion gap: 14 (ref 5–15)
BUN: 16 mg/dL (ref 6–23)
CALCIUM: 10.1 mg/dL (ref 8.4–10.5)
CO2: 27 mEq/L (ref 19–32)
CREATININE: 0.85 mg/dL (ref 0.50–1.35)
Chloride: 99 mEq/L (ref 96–112)
GFR calc Af Amer: >90 mL/min (ref >90)
GFR calc non Af Amer: >90 mL/min (ref >90)
Glucose, Bld: 107 mg/dL — ABNORMAL HIGH (ref 70–99)
Potassium: 3.6 mEq/L — ABNORMAL LOW (ref 3.7–5.3)
SODIUM: 140 meq/L (ref 137–147)
TOTAL PROTEIN: 8 g/dL (ref 6.0–8.3)
Total Bilirubin: 0.5 mg/dL (ref 0.3–1.2)

## 2014-04-01 MED ORDER — FAMOTIDINE IN NACL 20-0.9 MG/50ML-% IV SOLN
20.0000 mg | Freq: Once | INTRAVENOUS | Status: AC
  Administered 2014-04-01: 20 mg via INTRAVENOUS
  Filled 2014-04-01: qty 50

## 2014-04-01 MED ORDER — PANTOPRAZOLE SODIUM 20 MG PO TBEC: 20.0000 mg | DELAYED_RELEASE_TABLET | Freq: Every day | ORAL | Status: DC

## 2014-04-01 MED ORDER — SUCRALFATE 1 G PO TABS: 1.0000 g | ORAL_TABLET | Freq: Three times a day (TID) | ORAL | Status: DC

## 2014-04-01 MED ORDER — GI COCKTAIL ~~LOC~~
30.0000 mL | Freq: Once | ORAL | Status: AC
  Administered 2014-04-01: 30 mL via ORAL
  Filled 2014-04-01: qty 30

## 2014-04-01 MED ORDER — HYDROMORPHONE HCL 1 MG/ML IJ SOLN
1.0000 mg | Freq: Once | INTRAMUSCULAR | Status: AC
  Administered 2014-04-01: 1 mg via INTRAVENOUS
  Filled 2014-04-01: qty 1

## 2014-04-01 MED ORDER — ONDANSETRON HCL 4 MG/2ML IJ SOLN
4.0000 mg | Freq: Once | INTRAMUSCULAR | Status: AC
  Administered 2014-04-01: 4 mg via INTRAVENOUS
  Filled 2014-04-01: qty 2

## 2014-04-01 MED ORDER — PANTOPRAZOLE SODIUM 40 MG IV SOLR
40.0000 mg | Freq: Once | INTRAVENOUS | Status: AC
  Administered 2014-04-01: 40 mg via INTRAVENOUS
  Filled 2014-04-01: qty 40

## 2014-04-01 MED ORDER — FAMOTIDINE 40 MG PO TABS
40.0000 mg | ORAL_TABLET | Freq: Every day | ORAL | Status: DC
Start: 2014-04-01 — End: 2015-09-18

## 2014-04-01 NOTE — ED Notes (Signed)
Patient verbalizes understanding of discharge instructions,home care and follow up care. Patient ambulatory out of department at this time with spouse.

## 2014-04-01 NOTE — Discharge Instructions (Signed)
Peptic Ulcer A peptic ulcer is a sore in the lining of your esophagus (esophageal ulcer), stomach (gastric ulcer), or in the first part of your small intestine (duodenal ulcer). The ulcer causes erosion into the deeper tissue. CAUSES  Normally, the lining of the stomach and the small intestine protects itself from the acid that digests food. The protective lining can be damaged by:  An infection caused by a bacterium called Helicobacter pylori (H. pylori).  Regular use of nonsteroidal anti-inflammatory drugs (NSAIDs), such as ibuprofen or aspirin.  Smoking tobacco. Other risk factors include being older than 50, drinking alcohol excessively, and having a family history of ulcer disease.  SYMPTOMS   Burning pain or gnawing in the area between the chest and the belly button.  Heartburn.  Nausea and vomiting.  Bloating. The pain can be worse on an empty stomach and at night. If the ulcer results in bleeding, it can cause:  Black, tarry stools.  Vomiting of bright red blood.  Vomiting of coffee-ground-looking materials. DIAGNOSIS  A diagnosis is usually made based upon your history and an exam. Other tests and procedures may be performed to find the cause of the ulcer. Finding a cause will help determine the best treatment. Tests and procedures may include:  Blood tests, stool tests, or breath tests to check for the bacterium H. pylori.  An upper gastrointestinal (GI) series of the esophagus, stomach, and small intestine.  An endoscopy to examine the esophagus, stomach, and small intestine.  A biopsy. TREATMENT  Treatment may include:  Eliminating the cause of the ulcer, such as smoking, NSAIDs, or alcohol.  Medicines to reduce the amount of acid in your digestive tract.  Antibiotic medicines if the ulcer is caused by the H. pylori bacterium.  An upper endoscopy to treat a bleeding ulcer.  Surgery if the bleeding is severe or if the ulcer created a hole somewhere in the  digestive system. HOME CARE INSTRUCTIONS   Avoid tobacco, alcohol, and caffeine. Smoking can increase the acid in the stomach, and continued smoking will impair the healing of ulcers.  Avoid foods and drinks that seem to cause discomfort or aggravate your ulcer.  Only take medicines as directed by your caregiver. Do not substitute over-the-counter medicines for prescription medicines without talking to your caregiver.  Keep any follow-up appointments and tests as directed. SEEK MEDICAL CARE IF:   Your do not improve within 7 days of starting treatment.  You have ongoing indigestion or heartburn. SEEK IMMEDIATE MEDICAL CARE IF:   You have sudden, sharp, or persistent abdominal pain.  You have bloody or dark black, tarry stools.  You vomit blood or vomit that looks like coffee grounds.  You become light-headed, weak, or feel faint.  You become sweaty or clammy. MAKE SURE YOU:   Understand these instructions.  Will watch your condition.  Will get help right away if you are not doing well or get worse. Document Released: 05/26/2000 Document Revised: 10/13/2013 Document Reviewed: 12/27/2011 Exit[employer] Patient Information 2015 [employer]. This information is not intended to replace advice given to you by your health care provider. Make sure you discuss any questions you have with your health care provider.  

## 2014-04-01 NOTE — ED Provider Notes (Signed)
CSN: [id number]     Arrival date & time 04/01/14  2105 History   First MD Initiated Contact with Patient 04/01/14 2114     Chief Complaint  Patient presents with  . Emesis   Nicolas [name] is a 38 y.o. male with a hx of gastric ulcers presents to the ED c/o abd pain since yesterday and vomiting starting today. Reports that he ran out of his Protonix one week ago. Starting yesterday he has had epigastric pain with vomiting that started today. Reports vomiting undigested food. Current pain is 10/10 and does not radiate. He has attempted no treatments today. He denies hematemesis, changes to bowel habits, hematochezia, melena, hematuria, dysuria, fevers or chills. Reports he has not drank EtOH since his EGD in September. Reports his pain feels exactly the same as his last gastric ulcer.   (Consider location/radiation/quality/duration/timing/severity/associated sxs/prior Treatment) Patient is a 38 y.o. male presenting with vomiting. The history is provided by the patient.  Emesis Severity:  Moderate Duration:  1 day Timing:  Intermittent Quality:  Undigested food Able to tolerate:  Solids Progression:  Worsening Chronicity:  Recurrent Recent urination:  Normal Context: not post-tussive and not self-induced   Relieved by:  None tried Associated symptoms: abdominal pain   Associated symptoms: no arthralgias, no chills, no cough, no diarrhea, no fever, no headaches, no myalgias, no sore throat and no URI   Risk factors: no alcohol use and no diabetes     Past Medical History  Diagnosis Date  . Hypertension   . Pancreatitis   . STEMI (ST elevation myocardial infarction) 01/19/14    normal cardiac cath  . Kidney stone     bil , different times  passed stones   Past Surgical History  Procedure Laterality Date  . Cardiac catheterization  Aug 2015    normal EF, normal coronaries  . Esophagogastroduodenoscopy (egd) with propofol N/A 02/11/2014    Procedure: ESOPHAGOGASTRODUODENOSCOPY (EGD)  WITH PROPOFOL;  Surgeon: Corbin Ade, MD;  Location: [facility];  Service: Endoscopy;  Laterality: N/A;  . Esophageal biopsy N/A 02/11/2014    Procedure: BIOPSY;  Surgeon: Corbin Ade, MD;  Location: [facility];  Service: Endoscopy;  Laterality: N/A;   Family History  Problem Relation Age of Onset  . Cancer Mother     Breast  . Colon cancer Neg Hx    History  Substance Use Topics  . Smoking status: Current Every Day Smoker -- 1.50 packs/day for 10 years    Types: Cigarettes  . Smokeless tobacco: Never Used  . Alcohol Use: No     Comment: occassionally   vodka and orange juice    Review of Systems  Constitutional: Negative for chills.  HENT: Negative for sore throat.   Gastrointestinal: Positive for vomiting and abdominal pain. Negative for diarrhea.  Musculoskeletal: Negative for arthralgias and myalgias.  Neurological: Negative for headaches.  All other systems reviewed and are negative.     Allergies  Review of patient's allergies indicates no known allergies.  Home Medications   Prior to Admission medications   Medication Sig Start Date End Date Taking? Authorizing Provider  amLODipine (NORVASC) 10 MG tablet Take 1 tablet (10 mg total) by mouth daily. 02/01/14  Yes Elliot Cousin, MD  pantoprazole (PROTONIX) 40 MG tablet Take 1 tablet (40 mg total) by mouth 2 (two) times daily before a meal. 02/12/14  Yes Tiffany Kocher, PA-C  promethazine (PHENERGAN) 25 MG tablet Take 1 tablet (25 mg total) by mouth every 8 (  eight) hours as needed for nausea or vomiting. 02/18/14  Yes Babs SciaraScott A Luking, MD  sucralfate (CARAFATE) 1 G tablet Take 1 tablet (1 g total) by mouth 4 (four) times daily -  with meals and at bedtime. 03/09/14  Yes Babs SciaraScott A Luking, MD  famotidine (PEPCID) 40 MG tablet Take 1 tablet (40 mg total) by mouth daily. 04/01/14   Lawana ChambersWilliam Duncan Nathaniel Wakeley, PA  metoCLOPramide (REGLAN) 10 MG tablet Take 1 tablet (10 mg total) by mouth every 6 (six) hours. 02/17/14   Doug SouSam Jacubowitz, MD   pantoprazole (PROTONIX) 20 MG tablet Take 1 tablet (20 mg total) by mouth daily. 04/01/14   Lawana ChambersWilliam Duncan Sheretha Shadd, PA  sucralfate (CARAFATE) 1 G tablet Take 1 tablet (1 g total) by mouth 4 (four) times daily -  with meals and at bedtime. 04/01/14   Einar GipWilliam Duncan Tequisha Maahs, PA   BP 142/100  Pulse 101  Temp(Src) 97.6 F (36.4 C) (Oral)  Resp 20  Ht 6\' 3"  (1.905 m)  Wt 240 lb (108.863 kg)  BMI 30.00 kg/m2  SpO2 99% Physical Exam  Nursing note and vitals reviewed. Constitutional: He appears well-developed and well-nourished. No distress.  HENT:  Head: Normocephalic and atraumatic.  Mouth/Throat: Oropharynx is clear and moist. No oropharyngeal exudate.  Eyes: Pupils are equal, round, and reactive to light.  Neck: Normal range of motion. Neck supple.  Cardiovascular: Normal rate, regular rhythm, normal heart sounds and intact distal pulses.  Exam reveals no gallop and no friction rub.   No murmur heard. Repeat BP during PE: 155/90, HR 96  Pulmonary/Chest: Effort normal and breath sounds normal. No respiratory distress. He has no wheezes. He has no rales.  Abdominal: Soft. Bowel sounds are normal. He exhibits no distension and no mass. There is tenderness. There is no rebound and no guarding.  Mild epigastric tenderness.   Musculoskeletal: Normal range of motion.  Lymphadenopathy:    He has no cervical adenopathy.  Neurological: He is alert. Coordination normal.  Skin: Skin is warm and dry. No rash noted. He is not diaphoretic. No erythema. No pallor.  Psychiatric: He has a normal mood and affect. His behavior is normal.    ED Course  Procedures (including critical care time) Labs Review Labs Reviewed  CBC - Abnormal; Notable for the following:    WBC 12.6 (*)    All other components within normal limits  COMPREHENSIVE METABOLIC PANEL - Abnormal; Notable for the following:    Potassium 3.6 (*)    Glucose, Bld 107 (*)    Alkaline Phosphatase 147 (*)    All other components within  normal limits   Filed Vitals:   04/01/14 2200 04/01/14 2230 04/01/14 2300 04/01/14 2330  BP: 151/102 158/106 154/104 142/100  Pulse: 93 90 92 101  Temp:      TempSrc:      Resp:      Height:      Weight:      SpO2: 98% 97% 98% 99%    Imaging Review No results found.   EKG Interpretation None      MDM   Final diagnoses:  Chronic gastric ulcer  Non-intractable vomiting with nausea, vomiting of unspecified type   Meds given in ED:  Medications  HYDROmorphone (DILAUDID) injection 1 mg (not administered)  ondansetron (ZOFRAN) injection 4 mg (not administered)  gi cocktail (Maalox,Lidocaine,Donnatal) (30 mLs Oral Given 04/01/14 2202)  famotidine (PEPCID) IVPB 20 mg (20 mg Intravenous New Bag/Given 04/01/14 2202)  pantoprazole (PROTONIX) injection 40 mg (  40 mg Intravenous Given 04/01/14 2202)  ondansetron [facility]) injection 4 mg (4 mg Intravenous Given 04/01/14 2202)    Discharge Medication List as of 04/01/2014 11:28 PM      Patient with hx of gastic ulcers c/o epigastric pain and N/V with non-distended, non-rigid abd without guarding or rebound pain and no hematemesis. His pain and nausea improved with medications given in ED. At 22:39 he was no longer vomiting and reports his pain had improved.  Labs show WBC of 12.6 which is lower than his WBC of 13.1 4 weeks ago. H/H WNL. No further studies indicated at this time.  Advised patient that he needed to restart taking Protonix and needs to follow-up with his gastroenterologist in the next week. Advised to avoid NSAIDS, aspirin, and alcohol. Advised to return to the ED if new or worsening symptoms or new concerns. Patient verbalized understanding and agreement with plan.   Patient seen in conjunction with Dr. Hyacinth Meeker.   Lawana Chambers, [place] 04/02/14 Salley Hews

## 2014-04-01 NOTE — ED Provider Notes (Signed)
38 year old male with a known history of a peptic ulcer near the pylorus as of endoscopy performed early September 2015. He presents with a complaint of epigastric discomfort. He has associated nausea and vomiting, he stopped taking his medication when he ran out approximately one week ago. He was supposed to be taking Protonix. On exam the patient has epigastric tenderness but no peritoneal signs, no guarding, no lower abdominal tenderness, no pain in the bridge point, no tenderness in the right upper quadrant, no Murphy sign. Lungs and heart normal, no peripheral edema  Patient likely has ongoing pain related to gastric ulcer, will treat aggressively with proton pump inhibitor, H2 blocker, likely can benefit from Carafate as outpatient as well.  Medical screening examination/treatment/procedure(s) were conducted as a shared visit with non-physician practitioner(s) and myself.  I personally evaluated the patient during the encounter.  Clinical Impression:   Final diagnoses:  Chronic gastric ulcer  Non-intractable vomiting with nausea, vomiting of unspecified type        Vida Roller, MD 04/03/14 [id number]

## 2014-04-01 NOTE — ED Notes (Signed)
Pt c/o abd pain starting yesterday and vomiting started today.

## 2014-04-01 NOTE — ED Notes (Signed)
Patient states abdominal pain that started yesterday, vomiting started this morning. Emesis X10 per patient. A&OX4. Patient states "it my ulcers again"

## 2014-04-03 NOTE — ED Provider Notes (Signed)
Medical screening examination/treatment/procedure(s) were conducted as a shared visit with non-physician practitioner(s) and myself.  I personally evaluated the patient during the encounter  Please see my separate respective documentation pertaining to this patient encounter   Vida Roller, MD 04/03/14 0900

## 2014-04-22 ENCOUNTER — Encounter: Payer: Self-pay | Admitting: Internal Medicine

## 2014-04-23 ENCOUNTER — Ambulatory Visit: Payer: Self-pay | Admitting: Gastroenterology

## 2014-05-17 ENCOUNTER — Encounter (HOSPITAL_COMMUNITY): Payer: Self-pay | Admitting: Emergency Medicine

## 2014-05-17 ENCOUNTER — Emergency Department (HOSPITAL_COMMUNITY)
Admission: EM | Admit: 2014-05-17 | Discharge: 2014-05-17 | Disposition: A | Discharge status: Home / Self Care | Payer: 59 | Attending: Emergency Medicine | Admitting: Emergency Medicine

## 2014-05-17 DIAGNOSIS — Z9889 Other specified postprocedural states: Secondary | ICD-10-CM | POA: Insufficient documentation

## 2014-05-17 DIAGNOSIS — Z79899 Other long term (current) drug therapy: Secondary | ICD-10-CM | POA: Insufficient documentation

## 2014-05-17 DIAGNOSIS — Z87442 Personal history of urinary calculi: Secondary | ICD-10-CM | POA: Diagnosis not present

## 2014-05-17 DIAGNOSIS — G43909 Migraine, unspecified, not intractable, without status migrainosus: Secondary | ICD-10-CM | POA: Insufficient documentation

## 2014-05-17 DIAGNOSIS — Z8719 Personal history of other diseases of the digestive system: Secondary | ICD-10-CM | POA: Insufficient documentation

## 2014-05-17 DIAGNOSIS — R519 Headache, unspecified: Secondary | ICD-10-CM

## 2014-05-17 DIAGNOSIS — I10 Essential (primary) hypertension: Secondary | ICD-10-CM | POA: Insufficient documentation

## 2014-05-17 DIAGNOSIS — R51 Headache: Secondary | ICD-10-CM

## 2014-05-17 HISTORY — DX: Migraine, unspecified, not intractable, without status migrainosus: G43.909

## 2014-05-17 MED ORDER — KETOROLAC TROMETHAMINE 30 MG/ML IJ SOLN
30.0000 mg | Freq: Once | INTRAMUSCULAR | Status: AC
  Administered 2014-05-17: 30 mg via INTRAVENOUS
  Filled 2014-05-17: qty 1

## 2014-05-17 MED ORDER — DIPHENHYDRAMINE HCL 50 MG/ML IJ SOLN
25.0000 mg | Freq: Once | INTRAMUSCULAR | Status: AC
  Administered 2014-05-17: 25 mg via INTRAVENOUS
  Filled 2014-05-17: qty 1

## 2014-05-17 MED ORDER — SODIUM CHLORIDE 0.9 % IV BOLUS (SEPSIS)
1000.0000 mL | Freq: Once | INTRAVENOUS | Status: AC
  Administered 2014-05-17: 1000 mL via INTRAVENOUS

## 2014-05-17 MED ORDER — METOCLOPRAMIDE HCL 5 MG/ML IJ SOLN
10.0000 mg | Freq: Once | INTRAMUSCULAR | Status: AC
  Administered 2014-05-17: 10 mg via INTRAVENOUS
  Filled 2014-05-17: qty 2

## 2014-05-17 NOTE — Discharge Instructions (Signed)
Increase fluids. Can take Tylenol for headache. Rest in quiet dark room

## 2014-05-17 NOTE — ED Notes (Signed)
Patient states that head is not throbbing now like when he first arrived to ER. States is sore but tolerable.

## 2014-05-17 NOTE — ED Notes (Signed)
Pt reports migraine, vomiting x2 days. Pt reports light and sound sensitivity.

## 2014-05-17 NOTE — ED Provider Notes (Signed)
CSN: [id number]     Arrival date & time 05/17/14  1714 History   First MD Initiated Contact with Patient 05/17/14 1922     Chief Complaint  Patient presents with  . Migraine     (Consider location/radiation/quality/duration/timing/severity/associated sxs/prior Treatment) HPI.... Generalized headache for 2-3 days. No fever, chills, stiff neck, neurodeficits. Patient has similar headaches approximately 2-3 times per year. He has taken nothing for it at home. Severity is moderate. Nothing makes symptoms better or worse.  Past Medical History  Diagnosis Date  . Hypertension   . Pancreatitis   . Kidney stone     bil , different times  passed stones  . Migraines    Past Surgical History  Procedure Laterality Date  . Cardiac catheterization  Aug 2015    normal EF, normal coronaries  . Esophagogastroduodenoscopy (egd) with propofol N/A 02/11/2014    Procedure: ESOPHAGOGASTRODUODENOSCOPY (EGD) WITH PROPOFOL;  Surgeon: Corbin Adeobert M Rourk, MD;  Location: [facility];  Service: Endoscopy;  Laterality: N/A;  . Esophageal biopsy N/A 02/11/2014    Procedure: BIOPSY;  Surgeon: Corbin Adeobert M Rourk, MD;  Location: [facility];  Service: Endoscopy;  Laterality: N/A;  . Cardiac catheterization     Family History  Problem Relation Age of Onset  . Cancer Mother     Breast  . Colon cancer Neg Hx    History  Substance Use Topics  . Smoking status: Current Every Day Smoker -- 1.50 packs/day for 10 years    Types: Cigarettes  . Smokeless tobacco: Never Used  . Alcohol Use: No     Comment: occassionally   vodka and orange juice    Review of Systems  All other systems reviewed and are negative.     Allergies  Review of patient's allergies indicates no known allergies.  Home Medications   Prior to Admission medications   Medication Sig Start Date End Date Taking? Authorizing Provider  amLODipine (NORVASC) 10 MG tablet Take 1 tablet (10 mg total) by mouth daily. 02/01/14  Yes Elliot Cousinenise Fisher, MD  famotidine  (PEPCID) 40 MG tablet Take 1 tablet (40 mg total) by mouth daily. Patient not taking: Reported on 05/17/2014 04/01/14   Einar GipWilliam Duncan Dansie, PA-C  metoCLOPramide (REGLAN) 10 MG tablet Take 1 tablet (10 mg total) by mouth every 6 (six) hours. Patient not taking: Reported on 05/17/2014 02/17/14   Doug SouSam Jacubowitz, MD  pantoprazole (PROTONIX) 20 MG tablet Take 1 tablet (20 mg total) by mouth daily. Patient not taking: Reported on 05/17/2014 04/01/14   Einar GipWilliam Duncan Dansie, PA-C  pantoprazole (PROTONIX) 40 MG tablet Take 1 tablet (40 mg total) by mouth 2 (two) times daily before a meal. Patient not taking: Reported on 05/17/2014 02/12/14   Tiffany KocherLeslie S Lewis, PA-C  promethazine (PHENERGAN) 25 MG tablet Take 1 tablet (25 mg total) by mouth every 8 (eight) hours as needed for nausea or vomiting. Patient not taking: Reported on 05/17/2014 02/18/14   Babs SciaraScott A Luking, MD  sucralfate (CARAFATE) 1 G tablet Take 1 tablet (1 g total) by mouth 4 (four) times daily -  with meals and at bedtime. Patient not taking: Reported on 05/17/2014 03/09/14   Babs SciaraScott A Luking, MD  sucralfate (CARAFATE) 1 G tablet Take 1 tablet (1 g total) by mouth 4 (four) times daily -  with meals and at bedtime. Patient not taking: Reported on 05/17/2014 04/01/14   Einar GipWilliam Duncan Dansie, PA-C   BP 140/94 mmHg  Pulse 81  Temp(Src) 98.1 F (36.7 C) (Oral)  Resp  20  Ht 6\' 3"  (1.905 m)  Wt 230 lb (104.327 kg)  BMI 28.75 kg/m2  SpO2 99% Physical Exam  Constitutional: He is oriented to person, place, and time.  Photophobic  HENT:  Head: Normocephalic and atraumatic.  Eyes: Conjunctivae and EOM are normal. Pupils are equal, round, and reactive to light.  Neck: Normal range of motion. Neck supple.  Cardiovascular: Normal rate, regular rhythm and normal heart sounds.   Pulmonary/Chest: Effort normal and breath sounds normal.  Abdominal: Soft. Bowel sounds are normal.  Musculoskeletal: Normal range of motion.  Neurological: He is alert and oriented to  person, place, and time.  Skin: Skin is warm and dry.  Psychiatric: He has a normal mood and affect. His behavior is normal.  Nursing note and vitals reviewed.   ED Course  Procedures (including critical care time) Labs Review Labs Reviewed - No data to display  Imaging Review No results found.   EKG Interpretation None      MDM   Final diagnoses:  Headache, unspecified headache type    Patient feels better after IV fluids, IV Reglan, IV Benadryl, IV Toradol. No neuro deficits.    Donnetta Hutching, MD 05/17/14 2154

## 2014-05-20 ENCOUNTER — Ambulatory Visit: Payer: 59 | Admitting: Gastroenterology

## 2014-05-21 ENCOUNTER — Encounter (HOSPITAL_COMMUNITY): Payer: Self-pay | Admitting: Cardiovascular Disease

## 2014-09-26 ENCOUNTER — Emergency Department (HOSPITAL_COMMUNITY)
Admission: EM | Admit: 2014-09-26 | Discharge: 2014-09-26 | Disposition: A | Discharge status: Home / Self Care | Payer: 59 | Attending: Emergency Medicine | Admitting: Emergency Medicine

## 2014-09-26 ENCOUNTER — Encounter (HOSPITAL_COMMUNITY): Payer: Self-pay | Admitting: Cardiology

## 2014-09-26 DIAGNOSIS — Z720 Tobacco use: Secondary | ICD-10-CM | POA: Insufficient documentation

## 2014-09-26 DIAGNOSIS — G43909 Migraine, unspecified, not intractable, without status migrainosus: Secondary | ICD-10-CM | POA: Insufficient documentation

## 2014-09-26 DIAGNOSIS — L0291 Cutaneous abscess, unspecified: Secondary | ICD-10-CM

## 2014-09-26 DIAGNOSIS — Z8719 Personal history of other diseases of the digestive system: Secondary | ICD-10-CM | POA: Insufficient documentation

## 2014-09-26 DIAGNOSIS — L02412 Cutaneous abscess of left axilla: Secondary | ICD-10-CM | POA: Insufficient documentation

## 2014-09-26 DIAGNOSIS — I10 Essential (primary) hypertension: Secondary | ICD-10-CM | POA: Insufficient documentation

## 2014-09-26 DIAGNOSIS — Z9889 Other specified postprocedural states: Secondary | ICD-10-CM | POA: Insufficient documentation

## 2014-09-26 DIAGNOSIS — Z87442 Personal history of urinary calculi: Secondary | ICD-10-CM | POA: Insufficient documentation

## 2014-09-26 DIAGNOSIS — Z79899 Other long term (current) drug therapy: Secondary | ICD-10-CM | POA: Insufficient documentation

## 2014-09-26 DIAGNOSIS — Z792 Long term (current) use of antibiotics: Secondary | ICD-10-CM | POA: Insufficient documentation

## 2014-09-26 MED ORDER — SULFAMETHOXAZOLE-TRIMETHOPRIM 800-160 MG PO TABS: 2.0000 | ORAL_TABLET | Freq: Two times a day (BID) | ORAL | Status: AC

## 2014-09-26 MED ORDER — SULFAMETHOXAZOLE-TRIMETHOPRIM 800-160 MG PO TABS
1.0000 | ORAL_TABLET | Freq: Once | ORAL | Status: AC
  Administered 2014-09-26: 1 via ORAL
  Filled 2014-09-26: qty 1

## 2014-09-26 MED ORDER — CEPHALEXIN 500 MG PO CAPS
500.0000 mg | ORAL_CAPSULE | Freq: Once | ORAL | Status: AC
  Administered 2014-09-26: 500 mg via ORAL
  Filled 2014-09-26: qty 1

## 2014-09-26 MED ORDER — HYDROCODONE-ACETAMINOPHEN 5-325 MG PO TABS: 1.0000 | ORAL_TABLET | ORAL | Status: DC | PRN

## 2014-09-26 MED ORDER — HYDROCODONE-ACETAMINOPHEN 5-325 MG PO TABS
1.0000 | ORAL_TABLET | Freq: Once | ORAL | Status: AC
  Administered 2014-09-26: 1 via ORAL
  Filled 2014-09-26: qty 1

## 2014-09-26 MED ORDER — CEPHALEXIN 500 MG PO CAPS: 500.0000 mg | ORAL_CAPSULE | Freq: Four times a day (QID) | ORAL | Status: DC

## 2014-09-26 MED ORDER — LIDOCAINE-EPINEPHRINE (PF) 1 %-1:200000 IJ SOLN
INTRAMUSCULAR | Status: AC
  Filled 2014-09-26: qty 10

## 2014-09-26 NOTE — ED Provider Notes (Signed)
CSN: [id number]     Arrival date & time 09/26/14  1632 History   First MD Initiated Contact with Patient 09/26/14 1639     Chief Complaint  Patient presents with  . Abscess      HPI  Patient presents with a complaint of a boil". A red painful area on his left anterior thigh. He does not recall any inciting event. No wounds. No bites. No past similar episodes. No history of MRSA infections.  Past Medical History  Diagnosis Date  . Hypertension   . Pancreatitis   . Kidney stone     bil , different times  passed stones  . Migraines    Past Surgical History  Procedure Laterality Date  . Cardiac catheterization  Aug 2015    normal EF, normal coronaries  . Esophagogastroduodenoscopy (egd) with propofol N/A 02/11/2014    Procedure: ESOPHAGOGASTRODUODENOSCOPY (EGD) WITH PROPOFOL;  Surgeon: Corbin Ade, MD;  Location: [facility];  Service: Endoscopy;  Laterality: N/A;  . Esophageal biopsy N/A 02/11/2014    Procedure: BIOPSY;  Surgeon: Corbin Ade, MD;  Location: [facility];  Service: Endoscopy;  Laterality: N/A;  . Cardiac catheterization    . Left heart catheterization with coronary angiogram N/A 01/18/2014    Procedure: LEFT HEART CATHETERIZATION WITH CORONARY ANGIOGRAM;  Surgeon: Lennette Bihari, MD;  Location: [facility] CATH LAB;  Service: Cardiovascular;  Laterality: N/A;   Family History  Problem Relation Age of Onset  . Cancer Mother     Breast  . Colon cancer Neg Hx    History  Substance Use Topics  . Smoking status: Current Every Day Smoker -- 1.50 packs/day for 10 years    Types: Cigarettes  . Smokeless tobacco: Never Used  . Alcohol Use: No     Comment: occassionally   vodka and orange juice    Review of Systems  Constitutional: Negative for fever, chills, diaphoresis, appetite change and fatigue.  HENT: Negative for mouth sores, sore throat and trouble swallowing.   Eyes: Negative for visual disturbance.  Respiratory: Negative for cough, chest tightness, shortness of breath and  wheezing.   Cardiovascular: Negative for chest pain.  Gastrointestinal: Negative for nausea, vomiting, abdominal pain, diarrhea and abdominal distention.  Endocrine: Negative for polydipsia, polyphagia and polyuria.  Genitourinary: Negative for dysuria, frequency and hematuria.  Musculoskeletal: Negative for gait problem.  Skin: Positive for color change and wound. Negative for pallor and rash.  Neurological: Negative for dizziness, syncope, light-headedness and headaches.  Hematological: Does not bruise/bleed easily.  Psychiatric/Behavioral: Negative for behavioral problems and confusion.      Allergies  Review of patient's allergies indicates no known allergies.  Home Medications   Prior to Admission medications   Medication Sig Start Date End Date Taking? Authorizing Provider  amLODipine (NORVASC) 10 MG tablet Take 1 tablet (10 mg total) by mouth daily. 02/01/14   Elliot Cousin, MD  cephALEXin (KEFLEX) 500 MG capsule Take 1 capsule (500 mg total) by mouth 4 (four) times daily. 09/26/14   Rolland Porter, MD  famotidine (PEPCID) 40 MG tablet Take 1 tablet (40 mg total) by mouth daily. Patient not taking: Reported on 05/17/2014 04/01/14   Everlene Farrier, PA-C  HYDROcodone-acetaminophen (NORCO/VICODIN) 5-325 MG per tablet Take 1 tablet by mouth every 4 (four) hours as needed. 09/26/14   Rolland Porter, MD  metoCLOPramide (REGLAN) 10 MG tablet Take 1 tablet (10 mg total) by mouth every 6 (six) hours. Patient not taking: Reported on 05/17/2014 02/17/14   Doug Sou,  MD  pantoprazole (PROTONIX) 20 MG tablet Take 1 tablet (20 mg total) by mouth daily. Patient not taking: Reported on 05/17/2014 04/01/14   Everlene Farrier, PA-C  pantoprazole (PROTONIX) 40 MG tablet Take 1 tablet (40 mg total) by mouth 2 (two) times daily before a meal. Patient not taking: Reported on 05/17/2014 02/12/14   Tiffany Kocher, PA-C  promethazine (PHENERGAN) 25 MG tablet Take 1 tablet (25 mg total) by mouth every 8 (eight) hours  as needed for nausea or vomiting. Patient not taking: Reported on 05/17/2014 02/18/14   [name], MD  sucralfate (CARAFATE) 1 G tablet Take 1 tablet (1 g total) by mouth 4 (four) times daily -  with meals and at bedtime. Patient not taking: Reported on 05/17/2014 03/09/14   [name], MD  sucralfate (CARAFATE) 1 G tablet Take 1 tablet (1 g total) by mouth 4 (four) times daily -  with meals and at bedtime. Patient not taking: Reported on 05/17/2014 04/01/14   Everlene Farrier, PA-C  sulfamethoxazole-trimethoprim (BACTRIM DS,SEPTRA DS) 800-160 MG per tablet Take 2 tablets by mouth 2 (two) times daily. 09/26/14 10/03/14  Rolland Porter, MD   BP 158/88 mmHg  Pulse 108  Temp(Src) 99.1 F (37.3 C) (Oral)  Resp 14  Ht  (1.905 m)  Wt 245 lb (111.131 kg)  BMI 30.62 kg/m2  SpO2 99% Physical Exam  Constitutional: He is oriented to person, place, and time. He appears well-developed and well-nourished. No distress.  HENT:  Head: Normocephalic.  Eyes: Conjunctivae are normal. Pupils are equal, round, and reactive to light. No scleral icterus.  Neck: Normal range of motion. Neck supple. No thyromegaly present.  Cardiovascular: Normal rate and regular rhythm.  Exam reveals no gallop and no friction rub.   No murmur heard. Pulmonary/Chest: Effort normal and breath sounds normal. No respiratory distress. He has no wheezes. He has no rales.  Abdominal: Soft. Bowel sounds are normal. He exhibits no distension. There is no tenderness. There is no rebound.  Musculoskeletal: Normal range of motion.  Neurological: He is alert and oriented to person, place, and time.  Skin: Skin is warm and dry. No rash noted.     Psychiatric: He has a normal mood and affect. His behavior is normal.    ED Course  Procedures (including critical care time) Labs Review Labs Reviewed - No data to display  Imaging Review No results found.   EKG Interpretation None     INCISION AND DRAINAGE Performed by: Claudean Kinds Consent: Verbal consent obtained. Risks and benefits: risks, benefits and alternatives were discussed Type: abscess  Body area: Lt anterior thigh  Anesthesia: local infiltration  Incision was made with a scalpel.  Local anesthetic: lidocaine 1% with epinephrine  Anesthetic total: 5 ml  Complexity: complex Blunt dissection to break up loculations  Drainage: purulent  Drainage amount: 1-85ml  Packing material: 1/4 in iodoform gauze  Patient tolerance: Patient tolerated the procedure well with no immediate complications.     MDM   Final diagnoses:  Abscess    I have asked him to soak , and removed the gauze tomorrow tomorrow. To continue to flush the wound was underwater massage today as per day. Prescription for Keflex, Bactrim DS. Return here if not improving.    Rolland Porter, MD 09/26/14 1739

## 2014-09-26 NOTE — ED Notes (Signed)
Boil to left upper leg.

## 2014-09-26 NOTE — Discharge Instructions (Signed)
Soak  and remove the gauze tomorrow. Continue soaks with gentle massage to flush the wound twice per day Abscess An abscess is an infected area that contains a collection of pus and debris.It can occur in almost any part of the body. An abscess is also known as a furuncle or boil. CAUSES  An abscess occurs when tissue gets infected. This can occur from blockage of oil or sweat glands, infection of hair follicles, or a minor injury to the skin. As the body tries to fight the infection, pus collects in the area and creates pressure under the skin. This pressure causes pain. People with weakened immune systems have difficulty fighting infections and get certain abscesses more often.  SYMPTOMS Usually an abscess develops on the skin and becomes a painful mass that is red, warm, and tender. If the abscess forms under the skin, you may feel a moveable soft area under the skin. Some abscesses break open (rupture) on their own, but most will continue to get worse without care. The infection can spread deeper into the body and eventually into the bloodstream, causing you to feel ill.  DIAGNOSIS  Your caregiver will take your medical history and perform a physical exam. A sample of fluid may also be taken from the abscess to determine what is causing your infection. TREATMENT  Your caregiver may prescribe antibiotic medicines to fight the infection. However, taking antibiotics alone usually does not cure an abscess. Your caregiver may need to make a small cut (incision) in the abscess to drain the pus. In some cases, gauze is packed into the abscess to reduce pain and to continue draining the area. HOME CARE INSTRUCTIONS   Only take over-the-counter or prescription medicines for pain, discomfort, or fever as directed by your caregiver.  If you were prescribed antibiotics, take them as directed. Finish them even if you start to feel better.  If gauze is used, follow your caregiver's directions for changing  the gauze.  To avoid spreading the infection:  Keep your draining abscess covered with a bandage.  Wash your hands well.  Do not share personal care items, towels, or whirlpools with others.  Avoid skin contact with others.  Keep your skin and clothes clean around the abscess.  Keep all follow-up appointments as directed by your caregiver. SEEK MEDICAL CARE IF:   You have increased pain, swelling, redness, fluid drainage, or bleeding.  You have muscle aches, chills, or a general ill feeling.  You have a fever. MAKE SURE YOU:   Understand these instructions.  Will watch your condition.  Will get help right away if you are not doing well or get worse. Document Released: 03/08/2005 Document Revised: 11/28/2011 Document Reviewed: 08/11/2011 [facility] Patient Information 2015 [place], [place]. This information is not intended to replace advice given to you by your health care provider. Make sure you discuss any questions you have with your health care provider.  Cellulitis Cellulitis is an infection of the skin and the tissue beneath it. The infected area is usually red and tender. Cellulitis occurs most often in the arms and lower legs.  CAUSES  Cellulitis is caused by bacteria that enter the skin through cracks or cuts in the skin. The most common types of bacteria that cause cellulitis are staphylococci and streptococci. SIGNS AND SYMPTOMS   Redness and warmth.  Swelling.  Tenderness or pain.  Fever. DIAGNOSIS  Your health care provider can usually determine what is wrong based on a physical exam. Blood tests may also  be done. TREATMENT  Treatment usually involves taking an antibiotic medicine. HOME CARE INSTRUCTIONS   Take your antibiotic medicine as directed by your health care provider. Finish the antibiotic even if you start to feel better.  Keep the infected arm or leg elevated to reduce swelling.  Apply a warm cloth to the affected area up to 4 times per day  to relieve pain.  Take medicines only as directed by your health care provider.  Keep all follow-up visits as directed by your health care provider. SEEK MEDICAL CARE IF:   You notice red streaks coming from the infected area.  Your red area gets larger or turns dark in color.  Your bone or joint underneath the infected area becomes painful after the skin has healed.  Your infection returns in the same area or another area.  You notice a swollen bump in the infected area.  You develop new symptoms.  You have a fever. SEEK IMMEDIATE MEDICAL CARE IF:   You feel very sleepy.  You develop vomiting or diarrhea.  You have a general ill feeling (malaise) with muscle aches and pains. MAKE SURE YOU:   Understand these instructions.  Will watch your condition.  Will get help right away if you are not doing well or get worse. Document Released: 03/08/2005 Document Revised: 10/13/2013 Document Reviewed: 08/14/2011 [facility] Patient Information 2015 [place], [place]. This information is not intended to replace advice given to you by your health care provider. Make sure you discuss any questions you have with your health care provider.

## 2014-10-11 ENCOUNTER — Encounter (HOSPITAL_COMMUNITY): Payer: Self-pay | Admitting: Emergency Medicine

## 2014-10-11 ENCOUNTER — Emergency Department (HOSPITAL_COMMUNITY)
Admission: EM | Admit: 2014-10-11 | Discharge: 2014-10-11 | Disposition: A | Discharge status: Home / Self Care | Payer: 59 | Attending: Emergency Medicine | Admitting: Emergency Medicine

## 2014-10-11 DIAGNOSIS — I10 Essential (primary) hypertension: Secondary | ICD-10-CM | POA: Insufficient documentation

## 2014-10-11 DIAGNOSIS — H6981 Other specified disorders of Eustachian tube, right ear: Secondary | ICD-10-CM

## 2014-10-11 DIAGNOSIS — Z79899 Other long term (current) drug therapy: Secondary | ICD-10-CM | POA: Insufficient documentation

## 2014-10-11 DIAGNOSIS — Z87442 Personal history of urinary calculi: Secondary | ICD-10-CM | POA: Insufficient documentation

## 2014-10-11 DIAGNOSIS — Z8719 Personal history of other diseases of the digestive system: Secondary | ICD-10-CM | POA: Insufficient documentation

## 2014-10-11 DIAGNOSIS — Z720 Tobacco use: Secondary | ICD-10-CM | POA: Insufficient documentation

## 2014-10-11 DIAGNOSIS — H6991 Unspecified Eustachian tube disorder, right ear: Secondary | ICD-10-CM | POA: Insufficient documentation

## 2014-10-11 MED ORDER — FEXOFENADINE-PSEUDOEPHED ER 60-120 MG PO TB12
1.0000 | ORAL_TABLET | Freq: Two times a day (BID) | ORAL | Status: DC
Start: 2014-10-11 — End: 2015-09-18

## 2014-10-11 NOTE — ED Notes (Signed)
Patient reports ear fullness. States cannot hear out of right ear. States ear started bothering him 3 weeks ago, but got better, and worsened on Thursday.

## 2014-10-13 NOTE — ED Provider Notes (Signed)
CSN: [id number]     Arrival date & time 10/11/14  1900 History   First MD Initiated Contact with Patient 10/11/14 2028     Chief Complaint  Patient presents with  . Ear Fullness     (Consider location/radiation/quality/duration/timing/severity/associated sxs/prior Treatment) The history is provided by the patient.   Nicolas [name] is a 39 y.o. male presenting with an approximate 3 week history of a "fullness" sensation and inability to hear out of his right ear.  He denies pain, drainage, fevers, recent history of uri's, altitude changes or any activities that could cause barotrauma.  He has employed ear wax removal kits with no relief of symptoms. Other pertinent negative include no headaches, nasal congestion, visual changes, dizziness.     Past Medical History  Diagnosis Date  . Hypertension   . Pancreatitis   . Kidney stone     bil , different times  passed stones  . Migraines    Past Surgical History  Procedure Laterality Date  . Cardiac catheterization  Aug 2015    normal EF, normal coronaries  . Esophagogastroduodenoscopy (egd) with propofol N/A 02/11/2014    Procedure: ESOPHAGOGASTRODUODENOSCOPY (EGD) WITH PROPOFOL;  Surgeon: Corbin Ade, MD;  Location: [facility];  Service: Endoscopy;  Laterality: N/A;  . Esophageal biopsy N/A 02/11/2014    Procedure: BIOPSY;  Surgeon: Corbin Ade, MD;  Location: [facility];  Service: Endoscopy;  Laterality: N/A;  . Cardiac catheterization    . Left heart catheterization with coronary angiogram N/A 01/18/2014    Procedure: LEFT HEART CATHETERIZATION WITH CORONARY ANGIOGRAM;  Surgeon: Lennette Bihari, MD;  Location: [facility] CATH LAB;  Service: Cardiovascular;  Laterality: N/A;   Family History  Problem Relation Age of Onset  . Cancer Mother     Breast  . Colon cancer Neg Hx    History  Substance Use Topics  . Smoking status: Current Every Day Smoker -- 1.50 packs/day for 10 years    Types: Cigarettes  . Smokeless tobacco: Never Used  .  Alcohol Use: No     Comment: occassionally   vodka and orange juice    Review of Systems  Constitutional: Negative for fever and chills.  HENT: Positive for hearing loss and sore throat. Negative for congestion, ear discharge, ear pain, rhinorrhea, sinus pressure, trouble swallowing and voice change.   Eyes: Negative for discharge.  Respiratory: Negative for cough, shortness of breath, wheezing and stridor.   Cardiovascular: Negative for chest pain.  Gastrointestinal: Negative for abdominal pain.  Genitourinary: Negative.       Allergies  Review of patient's allergies indicates no known allergies.  Home Medications   Prior to Admission medications   Medication Sig Start Date End Date Taking? Authorizing Provider  benzoyl peroxide 10 % gel Place 1 application into the right ear daily as needed (for otic relief).   Yes Historical Provider, MD  carbamide peroxide (DEBROX) 6.5 % otic solution Place 5 drops into the right ear daily as needed (for otic relief).   Yes Historical Provider, MD  isopropyl alcohol 70 % external solution Place 1 application into the right ear daily as needed (for otic relief).   Yes Historical Provider, MD  amLODipine (NORVASC) 10 MG tablet Take 1 tablet (10 mg total) by mouth daily. Patient not taking: Reported on 10/11/2014 02/01/14   Elliot Cousin, MD  cephALEXin (KEFLEX) 500 MG capsule Take 1 capsule (500 mg total) by mouth 4 (four) times daily. Patient not taking: Reported on 10/11/2014 09/26/14  Rolland Porter, MD  famotidine (PEPCID) 40 MG tablet Take 1 tablet (40 mg total) by mouth daily. Patient not taking: Reported on 05/17/2014 04/01/14   Everlene Farrier, PA-C  fexofenadine-pseudoephedrine (ALLEGRA-D) 60-120 MG per tablet Take 1 tablet by mouth every 12 (twelve) hours. 10/11/14   Burgess Amor, PA-C  HYDROcodone-acetaminophen (NORCO/VICODIN) 5-325 MG per tablet Take 1 tablet by mouth every 4 (four) hours as needed. Patient not taking: Reported on 10/11/2014 09/26/14    Rolland Porter, MD  metoCLOPramide (REGLAN) 10 MG tablet Take 1 tablet (10 mg total) by mouth every 6 (six) hours. Patient not taking: Reported on 05/17/2014 02/17/14   Doug Sou, MD  pantoprazole (PROTONIX) 20 MG tablet Take 1 tablet (20 mg total) by mouth daily. Patient not taking: Reported on 05/17/2014 04/01/14   Everlene Farrier, PA-C  pantoprazole (PROTONIX) 40 MG tablet Take 1 tablet (40 mg total) by mouth 2 (two) times daily before a meal. Patient not taking: Reported on 05/17/2014 02/12/14   Tiffany Kocher, PA-C  promethazine (PHENERGAN) 25 MG tablet Take 1 tablet (25 mg total) by mouth every 8 (eight) hours as needed for nausea or vomiting. Patient not taking: Reported on 05/17/2014 02/18/14   [name], MD  sucralfate (CARAFATE) 1 G tablet Take 1 tablet (1 g total) by mouth 4 (four) times daily -  with meals and at bedtime. Patient not taking: Reported on 05/17/2014 03/09/14   [name], MD  sucralfate (CARAFATE) 1 G tablet Take 1 tablet (1 g total) by mouth 4 (four) times daily -  with meals and at bedtime. Patient not taking: Reported on 05/17/2014 04/01/14   Everlene Farrier, PA-C   BP 158/95 mmHg  Pulse 86  Temp(Src) 98.1 F (36.7 C) (Oral)  Resp 20  Ht  (1.905 m)  Wt 240 lb (108.863 kg)  BMI 30.00 kg/m2  SpO2 98% Physical Exam  Constitutional: He is oriented to person, place, and time. He appears well-developed and well-nourished.  HENT:  Head: Normocephalic and atraumatic.  Right Ear: Ear canal normal. No swelling or tenderness. No foreign bodies. No mastoid tenderness. Tympanic membrane is retracted. Tympanic membrane is not injected, not erythematous and not bulging.  Left Ear: Tympanic membrane and ear canal normal.  Nose: Mucosal edema and rhinorrhea present.  Mouth/Throat: Uvula is midline, oropharynx is clear and moist and mucous membranes are normal. No oropharyngeal exudate, posterior oropharyngeal edema, posterior oropharyngeal erythema or tonsillar abscesses.   Small amount of cerumen flushed by RN from the ear canal easily, was partially obstructing TM, did not improve hearing. TM has some old appearing scarring, no erythema.  Retracted.  Eyes: Conjunctivae are normal.  Cardiovascular: Normal rate and normal heart sounds.   Pulmonary/Chest: Effort normal. No respiratory distress. He has no wheezes. He has no rales.  Abdominal: Soft. There is no tenderness.  Musculoskeletal: Normal range of motion.  Neurological: He is alert and oriented to person, place, and time.  Skin: Skin is warm and dry. No rash noted.  Psychiatric: He has a normal mood and affect.    ED Course  Procedures (including critical care time) Labs Review Labs Reviewed - No data to display  Imaging Review No results found.   EKG Interpretation None      MDM   Final diagnoses:  Eustachian tube dysfunction, right    Suspect eustachian tube dysfunction, prescribed allegra d, referral to ENT for further evaluation.  Pt understands to call for f/u with Dr. Suszanne Conners if sx persist  or do not improve with tx.    Burgess Amor, PA-C 10/13/14 2154  Geoffery Lyons, MD 10/14/14 [phone]

## 2014-10-16 ENCOUNTER — Telehealth: Payer: Self-pay | Admitting: Family Medicine

## 2014-10-16 MED ORDER — SULFAMETHOXAZOLE-TRIMETHOPRIM 800-160 MG PO TABS: 1.0000 | ORAL_TABLET | Freq: Two times a day (BID) | ORAL | Status: DC

## 2014-10-16 NOTE — Telephone Encounter (Signed)
Left message on voicemail notifying patient called in Bactrim DS 1 twice a day for 10 days. If for rash or allergy occurs with medication stop medicine. If reoccurrence we will need to see him. Let him know that we can work with him in regards to any cost. If the area gets worse needs to be seen.

## 2014-10-16 NOTE — Telephone Encounter (Signed)
Pt states he has no insurance at the time, went to the ED  For a boil about two weeks ago, now has one on his head.  Wants to know if we can call him in an antibiotic? Does not want To keep running up bills between here an the hospital.  [place] [facility]

## 2014-10-16 NOTE — Telephone Encounter (Signed)
May call in Bactrim DS 1 twice a day for 10 days. If for rash or allergy occurs with medication stop medicine. If reoccurrence we will need to see him. Let him know that we can work with him in regards to any cost. If the area gets worse needs to be seen.

## 2015-04-21 ENCOUNTER — Encounter (HOSPITAL_COMMUNITY): Payer: Self-pay

## 2015-04-21 ENCOUNTER — Emergency Department (HOSPITAL_COMMUNITY)
Admission: EM | Admit: 2015-04-21 | Discharge: 2015-04-21 | Disposition: A | Discharge status: Home / Self Care | Payer: 59 | Attending: Emergency Medicine | Admitting: Emergency Medicine

## 2015-04-21 DIAGNOSIS — G43909 Migraine, unspecified, not intractable, without status migrainosus: Secondary | ICD-10-CM | POA: Insufficient documentation

## 2015-04-21 DIAGNOSIS — Z720 Tobacco use: Secondary | ICD-10-CM | POA: Insufficient documentation

## 2015-04-21 DIAGNOSIS — Y998 Other external cause status: Secondary | ICD-10-CM | POA: Insufficient documentation

## 2015-04-21 DIAGNOSIS — S3992XA Unspecified injury of lower back, initial encounter: Secondary | ICD-10-CM | POA: Insufficient documentation

## 2015-04-21 DIAGNOSIS — Z87442 Personal history of urinary calculi: Secondary | ICD-10-CM | POA: Insufficient documentation

## 2015-04-21 DIAGNOSIS — Z9889 Other specified postprocedural states: Secondary | ICD-10-CM | POA: Insufficient documentation

## 2015-04-21 DIAGNOSIS — Y9389 Activity, other specified: Secondary | ICD-10-CM | POA: Insufficient documentation

## 2015-04-21 DIAGNOSIS — M545 Low back pain, unspecified: Secondary | ICD-10-CM

## 2015-04-21 DIAGNOSIS — Z8719 Personal history of other diseases of the digestive system: Secondary | ICD-10-CM | POA: Insufficient documentation

## 2015-04-21 DIAGNOSIS — I10 Essential (primary) hypertension: Secondary | ICD-10-CM | POA: Insufficient documentation

## 2015-04-21 DIAGNOSIS — Y9289 Other specified places as the place of occurrence of the external cause: Secondary | ICD-10-CM | POA: Insufficient documentation

## 2015-04-21 DIAGNOSIS — X500XXA Overexertion from strenuous movement or load, initial encounter: Secondary | ICD-10-CM | POA: Insufficient documentation

## 2015-04-21 MED ORDER — OXYCODONE-ACETAMINOPHEN 5-325 MG PO TABS: 1.0000 | ORAL_TABLET | Freq: Three times a day (TID) | ORAL | Status: DC | PRN

## 2015-04-21 MED ORDER — OXYCODONE-ACETAMINOPHEN 5-325 MG PO TABS
1.0000 | ORAL_TABLET | Freq: Once | ORAL | Status: AC
  Administered 2015-04-21: 1 via ORAL
  Filled 2015-04-21: qty 1

## 2015-04-21 NOTE — ED Notes (Signed)
Pt reports picked up a telephone pole a few days ago and c/o pain in lower back since then.

## 2015-04-21 NOTE — ED Provider Notes (Signed)
CSN: [id number]     Arrival date & time 04/21/15  1233 History  By signing my name below, I, Gwenyth Ober, attest that this documentation has been prepared under the direction and in the presence of Zadie Rhine, MD.  Electronically Signed: Gwenyth Ober, ED Scribe. 04/21/2015. 12:56 PM.   Chief Complaint  Patient presents with  . Back Pain   Patient is a 39 y.o. male presenting with back pain. The history is provided by the patient. No language interpreter was used.  Back Pain Location:  Lumbar spine Quality: throbbing. Pain severity:  Moderate Onset quality:  Gradual Duration:  3 days Timing:  Constant Progression:  Worsening Chronicity:  New Context: lifting heavy objects   Worsened by:  Sitting Ineffective treatments:  Being still and heating pad Associated symptoms: no abdominal pain, no bladder incontinence, no bowel incontinence, no chest pain, no fever and no weakness     HPI Comments: Nicolas [name] is a 39 y.o. male who presents to the Emergency Department complaining of gradual onset, moderate, throbbing lower back pain that started 3 days ago. His pain becomes worse with sitting. He has tried heat and rest with no relief. Pt reports onset of pain started after he picked up a telephone pole from the ground and felt a pop in his lower back. He denies weakness in his legs, bladder or bowel incontinence, abdominal pain, CP and fever.    Past Medical History  Diagnosis Date  . Hypertension   . Pancreatitis   . Kidney stone     bil , different times  passed stones  . Migraines    Past Surgical History  Procedure Laterality Date  . Cardiac catheterization  Aug 2015    normal EF, normal coronaries  . Esophagogastroduodenoscopy (egd) with propofol N/A 02/11/2014    Procedure: ESOPHAGOGASTRODUODENOSCOPY (EGD) WITH PROPOFOL;  Surgeon: Corbin Ade, MD;  Location: [facility];  Service: Endoscopy;  Laterality: N/A;  . Esophageal biopsy N/A 02/11/2014    Procedure: BIOPSY;   Surgeon: Corbin Ade, MD;  Location: [facility];  Service: Endoscopy;  Laterality: N/A;  . Cardiac catheterization    . Left heart catheterization with coronary angiogram N/A 01/18/2014    Procedure: LEFT HEART CATHETERIZATION WITH CORONARY ANGIOGRAM;  Surgeon: Lennette Bihari, MD;  Location: [facility] CATH LAB;  Service: Cardiovascular;  Laterality: N/A;   Family History  Problem Relation Age of Onset  . Cancer Mother     Breast  . Colon cancer Neg Hx    Social History  Substance Use Topics  . Smoking status: Current Every Day Smoker -- 1.50 packs/day for 10 years    Types: Cigarettes  . Smokeless tobacco: Never Used  . Alcohol Use: No     Comment: occassionally   vodka and orange juice    Review of Systems  Constitutional: Negative for fever.  Cardiovascular: Negative for chest pain.  Gastrointestinal: Negative for abdominal pain and bowel incontinence.  Genitourinary: Negative for bladder incontinence.  Musculoskeletal: Positive for back pain.  Neurological: Negative for weakness.  All other systems reviewed and are negative.  Allergies  Review of patient's allergies indicates no known allergies.  Home Medications   Prior to Admission medications   Medication Sig Start Date End Date Taking? Authorizing Provider  amLODipine (NORVASC) 10 MG tablet Take 1 tablet (10 mg total) by mouth daily. Patient not taking: Reported on 10/11/2014 02/01/14   Elliot Cousin, MD  benzoyl peroxide 10 % gel Place 1 application into the right  ear daily as needed (for otic relief).    Historical Provider, MD  carbamide peroxide (DEBROX) 6.5 % otic solution Place 5 drops into the right ear daily as needed (for otic relief).    Historical Provider, MD  cephALEXin (KEFLEX) 500 MG capsule Take 1 capsule (500 mg total) by mouth 4 (four) times daily. Patient not taking: Reported on 10/11/2014 09/26/14   Rolland Porter, MD  famotidine (PEPCID) 40 MG tablet Take 1 tablet (40 mg total) by mouth daily. Patient not taking:  Reported on 05/17/2014 04/01/14   Everlene Farrier, PA-C  fexofenadine-pseudoephedrine (ALLEGRA-D) 60-120 MG per tablet Take 1 tablet by mouth every 12 (twelve) hours. 10/11/14   Burgess Amor, PA-C  HYDROcodone-acetaminophen (NORCO/VICODIN) 5-325 MG per tablet Take 1 tablet by mouth every 4 (four) hours as needed. Patient not taking: Reported on 10/11/2014 09/26/14   Rolland Porter, MD  isopropyl alcohol 70 % external solution Place 1 application into the right ear daily as needed (for otic relief).    Historical Provider, MD  metoCLOPramide (REGLAN) 10 MG tablet Take 1 tablet (10 mg total) by mouth every 6 (six) hours. Patient not taking: Reported on 05/17/2014 02/17/14   Doug Sou, MD  pantoprazole (PROTONIX) 20 MG tablet Take 1 tablet (20 mg total) by mouth daily. Patient not taking: Reported on 05/17/2014 04/01/14   Everlene Farrier, PA-C  pantoprazole (PROTONIX) 40 MG tablet Take 1 tablet (40 mg total) by mouth 2 (two) times daily before a meal. Patient not taking: Reported on 05/17/2014 02/12/14   Tiffany Kocher, PA-C  promethazine (PHENERGAN) 25 MG tablet Take 1 tablet (25 mg total) by mouth every 8 (eight) hours as needed for nausea or vomiting. Patient not taking: Reported on 05/17/2014 02/18/14   [name], MD  sucralfate (CARAFATE) 1 G tablet Take 1 tablet (1 g total) by mouth 4 (four) times daily -  with meals and at bedtime. Patient not taking: Reported on 05/17/2014 03/09/14   [name], MD  sucralfate (CARAFATE) 1 G tablet Take 1 tablet (1 g total) by mouth 4 (four) times daily -  with meals and at bedtime. Patient not taking: Reported on 05/17/2014 04/01/14   Everlene Farrier, PA-C  sulfamethoxazole-trimethoprim (BACTRIM DS,SEPTRA DS) 800-160 MG per tablet Take 1 tablet by mouth 2 (two) times daily. For 10 days 10/16/14   [name], MD   BP 197/111 mmHg  Pulse 101  Temp(Src) 98.1 F (36.7 C) (Oral)  Resp 14  Ht  (1.905 m)  Wt 240 lb (108.863 kg)  BMI 30.00 kg/m2  SpO2 99% Physical  Exam  Nursing note and vitals reviewed. CONSTITUTIONAL: Well developed/well nourished HEAD: Normocephalic/atraumatic EYES: EOMI ENMT: Mucous membranes moist NECK: supple no meningeal signs SPINE/BACK:entire spine nontender; tenderness to left paraspinal region CV: S1/S2 noted, no murmurs/rubs/gallops noted LUNGS: Lungs are clear to auscultation bilaterally, no apparent distress ABDOMEN: soft, nontender, no rebound or guarding GU:no cva tenderness NEURO: Awake/alert, equal motor 5/5 strength noted with the following: hip flexion/knee flexion/extension, foot dorsi/plantar flexion, great toe extension intact bilaterally, no sensory deficit in any dermatome.  Equal patellar/achilles reflex noted (2+) in bilateral lower extremities.  Pt is able to ambulate unassisted. EXTREMITIES: pulses normal, full ROM SKIN: warm, color normal PSYCH: no abnormalities of mood noted, alert and oriented to situation  ED Course  Procedures  DIAGNOSTIC STUDIES: Oxygen Saturation is 99% on RA, normal by my interpretation.    COORDINATION OF CARE: 12:57 PM Discussed suspicion for muscle strain and  treatment plan with pt which includes Vicodin. Advised pt to return with onset of leg weakness or incontinence. Pt agreed to plan.  Pt well appearing No neuro deficits He has h/o ulcer disease, avoid NSAIDs, percocet given  Medications  oxyCODONE-acetaminophen (PERCOCET/ROXICET) 5-325 MG per tablet 1 tablet (1 tablet Oral Given 04/21/15 1308)     MDM   Final diagnoses:  Left-sided low back pain without sciatica    Nursing notes including past medical history and social history reviewed and considered in documentation   I personally performed the services described in this documentation, which was scribed in my presence. The recorded information has been reviewed and is accurate.       Zadie Rhine, MD 04/21/15 1322

## 2015-04-21 NOTE — ED Notes (Signed)
EDP in to evaluate 

## 2015-04-21 NOTE — Discharge Instructions (Signed)

## 2015-08-11 IMAGING — CT CT ABD-PELV W/ CM
2 of 4 series · 16 of 46 positions shown, 18 images · IV contrast (Omnipaque 300)
Comparison: 01/30/2014

CLINICAL DATA: ABDOMINAL PAIN EMESIS

EXAM:
CT ABDOMEN AND PELVIS WITH CONTRAST
TECHNIQUE: Multidetector CT imaging of the abdomen and pelvis was performed
using the standard protocol following bolus administration of
intravenous contrast.
CONTRAST:  100mL OMNIPAQUE IOHEXOL 300 MG/ML  SOLN

[Series 2: abd_pel_with 5.0 b40f · axial · 0.77mm/px · z∈[-540,-95]mm · 13 of 99 slices shown, 15 images]
[im 5/99  soft-tissue]
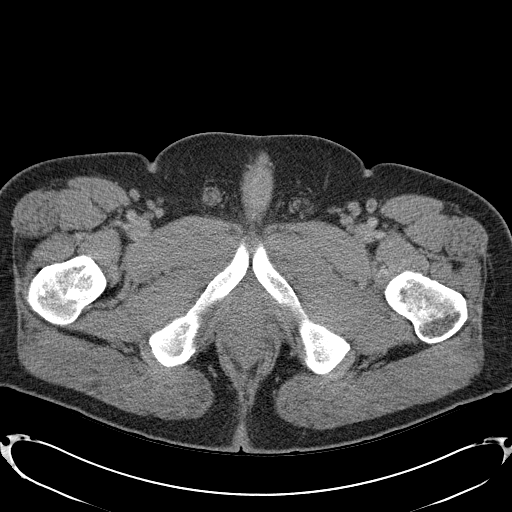
[im 5/99  bone]
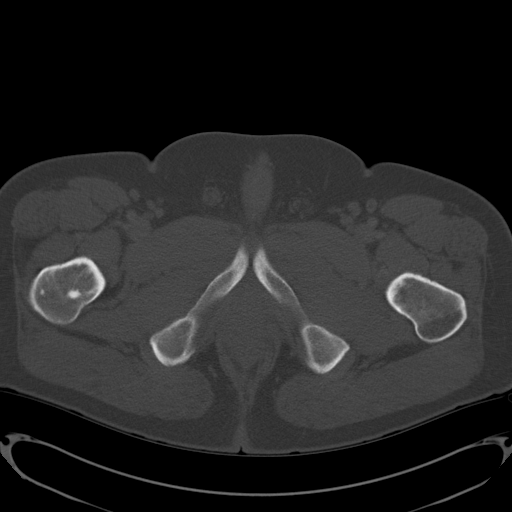
[im 13/99  soft-tissue]
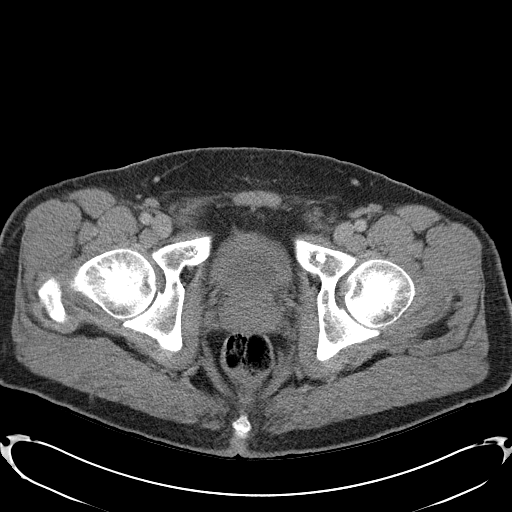
[im 21/99  soft-tissue]
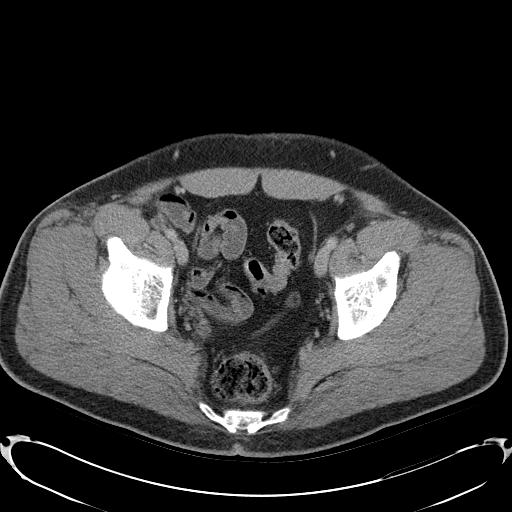
[im 29/99  soft-tissue]
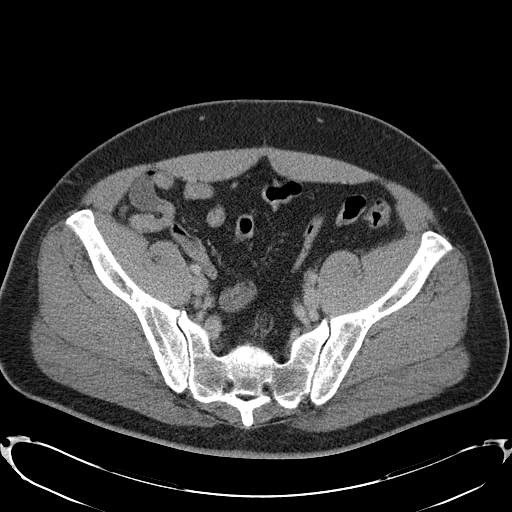
[im 33/99  soft-tissue]
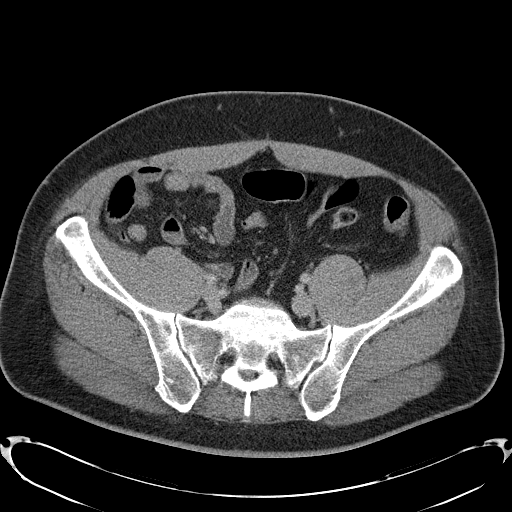
[im 41/99  soft-tissue]
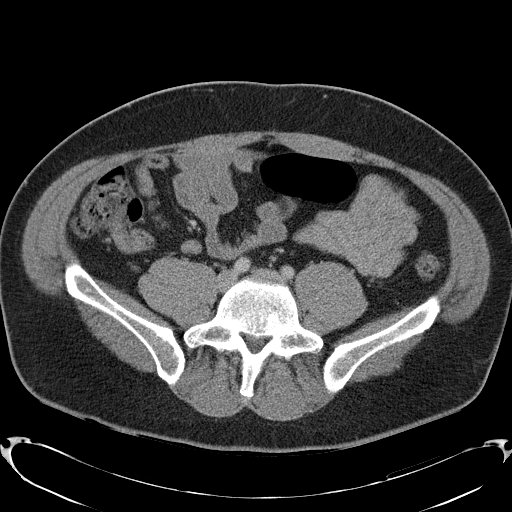
[im 50/99  soft-tissue]
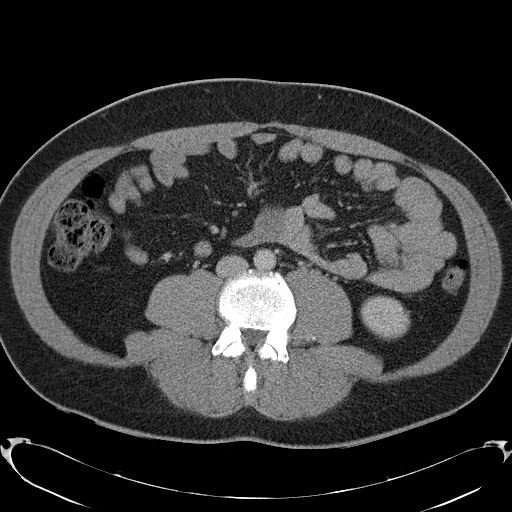
[im 58/99  soft-tissue]
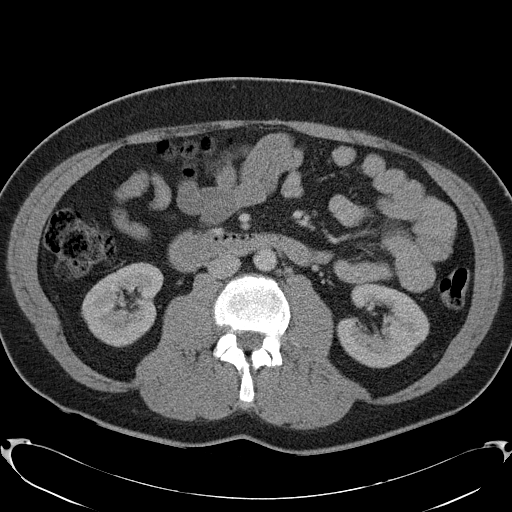
[im 66/99  soft-tissue]
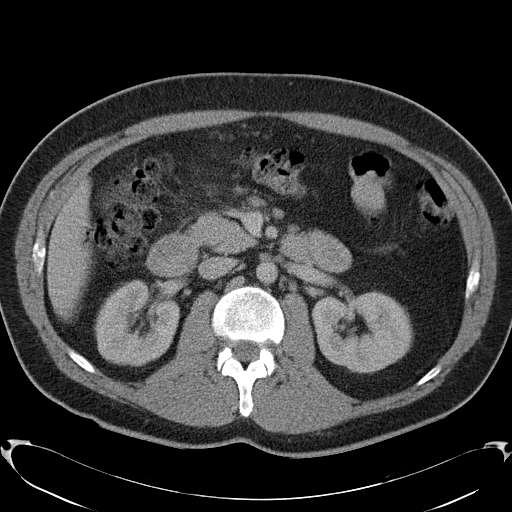
[im 66/99  bone]
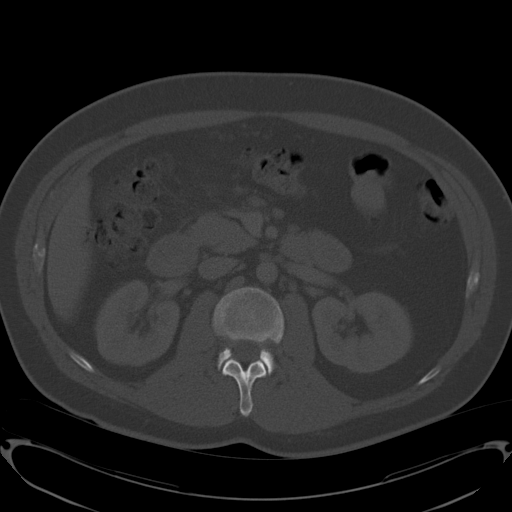
[im 70/99  soft-tissue]
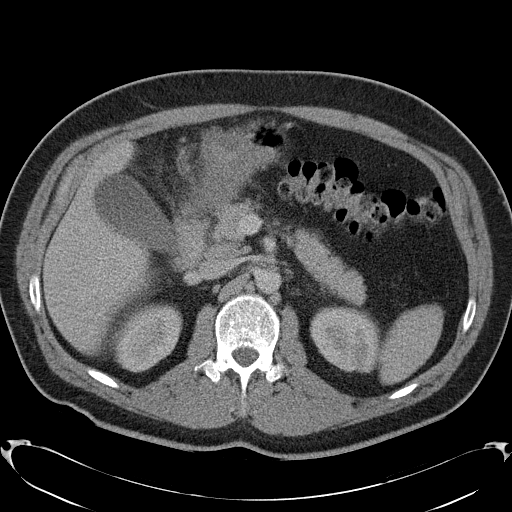
[im 78/99  soft-tissue]
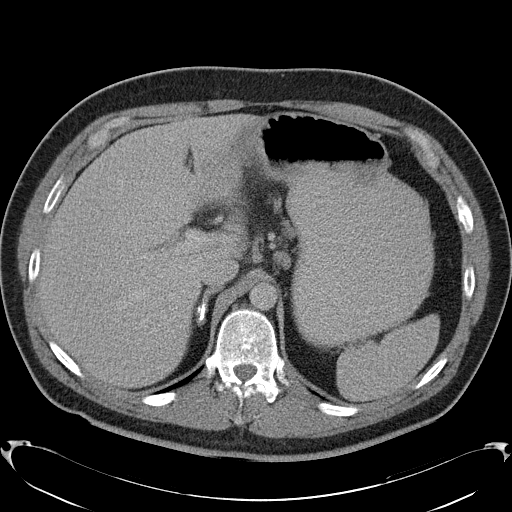
[im 86/99  soft-tissue]
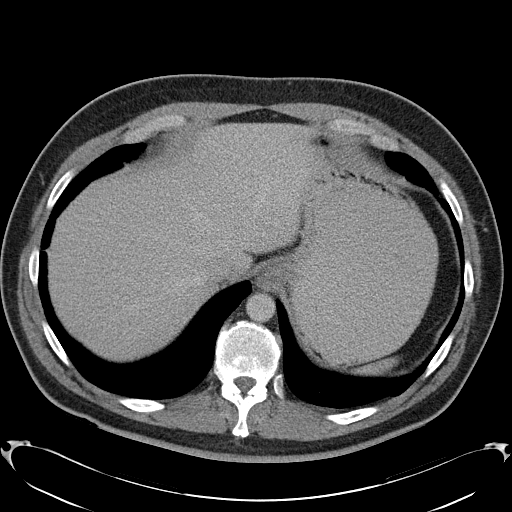
[im 94/99  soft-tissue]
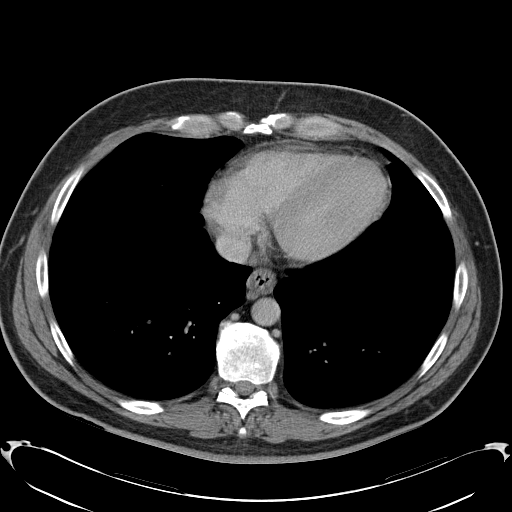

[Series 4: abd_pel_with 3.0 spo · coronal · 0.78mm/px · 3 of 97 slices shown]
[im 33/97  soft-tissue]
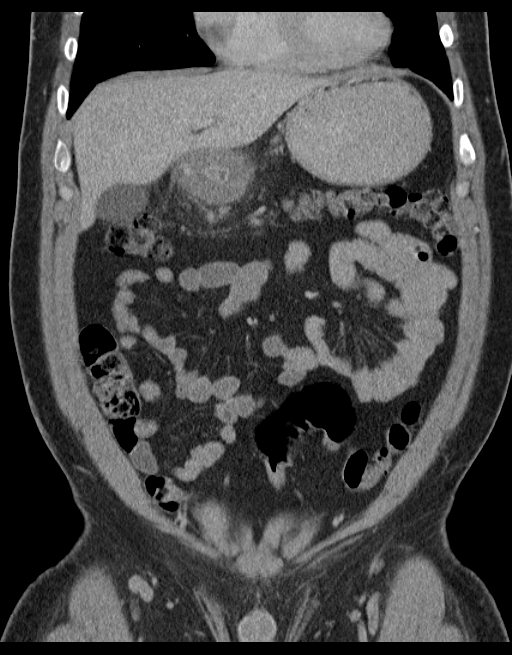
[im 43/97  soft-tissue]
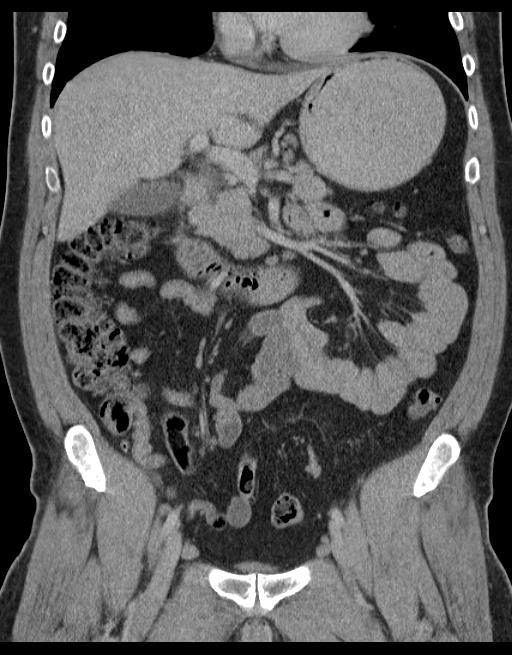
[im 54/97  soft-tissue]
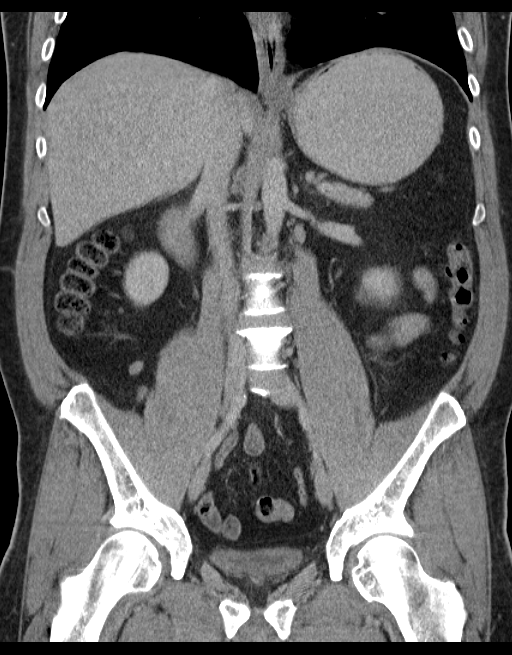

[16 of 46 positions shown; findings below may reference images not displayed]

FINDINGS: Visualized lung bases clear.

Some increase in inflammatory/edematous changes between the
gallbladder and pancreatic head, inferior to the pylorus. Wall
thickening in the gastric antrum will is suspected. There is a focal
outpouching from the gastric lumen along the greater curvature
suggesting ulceration. Regional prominent lymph nodes measuring up
to 13 mm short axis diameter image [DATE]. No focal fluid collection
or free air.

Unremarkable liver, spleen, kidneys. Chronic will will Linear coarse
calcifications in both adrenal glands as before. There is
homogeneous pancreatic parenchymal enhancement without ductal
dilatation. Unremarkable aorta and portal vein. Stomach is
physiologically distended. Small bowel and colon are nondilated.
Normal appendix. Urinary bladder nondistended. Bilateral pelvic
phleboliths. No ascites. Subcentimeter right lower quadrant
mesenteric, left para-aortic, aortocaval, and central mesenteric
lymph nodes. Lumbar spine unremarkable.
IMPRESSION: 1. Progressive wall thickening in the gastric antrum with focal
ulceration suggesting peptic ulcer disease versus ulcerated gastric
carcinoma. Recommend endoscopy for further evaluation.
2. Inflammatory/edematous changes inferior to the pylorus with
regional adenopathy, likely secondary to the gastric process. No
evidence of perforation or abscess.

## 2015-08-19 IMAGING — CR DG ABDOMEN ACUTE W/ 1V CHEST
4 series · 4 of 4 positions shown · non-contrast
Comparison: 01/19/2014

CLINICAL DATA: Epigastric pain and tenderness.  Severe vomiting.

EXAM:
ACUTE ABDOMEN SERIES (ABDOMEN 2 VIEW & CHEST 1 VIEW)

[view not recorded (1 of 4)]
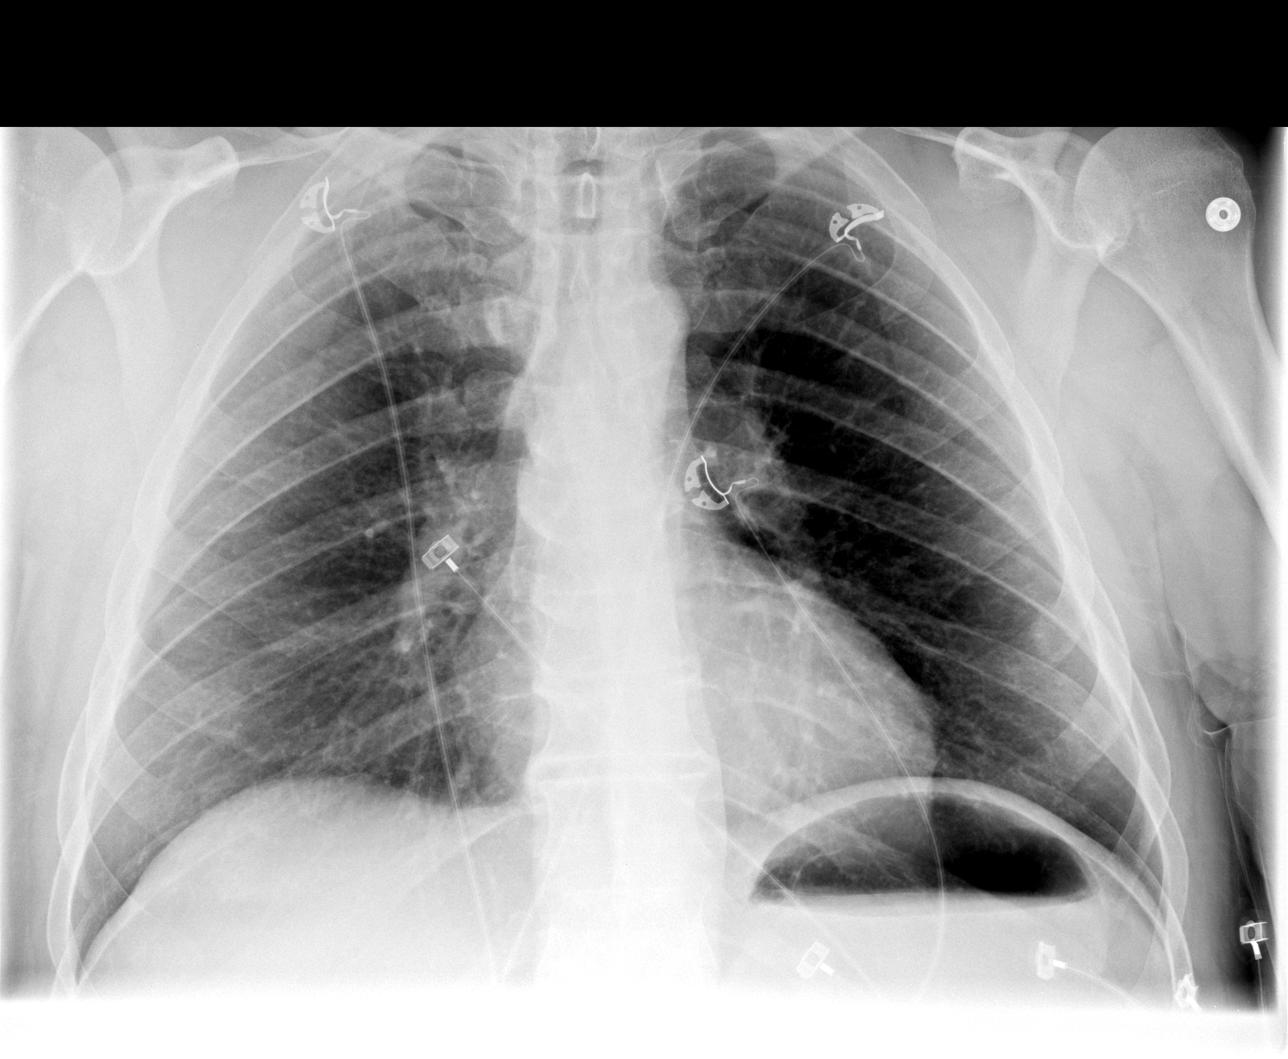

[view not recorded (2 of 4)]
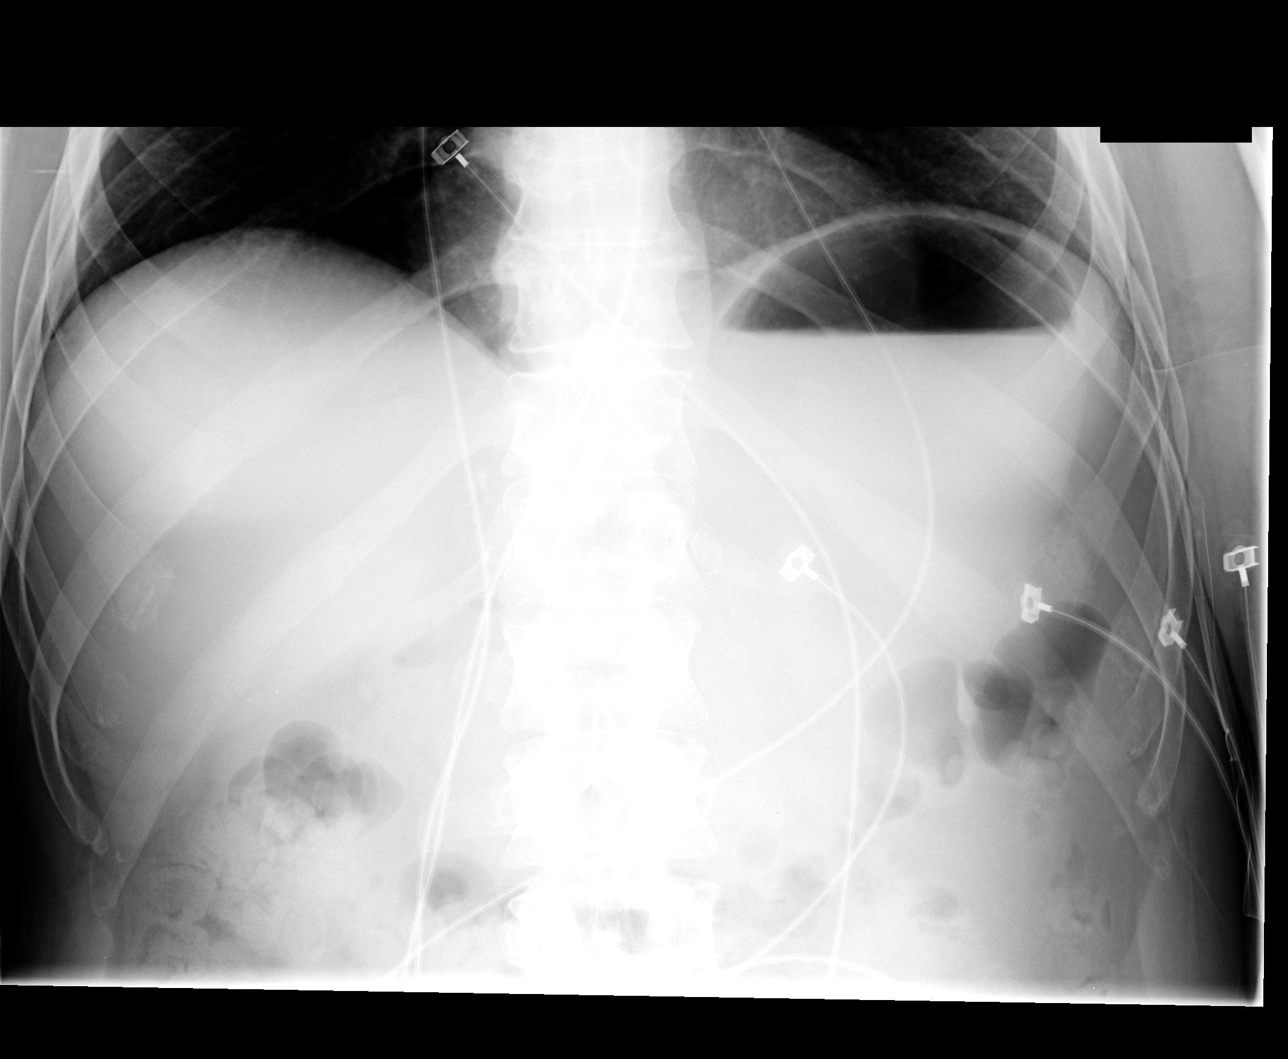

[view not recorded (3 of 4)]
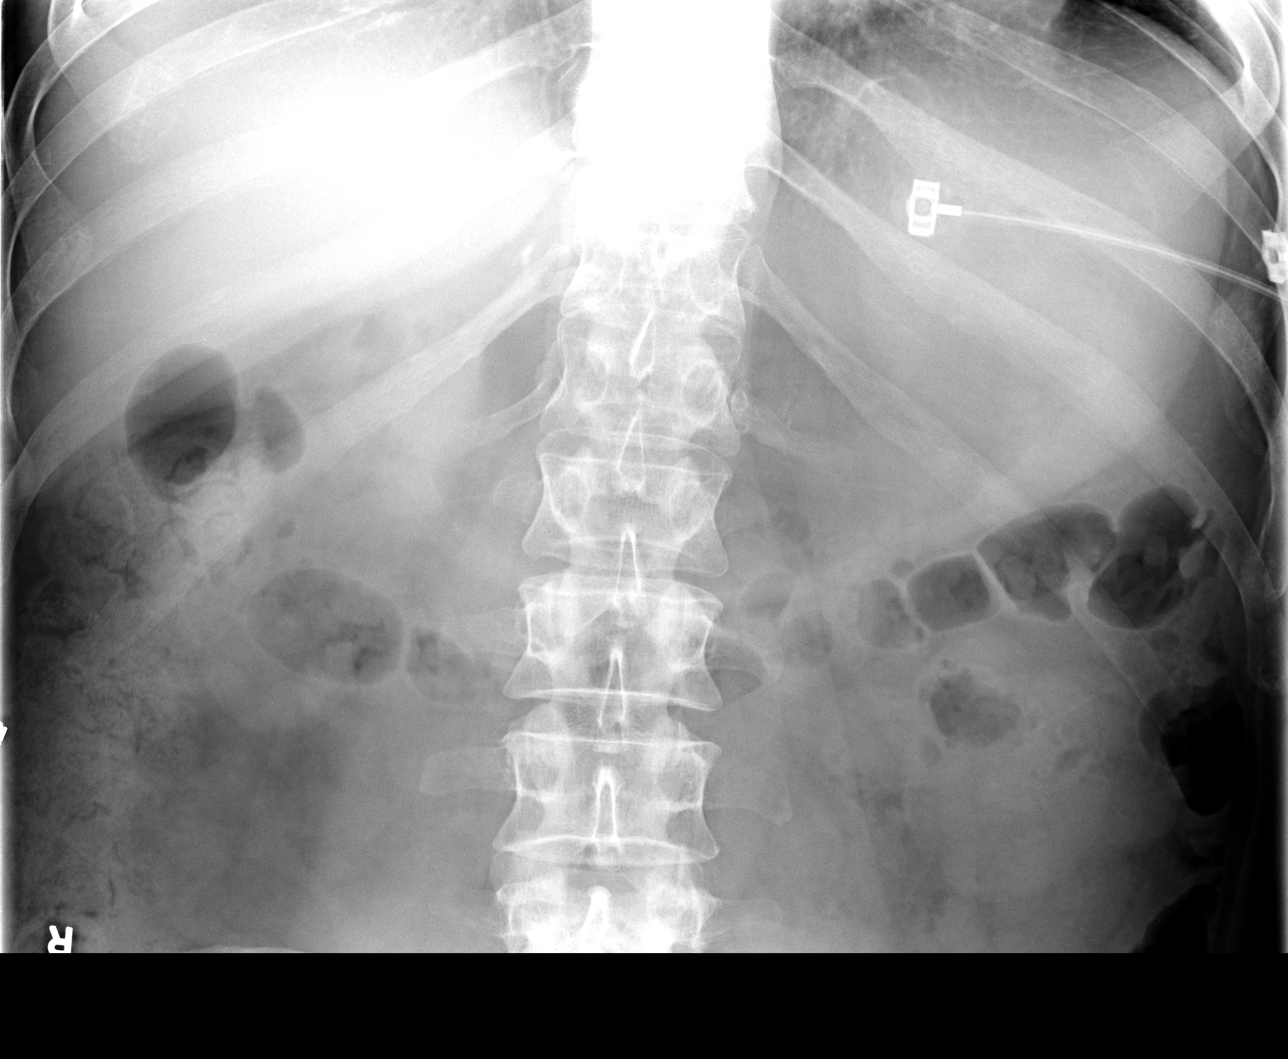

[view not recorded (4 of 4)]
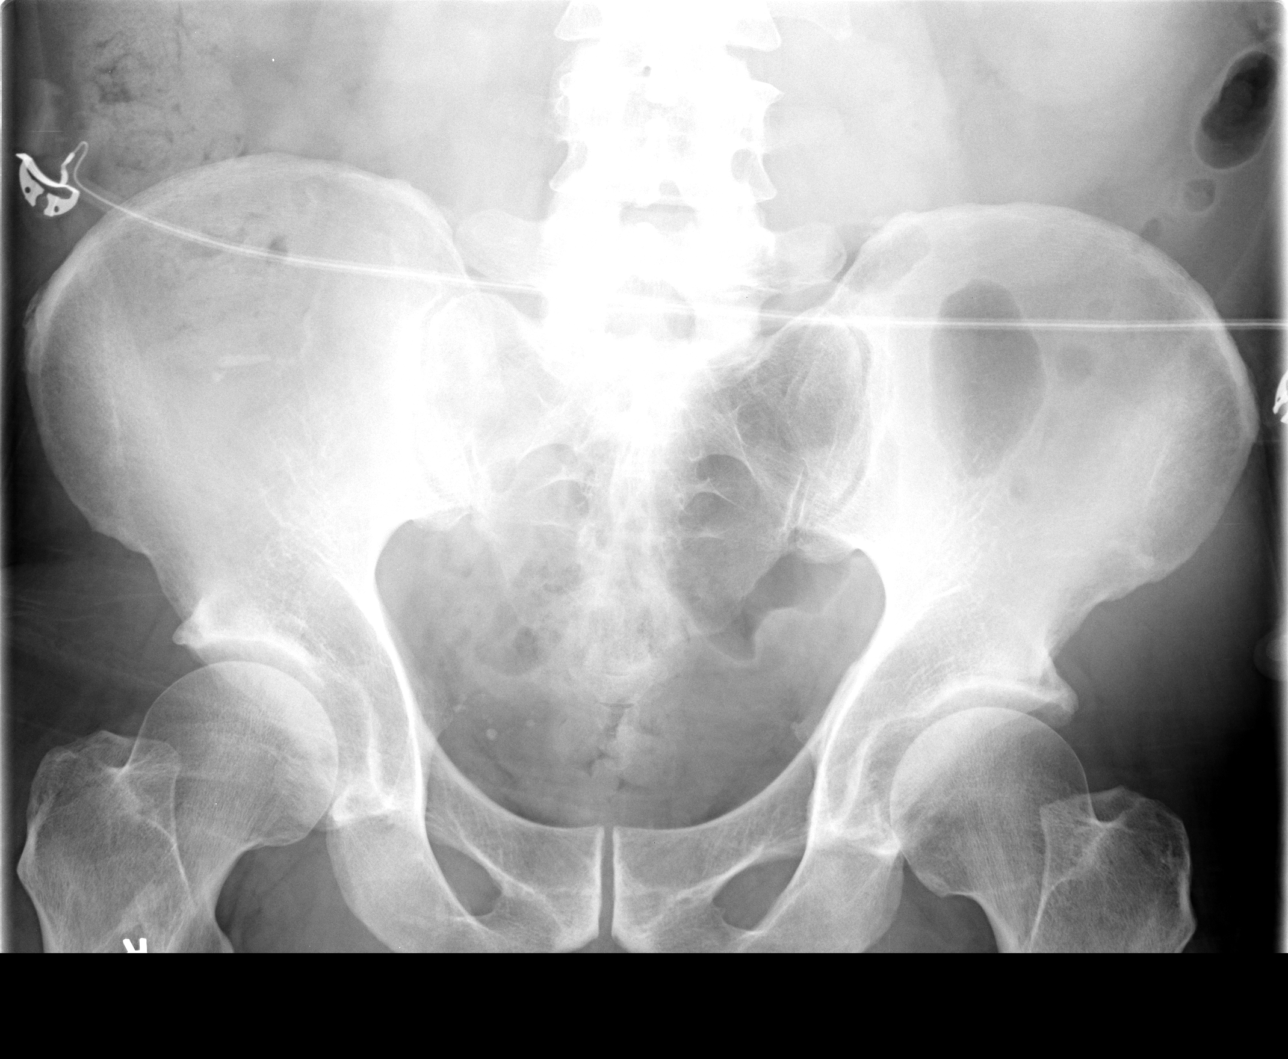

[4 of 4 positions shown; findings below may reference images not displayed]

FINDINGS: There is no evidence of dilated bowel loops or free intraperitoneal
air. No radiopaque calculi or other significant radiographic
abnormality is seen. Heart size and mediastinal contours are within
normal limits. Both lungs are clear.
IMPRESSION: Negative abdominal radiographs.  No acute cardiopulmonary disease.

## 2015-09-18 ENCOUNTER — Emergency Department (HOSPITAL_COMMUNITY)
Admission: EM | Admit: 2015-09-18 | Discharge: 2015-09-18 | Disposition: A | Discharge status: Home / Self Care | Payer: Self-pay | Attending: Emergency Medicine | Admitting: Emergency Medicine

## 2015-09-18 ENCOUNTER — Encounter (HOSPITAL_COMMUNITY): Payer: Self-pay | Admitting: Emergency Medicine

## 2015-09-18 DIAGNOSIS — I10 Essential (primary) hypertension: Secondary | ICD-10-CM | POA: Insufficient documentation

## 2015-09-18 DIAGNOSIS — G510 Bell's palsy: Secondary | ICD-10-CM

## 2015-09-18 DIAGNOSIS — F17210 Nicotine dependence, cigarettes, uncomplicated: Secondary | ICD-10-CM | POA: Insufficient documentation

## 2015-09-18 HISTORY — DX: Personal history of other diseases of the digestive system: Z87.19

## 2015-09-18 HISTORY — DX: Personal history of peptic ulcer disease: Z87.11

## 2015-09-18 MED ORDER — PREDNISONE 50 MG PO TABS
60.0000 mg | ORAL_TABLET | Freq: Once | ORAL | Status: AC
  Administered 2015-09-18: 60 mg via ORAL
  Filled 2015-09-18: qty 1

## 2015-09-18 MED ORDER — VALACYCLOVIR HCL 1 G PO TABS: 1000.0000 mg | ORAL_TABLET | Freq: Three times a day (TID) | ORAL | Status: AC

## 2015-09-18 MED ORDER — VALACYCLOVIR HCL 500 MG PO TABS
1000.0000 mg | ORAL_TABLET | Freq: Once | ORAL | Status: AC
  Administered 2015-09-18: 1000 mg via ORAL
  Filled 2015-09-18: qty 2

## 2015-09-18 MED ORDER — IBUPROFEN 400 MG PO TABS
600.0000 mg | ORAL_TABLET | Freq: Once | ORAL | Status: AC
  Administered 2015-09-18: 600 mg via ORAL
  Filled 2015-09-18: qty 2

## 2015-09-18 MED ORDER — PREDNISONE 20 MG PO TABS: 40.0000 mg | ORAL_TABLET | Freq: Every day | ORAL | Status: DC

## 2015-09-18 NOTE — ED Notes (Signed)
EDP at bedside  

## 2015-09-18 NOTE — ED Notes (Addendum)
Patient c/o left facial drooping. Per patient noticed drooping this morning when he woke up and went to smoke. Per patient unable to blink left eye. Clear drainage from eye. Per patient had cold x2 days. Denies any weakness in extremities. EDP made aware and to room to evaluate patient.

## 2015-09-18 NOTE — ED Provider Notes (Signed)
CSN: [id number]     Arrival date & time 09/18/15  1336 History   First MD Initiated Contact with Patient 09/18/15 1350     Chief Complaint  Patient presents with  . Facial Droop    An emergency department physician performed an initial assessment on this suspected stroke patient at 1345. (Consider location/radiation/quality/duration/timing/severity/associated sxs/prior Treatment) HPI   39yM with L facial weakness. Was smoking cigarette around 0800 today when had hard time keeping it in his mouth. Persistent since then. Cannot close L eye. Keeps watering. No other acute complaints. No change in visual acuity. No numbness, tingling or focal loss of strength otherwise. No hx of similr symptoms. No change in hearing. No change in taste.   Past Medical History  Diagnosis Date  . Hypertension   . Pancreatitis   . Kidney stone     bil , different times  passed stones  . Migraines   . History of stomach ulcers    Past Surgical History  Procedure Laterality Date  . Cardiac catheterization  Aug 2015    normal EF, normal coronaries  . Esophagogastroduodenoscopy (egd) with propofol N/A 02/11/2014    Procedure: ESOPHAGOGASTRODUODENOSCOPY (EGD) WITH PROPOFOL;  Surgeon: Corbin Ade, MD;  Location: [facility];  Service: Endoscopy;  Laterality: N/A;  . Biopsy N/A 02/11/2014    Procedure: BIOPSY;  Surgeon: Corbin Ade, MD;  Location: [facility];  Service: Endoscopy;  Laterality: N/A;  . Cardiac catheterization    . Left heart catheterization with coronary angiogram N/A 01/18/2014    Procedure: LEFT HEART CATHETERIZATION WITH CORONARY ANGIOGRAM;  Surgeon: Lennette Bihari, MD;  Location: [facility] CATH LAB;  Service: Cardiovascular;  Laterality: N/A;   Family History  Problem Relation Age of Onset  . Cancer Mother     Breast  . Colon cancer Neg Hx    Social History  Substance Use Topics  . Smoking status: Current Every Day Smoker -- 1.50 packs/day for 10 years    Types: Cigarettes  . Smokeless tobacco: Never  Used  . Alcohol Use: No    Review of Systems  All systems reviewed and negative, other than as noted in HPI.   Allergies  Review of patient's allergies indicates no known allergies.  Home Medications   Prior to Admission medications   Medication Sig Start Date End Date Taking? Authorizing Provider  benzoyl peroxide 10 % gel Place 1 application into the right ear daily as needed (for otic relief).    Historical Provider, MD  carbamide peroxide (DEBROX) 6.5 % otic solution Place 5 drops into the right ear daily as needed (for otic relief).    Historical Provider, MD  fexofenadine-pseudoephedrine (ALLEGRA-D) 60-120 MG per tablet Take 1 tablet by mouth every 12 (twelve) hours. 10/11/14   Burgess Amor, PA-C  isopropyl alcohol 70 % external solution Place 1 application into the right ear daily as needed (for otic relief).    Historical Provider, MD  oxyCODONE-acetaminophen (PERCOCET/ROXICET) 5-325 MG tablet Take 1 tablet by mouth every 8 (eight) hours as needed for severe pain. 04/21/15   Zadie Rhine, MD  sulfamethoxazole-trimethoprim (BACTRIM DS,SEPTRA DS) 800-160 MG per tablet Take 1 tablet by mouth 2 (two) times daily. For 10 days 10/16/14   [name], MD   BP 183/107 mmHg  Pulse 116  Temp(Src) 100.5 F (38.1 C) (Oral)  Resp 18  Ht 6\' 3"  (1.905 m)  Wt 248 lb (112.492 kg)  BMI 31.00 kg/m2  SpO2 99% Physical Exam  Constitutional: He  is oriented to person, place, and time. He appears well-developed and well-nourished. No distress.  HENT:  Head: Normocephalic and atraumatic.  Eyes: Conjunctivae are normal. Right eye exhibits no discharge. Left eye exhibits no discharge.  Neck: Neck supple.  Cardiovascular: Normal rate, regular rhythm and normal heart sounds.  Exam reveals no gallop and no friction rub.   No murmur heard. Pulmonary/Chest: Effort normal and breath sounds normal. No respiratory distress.  Abdominal: Soft. He exhibits no distension. There is no tenderness.   Musculoskeletal: He exhibits no edema or tenderness.  Neurological: He is alert and oriented to person, place, and time. He exhibits normal muscle tone. Coordination normal.  L seventh CN palsy  Skin: Skin is warm and dry.  Psychiatric: He has a normal mood and affect. His behavior is normal. Thought content normal.  Nursing note and vitals reviewed.   ED Course  Procedures (including critical care time) Labs Review Labs Reviewed - No data to display  Imaging Review No results found. I have personally reviewed and evaluated these images and lab results as part of my medical decision-making.   EKG Interpretation None      MDM   Final diagnoses:  Facial nerve palsy    39yM with Bell's palsy. L forehead is not spared. Consistent with peripheral nerve issue. Doubt CVA. Prednisone. Valtrex. PRN eye drops. Noted to be febrile. Not sure of significance of this. Addressed BP. Follow-up with PCP to discuss further.     Raeford Razor, MD 09/18/15 1409

## 2015-09-18 NOTE — Discharge Instructions (Signed)
Bell Palsy °Bell palsy is a condition in which the muscles on one side of the face become paralyzed. This often causes one side of the face to droop. It is a common condition and most people recover completely. °RISK FACTORS °Risk factors for Bell palsy include: °· Pregnancy. °· Diabetes. °· An infection by a virus, such as infections that cause cold sores. °CAUSES  °Bell palsy is caused by damage to or inflammation of a nerve in your face. It is unclear why this happens, but an infection by a virus may lead to it. Most of the time the reason it happens is unknown. °SIGNS AND SYMPTOMS  °Symptoms can range from mild to severe and can take place over a number of hours. Symptoms may include: °· Being unable to: °¨ Raise one or both eyebrows. °¨ Close one or both eyes. °¨ Feel parts of your face (facial numbness). °· Drooping of the eyelid and corner of the mouth. °· Weakness in the face. °· Paralysis of half your face. °· Loss of taste. °· Sensitivity to loud noises. °· Difficulty chewing. °· Tearing up of the affected eye. °· Dryness in the affected eye. °· Drooling. °· Pain behind one ear. °DIAGNOSIS  °Diagnosis of Bell palsy may include: °· A medical history and physical exam. °· An MRI. °· A CT scan. °· Electromyography (EMG). This is a test that checks how your nerves are working. °TREATMENT  °Treatment may include antiviral medicine to help shorten the length of the condition. Sometimes treatment is not needed and the symptoms go away on their own. °HOME CARE INSTRUCTIONS  °· Take medicines only as directed by your health care provider. °· Do facial massages and exercises as directed by your health care provider. °· If your eye is affected: °¨ Use moisturizing eye drops to prevent drying of your eye as directed by your health care provider. °¨ Protect your eye as directed by your health care provider. °SEEK MEDICAL CARE IF: °· Your symptoms do not get better or get worse. °· You are drooling. °· Your eye is red,  irritated, or hurts. °SEEK IMMEDIATE MEDICAL CARE IF:  °· Another part of your body feels weak or numb. °· You have difficulty swallowing. °· You have a fever along with symptoms of Bell palsy. °· You develop neck pain. °MAKE SURE YOU:  °· Understand these instructions. °· Will watch your condition. °· Will get help right away if you are not doing well or get worse. °  °This information is not intended to replace advice given to you by your health care provider. Make sure you discuss any questions you have with your health care provider. °  °Document Released: 05/29/2005 Document Revised: 02/17/2015 Document Reviewed: 09/05/2013 °Elsevier Interactive Patient Education ©2016 [employer] ° °

## 2015-09-18 NOTE — ED Notes (Signed)
At 2250 the patient's spouse called with concerns regarding the patient and his eye draining and burning.  I noticed that the MD that evaluated the patient was still here.  Dr. Juleen China spoke with the spouse giving them instructions.

## 2015-09-20 ENCOUNTER — Encounter: Payer: Self-pay | Admitting: Family Medicine

## 2015-09-20 ENCOUNTER — Ambulatory Visit (INDEPENDENT_AMBULATORY_CARE_PROVIDER_SITE_OTHER): Payer: Self-pay | Admitting: Family Medicine

## 2015-09-20 VITALS — BP 140/96 | Ht 75.0 in | Wt 267.5 lb

## 2015-09-20 DIAGNOSIS — I10 Essential (primary) hypertension: Secondary | ICD-10-CM

## 2015-09-20 DIAGNOSIS — G510 Bell's palsy: Secondary | ICD-10-CM

## 2015-09-20 MED ORDER — LISINOPRIL 5 MG PO TABS: 5.0000 mg | ORAL_TABLET | Freq: Every day | ORAL | Status: DC

## 2015-09-20 NOTE — Progress Notes (Signed)
   Subjective:    Patient ID: Nicolas [name], male    DOB: Mar 02, 1976, 40 y.o.   MRN: [id number]  HPI Patient in today for a ER follow up for Facial nerve Palsy. Seen at [facility] on 09/18/15. ER record reviewed started on Saturday morning with facial drooping and weakness no unilateral weakness no numbness denies any other particular troubles States no other concerns this visit.  Review of Systems    no unilateral numbness or weakness has mainly left side facial weakness no difficulty swallowing Objective:   Physical Exam  Patient can close both eyes but obviously weak on the left side facial muscles week on the left side difficult time raising the eyebrows difficult time with her raising the corner of his mouth with a smile. No unilateral numbness or weakness in the arms or legs.      Assessment & Plan:  Bell's palsy-patient on appropriate medication. Lacri-Lube recommended for the eye as well as tape. In addition to this follow-up in 2 weeks. Start lisinopril 1 daily for blood pressure. May need additional medicine or adjustment. If Bell's palsy is not improving over next couple weeks referral to ENT for further evaluation

## 2015-09-21 NOTE — Progress Notes (Signed)
appt made 5/2, pt verbalized understanding that he needs to come to this  appt

## 2015-10-12 ENCOUNTER — Ambulatory Visit: Payer: Self-pay | Admitting: Family Medicine

## 2015-10-27 ENCOUNTER — Encounter: Payer: Self-pay | Admitting: Family Medicine

## 2015-10-27 ENCOUNTER — Ambulatory Visit (INDEPENDENT_AMBULATORY_CARE_PROVIDER_SITE_OTHER): Payer: Self-pay | Admitting: Family Medicine

## 2015-10-27 VITALS — BP 136/86 | Ht 75.0 in | Wt 263.0 lb

## 2015-10-27 DIAGNOSIS — I10 Essential (primary) hypertension: Secondary | ICD-10-CM

## 2015-10-27 NOTE — Progress Notes (Signed)
   Subjective:    Patient ID: Brailon [name], male    DOB: 07/06/1975, 40 y.o.   MRN: [id number]  HPI Patient arrives to follow up on facial nerve pain. Patient states the pain is gone now. Patient was having Bell's palsy along with some pain is now gone muscle function back to normal taken blood pressure medicine doing well within denies any chest tightness pressure pain he is trying to lose weight and has been successful doing so Patient has been counseled to quit smoking Review of Systems    denies any other symptoms except for an occasional cough Objective:   Physical Exam Lungs clear heart regular HEENT is benign patient does have small skin tag near the lower eyelid he asked that that be removed This was removed and sterile fashion cleansed with alcohol sterile utensils used to clip this off no excessive bleeding small Band-Aid applied  No weakness to facial muscles noted     Assessment & Plan:  Bell's palsy resolved Hypertension fairly decent control patient working hard on losing weight much better than it was therefore continued weight loss follow-up again in 4-6 months lab work within the next 30 days  If dry cough develops is possible this could be related to the medication may need to change blood pressure medicine patient will let us know

## 2016-03-19 ENCOUNTER — Encounter (HOSPITAL_COMMUNITY): Payer: Self-pay | Admitting: Emergency Medicine

## 2016-03-19 ENCOUNTER — Emergency Department (HOSPITAL_COMMUNITY)
Admission: EM | Admit: 2016-03-19 | Discharge: 2016-03-19 | Disposition: A | Discharge status: Home / Self Care | Payer: Self-pay | Attending: Emergency Medicine | Admitting: Emergency Medicine

## 2016-03-19 DIAGNOSIS — L241 Irritant contact dermatitis due to oils and greases: Secondary | ICD-10-CM | POA: Insufficient documentation

## 2016-03-19 DIAGNOSIS — I10 Essential (primary) hypertension: Secondary | ICD-10-CM | POA: Insufficient documentation

## 2016-03-19 DIAGNOSIS — F17210 Nicotine dependence, cigarettes, uncomplicated: Secondary | ICD-10-CM | POA: Insufficient documentation

## 2016-03-19 MED ORDER — DEXAMETHASONE SODIUM PHOSPHATE 10 MG/ML IJ SOLN
10.0000 mg | Freq: Once | INTRAMUSCULAR | Status: AC
  Administered 2016-03-19: 10 mg via INTRAMUSCULAR

## 2016-03-19 MED ORDER — SILVER SULFADIAZINE 1 % EX CREA
TOPICAL_CREAM | Freq: Two times a day (BID) | CUTANEOUS | Status: DC
  Administered 2016-03-19: 01:00:00 via TOPICAL

## 2016-03-19 MED ORDER — SILVER SULFADIAZINE 1 % EX CREA
TOPICAL_CREAM | CUTANEOUS | Status: AC
  Filled 2016-03-19: qty 50

## 2016-03-19 MED ORDER — PREDNISONE 20 MG PO TABS: ORAL_TABLET | ORAL | 0 refills | Status: DC

## 2016-03-19 MED ORDER — DEXAMETHASONE SODIUM PHOSPHATE 10 MG/ML IJ SOLN
INTRAMUSCULAR | Status: AC
  Filled 2016-03-19: qty 1

## 2016-03-19 MED ORDER — HYDROXYZINE HCL 25 MG PO TABS: 25.0000 mg | ORAL_TABLET | Freq: Four times a day (QID) | ORAL | 0 refills | Status: DC
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
## 2016-03-19 NOTE — ED Notes (Signed)
Wounds dressed with silvadene and covered with 4x4 as well as 3 inch kling.

## 2016-04-13 ENCOUNTER — Other Ambulatory Visit: Payer: Self-pay | Admitting: *Deleted

## 2016-04-13 ENCOUNTER — Telehealth: Payer: Self-pay | Admitting: *Deleted

## 2016-04-13 MED ORDER — PREDNISONE 20 MG PO TABS: ORAL_TABLET | ORAL | 0 refills | Status: DC

## 2016-04-13 MED ORDER — SILVER SULFADIAZINE 1 % EX CREA: 1.0000 "application " | TOPICAL_CREAM | Freq: Every day | CUTANEOUS | 0 refills | Status: DC

## 2016-04-13 NOTE — Telephone Encounter (Signed)
Ok per dr Brett Canales. Med sent to pharm. Pt notified.

## 2016-04-13 NOTE — Progress Notes (Unsigned)
silver

## 2016-04-13 NOTE — Telephone Encounter (Signed)
Pt seen 10/8 at ED for irritant contact dermatitis. He was working on [place] and spilled some hydraulic oil on leg. Wife states rash got better but has come back. No fever. No pain.  Red blotchy from knee down and itchy. He was prescribed hydroxyzine and prednisone in the ER.

## 2016-04-13 NOTE — Telephone Encounter (Signed)
Med sent to pharm. Pt notified.  

## 2016-04-13 NOTE — Telephone Encounter (Signed)
pred taper per dr Brett Canales. Wife called back and wanted to know if he could get refill silver sulfadiazine cream because it really helped when he used it.

## 2016-04-13 NOTE — Telephone Encounter (Signed)
[place] apoth 

## 2016-06-16 ENCOUNTER — Ambulatory Visit (INDEPENDENT_AMBULATORY_CARE_PROVIDER_SITE_OTHER): Payer: Self-pay | Admitting: Family Medicine

## 2016-06-16 ENCOUNTER — Encounter: Payer: Self-pay | Admitting: Family Medicine

## 2016-06-16 VITALS — BP 132/84 | Temp 99.1°F | Ht 75.0 in | Wt 274.0 lb

## 2016-06-16 DIAGNOSIS — I889 Nonspecific lymphadenitis, unspecified: Secondary | ICD-10-CM

## 2016-06-16 DIAGNOSIS — R21 Rash and other nonspecific skin eruption: Secondary | ICD-10-CM

## 2016-06-16 MED ORDER — PENICILLIN V POTASSIUM 500 MG PO TABS: 500.0000 mg | ORAL_TABLET | Freq: Four times a day (QID) | ORAL | 0 refills | Status: DC

## 2016-06-16 MED ORDER — TRIAMCINOLONE ACETONIDE 0.1 % EX CREA: 1.0000 "application " | TOPICAL_CREAM | Freq: Two times a day (BID) | CUTANEOUS | 1 refills | Status: DC
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
## 2016-08-01 ENCOUNTER — Other Ambulatory Visit: Payer: Self-pay | Admitting: Family Medicine

## 2016-08-29 ENCOUNTER — Telehealth: Payer: Self-pay | Admitting: Family Medicine

## 2016-08-29 ENCOUNTER — Emergency Department (HOSPITAL_COMMUNITY)
Admission: EM | Admit: 2016-08-29 | Discharge: 2016-08-29 | Disposition: A | Discharge status: Home / Self Care | Payer: Self-pay | Attending: Emergency Medicine | Admitting: Emergency Medicine

## 2016-08-29 ENCOUNTER — Encounter (HOSPITAL_COMMUNITY): Payer: Self-pay | Admitting: Emergency Medicine

## 2016-08-29 DIAGNOSIS — I10 Essential (primary) hypertension: Secondary | ICD-10-CM | POA: Insufficient documentation

## 2016-08-29 DIAGNOSIS — F17210 Nicotine dependence, cigarettes, uncomplicated: Secondary | ICD-10-CM | POA: Insufficient documentation

## 2016-08-29 DIAGNOSIS — Z79899 Other long term (current) drug therapy: Secondary | ICD-10-CM | POA: Insufficient documentation

## 2016-08-29 DIAGNOSIS — M436 Torticollis: Secondary | ICD-10-CM | POA: Insufficient documentation

## 2016-08-29 MED ORDER — TRAMADOL HCL 50 MG PO TABS: 50.0000 mg | ORAL_TABLET | Freq: Four times a day (QID) | ORAL | 0 refills | Status: DC | PRN

## 2016-08-29 MED ORDER — DICLOFENAC SODIUM 75 MG PO TBEC: 75.0000 mg | DELAYED_RELEASE_TABLET | Freq: Two times a day (BID) | ORAL | 0 refills | Status: DC

## 2016-08-29 MED ORDER — CYCLOBENZAPRINE HCL 10 MG PO TABS: 10.0000 mg | ORAL_TABLET | Freq: Three times a day (TID) | ORAL | 0 refills | Status: DC | PRN

## 2016-08-29 MED ORDER — DIAZEPAM 5 MG PO TABS
10.0000 mg | ORAL_TABLET | Freq: Once | ORAL | Status: AC
  Administered 2016-08-29: 10 mg via ORAL
  Filled 2016-08-29: qty 2

## 2016-08-29 MED ORDER — KETOROLAC TROMETHAMINE 60 MG/2ML IM SOLN
60.0000 mg | Freq: Once | INTRAMUSCULAR | Status: AC
  Administered 2016-08-29: 60 mg via INTRAMUSCULAR
  Filled 2016-08-29: qty 2

## 2016-08-29 NOTE — ED Triage Notes (Signed)
Pain in neck since Friday.  Has continued and gotten worse.  Pt states he cant turn his neck

## 2016-08-29 NOTE — Telephone Encounter (Signed)
error 

## 2016-08-29 NOTE — Discharge Instructions (Signed)
Alternate ice and heat to your neck.  Follow-up with your primary doctor for recheck.  Return here for any worsening symptoms

## 2016-09-01 NOTE — ED Provider Notes (Signed)
[facility] Provider Note   CSN: [id number] Arrival date & time: 08/29/16  1421     History   Chief Complaint Chief Complaint  Patient presents with  . Torticollis    HPI Nicolas [name] is a 41 y.o. male.  HPI   Nicolas [name] is a 41 y.o. male who presents to the Emergency Department complaining of neck pain for 5 days.  He describes a sharp aching pain to his neck that is wore with movement of his neck.  Pain improves if his neck is held in one position.  He denies known injury, but sleeps on two pillows.  He denies pain or weakness or numbness of the upper extremities, headaches, visual changes and arm pain  Past Medical History:  Diagnosis Date  . History of stomach ulcers   . Hypertension   . Kidney stone    bil , different times  passed stones  . Migraines   . Pancreatitis     Patient Active Problem List   Diagnosis Date Noted  . Protein-calorie malnutrition, severe (HCC) 02/11/2014  . Peptic ulcer disease 02/10/2014  . Gastric ulcer 02/09/2014  . Nausea & vomiting 02/09/2014  . Nausea with vomiting 02/08/2014  . Tobacco use disorder 02/08/2014  . Obesity 02/01/2014  . Alcohol use 01/31/2014  . Acute pancreatitis 01/30/2014  . Musculoskeletal chest pain 01/19/2014  . Essential hypertension, benign 10/24/2012    Past Surgical History:  Procedure Laterality Date  . BIOPSY N/A 02/11/2014   Procedure: BIOPSY;  Surgeon: Corbin Ade, MD;  Location: [facility];  Service: Endoscopy;  Laterality: N/A;  . CARDIAC CATHETERIZATION  Aug 2015   normal EF, normal coronaries  . CARDIAC CATHETERIZATION    . ESOPHAGOGASTRODUODENOSCOPY (EGD) WITH PROPOFOL N/A 02/11/2014   Procedure: ESOPHAGOGASTRODUODENOSCOPY (EGD) WITH PROPOFOL;  Surgeon: Corbin Ade, MD;  Location: [facility];  Service: Endoscopy;  Laterality: N/A;  . LEFT HEART CATHETERIZATION WITH CORONARY ANGIOGRAM N/A 01/18/2014   Procedure: LEFT HEART CATHETERIZATION WITH CORONARY ANGIOGRAM;  Surgeon: Lennette Bihari, MD;  Location: [facility] CATH LAB;  Service: Cardiovascular;  Laterality: N/A;       Home Medications    Prior to Admission medications   Medication Sig Start Date End Date Taking? Authorizing Provider  cyclobenzaprine (FLEXERIL) 10 MG tablet Take 1 tablet (10 mg total) by mouth 3 (three) times daily as needed. 08/29/16   Zechariah Bissonnette, PA-C  diclofenac (VOLTAREN) 75 MG EC tablet Take 1 tablet (75 mg total) by mouth 2 (two) times daily. Take with food 08/29/16   Jearlene Bridwell, PA-C  lisinopril (PRINIVIL,ZESTRIL) 5 MG tablet Take 5 mg by mouth daily.    Historical Provider, MD  penicillin v potassium (VEETID) 500 MG tablet Take 1 tablet (500 mg total) by mouth 4 (four) times daily. 06/16/16   Merlyn Albert, MD  traMADol (ULTRAM) 50 MG tablet Take 1 tablet (50 mg total) by mouth every 6 (six) hours as needed. 08/29/16   Yamen Castrogiovanni, PA-C  triamcinolone cream (KENALOG) 0.1 % APPLY TO AFFECTED AREAS TWICE DAILY. 08/01/16   [name], MD    Family History Family History  Problem Relation Age of Onset  . Cancer Mother     Breast  . Colon cancer Neg Hx     Social History Social History  Substance Use Topics  . Smoking status: Current Every Day Smoker    Packs/day: 1.50    Years: 10.00    Types: Cigarettes  . Smokeless tobacco: Never Used  .  Alcohol use No     Allergies   Patient has no known allergies.   Review of Systems Review of Systems  Constitutional: Negative for chills and fever.  Gastrointestinal: Negative for abdominal pain.  Genitourinary: Negative for difficulty urinating and dysuria.  Musculoskeletal: Positive for neck pain. Negative for arthralgias, back pain and joint swelling.  Skin: Negative for color change and wound.  All other systems reviewed and are negative.    Physical Exam Updated Vital Signs BP (!) 169/102 (BP Location: Left Arm)   Pulse 97   Temp 98.8 F (37.1 C) (Oral)   Resp 20   Ht 6\' 3"  (1.905 m)   Wt 117.9 kg   SpO2 96%    BMI 32.50 kg/m   Physical Exam  Constitutional: He is oriented to person, place, and time. He appears well-developed and well-nourished. No distress.  HENT:  Head: Atraumatic.  Mouth/Throat: Oropharynx is clear and moist.  Eyes: Conjunctivae are normal. Pupils are equal, round, and reactive to light.  Cardiovascular: Normal rate, regular rhythm and intact distal pulses.   No murmur heard. Pulmonary/Chest: Effort normal and breath sounds normal. No respiratory distress. He exhibits no tenderness.  Musculoskeletal: Normal range of motion. He exhibits tenderness.  ttp of the left SCM muscles and bilateral trapezius muscles. Grip strength is strong and symmetrical.  Neurological: He is alert and oriented to person, place, and time. He has normal strength. No sensory deficit.  CN II -XII grossly intact  Skin: Skin is warm. No rash noted.  Nursing note and vitals reviewed.    ED Treatments / Results  Labs (all labs ordered are listed, but only abnormal results are displayed) Labs Reviewed - No data to display  EKG  EKG Interpretation None       Radiology No results found.  Procedures Procedures (including critical care time)  Medications Ordered in ED Medications  ketorolac (TORADOL) injection 60 mg (60 mg Intramuscular Given 08/29/16 1623)  diazepam (VALIUM) tablet 10 mg (10 mg Oral Given 08/29/16 1623)     Initial Impression / Assessment and Plan / ED Course  I have reviewed the triage vital signs and the nursing notes.  Pertinent labs & imaging results that were available during my care of the patient were reviewed by me and considered in my medical decision making (see chart for details).     Pt with likely torticollis.  NV intact.  No gross motor or sensory deficits.  Pt agrees to symptomatic tx, PCP f/u if needed  Final Clinical Impressions(s) / ED Diagnoses   Final diagnoses:  Torticollis    New Prescriptions Discharge Medication List as of 08/29/2016  5:14  PM    START taking these medications   Details  cyclobenzaprine (FLEXERIL) 10 MG tablet Take 1 tablet (10 mg total) by mouth 3 (three) times daily as needed., Starting Tue 08/29/2016, Print    diclofenac (VOLTAREN) 75 MG EC tablet Take 1 tablet (75 mg total) by mouth 2 (two) times daily. Take with food, Starting Tue 08/29/2016, Print    traMADol (ULTRAM) 50 MG tablet Take 1 tablet (50 mg total) by mouth every 6 (six) hours as needed., Starting Tue 08/29/2016, Print         Amiaya Mcneeley Ogden, PA-C 09/01/16 2352    Bethann Berkshire, MD 09/01/16 [phone]

## 2016-09-02 ENCOUNTER — Emergency Department (HOSPITAL_COMMUNITY): Payer: Self-pay

## 2016-09-02 ENCOUNTER — Encounter (HOSPITAL_COMMUNITY): Payer: Self-pay | Admitting: *Deleted

## 2016-09-02 ENCOUNTER — Emergency Department (HOSPITAL_COMMUNITY)
Admission: EM | Admit: 2016-09-02 | Discharge: 2016-09-02 | Disposition: A | Discharge status: Home / Self Care | Payer: Self-pay | Attending: Emergency Medicine | Admitting: Emergency Medicine

## 2016-09-02 DIAGNOSIS — Z79899 Other long term (current) drug therapy: Secondary | ICD-10-CM | POA: Insufficient documentation

## 2016-09-02 DIAGNOSIS — R911 Solitary pulmonary nodule: Secondary | ICD-10-CM | POA: Insufficient documentation

## 2016-09-02 DIAGNOSIS — F17210 Nicotine dependence, cigarettes, uncomplicated: Secondary | ICD-10-CM | POA: Insufficient documentation

## 2016-09-02 DIAGNOSIS — M62838 Other muscle spasm: Secondary | ICD-10-CM | POA: Insufficient documentation

## 2016-09-02 DIAGNOSIS — I10 Essential (primary) hypertension: Secondary | ICD-10-CM | POA: Insufficient documentation

## 2016-09-02 LAB — CBC WITH DIFFERENTIAL/PLATELET
BASOS ABS: 0 10*3/uL (ref 0.0–0.1)
Basophils Relative: 0 %
EOS PCT: 8 %
Eosinophils Absolute: 0.7 10*3/uL (ref 0.0–0.7)
HEMATOCRIT: 47.2 % (ref 39.0–52.0)
Hemoglobin: 17.2 g/dL — ABNORMAL HIGH (ref 13.0–17.0)
LYMPHS PCT: 37 %
Lymphs Abs: 3.3 10*3/uL (ref 0.7–4.0)
MCH: 32.9 pg (ref 26.0–34.0)
MCHC: 36.4 g/dL — ABNORMAL HIGH (ref 30.0–36.0)
MCV: 90.2 fL (ref 78.0–100.0)
Monocytes Absolute: 0.6 10*3/uL (ref 0.1–1.0)
Monocytes Relative: 7 %
NEUTROS ABS: 4.3 10*3/uL (ref 1.7–7.7)
Neutrophils Relative %: 48 %
PLATELETS: 272 10*3/uL (ref 150–400)
RBC: 5.23 MIL/uL (ref 4.22–5.81)
RDW: 13.7 % (ref 11.5–15.5)
WBC: 8.9 10*3/uL (ref 4.0–10.5)

## 2016-09-02 LAB — BASIC METABOLIC PANEL
Anion gap: 8 (ref 5–15)
BUN: 22 mg/dL — AB (ref 6–20)
CHLORIDE: 104 mmol/L (ref 101–111)
CO2: 26 mmol/L (ref 22–32)
Calcium: 9.6 mg/dL (ref 8.9–10.3)
Creatinine, Ser: 0.9 mg/dL (ref 0.61–1.24)
GFR calc Af Amer: >60 mL/min (ref >60)
GLUCOSE: 107 mg/dL — AB (ref 65–99)
POTASSIUM: 3.9 mmol/L (ref 3.5–5.1)
Sodium: 138 mmol/L (ref 135–145)

## 2016-09-02 LAB — MAGNESIUM: Magnesium: 1.9 mg/dL (ref 1.7–2.4)

## 2016-09-02 MED ORDER — DIAZEPAM 5 MG PO TABS: 5.0000 mg | ORAL_TABLET | Freq: Four times a day (QID) | ORAL | 0 refills | Status: DC | PRN

## 2016-09-02 MED ORDER — SODIUM CHLORIDE 0.9 % IV BOLUS (SEPSIS)
1000.0000 mL | Freq: Once | INTRAVENOUS | Status: AC
  Administered 2016-09-02: 1000 mL via INTRAVENOUS

## 2016-09-02 MED ORDER — IOPAMIDOL (ISOVUE-300) INJECTION 61%
INTRAVENOUS | Status: AC
  Filled 2016-09-02: qty 75

## 2016-09-02 MED ORDER — KETOROLAC TROMETHAMINE 30 MG/ML IJ SOLN
30.0000 mg | Freq: Once | INTRAMUSCULAR | Status: AC
  Administered 2016-09-02: 30 mg via INTRAVENOUS
  Filled 2016-09-02: qty 1

## 2016-09-02 MED ORDER — LORAZEPAM 2 MG/ML IJ SOLN
2.0000 mg | Freq: Once | INTRAMUSCULAR | Status: AC
  Administered 2016-09-02: 2 mg via INTRAVENOUS
  Filled 2016-09-02: qty 1

## 2016-09-02 NOTE — Discharge Instructions (Signed)
The CT scan shows a lung nodule (spot). Given your smoking history you need to have a repeat CT scan in about 12 months. Your primary care doctor can help arrange this.

## 2016-09-02 NOTE — ED Triage Notes (Signed)
Pt c/o pain in his neck, states that he was seen in er on 08/29/2016, received a shot while in er and felt better for about 24 hours then the pain returned, denies any injury,

## 2016-09-02 NOTE — ED Provider Notes (Signed)
[facility]-EMER[facility]Y DEPT Provider Note   CSN: [id number] Arrival date & time: 09/02/16  0258     History   Chief Complaint Chief Complaint  Patient presents with  . Neck Pain    HPI Nicolas [name] is a 41 y.o. male.  HPI  41 year old male presents with severe neck pain. The pain is posterior, right worse than left. He is able to move his neck but it hurts so he limits range of motion. However he states if he had to he could move his neck fully. This has been ongoing for a week and a half. He was seen here on 3/20 and diagnosed with torticollis and discharged home. He states the shot he was given made his pain normal completely resolved. However since then the pain has slowly been worsening. He has taken the medicines given without relief. Tonight the pain woke him up and was more severe. There is no headache, fevers, blurry vision, vomiting, or weakness/numbness. No injuries. No sore throat. No anterior neck pain. He states his bilateral trapezius medially feels swollen and his wife tells him that he has knots in his back.  Past Medical History:  Diagnosis Date  . History of stomach ulcers   . Hypertension   . Kidney stone    bil , different times  passed stones  . Migraines   . Pancreatitis     Patient Active Problem List   Diagnosis Date Noted  . Protein-calorie malnutrition, severe (HCC) 02/11/2014  . Peptic ulcer disease 02/10/2014  . Gastric ulcer 02/09/2014  . Nausea & vomiting 02/09/2014  . Nausea with vomiting 02/08/2014  . Tobacco use disorder 02/08/2014  . Obesity 02/01/2014  . Alcohol use 01/31/2014  . Acute pancreatitis 01/30/2014  . Musculoskeletal chest pain 01/19/2014  . Essential hypertension, benign 10/24/2012    Past Surgical History:  Procedure Laterality Date  . BIOPSY N/A 02/11/2014   Procedure: BIOPSY;  Surgeon: Corbin Ade, MD;  Location: [facility];  Service: Endoscopy;  Laterality: N/A;  . CARDIAC CATHETERIZATION  Aug 2015   normal EF, normal  coronaries  . CARDIAC CATHETERIZATION    . ESOPHAGOGASTRODUODENOSCOPY (EGD) WITH PROPOFOL N/A 02/11/2014   Procedure: ESOPHAGOGASTRODUODENOSCOPY (EGD) WITH PROPOFOL;  Surgeon: Corbin Ade, MD;  Location: [facility];  Service: Endoscopy;  Laterality: N/A;  . LEFT HEART CATHETERIZATION WITH CORONARY ANGIOGRAM N/A 01/18/2014   Procedure: LEFT HEART CATHETERIZATION WITH CORONARY ANGIOGRAM;  Surgeon: Lennette Bihari, MD;  Location: [facility] C[facility] LAB;  Service: Cardiovascular;  Laterality: N/A;       Home Medications    Prior to Admission medications   Medication Sig Start Date End Date Taking? Authorizing Provider  diazepam (VALIUM) 5 MG tablet Take 1 tablet (5 mg total) by mouth every 6 (six) hours as needed for muscle spasms. 09/02/16   Pricilla Loveless, MD  diclofenac (VOLTAREN) 75 MG EC tablet Take 1 tablet (75 mg total) by mouth 2 (two) times daily. Take with food 08/29/16   Tammy Triplett, PA-C  lisinopril (PRINIVIL,ZESTRIL) 5 MG tablet Take 5 mg by mouth daily.    Historical Provider, MD  penicillin v potassium (VEETID) 500 MG tablet Take 1 tablet (500 mg total) by mouth 4 (four) times daily. 06/16/16   Merlyn Albert, MD  traMADol (ULTRAM) 50 MG tablet Take 1 tablet (50 mg total) by mouth every 6 (six) hours as needed. 08/29/16   Tammy Triplett, PA-C  triamcinolone cream (KENALOG) 0.1 % APPLY TO AFFECTED AREAS TWICE DAILY. 08/01/16   Lorin Picket  Lenon Ahmadi, MD    Family History Family History  Problem Relation Age of Onset  . Cancer Mother     Breast  . Colon cancer Neg Hx     Social History Social History  Substance Use Topics  . Smoking status: Current Every Day Smoker    Packs/day: 1.50    Years: 10.00    Types: Cigarettes  . Smokeless tobacco: Never Used  . Alcohol use No     Allergies   Patient has no known allergies.   Review of Systems Review of Systems  Constitutional: Negative for fever.  Eyes: Negative for visual disturbance.  Gastrointestinal: Negative for nausea and vomiting.    Musculoskeletal: Positive for neck pain and neck stiffness.  Neurological: Negative for weakness, numbness and headaches.  All other systems reviewed and are negative.    Physical Exam Updated Vital Signs BP (!) 141/97 (BP Location: Right Arm)   Pulse 79   Temp 98.2 F (36.8 C) (Oral)   Resp 17   Ht 6\' 3"  (1.905 m)   Wt 260 lb (117.9 kg)   SpO2 97%   BMI 32.50 kg/m   Physical Exam  Constitutional: He is oriented to person, place, and time. He appears well-developed and well-nourished.  HENT:  Head: Normocephalic and atraumatic.  Right Ear: External ear normal.  Left Ear: External ear normal.  Nose: Nose normal.  Mouth/Throat: Oropharynx is clear and moist.  Eyes: EOM are normal. Pupils are equal, round, and reactive to light. Right eye exhibits no discharge. Left eye exhibits no discharge.  Neck: Neck supple. Muscular tenderness present. Decreased range of motion present. No edema and no erythema present.    Cardiovascular: Normal rate, regular rhythm and normal heart sounds.   Pulmonary/Chest: Effort normal and breath sounds normal.  Abdominal: Soft. There is no tenderness.  Musculoskeletal: He exhibits no edema.  Neurological: He is alert and oriented to person, place, and time.  CN 3-12 grossly intact. 5/5 strength in all 4 extremities. Grossly normal sensation. Normal finger to nose.   Skin: Skin is warm and dry.  Nursing note and vitals reviewed.    ED Treatments / Results  Labs (all labs ordered are listed, but only abnormal results are displayed) Labs Reviewed  BASIC METABOLIC PANEL - Abnormal; Notable for the following:       Result Value   Glucose, Bld 107 (*)    BUN 22 (*)    All other components within normal limits  CBC WITH DIFFERENTIAL/PLATELET - Abnormal; Notable for the following:    Hemoglobin 17.2 (*)    MCHC 36.4 (*)    All other components within normal limits  MAGNESIUM    EKG  EKG Interpretation None       Radiology Ct Soft  Tissue Neck W Contrast  Result Date: 09/02/2016 CLINICAL DATA:  Initial evaluation for acute neck stiffness. EXAM: CT NECK WITH CONTRAST TECHNIQUE: Multidetector CT imaging of the neck was performed using the standard protocol following the bolus administration of intravenous contrast. CONTRAST:  75 cc of Isovue-300. COMPARISON:  None available. FINDINGS: Pharynx and larynx: Oral cavity within normal limits. No acute abnormality about the dentition. Palatine tonsils within normal limits. Calcified tonsillith noted within the left tonsil. Parapharyngeal fat preserved. Nasopharynx normal. Retropharyngeal soft tissues within normal limits. Calcifications noted adjacent to the anterior ring of C1 without inflammatory changes to suggest acute longus coli tendonitis. Epiglottis normal. Vallecula clear. Remainder of the hypopharynx and supraglottic larynx within normal limits. True cords  symmetric and normal. Subglottic airway clear. Salivary glands: Parotid and submandibular glands are normal. Thyroid: Thyroid normal. Lymph nodes: Shotty subcentimeter lymph nodes seen throughout the neck bilaterally. No pathologically enlarged lymph nodes identified. Vascular: Normal intravascular enhancement seen throughout the neck. Limited intracranial: Unremarkable. Visualized orbits: Visualized globes and orbital soft tissues unremarkable. Mastoids and visualized paranasal sinuses: Scattered mucosal thickening within the visualized ethmoidal air cells and sphenoid sinuses. Visualized paranasal sinuses otherwise clear. Mastoids and middle ear cavities are clear. Skeleton: Multilevel degenerative spondylolysis noted within the cervical spine. No acute osseous abnormality. No worrisome lytic or blastic osseous lesion. Upper chest: Upper mediastinum within normal limits. 4 mm right upper lobe pulmonary nodule noted (series 5, image 121), indeterminate. Linear atelectasis noted at the right lung apex. Mild scattered air trapping.  Visualized lungs are otherwise clear. Other: None. IMPRESSION: 1. No acute abnormality identified within the neck. 2. 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 3. Mild inflammatory paranasal sinus disease. 4. 4 mm right upper lobe pulmonary nodule, indeterminate. No follow-up needed if patient is low-risk. Non-contrast chest CT can be considered in 12 months if patient is high-risk. This recommendation follows the consensus statement: Guidelines for Management of Incidental Pulmonary Nodules Detected on CT Images: From the Fleischner Society 2017; Radiology 2017; 284:228-243. Electronically Signed   By: Rise Mu M.D.   On: 09/02/2016 06:00    Procedures Procedures (including critical care time)  Medications Ordered in ED Medications  sodium chloride 0.9 % bolus 1,000 mL (0 mLs Intravenous Stopped 09/02/16 0701)  ketorolac (TORADOL) 30 MG/ML injection 30 mg (30 mg Intravenous Given 09/02/16 0406)  LORazepam (ATIVAN) injection 2 mg (2 mg Intravenous Given 09/02/16 0406)     Initial Impression / Assessment and Plan / ED Course  I have reviewed the triage vital signs and the nursing notes.  Pertinent labs & imaging results that were available during my care of the patient were reviewed by me and considered in my medical decision making (see chart for details).     After toradol and ativan (given valium shortage), he is feeling much better and now has full, free ROM of neck. I think this is all muscle spasm. There is no one solitary area that would be amenable to trigger point injection, is more diffuse. Given recurrent presentation, CT obtained, no significant pathology. Made aware of nodule, needs outpatient repeat CT scan given smoking. Doubt infectious cause. No headache. Will treat as muscle spasm, stop flexeril, try valium. F/u with PCP, may need PT and further exercises. D/c home with return precautions.  Final Clinical Impressions(s) / ED  Diagnoses   Final diagnoses:  Muscle spasms of neck  Pulmonary nodule    New Prescriptions Discharge Medication List as of 09/02/2016  6:47 AM    START taking these medications   Details  diazepam (VALIUM) 5 MG tablet Take 1 tablet (5 mg total) by mouth every 6 (six) hours as needed for muscle spasms., Starting Sat 09/02/2016, Print         Pricilla Loveless, MD 09/02/16 1556

## 2016-10-24 ENCOUNTER — Telehealth: Payer: Self-pay | Admitting: Family Medicine

## 2016-10-24 ENCOUNTER — Other Ambulatory Visit: Payer: Self-pay | Admitting: *Deleted

## 2016-10-24 MED ORDER — KETOCONAZOLE 2 % EX CREA: 1.0000 "application " | TOPICAL_CREAM | Freq: Two times a day (BID) | CUTANEOUS | 2 refills | Status: DC

## 2016-10-24 NOTE — Telephone Encounter (Signed)
Pt is wanting to know if something can be called in for chafing. Pt has had something called in, in the past.     [facility]

## 2016-10-24 NOTE — Telephone Encounter (Signed)
Discussed with pt. Med sent to pharm.  

## 2016-10-24 NOTE — Telephone Encounter (Signed)
Try Nizoral cream apply twice a day when necessary 60 g 2 refills, is best as possible keep area dry and cool

## 2017-06-30 ENCOUNTER — Encounter (HOSPITAL_COMMUNITY): Payer: Self-pay | Admitting: *Deleted

## 2017-06-30 ENCOUNTER — Emergency Department (HOSPITAL_COMMUNITY)
Admission: EM | Admit: 2017-06-30 | Discharge: 2017-06-30 | Disposition: A | Discharge status: Home / Self Care | Payer: Self-pay | Attending: Emergency Medicine | Admitting: Emergency Medicine

## 2017-06-30 ENCOUNTER — Emergency Department (HOSPITAL_COMMUNITY): Payer: Self-pay

## 2017-06-30 DIAGNOSIS — B356 Tinea cruris: Secondary | ICD-10-CM | POA: Insufficient documentation

## 2017-06-30 DIAGNOSIS — F17210 Nicotine dependence, cigarettes, uncomplicated: Secondary | ICD-10-CM | POA: Insufficient documentation

## 2017-06-30 DIAGNOSIS — M5489 Other dorsalgia: Secondary | ICD-10-CM | POA: Insufficient documentation

## 2017-06-30 DIAGNOSIS — M549 Dorsalgia, unspecified: Secondary | ICD-10-CM

## 2017-06-30 DIAGNOSIS — I10 Essential (primary) hypertension: Secondary | ICD-10-CM | POA: Insufficient documentation

## 2017-06-30 DIAGNOSIS — R21 Rash and other nonspecific skin eruption: Secondary | ICD-10-CM | POA: Insufficient documentation

## 2017-06-30 LAB — URINALYSIS, ROUTINE W REFLEX MICROSCOPIC
BILIRUBIN URINE: NEGATIVE
GLUCOSE, UA: NEGATIVE mg/dL
Hgb urine dipstick: NEGATIVE
KETONES UR: NEGATIVE mg/dL
Leukocytes, UA: NEGATIVE
Nitrite: NEGATIVE
PH: 6 (ref 5.0–8.0)
PROTEIN: NEGATIVE mg/dL
Specific Gravity, Urine: 1.018 (ref 1.005–1.030)

## 2017-06-30 LAB — LIPASE, BLOOD: Lipase: 30 U/L (ref 11–51)

## 2017-06-30 LAB — CBC
HCT: 44.8 % (ref 39.0–52.0)
Hemoglobin: 15.8 g/dL (ref 13.0–17.0)
MCH: 32 pg (ref 26.0–34.0)
MCHC: 35.3 g/dL (ref 30.0–36.0)
MCV: 90.9 fL (ref 78.0–100.0)
PLATELETS: 263 10*3/uL (ref 150–400)
RBC: 4.93 MIL/uL (ref 4.22–5.81)
RDW: 14.1 % (ref 11.5–15.5)
WBC: 10.9 10*3/uL — AB (ref 4.0–10.5)

## 2017-06-30 LAB — BASIC METABOLIC PANEL
Anion gap: 9 (ref 5–15)
BUN: 21 mg/dL — ABNORMAL HIGH (ref 6–20)
CALCIUM: 9.7 mg/dL (ref 8.9–10.3)
CO2: 26 mmol/L (ref 22–32)
CREATININE: 0.95 mg/dL (ref 0.61–1.24)
Chloride: 105 mmol/L (ref 101–111)
GLUCOSE: 108 mg/dL — AB (ref 65–99)
Potassium: 4 mmol/L (ref 3.5–5.1)
Sodium: 140 mmol/L (ref 135–145)

## 2017-06-30 MED ORDER — CYCLOBENZAPRINE HCL 10 MG PO TABS: 10.0000 mg | ORAL_TABLET | Freq: Two times a day (BID) | ORAL | 0 refills | Status: DC | PRN

## 2017-06-30 MED ORDER — KETOROLAC TROMETHAMINE 30 MG/ML IJ SOLN
30.0000 mg | Freq: Once | INTRAMUSCULAR | Status: AC
  Administered 2017-06-30: 30 mg via INTRAVENOUS
  Filled 2017-06-30: qty 1

## 2017-06-30 MED ORDER — ETODOLAC 500 MG PO TABS: 500.0000 mg | ORAL_TABLET | Freq: Two times a day (BID) | ORAL | 0 refills | Status: DC

## 2017-06-30 MED ORDER — MICONAZOLE NITRATE 2 % EX CREA: 1.0000 "application " | TOPICAL_CREAM | Freq: Two times a day (BID) | CUTANEOUS | 1 refills | Status: DC

## 2017-06-30 NOTE — ED Provider Notes (Signed)
[facility] EMERGENCY DEPARTMENT Provider Note   CSN: [id number] Arrival date & time: 06/30/17  1800     History   Chief Complaint Chief Complaint  Patient presents with  . Flank Pain    HPI Nicolas [name] is a 42 y.o. male.  HPI Patient presents to the emergency room for evaluation of left-sided flank pain that started a couple weeks ago.  Patient states the pain is in the left flank area.  It has been persistent for the last couple of weeks.  He has noticed that certain positions and movements exacerbate the pain.  Patient was concerned that it could be from a kidney stone because the pain is in the same location of prior episodes of pain that he has had with kidney stones.  He has not noticed any blood in his urine.  He has not had any nausea or vomiting like he has had in the past with kidney stones.  He denies any numbness or weakness.  No fevers or chills.  No vomiting or diarrhea.  Patient also asked about a rash she has had in his groin region for a couple of months.  He feels like that area is often moist.  He has tried powders but it has not helped.  She figured he would ask about this issue because he has difficulty seeing his primary care doctor because of his work schedule. Past Medical History:  Diagnosis Date  . History of stomach ulcers   . Hypertension   . Kidney stone    bil , different times  passed stones  . Migraines   . Pancreatitis     Patient Active Problem List   Diagnosis Date Noted  . Protein-calorie malnutrition, severe (HCC) 02/11/2014  . Peptic ulcer disease 02/10/2014  . Gastric ulcer 02/09/2014  . Nausea & vomiting 02/09/2014  . Nausea with vomiting 02/08/2014  . Tobacco use disorder 02/08/2014  . Obesity 02/01/2014  . Alcohol use 01/31/2014  . Acute pancreatitis 01/30/2014  . Musculoskeletal chest pain 01/19/2014  . Essential hypertension, benign 10/24/2012    Past Surgical History:  Procedure Laterality Date  . BIOPSY N/A 02/11/2014   Procedure: BIOPSY;  Surgeon: Corbin Ade, MD;  Location: [facility];  Service: Endoscopy;  Laterality: N/A;  . CARDIAC CATHETERIZATION  Aug 2015   normal EF, normal coronaries  . CARDIAC CATHETERIZATION    . ESOPHAGOGASTRODUODENOSCOPY (EGD) WITH PROPOFOL N/A 02/11/2014   Procedure: ESOPHAGOGASTRODUODENOSCOPY (EGD) WITH PROPOFOL;  Surgeon: Corbin Ade, MD;  Location: [facility];  Service: Endoscopy;  Laterality: N/A;  . LEFT HEART CATHETERIZATION WITH CORONARY ANGIOGRAM N/A 01/18/2014   Procedure: LEFT HEART CATHETERIZATION WITH CORONARY ANGIOGRAM;  Surgeon: Lennette Bihari, MD;  Location: [facility] CATH LAB;  Service: Cardiovascular;  Laterality: N/A;       Home Medications    Prior to Admission medications   Medication Sig Start Date End Date Taking? Authorizing Provider  cyclobenzaprine (FLEXERIL) 10 MG tablet Take 1 tablet (10 mg total) by mouth 2 (two) times daily as needed for muscle spasms. 06/30/17   Linwood Dibbles, MD  etodolac (LODINE) 500 MG tablet Take 1 tablet (500 mg total) by mouth 2 (two) times daily. 06/30/17   Linwood Dibbles, MD  miconazole (MICOTIN) 2 % cream Apply 1 application topically 2 (two) times daily. 06/30/17   Linwood Dibbles, MD    Family History Family History  Problem Relation Age of Onset  . Cancer Mother        Breast  .  Colon cancer Neg Hx     Social History Social History   Tobacco Use  . Smoking status: Current Every Day Smoker    Packs/day: 1.50    Years: 10.00    Pack years: 15.00    Types: Cigarettes  . Smokeless tobacco: Never Used  Substance Use Topics  . Alcohol use: No  . Drug use: No     Allergies   Patient has no known allergies.   Review of Systems Review of Systems  All other systems reviewed and are negative.    Physical Exam Updated Vital Signs BP (!) 168/100   Pulse 100   Temp 98.6 F (37 C) (Oral)   Resp 16   Ht 1.905 m (6\' 3" )   Wt 117 kg (258 lb)   SpO2 100%   BMI 32.25 kg/m   Physical Exam  Constitutional: He appears  well-developed and well-nourished. No distress.  HENT:  Head: Normocephalic and atraumatic.  Right Ear: External ear normal.  Left Ear: External ear normal.  Eyes: Conjunctivae are normal. Right eye exhibits no discharge. Left eye exhibits no discharge. No scleral icterus.  Neck: Neck supple. No tracheal deviation present.  Cardiovascular: Normal rate, regular rhythm and intact distal pulses.  Pulmonary/Chest: Effort normal and breath sounds normal. No stridor. No respiratory distress. He has no wheezes. He has no rales.  Abdominal: Soft. Bowel sounds are normal. He exhibits no distension. There is no tenderness. There is CVA tenderness (left). There is no rebound and no guarding.  Genitourinary:  Genitourinary Comments: Erythematous rash in the anal skin folds, suggestive of tinea cruris  Musculoskeletal: He exhibits no edema or tenderness.  Neurological: He is alert. He has normal strength. No cranial nerve deficit (no facial droop, extraocular movements intact, no slurred speech) or sensory deficit. He exhibits normal muscle tone. He displays no seizure activity. Coordination normal.  Skin: Skin is warm and dry. No rash noted.  Psychiatric: He has a normal mood and affect.  Nursing note and vitals reviewed.    ED Treatments / Results  Labs (all labs ordered are listed, but only abnormal results are displayed) Labs Reviewed  CBC - Abnormal; Notable for the following components:      Result Value   WBC 10.9 (*)    All other components within normal limits  BASIC METABOLIC PANEL - Abnormal; Notable for the following components:   Glucose, Bld 108 (*)    BUN 21 (*)    All other components within normal limits  URINALYSIS, ROUTINE W REFLEX MICROSCOPIC - Abnormal; Notable for the following components:   APPearance CLOUDY (*)    All other components within normal limits  LIPASE, BLOOD     Radiology Dg Abdomen 1 View  Result Date: 06/30/2017 CLINICAL DATA:  Left flank pain.   History of kidney stones. EXAM: ABDOMEN - 1 VIEW COMPARISON:  Abdominal CT 02/09/2014 FINDINGS: Pelvic calcifications were seen on comparison CT and are from phleboliths. There also a left prostatic calcification. No convincing urolithiasis. Normal bowel gas pattern. Partially visualized stomach that is moderately distended and opacified. Right adrenal calcification. IMPRESSION: No visible urolithiasis. Electronically Signed   By: Marnee Spring M.D.   On: 06/30/2017 19:20    Procedures Procedures (including critical care time)  Medications Ordered in ED Medications  ketorolac (TORADOL) 30 MG/ML injection 30 mg (30 mg Intravenous Given 06/30/17 1833)     Initial Impression / Assessment and Plan / ED Course  I have reviewed the triage vital signs  and the nursing notes.  Pertinent labs & imaging results that were available during my care of the patient were reviewed by me and considered in my medical decision making (see chart for details).   Patient presented with complaints of flank pain.  He was concerned about kidney stones however the symptoms seem to be more musculoskeletal in nature.  His laboratory tests are reassuring.  Urinalysis does not show any blood.  I doubt ureterolithiasis as the etiology of his symptoms.  Plan on discharge home with NSAIDs and muscle relaxants.  Recommend follow-up with his primary care doctor.  Consider physical therapy or other treatments.  Patient also mentioned a rash in the inguinal region.  This appears to be tinea cruris.  Will discharge home with rx for topical antifungal agents.  Final Clinical Impressions(s) / ED Diagnoses   Final diagnoses:  Tinea cruris  Musculoskeletal back pain    ED Discharge Orders        Ordered    etodolac (LODINE) 500 MG tablet  2 times daily     06/30/17 1949    cyclobenzaprine (FLEXERIL) 10 MG tablet  2 times daily PRN     06/30/17 1949    miconazole (MICOTIN) 2 % cream  2 times daily     06/30/17 1949         Linwood Dibbles, MD 06/30/17 1951

## 2017-06-30 NOTE — ED Triage Notes (Signed)
Pt with left flank pain for 2 weeks, denies any blood in urine. Pt states hx of kidney stones, denies burning on urination.

## 2017-06-30 NOTE — Discharge Instructions (Signed)
Take the medications as needed for pain, follow-up with a primary care doctor the symptoms are not improving in the next week, consider physical therapy or other treatments

## 2017-08-08 ENCOUNTER — Ambulatory Visit: Payer: Self-pay | Admitting: Family Medicine

## 2017-08-09 ENCOUNTER — Encounter: Payer: Self-pay | Admitting: Family Medicine

## 2017-08-09 ENCOUNTER — Ambulatory Visit: Payer: Self-pay | Admitting: Family Medicine

## 2017-08-09 VITALS — BP 168/98 | Ht 75.0 in | Wt 273.0 lb

## 2017-08-09 DIAGNOSIS — Z79899 Other long term (current) drug therapy: Secondary | ICD-10-CM

## 2017-08-09 DIAGNOSIS — M722 Plantar fascial fibromatosis: Secondary | ICD-10-CM

## 2017-08-09 DIAGNOSIS — I10 Essential (primary) hypertension: Secondary | ICD-10-CM

## 2017-08-09 DIAGNOSIS — Z13220 Encounter for screening for lipoid disorders: Secondary | ICD-10-CM

## 2017-08-09 DIAGNOSIS — L409 Psoriasis, unspecified: Secondary | ICD-10-CM

## 2017-08-09 MED ORDER — MOMETASONE FUROATE 0.1 % EX CREA: TOPICAL_CREAM | CUTANEOUS | 1 refills | Status: DC

## 2017-08-09 MED ORDER — LISINOPRIL 10 MG PO TABS: 10.0000 mg | ORAL_TABLET | Freq: Every day | ORAL | 3 refills | Status: DC

## 2017-08-09 MED ORDER — PREDNISONE 20 MG PO TABS: ORAL_TABLET | ORAL | 0 refills | Status: DC

## 2017-08-09 NOTE — Progress Notes (Signed)
   Subjective:    Patient ID: Nicolas [name], male    DOB: 11-17-1975, 42 y.o.   MRN: [id number]  Foot Pain  This is a new problem. Episode onset: 2 weeks. Pertinent negatives include no abdominal pain, chest pain, congestion, coughing, fatigue, fever, headaches, nausea or weakness. Associated symptoms comments: Bilateral heel pain. Treatments tried: saw DR. McKinney.  He does relate significant pain in the heels that is worse when he stands on he states the pain gets a little better if he is at rest but then gets worse when he stands back on them again does not doing his stretching has not been taking any medication does have a history of gastric ulcers  Psoriasis on right elbow.  Gets worse in the wintertime because he is not thickening in the skin  Pt has been off of BP med for about one year. Was taking lisinopril.  Was taking lisinopril but stopped taking this now blood pressure up significantly patient does smoke he has gained weight he does not watch how he eats denies any chest tightness pressure pain   Review of Systems  Constitutional: Negative for activity change, fatigue and fever.  HENT: Negative for congestion and rhinorrhea.   Respiratory: Negative for cough and shortness of breath.   Cardiovascular: Negative for chest pain and leg swelling.  Gastrointestinal: Negative for abdominal pain, diarrhea and nausea.  Genitourinary: Negative for dysuria and hematuria.  Neurological: Negative for weakness and headaches.  Psychiatric/Behavioral: Negative for behavioral problems.       Objective:   Physical Exam  Constitutional: He appears well-nourished. No distress.  HENT:  Head: Normocephalic and atraumatic.  Eyes: Right eye exhibits no discharge. Left eye exhibits no discharge.  Neck: No tracheal deviation present.  Cardiovascular: Normal rate, regular rhythm and normal heart sounds.  No murmur heard. Pulmonary/Chest: Effort normal and breath sounds normal. No respiratory  distress. He has no wheezes.  Musculoskeletal: He exhibits no edema.  Lymphadenopathy:    He has no cervical adenopathy.  Neurological: He is alert.  Skin: Skin is warm. No rash noted.  Psychiatric: His behavior is normal.  Vitals reviewed.   Psoriasis noted on both elbows      Assessment & Plan:  1. Essential hypertension, benign With poor control recommend getting back on medication lisinopril 10 mg day recommend patient quit smoking Patient does use tobacco products.  Patient knows they should quit.  Patient is aware that smoking/in use of tobacco products increases their risk of heart disease, cancer, and COPD- lung issues.  Patient has been counseled to quit smoking/tobacco products.  - Basic metabolic panel  2. Plantar fasciitis Short course prednisone should help this.  Follow-up with podiatry may need injections, I did show him stretching exercises strengthening exercises I personally would not want him on long-term anti-inflammatories because of his history of a gastric ulcer  3. Psoriasis Steroid cream as directed to help with psoriasis  4. Screening for lipid disorders Lipid profile before next visit - Lipid panel  5. High risk medication use Liver profile before next visit - Hepatic function panel  Follow-up in 4-6 weeks

## 2017-08-09 NOTE — Patient Instructions (Signed)
DASH Eating Plan DASH stands for "Dietary Approaches to Stop Hypertension." The DASH eating plan is a healthy eating plan that has been shown to reduce high blood pressure (hypertension). It may also reduce your risk for type 2 diabetes, heart disease, and stroke. The DASH eating plan may also help with weight loss. What are tips for following this plan? General guidelines  Avoid eating more than 2,300 mg (milligrams) of salt (sodium) a day. If you have hypertension, you may need to reduce your sodium intake to 1,500 mg a day.  Limit alcohol intake to no more than 1 drink a day for nonpregnant women and 2 drinks a day for men. One drink equals 12 oz of beer, 5 oz of wine, or 1 oz of hard liquor.  Work with your health care provider to maintain a healthy body weight or to lose weight. Ask what an ideal weight is for you.  Get at least 30 minutes of exercise that causes your heart to beat faster (aerobic exercise) most days of the week. Activities may include walking, swimming, or biking.  Work with your health care provider or diet and nutrition specialist (dietitian) to adjust your eating plan to your individual calorie needs. Reading food labels  Check food labels for the amount of sodium per serving. Choose foods with less than 5 percent of the Daily Value of sodium. Generally, foods with less than 300 mg of sodium per serving fit into this eating plan.  To find whole grains, look for the word "whole" as the first word in the ingredient list. Shopping  Buy products labeled as "low-sodium" or "no salt added."  Buy fresh foods. Avoid canned foods and premade or frozen meals. Cooking  Avoid adding salt when cooking. Use salt-free seasonings or herbs instead of table salt or sea salt. Check with your health care provider or pharmacist before using salt substitutes.  Do not fry foods. Cook foods using healthy methods such as baking, boiling, grilling, and broiling instead.  Cook with  heart-healthy oils, such as olive, canola, soybean, or sunflower oil. Meal planning   Eat a balanced diet that includes: ? 5 or more servings of fruits and vegetables each day. At each meal, try to fill half of your plate with fruits and vegetables. ? Up to 6-8 servings of whole grains each day. ? Less than 6 oz of lean meat, poultry, or fish each day. A 3-oz serving of meat is about the same size as a deck of cards. One egg equals 1 oz. ? 2 servings of low-fat dairy each day. ? A serving of nuts, seeds, or beans 5 times each week. ? Heart-healthy fats. Healthy fats called Omega-3 fatty acids are found in foods such as flaxseeds and coldwater fish, like sardines, salmon, and mackerel.  Limit how much you eat of the following: ? Canned or prepackaged foods. ? Food that is high in trans fat, such as fried foods. ? Food that is high in saturated fat, such as fatty meat. ? Sweets, desserts, sugary drinks, and other foods with added sugar. ? Full-fat dairy products.  Do not salt foods before eating.  Try to eat at least 2 vegetarian meals each week.  Eat more home-cooked food and less restaurant, buffet, and fast food.  When eating at a restaurant, ask that your food be prepared with less salt or no salt, if possible. What foods are recommended? The items listed may not be a complete list. Talk with your dietitian about what   dietary choices are best for you. Grains Whole-grain or whole-wheat bread. Whole-grain or whole-wheat pasta. Brown rice. Oatmeal. Quinoa. Bulgur. Whole-grain and low-sodium cereals. Pita bread. Low-fat, low-sodium crackers. Whole-wheat flour tortillas. Vegetables Fresh or frozen vegetables (raw, steamed, roasted, or grilled). Low-sodium or reduced-sodium tomato and vegetable juice. Low-sodium or reduced-sodium tomato sauce and tomato paste. Low-sodium or reduced-sodium canned vegetables. Fruits All fresh, dried, or frozen fruit. Canned fruit in natural juice (without  added sugar). Meat and other protein foods Skinless chicken or turkey. Ground chicken or turkey. Pork with fat trimmed off. Fish and seafood. Egg whites. Dried beans, peas, or lentils. Unsalted nuts, nut butters, and seeds. Unsalted canned beans. Lean cuts of beef with fat trimmed off. Low-sodium, lean deli meat. Dairy Low-fat (1%) or fat-free (skim) milk. Fat-free, low-fat, or reduced-fat cheeses. Nonfat, low-sodium ricotta or cottage cheese. Low-fat or nonfat yogurt. Low-fat, low-sodium cheese. Fats and oils Soft margarine without trans fats. Vegetable oil. Low-fat, reduced-fat, or light mayonnaise and salad dressings (reduced-sodium). Canola, safflower, olive, soybean, and sunflower oils. Avocado. Seasoning and other foods Herbs. Spices. Seasoning mixes without salt. Unsalted popcorn and pretzels. Fat-free sweets. What foods are not recommended? The items listed may not be a complete list. Talk with your dietitian about what dietary choices are best for you. Grains Baked goods made with fat, such as croissants, muffins, or some breads. Dry pasta or rice meal packs. Vegetables Creamed or fried vegetables. Vegetables in a cheese sauce. Regular canned vegetables (not low-sodium or reduced-sodium). Regular canned tomato sauce and paste (not low-sodium or reduced-sodium). Regular tomato and vegetable juice (not low-sodium or reduced-sodium). Pickles. Olives. Fruits Canned fruit in a light or heavy syrup. Fried fruit. Fruit in cream or butter sauce. Meat and other protein foods Fatty cuts of meat. Ribs. Fried meat. Bacon. Sausage. Bologna and other processed lunch meats. Salami. Fatback. Hotdogs. Bratwurst. Salted nuts and seeds. Canned beans with added salt. Canned or smoked fish. Whole eggs or egg yolks. Chicken or turkey with skin. Dairy Whole or 2% milk, cream, and half-and-half. Whole or full-fat cream cheese. Whole-fat or sweetened yogurt. Full-fat cheese. Nondairy creamers. Whipped toppings.  Processed cheese and cheese spreads. Fats and oils Butter. Stick margarine. Lard. Shortening. Ghee. Bacon fat. Tropical oils, such as coconut, palm kernel, or palm oil. Seasoning and other foods Salted popcorn and pretzels. Onion salt, garlic salt, seasoned salt, table salt, and sea salt. Worcestershire sauce. Tartar sauce. Barbecue sauce. Teriyaki sauce. Soy sauce, including reduced-sodium. Steak sauce. Canned and packaged gravies. Fish sauce. Oyster sauce. Cocktail sauce. Horseradish that you find on the shelf. Ketchup. Mustard. Meat flavorings and tenderizers. Bouillon cubes. Hot sauce and Tabasco sauce. Premade or packaged marinades. Premade or packaged taco seasonings. Relishes. Regular salad dressings. Where to find more information:  National Heart, Lung, and Blood Institute: www.nhlbi.nih.gov  American Heart Association: www.heart.org Summary  The DASH eating plan is a healthy eating plan that has been shown to reduce high blood pressure (hypertension). It may also reduce your risk for type 2 diabetes, heart disease, and stroke.  With the DASH eating plan, you should limit salt (sodium) intake to 2,300 mg a day. If you have hypertension, you may need to reduce your sodium intake to 1,500 mg a day.  When on the DASH eating plan, aim to eat more fresh fruits and vegetables, whole grains, lean proteins, low-fat dairy, and heart-healthy fats.  Work with your health care provider or diet and nutrition specialist (dietitian) to adjust your eating plan to your individual   calorie needs. This information is not intended to replace advice given to you by your health care provider. Make sure you discuss any questions you have with your health care provider. Document Released: 05/18/2011 Document Revised: 05/22/2016 Document Reviewed: 05/22/2016 Elsevier Interactive Patient Education  2018 Elsevier Inc.  

## 2017-09-12 ENCOUNTER — Ambulatory Visit: Payer: Self-pay | Admitting: Family Medicine

## 2017-09-14 ENCOUNTER — Encounter: Payer: Self-pay | Admitting: Family Medicine

## 2017-10-17 ENCOUNTER — Other Ambulatory Visit: Payer: Self-pay | Admitting: Family Medicine

## 2017-11-03 ENCOUNTER — Other Ambulatory Visit: Payer: Self-pay | Admitting: Family Medicine

## 2018-01-07 ENCOUNTER — Telehealth: Payer: Self-pay | Admitting: Family Medicine

## 2018-01-07 ENCOUNTER — Other Ambulatory Visit: Payer: Self-pay | Admitting: *Deleted

## 2018-01-07 MED ORDER — MOMETASONE FUROATE 0.1 % EX CREA: TOPICAL_CREAM | CUTANEOUS | 1 refills | Status: DC

## 2018-01-07 NOTE — Telephone Encounter (Signed)
Med sent to pharm. Pt notified.  

## 2018-01-07 NOTE — Telephone Encounter (Signed)
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  He is requesting Rx called in.  He seen Dr. Lorin Picket for this in 2018.   [facility]

## 2018-01-07 NOTE — Telephone Encounter (Signed)
Elocon cream applied twice daily as needed 45 g tube, 1 refill

## 2018-01-08 ENCOUNTER — Other Ambulatory Visit: Payer: Self-pay

## 2018-01-08 ENCOUNTER — Telehealth: Payer: Self-pay | Admitting: Family Medicine

## 2018-01-08 MED ORDER — CLOBETASOL PROPIONATE 0.05 % EX OINT: TOPICAL_OINTMENT | CUTANEOUS | 2 refills | Status: DC

## 2018-01-08 NOTE — Telephone Encounter (Signed)
Pt returned call and verbalized understanding  

## 2018-01-08 NOTE — Telephone Encounter (Signed)
Left message to return call. Medication sent to pharmacy 

## 2018-01-08 NOTE — Telephone Encounter (Signed)
Patient had Rx for Elocon cream called in for psoriasis on his elbow.  He said that this cream doesn't work for him and he would like something else called in.  [facility]

## 2018-01-08 NOTE — Telephone Encounter (Signed)
I would recommend Temovate ointment apply thin amount twice daily as needed not intended for facial area, may have 45 g tube with 2 refills Cancel the other

## 2018-01-08 NOTE — Telephone Encounter (Signed)
Pt states he has tried this in the past and not helped.

## 2018-03-25 ENCOUNTER — Other Ambulatory Visit: Payer: Self-pay | Admitting: Family Medicine

## 2018-03-25 ENCOUNTER — Telehealth: Payer: Self-pay | Admitting: Family Medicine

## 2018-03-25 DIAGNOSIS — Z13220 Encounter for screening for lipoid disorders: Secondary | ICD-10-CM

## 2018-03-25 DIAGNOSIS — I10 Essential (primary) hypertension: Secondary | ICD-10-CM

## 2018-03-25 DIAGNOSIS — Z79899 Other long term (current) drug therapy: Secondary | ICD-10-CM

## 2018-03-25 MED ORDER — LISINOPRIL 10 MG PO TABS: 10.0000 mg | ORAL_TABLET | Freq: Every day | ORAL | 3 refills | Status: DC

## 2018-03-25 NOTE — Telephone Encounter (Signed)
Patient will need lab work and office visit Requesting refills on his blood pressure medicine Current blood pressure 140/100 he ran out Medicine was sent in by myself We will send him papers regarding his lab work and he should do a follow-up office visit within 2 months  Nurses-please order lipid, liver, metabolic 7- send it to the patient along with a note having him do his lab work and having him call to schedule a follow-up office visit within 60 days thank you

## 2018-03-26 NOTE — Telephone Encounter (Signed)
bw orders put in the mail with a note to follow up with dr Lorin Picket in 2 months.

## 2018-03-26 NOTE — Addendum Note (Signed)
Addended by: Metro Kung on: 03/26/2018 10:39 AM   Modules accepted: Orders

## 2018-09-02 ENCOUNTER — Ambulatory Visit: Payer: Self-pay | Admitting: Family Medicine

## 2019-01-24 ENCOUNTER — Other Ambulatory Visit: Payer: Self-pay

## 2019-01-24 ENCOUNTER — Telehealth: Payer: Self-pay | Admitting: Family Medicine

## 2019-01-24 ENCOUNTER — Ambulatory Visit (INDEPENDENT_AMBULATORY_CARE_PROVIDER_SITE_OTHER): Payer: Self-pay | Admitting: Family Medicine

## 2019-01-24 DIAGNOSIS — J019 Acute sinusitis, unspecified: Secondary | ICD-10-CM

## 2019-01-24 DIAGNOSIS — H1013 Acute atopic conjunctivitis, bilateral: Secondary | ICD-10-CM

## 2019-01-24 MED ORDER — LISINOPRIL 5 MG PO TABS: 5.0000 mg | ORAL_TABLET | Freq: Every day | ORAL | 5 refills | Status: DC

## 2019-01-24 MED ORDER — OLOPATADINE HCL 0.1 % OP SOLN: 1.0000 [drp] | Freq: Two times a day (BID) | OPHTHALMIC | 12 refills | Status: DC

## 2019-01-24 MED ORDER — AMOXICILLIN 500 MG PO TABS: 500.0000 mg | ORAL_TABLET | Freq: Three times a day (TID) | ORAL | 0 refills | Status: DC

## 2019-01-24 NOTE — Telephone Encounter (Signed)
Front desk I noted that this patient is on the schedule -9:30 AM Friday-for a phone visit with something going on with his eyes If it is essentially related to his eyes and not having flulike symptoms it may be best for this patient to be seen in the office this at least needs to be offered to him thank you

## 2019-01-24 NOTE — Progress Notes (Signed)
   Subjective:    Patient ID: Nicolas [name], male    DOB: [birth year], 43 y.o.   MRN: [id number]  HPI  Patient calls to discuss problems with eyes for months. Patient states his eyes will swell up and drain for about a week at a time and then go back to normal. Been going on for months-currently almost back to normal Patient relates a lot of head congestion drainage coughing no wheezing or difficulty breathing relates eyes are puffy red irritated itching burning denies sweats or chills Virtual Visit via Video Note  I connected with Nicolas [name] on 01/24/19 at  9:30 AM EDT by a video enabled telemedicine application and verified that I am speaking with the correct person using two identifiers.  Location: Patient: home Provider: office   I discussed the limitations of evaluation and management by telemedicine and the availability of in person appointments. The patient expressed understanding and agreed to proceed.  History of Present Illness:    Observations/Objective:   Assessment and Plan:   Follow Up Instructions:    I discussed the assessment and treatment plan with the patient. The patient was provided an opportunity to ask questions and all were answered. The patient agreed with the plan and demonstrated an understanding of the instructions.   The patient was advised to call back or seek an in-person evaluation if the symptoms worsen or if the condition fails to improve as anticipated.  I provided 15 minutes of non-face-to-face time during this encounter.       Review of Systems  Constitutional: Negative for activity change, chills and fever.  HENT: Positive for congestion. Negative for ear pain and rhinorrhea.   Eyes: Positive for discharge and itching.  Respiratory: Negative for cough and wheezing.   Cardiovascular: Negative for chest pain.  Gastrointestinal: Negative for nausea and vomiting.  Musculoskeletal: Negative for arthralgias.       Objective:   Physical Exam   Patient had virtual visit Appears to be in no distress Atraumatic Neuro able to relate and oriented No apparent resp distress Color normal Erythematous eyes on eye exam.  Some redness along the bottom part of the eyes relates soreness and pain in the sinuses     Assessment & Plan:  Allergic rhinitis Acute rhinosinusitis Conjunctivitis allergic Prescription drops Antibiotics for sinus infection OTC allergy generic Claritin Follow-up if progressive troubles or worse

## 2019-01-24 NOTE — Telephone Encounter (Signed)
Discussed with nurse, patient to stay as a virtual appt on 01/24/19, for now.  Might not be allergies.

## 2019-02-20 ENCOUNTER — Telehealth: Payer: Self-pay | Admitting: Family Medicine

## 2019-02-20 ENCOUNTER — Other Ambulatory Visit: Payer: Self-pay | Admitting: Family Medicine

## 2019-02-20 MED ORDER — HYDROCODONE-HOMATROPINE 5-1.5 MG/5ML PO SYRP: 5.0000 mL | ORAL_SOLUTION | Freq: Four times a day (QID) | ORAL | 0 refills | Status: AC | PRN

## 2019-02-20 MED ORDER — SULFACETAMIDE SODIUM 10 % OP SOLN: OPHTHALMIC | 0 refills | Status: DC

## 2019-02-20 NOTE — Telephone Encounter (Signed)
He can try moist compresses to the eye I did send in some eyedrops Also I did send in cough medication he can use when he is at home over the next few days  This was sent into [facility]

## 2019-02-20 NOTE — Telephone Encounter (Signed)
Pt states he would like to have the Hycodan cough syrup sent in so he can take it at night and be able to sleep.   Pt also states that he is slightly congested and his eye became puffy last night, sore and itching also. Pt has been taking antibiotic for sinuses and using eye drops; was doing fine until last night when eye became puffy. Pt states he feels great; no fever; no nausea; nothing like that. Please advise. Thank you

## 2019-02-20 NOTE — Telephone Encounter (Signed)
Patient was seen 8/14 for sinus infection and ws put on antibiotic. He states sinus infection cleared up but now has a bad cough that he cant get rid of. He would like something called in for the cough at [facility].

## 2019-02-20 NOTE — Telephone Encounter (Signed)
So it is possible that the patient may still need a second round of antibiotics as well as medication for cough  As for medication for cough there is Tessalon which is a pill he can swallow that he can take 3 times a day as needed for cough does not cause drowsiness  Or there is Hycodan syrup which can be used for cough but can only be used when at home and is only intended for short-term use because it can cause drowsiness and and also contains hydrocodone which is not intended for long-term use  Also if his cough is not clearing up over the next couple weeks he ought to be seen  Please communicate with patient see what he would like to do

## 2019-02-21 NOTE — Telephone Encounter (Signed)
Discussed with pt. Pt verbalized understanding.  °

## 2019-05-20 ENCOUNTER — Other Ambulatory Visit: Payer: Self-pay | Admitting: Family Medicine

## 2019-05-30 ENCOUNTER — Telehealth: Payer: Self-pay | Admitting: Family Medicine

## 2019-05-30 MED ORDER — CLOBETASOL PROPIONATE 0.05 % EX OINT: TOPICAL_OINTMENT | CUTANEOUS | 2 refills | Status: DC

## 2019-05-30 NOTE — Telephone Encounter (Signed)
Pt last seen 01/24/2019. Please advise. Thank you

## 2019-05-30 NOTE — Telephone Encounter (Signed)
Ok plus 2 ref 

## 2019-05-30 NOTE — Telephone Encounter (Signed)
Medication sent to pharmacy. Left message to return call 

## 2019-05-30 NOTE — Telephone Encounter (Signed)
Patient is requesting refill on clobetasol-protionate 0.5 cream called into [place]

## 2019-05-30 NOTE — Telephone Encounter (Signed)
Pt returned call and verbalized understanding  

## 2019-09-08 ENCOUNTER — Telehealth: Payer: Self-pay | Admitting: Family Medicine

## 2019-09-08 ENCOUNTER — Ambulatory Visit (INDEPENDENT_AMBULATORY_CARE_PROVIDER_SITE_OTHER): Payer: Self-pay | Admitting: Family Medicine

## 2019-09-08 DIAGNOSIS — Z13220 Encounter for screening for lipoid disorders: Secondary | ICD-10-CM

## 2019-09-08 DIAGNOSIS — I10 Essential (primary) hypertension: Secondary | ICD-10-CM

## 2019-09-08 DIAGNOSIS — Z79899 Other long term (current) drug therapy: Secondary | ICD-10-CM

## 2019-09-08 DIAGNOSIS — N399 Disorder of urinary system, unspecified: Secondary | ICD-10-CM

## 2019-09-08 DIAGNOSIS — Z0000 Encounter for general adult medical examination without abnormal findings: Secondary | ICD-10-CM

## 2019-09-08 NOTE — Telephone Encounter (Signed)
Last labs completed on 03/26/18 BMET, LIPID and HEPATIC. Please advise. Thank you

## 2019-09-08 NOTE — Telephone Encounter (Signed)
Lab orders put in and pt was notified.  

## 2019-09-08 NOTE — Progress Notes (Signed)
   Subjective:    Patient ID: Nicolas [name], male    DOB: Sep 11, 1975, 44 y.o.   MRN: [id number]  HPI  Patient states he does not want to discuss his personal issues since I'm a lady, he only wants to discuss with the Doctor.  The patient relates that he has noticed that his penis is not the same as what it has been is seems at times like it stays more inside of him does not cause any difficulty with urination he states he has been trying to eat healthier and lose weight he relates that the ability to obtain an erection and denies any major setbacks.  States urinary function and sexual function are good Virtual Visit via Video Note  I connected with Nicolas [name] on 09/08/19 at  8:20 AM EDT by a video enabled telemedicine application and verified that I am speaking with the correct person using two identifiers.  Location: Patient: home Provider: office   I discussed the limitations of evaluation and management by telemedicine and the availability of in person appointments. The patient expressed understanding and agreed to proceed.  History of Present Illness:    Observations/Objective:   Assessment and Plan:   Follow Up Instructions:    I discussed the assessment and treatment plan with the patient. The patient was provided an opportunity to ask questions and all were answered. The patient agreed with the plan and demonstrated an understanding of the instructions.   The patient was advised to call back or seek an in-person evaluation if the symptoms worsen or if the condition fails to improve as anticipated.  I provided 10 minutes of non-face-to-face time during this encounter.     Review of Systems Denies dysuria hematuria urinary frequency sweats chills fevers    Objective:   Physical Exam  Virtual visit unable to do exam      Assessment & Plan:  Urinary issues I believe what he is describing to me sounds like some connective tissue changes not a sign of any type  of serious issue reassurance given  If ongoing troubles or problems notify us Otherwise do lab work and do a wellness visit by summertime

## 2019-09-08 NOTE — Telephone Encounter (Signed)
Patient is requesting labs for physical on 4/20.

## 2019-09-08 NOTE — Telephone Encounter (Signed)
Lipid, metabolic 7 

## 2019-09-30 ENCOUNTER — Encounter: Payer: Self-pay | Admitting: Family Medicine

## 2019-10-10 ENCOUNTER — Encounter: Payer: Self-pay | Admitting: Family Medicine

## 2019-10-16 ENCOUNTER — Emergency Department (HOSPITAL_COMMUNITY)
Admission: EM | Admit: 2019-10-16 | Discharge: 2019-10-17 | Disposition: A | Discharge status: Home / Self Care | Payer: PRIVATE HEALTH INSURANCE | Attending: Emergency Medicine | Admitting: Emergency Medicine

## 2019-10-16 ENCOUNTER — Other Ambulatory Visit: Payer: Self-pay

## 2019-10-16 ENCOUNTER — Encounter (HOSPITAL_COMMUNITY): Payer: Self-pay | Admitting: Emergency Medicine

## 2019-10-16 ENCOUNTER — Emergency Department (HOSPITAL_COMMUNITY): Payer: PRIVATE HEALTH INSURANCE

## 2019-10-16 DIAGNOSIS — I10 Essential (primary) hypertension: Secondary | ICD-10-CM | POA: Insufficient documentation

## 2019-10-16 DIAGNOSIS — M545 Low back pain, unspecified: Secondary | ICD-10-CM

## 2019-10-16 DIAGNOSIS — F17210 Nicotine dependence, cigarettes, uncomplicated: Secondary | ICD-10-CM | POA: Insufficient documentation

## 2019-10-16 MED ORDER — METHOCARBAMOL 750 MG PO TABS
750.0000 mg | ORAL_TABLET | Freq: Three times a day (TID) | ORAL | 0 refills | Status: AC
Start: 2019-10-16 — End: 2019-10-23

## 2019-10-16 MED ORDER — METHOCARBAMOL 500 MG PO TABS
750.0000 mg | ORAL_TABLET | Freq: Once | ORAL | Status: AC
  Administered 2019-10-17: 750 mg via ORAL
  Filled 2019-10-16: qty 2

## 2019-10-16 NOTE — ED Provider Notes (Signed)
[facility] EMERGENCY DEPARTMENT Provider Note   CSN: [id number] Arrival date & time: 10/16/19  2110     History Chief Complaint  Patient presents with  . Back Pain    Nicolas [name] is a 44 y.o. male presenting with right flank pain which has been present since he fell from the 4th rung of a ladder landing onto ground approximately 6-7 weeks ago.  He reports severe right sided flank pain when standing from either a lying or seated position, but once up has no difficulty standing, walking or performing most daily activities.  He does have mild soreness when he twists his torso.  He denies pain with deep inspiration, also denies sob, abdominal pain and has no pain or weakness in his legs, denies hematuria, urinary incontinence or retention.  He has had no medications or treatment for this problem. He also denies worsened pain, simply it is not getting better.  The history is provided by the patient.       Past Medical History:  Diagnosis Date  . History of stomach ulcers   . Hypertension   . Kidney stone    bil , different times  passed stones  . Migraines   . Pancreatitis     Patient Active Problem List   Diagnosis Date Noted  . Protein-calorie malnutrition, severe (HCC) 02/11/2014  . Peptic ulcer disease 02/10/2014  . Gastric ulcer 02/09/2014  . Nausea & vomiting 02/09/2014  . Nausea with vomiting 02/08/2014  . Tobacco use disorder 02/08/2014  . Obesity 02/01/2014  . Alcohol use 01/31/2014  . Acute pancreatitis 01/30/2014  . Musculoskeletal chest pain 01/19/2014  . Essential hypertension, benign 10/24/2012    Past Surgical History:  Procedure Laterality Date  . BIOPSY N/A 02/11/2014   Procedure: BIOPSY;  Surgeon: Corbin Ade, MD;  Location: [facility];  Service: Endoscopy;  Laterality: N/A;  . CARDIAC CATHETERIZATION  Aug 2015   normal EF, normal coronaries  . CARDIAC CATHETERIZATION    . ESOPHAGOGASTRODUODENOSCOPY (EGD) WITH PROPOFOL N/A 02/11/2014   Procedure:  ESOPHAGOGASTRODUODENOSCOPY (EGD) WITH PROPOFOL;  Surgeon: Corbin Ade, MD;  Location: [facility];  Service: Endoscopy;  Laterality: N/A;  . LEFT HEART CATHETERIZATION WITH CORONARY ANGIOGRAM N/A 01/18/2014   Procedure: LEFT HEART CATHETERIZATION WITH CORONARY ANGIOGRAM;  Surgeon: Lennette Bihari, MD;  Location: [facility] CATH LAB;  Service: Cardiovascular;  Laterality: N/A;       Family History  Problem Relation Age of Onset  . Cancer Mother        Breast  . Colon cancer Neg Hx     Social History   Tobacco Use  . Smoking status: Current Every Day Smoker    Packs/day: 2.00    Years: 10.00    Pack years: 20.00    Types: Cigarettes  . Smokeless tobacco: Never Used  Substance Use Topics  . Alcohol use: No  . Drug use: No    Home Medications Prior to Admission medications   Medication Sig Start Date End Date Taking? Authorizing Provider  olopatadine (PATANOL) 0.1 % ophthalmic solution Place 1 drop into both eyes 2 (two) times daily. Patient taking differently: Place 1 drop into both eyes 2 (two) times daily as needed for allergies.  01/24/19  Yes [name], MD  methocarbamol (ROBAXIN-750) 750 MG tablet Take 1 tablet (750 mg total) by mouth 3 (three) times daily for 7 days. 10/16/19 10/23/19  Burgess Amor, PA-C    Allergies    Patient has no known allergies.  Review of Systems   Review of Systems  Constitutional: Negative for fever.  Respiratory: Negative for shortness of breath.   Cardiovascular: Negative for chest pain and leg swelling.  Gastrointestinal: Negative for abdominal distention, abdominal pain and constipation.  Genitourinary: Positive for flank pain. Negative for difficulty urinating, dysuria, frequency and urgency.  Musculoskeletal: Positive for back pain. Negative for gait problem and joint swelling.  Skin: Negative for rash.  Neurological: Negative for weakness and numbness.    Physical Exam Updated Vital Signs BP (!) 158/86 (BP Location: Right Arm)   Pulse 84    Temp 98 F (36.7 C) (Oral)   Resp 15   Ht 6\' 3"  (1.905 m)   Wt 122.5 kg   SpO2 97%   BMI 33.75 kg/m   Physical Exam Vitals and nursing note reviewed.  Constitutional:      Appearance: He is well-developed.  HENT:     Head: Normocephalic.  Eyes:     Conjunctiva/sclera: Conjunctivae normal.  Cardiovascular:     Rate and Rhythm: Normal rate.  Pulmonary:     Effort: Pulmonary effort is normal.  Abdominal:     General: Bowel sounds are normal. There is no distension.     Palpations: Abdomen is soft. There is no mass.  Musculoskeletal:        General: Normal range of motion.     Cervical back: Normal range of motion and neck supple.     Lumbar back: Tenderness present. No swelling, edema or spasms.       Back:     Comments: Mild ttp right flank, no crepitus, swelling or deformity. No midline lumbar or thoracic pain.   Skin:    General: Skin is warm and dry.  Neurological:     Mental Status: He is alert.     Sensory: No sensory deficit.     Motor: Motor function is intact. No tremor, atrophy or abnormal muscle tone.     Gait: Gait normal.     Deep Tendon Reflexes:     Reflex Scores:      Patellar reflexes are 2+ on the right side and 2+ on the left side.    Comments: No strength deficit noted in hip and knee flexor and extensor muscle groups.  Ankle flexion and extension intact.     ED Results / Procedures / Treatments   Labs (all labs ordered are listed, but only abnormal results are displayed) Labs Reviewed - No data to display  EKG None  Radiology DG Ribs Unilateral W/Chest Right  Result Date: 10/16/2019 CLINICAL DATA:  [place] off ladder 6 weeks ago, right rib pain EXAM: RIGHT RIBS AND CHEST - 3+ VIEW COMPARISON:  01/19/2014 FINDINGS: Frontal and oblique views of the right thoracic cage are obtained. There are no acute displaced fractures. Cardiac silhouette is unremarkable. No airspace disease, effusion, or pneumothorax. IMPRESSION: 1. No acute intrathoracic  process. Electronically Signed   By: Randa Ngo M.D.   On: 10/16/2019 22:49   DG Lumbar Spine Complete  Result Date: 10/16/2019 CLINICAL DATA:  [place] off ladder 6 weeks ago, continued right lower back pain EXAM: LUMBAR SPINE - COMPLETE 4+ VIEW COMPARISON:  06/30/2017 FINDINGS: Frontal, bilateral oblique, lateral views of the lumbar spine are obtained. There are 5 non-rib-bearing lumbar type vertebral bodies in grossly normal alignment. No acute displaced fractures. Disc spaces are well preserved. Mild facet hypertrophy at the lumbosacral junction. Visualized portions of the bony pelvis are unremarkable. IMPRESSION: 1. No acute lumbar spine fracture.  Electronically Signed   By: Sharlet Salina M.D.   On: 10/16/2019 22:48    Procedures Procedures (including critical care time)  Medications Ordered in ED Medications  methocarbamol (ROBAXIN) tablet 750 mg (750 mg Oral Given 10/17/19 0002)    ED Course  I have reviewed the triage vital signs and the nursing notes.  Pertinent labs & imaging results that were available during my care of the patient were reviewed by me and considered in my medical decision making (see chart for details).    MDM Rules/Calculators/A&P                      Pt with persistent positional pain in the right lower back and flank region almost 2 months after a fall.  No acute or subacute bony injuries noted on plain film imaging.  He is ambulatory, no neuro deficits per hx or exam.  Suspect possible soft tissue/body wall vs deep muscular injury.  He would probably benefit from MRI imaging to further assess for such injury on non emergent basis.  Encouraged f/u with his pcp for eval of this injury which he had not done prior to today.  Discussed heat tx, added robaxin, advised adding ibuprofen also for sx relief.   Final Clinical Impression(s) / ED Diagnoses Final diagnoses:  Acute right-sided low back pain without sciatica    Rx / DC Orders ED Discharge Orders          Ordered    methocarbamol (ROBAXIN-750) 750 MG tablet  3 times daily     10/16/19 2341           Burgess Amor, PA-C 10/17/19 1357    Gilda Crease, MD 10/24/19 [phone]

## 2019-10-16 NOTE — ED Triage Notes (Signed)
Patient fell off a ladder 6-7 weeks ago and is still experiencing sharp back and flank pain.

## 2019-10-16 NOTE — Discharge Instructions (Signed)
Your plain film xrays are negative for obvious bony injury (no fractures including ribs or dislocations).  However, as discussed, with this persistent pain this far out from your injury you would benefit from an MRI which will better assess for soft tissue injury (muscles, ligaments, and the disks in your spine).  Call Dr. Gerda Diss for an office visit to discuss having this test done. I recommend a heating pad applied to the site of pain 20 minutes several times daily (ideally before getting out of bed in the morning to start your day, which may help loosen your muscles before you get up).  You can also take ibuprofen (motrin).  I have prescribed a muscle relaxer which may be helpful - use caution as this can cause mild drowsiness when taking.

## 2019-10-17 ENCOUNTER — Telehealth: Payer: Self-pay | Admitting: Family Medicine

## 2019-10-17 NOTE — Telephone Encounter (Signed)
It would be necessary for patient to do an office visit for evaluation Also it should be noted that the MRI may or may not be needed Also insurance companies can often have protocols on when the MRI is indicated or when it will be covered  Needless to say this is well too complicated to be able to do a phone visit would need a office visit

## 2019-10-17 NOTE — Telephone Encounter (Signed)
Patient was seen in ER last night and was told to go through his primary care to get a referral for MRI for left-side pain. Please advise

## 2019-10-20 NOTE — Telephone Encounter (Signed)
Patient states the muscle relaxer and heating pad is helping him out some should he finish the med and then come in or come in now for MRI- Patient states he is self pay for the MRI

## 2019-10-20 NOTE — Telephone Encounter (Signed)
I would recommend muscle relaxer massage stretching for now if that does not get the issue to go away then follow-up office visit and we can evaluate his issue and if needed order MRI

## 2019-10-20 NOTE — Telephone Encounter (Signed)
Patient advised Dr Lorin Picket would recommend muscle relaxer, massage, stretching for now if that does not get the issue to go away then follow-up office visit and we can evaluate his issue and if needed order MRI. Patient verbalized understanding.

## 2019-10-21 ENCOUNTER — Encounter (HOSPITAL_COMMUNITY): Payer: Self-pay

## 2019-10-21 ENCOUNTER — Emergency Department (HOSPITAL_COMMUNITY)
Admission: EM | Admit: 2019-10-21 | Discharge: 2019-10-21 | Disposition: A | Discharge status: Home / Self Care | Payer: Self-pay | Attending: Emergency Medicine | Admitting: Emergency Medicine

## 2019-10-21 ENCOUNTER — Other Ambulatory Visit: Payer: Self-pay

## 2019-10-21 DIAGNOSIS — M545 Low back pain: Secondary | ICD-10-CM | POA: Insufficient documentation

## 2019-10-21 DIAGNOSIS — I10 Essential (primary) hypertension: Secondary | ICD-10-CM | POA: Insufficient documentation

## 2019-10-21 DIAGNOSIS — F17210 Nicotine dependence, cigarettes, uncomplicated: Secondary | ICD-10-CM | POA: Insufficient documentation

## 2019-10-21 DIAGNOSIS — G8929 Other chronic pain: Secondary | ICD-10-CM

## 2019-10-21 MED ORDER — CYCLOBENZAPRINE HCL 10 MG PO TABS
10.0000 mg | ORAL_TABLET | Freq: Two times a day (BID) | ORAL | 0 refills | Status: DC | PRN
Start: 2019-10-21 — End: 2020-05-04

## 2019-10-21 MED ORDER — IBUPROFEN 800 MG PO TABS
800.0000 mg | ORAL_TABLET | Freq: Three times a day (TID) | ORAL | 0 refills | Status: DC
Start: 2019-10-21 — End: 2019-11-19

## 2019-10-21 MED ORDER — METHOCARBAMOL 500 MG PO TABS
1000.0000 mg | ORAL_TABLET | Freq: Once | ORAL | Status: AC
  Administered 2019-10-21: 1000 mg via ORAL
  Filled 2019-10-21: qty 2

## 2019-10-21 MED ORDER — KETOROLAC TROMETHAMINE 60 MG/2ML IM SOLN
60.0000 mg | Freq: Once | INTRAMUSCULAR | Status: AC
  Administered 2019-10-21: 60 mg via INTRAMUSCULAR
  Filled 2019-10-21: qty 2

## 2019-10-21 NOTE — ED Triage Notes (Signed)
Pt presents to ED with complaints of continued lower back pain radiates around to abdomen.

## 2019-10-21 NOTE — ED Provider Notes (Signed)
[facility] EMERGENCY DEPARTMENT Provider Note   CSN: [id number] Arrival date & time: 10/21/19  1706     History Chief Complaint  Patient presents with  . Back Pain    Nicolas [name] is a 44 y.o. male.  HPI 44 year old male with a history of hypertension, pancreatitis, alcohol use, obesity presents to the ER with worsening back pain since Friday.  Patient was seen in the ER on 10/16/2019 for similar complaint.  He reports falling off a fourth round of the ladder landing onto the ground approximately 8 to 9 weeks ago.  He has been having worsening back pain which brought him to the ER on 5/6, imaging of L-spine and ribs was negative for acute fractures or any other acute pathology.  Patient discharged with Robaxin, heat/ice, instructions for close follow-up with PCP Dr. Sallee Lange.  Patient reached out to PCP yesterday about MRI, PCP recommended continuing muscle relaxers, ice and stretching and heat and evaluate improvement.  Patient notes that his pain has severely worsened over the last few days, spreading to his left side as well and radiating more out to his ribs bilaterally.  He denies any fevers, chills, groin numbness, bowel or bladder incontinence,    Past Medical History:  Diagnosis Date  . History of stomach ulcers   . Hypertension   . Kidney stone    bil , different times  passed stones  . Migraines   . Pancreatitis     Patient Active Problem List   Diagnosis Date Noted  . Protein-calorie malnutrition, severe ([place]) 02/11/2014  . Peptic ulcer disease 02/10/2014  . Gastric ulcer 02/09/2014  . Nausea & vomiting 02/09/2014  . Nausea with vomiting 02/08/2014  . Tobacco use disorder 02/08/2014  . Obesity 02/01/2014  . Alcohol use 01/31/2014  . Acute pancreatitis 01/30/2014  . Musculoskeletal chest pain 01/19/2014  . Essential hypertension, benign 10/24/2012    Past Surgical History:  Procedure Laterality Date  . BIOPSY N/A 02/11/2014   Procedure: BIOPSY;  Surgeon:  Daneil Dolin, MD;  Location: [facility];  Service: Endoscopy;  Laterality: N/A;  . CARDIAC CATHETERIZATION  Aug 2015   normal EF, normal coronaries  . CARDIAC CATHETERIZATION    . ESOPHAGOGASTRODUODENOSCOPY (EGD) WITH PROPOFOL N/A 02/11/2014   Procedure: ESOPHAGOGASTRODUODENOSCOPY (EGD) WITH PROPOFOL;  Surgeon: Daneil Dolin, MD;  Location: [facility];  Service: Endoscopy;  Laterality: N/A;  . LEFT HEART CATHETERIZATION WITH CORONARY ANGIOGRAM N/A 01/18/2014   Procedure: LEFT HEART CATHETERIZATION WITH CORONARY ANGIOGRAM;  Surgeon: Troy Sine, MD;  Location: [facility] CATH LAB;  Service: Cardiovascular;  Laterality: N/A;       Family History  Problem Relation Age of Onset  . Cancer Mother        Breast  . Colon cancer Neg Hx     Social History   Tobacco Use  . Smoking status: Current Every Day Smoker    Packs/day: 2.00    Years: 10.00    Pack years: 20.00    Types: Cigarettes  . Smokeless tobacco: Never Used  Substance Use Topics  . Alcohol use: No  . Drug use: No    Home Medications Prior to Admission medications   Medication Sig Start Date End Date Taking? Authorizing Provider  methocarbamol (ROBAXIN-750) 750 MG tablet Take 1 tablet (750 mg total) by mouth 3 (three) times daily for 7 days. 10/16/19 10/23/19 Yes Idol, Almyra Free, PA-C  olopatadine (PATANOL) 0.1 % ophthalmic solution Place 1 drop into both eyes 2 (two) times daily.  Patient taking differently: Place 1 drop into both eyes 2 (two) times daily as needed for allergies.  01/24/19  Yes [name], MD  cyclobenzaprine (FLEXERIL) 10 MG tablet Take 1 tablet (10 mg total) by mouth 2 (two) times daily as needed for muscle spasms. 10/21/19   Mare Ferrari, PA-C  ibuprofen (ADVIL) 800 MG tablet Take 1 tablet (800 mg total) by mouth 3 (three) times daily. 10/21/19   Mare Ferrari, PA-C    Allergies    Patient has no known allergies.  Review of Systems   Review of Systems  Constitutional: Negative for chills and fever.  HENT:  Negative for ear pain and sore throat.   Eyes: Negative for pain and visual disturbance.  Respiratory: Negative for cough and shortness of breath.   Cardiovascular: Negative for chest pain and palpitations.  Gastrointestinal: Negative for abdominal pain and vomiting.  Genitourinary: Negative for dysuria and hematuria.  Musculoskeletal: Positive for back pain. Negative for arthralgias.  Skin: Negative for color change and rash.  Neurological: Negative for dizziness, seizures, syncope, weakness, light-headedness and numbness.  Psychiatric/Behavioral: Negative for confusion.  All other systems reviewed and are negative.   Physical Exam Updated Vital Signs BP (!) 164/109 (BP Location: Right Arm)   Pulse 86   Temp 98.4 F (36.9 C) (Oral)   Resp 16   Ht 6\' 3"  (1.905 m)   Wt 122.5 kg   SpO2 99%   BMI 33.75 kg/m   Physical Exam Vitals and nursing note reviewed.  Constitutional:      Appearance: Normal appearance. He is well-developed and normal weight.  HENT:     Head: Normocephalic and atraumatic.     Mouth/Throat:     Mouth: Mucous membranes are moist.     Pharynx: Oropharynx is clear.  Eyes:     Conjunctiva/sclera: Conjunctivae normal.  Cardiovascular:     Rate and Rhythm: Normal rate and regular rhythm.     Heart sounds: No murmur.  Pulmonary:     Effort: Pulmonary effort is normal. No respiratory distress.     Breath sounds: Normal breath sounds.  Abdominal:     Palpations: Abdomen is soft.     Tenderness: There is no abdominal tenderness.  Musculoskeletal:        General: Tenderness present. No swelling. Normal range of motion.     Cervical back: Neck supple.     Right lower leg: No edema.     Left lower leg: No edema.     Comments: Bilateral L-spine paraspinal point tenderness, R>L.  No L-spine, T-spine, C-spine tenderness.  5/5 strength and sensations intact in upper and lower extremities.  Patient ambulated in the ER without evidence of foot drop, weakness with  mild limp symptom secondary to pain.  No evidence of rashes, fluctuance, step-offs, crepitus   Skin:    General: Skin is warm and dry.     Capillary Refill: Capillary refill takes less than 2 seconds.  Neurological:     General: No focal deficit present.     Mental Status: He is alert and oriented to person, place, and time.  Psychiatric:        Mood and Affect: Mood normal.        Behavior: Behavior normal.     ED Results / Procedures / Treatments   Labs (all labs ordered are listed, but only abnormal results are displayed) Labs Reviewed - No data to display  EKG None  Radiology No results found.  Procedures  Procedures (including critical care time)  Medications Ordered in ED Medications  ketorolac (TORADOL) injection 60 mg (60 mg Intramuscular Given 10/21/19 1758)  methocarbamol (ROBAXIN) tablet 1,000 mg (1,000 mg Oral Given 10/21/19 1757)    ED Course  I have reviewed the triage vital signs and the nursing notes.  Pertinent labs & imaging results that were available during my care of the patient were reviewed by me and considered in my medical decision making (see chart for details).    MDM Rules/Calculators/A&P                      44 year old male with repeat visit for low back pain. Normal neurological exam, no evidence of urinary incontinence or retention, point tenderness is consistently reproducible. There is no evidence of AAA or concern for dissection at this time as he has no chest pain, dizziness, syncope, nausea, vomiting.   Patient can walk but states is painful.  No loss of bowel or bladder control.  No concern for cauda equina.  No fever, night sweats, weight loss, h/o cancer, IVDU.  Pain treated here in the department with Toradol and Robaxin with adequate improvement.  I discussed the emergent reasons for an MRI for low back pain in the ER with the patient, which I do not feel is indicated at this time.  Encouraged stretching, as the patient states that he  is in too much pain and has not been stretching as recommended.  As discussed in his previous visit, he could probably benefit from a outpatient MRI.  In the meantime we will try Flexeril instead of Robaxin, patient instructed to not drink or drive on this medication and to take this at night secondary to drowsy side effects.  I have also prescribed him 800 mg of ibuprofen.  Continued to educate him on RICE protocol.  Strict return precautions given, educated on red flag signs.  At this stage in the ED course, the patient is medically screened and is stable for discharge.  I discussed the case with Dr. Hyacinth Meeker and he is agreeable to the above plan.  Final Clinical Impression(s) / ED Diagnoses Final diagnoses:  Chronic bilateral low back pain without sciatica    Rx / DC Orders ED Discharge Orders         Ordered    cyclobenzaprine (FLEXERIL) 10 MG tablet  2 times daily PRN     10/21/19 1742    ibuprofen (ADVIL) 800 MG tablet  3 times daily     10/21/19 1742           Leone Brand 10/21/19 1850    Eber Hong, MD 10/22/19 1450

## 2019-10-21 NOTE — Discharge Instructions (Addendum)
Please make sure to follow up with your primary care doctor tomorrow.  I have provided a handout for low back stretches, continue to take prescribed ibuprofen, muscle relaxers, heat/ice, stretching. Take muscle relaxer at night, do not drink or drive while taking this medication    Back Pain:  Back pain is very common.  The pain often gets better over time.  The cause of back pain is usually not dangerous.  Most people can learn to manage their back pain on their own.  However if you develop severe or worsening pain, low back pain with fever, numbness, weakness or inability to walk or urinate/stool, you should return to the ER immediately.  Please follow up with your doctor this week for a recheck if still having symptoms.  Low back pain is discomfort in the lower back that may be due to injuries to muscles and ligaments around the spine.  Occasionally, it may be caused by a a problem to a part of the spine called a disc. The pain may last several days or a week;  However, most patients get completely well in 4 weeks.  Medications are also useful to help with pain control.  A commonly prescribed medications includes acetaminophen.  This medication is generally safe, though you should not take more than 8 of the extra strength (500mg ) pills a day.  Non steroidal anti inflammatory medications including Ibuprofen and naproxen;  These medications help both pain and swelling and are very useful in treating back pain.  They should be taken with food, as they can cause stomach upset, and more seriously, stomach bleeding.    Be aware that if you develop new symptoms, such as a fever, leg weakness, difficulty with or loss of control of your urine or bowels, abdominal pain, or more severe pain, you will need to seek medical attention and  / or return to the Emergency department.    Home Care Stay active.  Start with short walks on flat ground if you can.  Try to walk farther each day. Do not sit, drive or  stand in one place for more than 30 minutes.  Do not stay in bed. Do not avoid exercise or work.  Activity can help your back heal faster. Be careful when you bend or lift an object.  Bend at your knees, keep the object close to you, and do not twist. Sleep on a firm mattress.  Lie on your side, and bend your knees.  If you lie on your back, put a pillow under your knees. Only take medicines as told by your doctor. Put ice on the injured area. Put ice in a plastic bag Place a towel between your skin and the bag Leave the ice on for 15-20 minutes, 3-4 times a day for the first 2-3 days. 210 After that, you can switch between ice and heat packs. Ask your doctor about back exercises or massage. Avoid feeling anxious or stressed.  Find good ways to deal with stress, such as exercise.  Get Help Right Way If: Your pain does not go away with rest or medicine. Your pain does not go away in 1 week. You have new problems. You do not feel well. The pain spreads into your legs. You cannot control when you poop (bowel movement) or pee (urinate) You feel sick to your stomach (nauseous) or throw up (vomit) You have belly (abdominal) pain. You feel like you may pass out (faint). If you develop a fever.  Make Sure  you: Understand these instructions. Watch your condition Get help right away if you are not doing well or get worse.

## 2019-10-21 NOTE — ED Notes (Signed)
Pt here for recheck of his continued back pain   Seen here earlier this month

## 2019-11-18 ENCOUNTER — Telehealth: Payer: Self-pay | Admitting: Family Medicine

## 2019-11-18 NOTE — Telephone Encounter (Signed)
Pt contacted and transferred up front to schedule appt for tomorrow.

## 2019-11-18 NOTE — Telephone Encounter (Signed)
Pt wife contacted office and states that patient is having right side pain. Sometimes does radiate to back. No issues with urination, no swelling. Pt has been to ER on 10/16/19 and 10/21/19 for this issue. Pt has been trying muscle relaxer and head. Helps some but is still is pain. Per 10/17/19 telephone message, follow up if no better. With the schedule being full today and almost full tomorrow, what should pt do? Please advise. Thank you.  May call patient back [phone]  [facility]

## 2019-11-18 NOTE — Telephone Encounter (Signed)
Go ahead with appointment for tomorrow thank you

## 2019-11-19 ENCOUNTER — Other Ambulatory Visit: Payer: Self-pay

## 2019-11-19 ENCOUNTER — Ambulatory Visit (INDEPENDENT_AMBULATORY_CARE_PROVIDER_SITE_OTHER): Payer: Self-pay | Admitting: Family Medicine

## 2019-11-19 VITALS — BP 134/88 | Temp 97.3°F | Wt 269.2 lb

## 2019-11-19 DIAGNOSIS — Z13220 Encounter for screening for lipoid disorders: Secondary | ICD-10-CM

## 2019-11-19 DIAGNOSIS — I10 Essential (primary) hypertension: Secondary | ICD-10-CM

## 2019-11-19 DIAGNOSIS — M549 Dorsalgia, unspecified: Secondary | ICD-10-CM

## 2019-11-19 MED ORDER — DICLOFENAC SODIUM 75 MG PO TBEC
75.0000 mg | DELAYED_RELEASE_TABLET | Freq: Two times a day (BID) | ORAL | 0 refills | Status: DC
Start: 2019-11-19 — End: 2020-01-19

## 2019-11-19 MED ORDER — AMLODIPINE BESYLATE 2.5 MG PO TABS
2.5000 mg | ORAL_TABLET | Freq: Every day | ORAL | 3 refills | Status: DC
Start: 2019-11-19 — End: 2020-01-19

## 2019-11-19 NOTE — Progress Notes (Signed)
   Subjective:    Patient ID: Nicolas [name], male    DOB: 07/05/1975, 44 y.o.   MRN: [id number]  HPI Patient comes in today to follow up on right side pain x 2 months. Pain occasionally radiates to his back. Patient states he doesn't hurt when standing or when lying completely down but getting from position to position or stretching the area at all is when it hurts.  Has had some nausea when pain was bad enough.   Review of Systems     Objective:   Physical Exam Blood pressure borderline elevated Lungs clear respiratory rate normal heart regular abdomen soft no guarding rebound or tenderness no low back tenderness negative straight leg raise. Tenderness in the right lower rib on the right side on the side and anterior and back region on the bottom part of the rib     Assessment & Plan:  Severe pain ongoing in the right rib region on the right side.  Underneath the ribs.  With some tenderness in the region of the liver.  It is mainly musculoskeletal.  The pain increases with rotation and bending to the side. We will try anti-inflammatory.  If this does not do enough next step would be lab work and ultrasound.  Patient will give [place] feedback within the next 2 weeks  Blood pressure mild elevation patient needs to take medication watch diet repeat blood pressure again in 1 month

## 2019-12-23 ENCOUNTER — Ambulatory Visit: Payer: Self-pay | Admitting: Family Medicine

## 2020-01-19 ENCOUNTER — Other Ambulatory Visit: Payer: Self-pay

## 2020-01-19 ENCOUNTER — Ambulatory Visit (INDEPENDENT_AMBULATORY_CARE_PROVIDER_SITE_OTHER): Payer: Self-pay | Admitting: Family Medicine

## 2020-01-19 VITALS — BP 140/90 | Temp 97.5°F | Ht 75.0 in | Wt 270.8 lb

## 2020-01-19 DIAGNOSIS — M549 Dorsalgia, unspecified: Secondary | ICD-10-CM

## 2020-01-19 DIAGNOSIS — I10 Essential (primary) hypertension: Secondary | ICD-10-CM

## 2020-01-19 MED ORDER — SULFACETAMIDE SODIUM 10 % OP SOLN
OPHTHALMIC | 0 refills | Status: DC
Start: 2020-01-19 — End: 2020-05-04

## 2020-01-19 MED ORDER — DICLOFENAC SODIUM 75 MG PO TBEC: 75.0000 mg | DELAYED_RELEASE_TABLET | Freq: Two times a day (BID) | ORAL | 2 refills | Status: DC

## 2020-01-19 MED ORDER — AMLODIPINE BESYLATE 5 MG PO TABS
5.0000 mg | ORAL_TABLET | Freq: Every day | ORAL | 3 refills | Status: DC
Start: 2020-01-19 — End: 2020-06-08

## 2020-01-19 MED ORDER — OLOPATADINE HCL 0.1 % OP SOLN: 1.0000 [drp] | Freq: Two times a day (BID) | OPHTHALMIC | 12 refills | Status: DC

## 2020-01-19 NOTE — Progress Notes (Signed)
   Subjective:    Patient ID: Nicolas [name], male    DOB: 1976/04/24, 44 y.o.   MRN: [id number]  HPI  Patient arrives for a follow up on elevated blood pressure.  Patient does take his medicine regular basis.  Does not exercise.  But does open up a lot of physical activity with his job patient continues to smoke he has been counseled to quit  Patient states he is still having problems with his back and back pain and out of the medications from last visit.  Anti-inflammatories helped him.  He denies radiation down the leg relates it mainly is a low back pain worse with activity  Review of Systems  Constitutional: Negative for diaphoresis and fatigue.  HENT: Negative for congestion and rhinorrhea.   Respiratory: Negative for cough and shortness of breath.   Cardiovascular: Negative for chest pain and leg swelling.  Gastrointestinal: Negative for abdominal pain and diarrhea.  Skin: Negative for color change and rash.  Neurological: Negative for dizziness and headaches.  Psychiatric/Behavioral: Negative for behavioral problems and confusion.       Objective:   Physical Exam Vitals reviewed.  Constitutional:      General: He is not in acute distress. HENT:     Head: Normocephalic and atraumatic.  Eyes:     General:        Right eye: No discharge.        Left eye: Discharge present. Neck:     Trachea: No tracheal deviation.  Cardiovascular:     Rate and Rhythm: Normal rate and regular rhythm.     Heart sounds: Normal heart sounds. No murmur heard.   Pulmonary:     Effort: Pulmonary effort is normal. No respiratory distress.     Breath sounds: Normal breath sounds.  Lymphadenopathy:     Cervical: No cervical adenopathy.  Skin:    General: Skin is warm and dry.  Neurological:     Mental Status: He is alert.     Coordination: Coordination normal.  Psychiatric:        Behavior: Behavior normal.     Patient also complains of mild left eye conjunctivitis and intermittent  allergy issues      Assessment & Plan:  Lumbar back pain Recommend stretching exercises on a regular basis May use anti-inflammatory twice a day as needed patient works in [employer] and uses his back a lot denies any sciatica no weakness  Blood pressure subpar control increase amlodipine to 5 mg daily.  Left eye conjunctivitis may use allergy drops may use antibiotic drops over the course of the next several days  Follow-up in several months to recheck blood pressure  Patient to do lab work in the near future

## 2020-01-19 NOTE — Patient Instructions (Signed)
We did increase the dose of your blood pressure medicine to get this under better control  Please work hard at quitting smoking Please consider getting the Covid vaccine Moderna available at [employer]  Follow-up in 3 to 4 months Take[facility]

## 2020-02-05 ENCOUNTER — Encounter (HOSPITAL_COMMUNITY): Payer: Self-pay | Admitting: Emergency Medicine

## 2020-02-05 ENCOUNTER — Telehealth: Payer: Self-pay | Admitting: Family Medicine

## 2020-02-05 ENCOUNTER — Emergency Department (HOSPITAL_COMMUNITY)
Admission: EM | Admit: 2020-02-05 | Discharge: 2020-02-05 | Disposition: A | Discharge status: Home / Self Care | Payer: Self-pay | Attending: Emergency Medicine | Admitting: Emergency Medicine

## 2020-02-05 ENCOUNTER — Emergency Department (HOSPITAL_COMMUNITY): Payer: Self-pay

## 2020-02-05 ENCOUNTER — Other Ambulatory Visit: Payer: Self-pay

## 2020-02-05 DIAGNOSIS — F17210 Nicotine dependence, cigarettes, uncomplicated: Secondary | ICD-10-CM | POA: Insufficient documentation

## 2020-02-05 DIAGNOSIS — R109 Unspecified abdominal pain: Secondary | ICD-10-CM | POA: Insufficient documentation

## 2020-02-05 DIAGNOSIS — I10 Essential (primary) hypertension: Secondary | ICD-10-CM | POA: Insufficient documentation

## 2020-02-05 DIAGNOSIS — Z79899 Other long term (current) drug therapy: Secondary | ICD-10-CM | POA: Insufficient documentation

## 2020-02-05 DIAGNOSIS — R319 Hematuria, unspecified: Secondary | ICD-10-CM

## 2020-02-05 LAB — BASIC METABOLIC PANEL
Anion gap: 9 (ref 5–15)
BUN: 21 mg/dL — ABNORMAL HIGH (ref 6–20)
CO2: 25 mmol/L (ref 22–32)
Calcium: 9 mg/dL (ref 8.9–10.3)
Chloride: 104 mmol/L (ref 98–111)
Creatinine, Ser: 0.86 mg/dL (ref 0.61–1.24)
GFR calc Af Amer: >60 mL/min (ref >60)
GFR calc non Af Amer: >60 mL/min (ref >60)
Glucose, Bld: 111 mg/dL — ABNORMAL HIGH (ref 70–99)
Potassium: 3.9 mmol/L (ref 3.5–5.1)
Sodium: 138 mmol/L (ref 135–145)

## 2020-02-05 LAB — URINALYSIS, ROUTINE W REFLEX MICROSCOPIC
Bacteria, UA: NONE SEEN
Glucose, UA: NEGATIVE mg/dL
Ketones, ur: NEGATIVE mg/dL
Leukocytes,Ua: NEGATIVE
Nitrite: NEGATIVE
Protein, ur: 100 mg/dL — AB
RBC / HPF: >50 RBC/hpf — ABNORMAL HIGH (ref 0–5)
Specific Gravity, Urine: 1.028 (ref 1.005–1.030)
pH: 5 (ref 5.0–8.0)

## 2020-02-05 LAB — CBC WITH DIFFERENTIAL/PLATELET
Abs Immature Granulocytes: 0.04 10*3/uL (ref 0.00–0.07)
Basophils Absolute: 0.1 10*3/uL (ref 0.0–0.1)
Basophils Relative: 1 %
Eosinophils Absolute: 0.6 10*3/uL — ABNORMAL HIGH (ref 0.0–0.5)
Eosinophils Relative: 6 %
HCT: 47.2 % (ref 39.0–52.0)
Hemoglobin: 16.2 g/dL (ref 13.0–17.0)
Immature Granulocytes: 0 %
Lymphocytes Relative: 37 %
Lymphs Abs: 3.7 10*3/uL (ref 0.7–4.0)
MCH: 31.8 pg (ref 26.0–34.0)
MCHC: 34.3 g/dL (ref 30.0–36.0)
MCV: 92.7 fL (ref 80.0–100.0)
Monocytes Absolute: 0.8 10*3/uL (ref 0.1–1.0)
Monocytes Relative: 8 %
Neutro Abs: 4.9 10*3/uL (ref 1.7–7.7)
Neutrophils Relative %: 48 %
Platelets: 298 10*3/uL (ref 150–400)
RBC: 5.09 MIL/uL (ref 4.22–5.81)
RDW: 14.6 % (ref 11.5–15.5)
WBC: 10 10*3/uL (ref 4.0–10.5)
nRBC: 0 % (ref 0.0–0.2)

## 2020-02-05 MED ORDER — KETOROLAC TROMETHAMINE 15 MG/ML IJ SOLN
15.0000 mg | Freq: Once | INTRAMUSCULAR | Status: AC
  Administered 2020-02-05: 15 mg via INTRAVENOUS
  Filled 2020-02-05: qty 1

## 2020-02-05 NOTE — Telephone Encounter (Signed)
Recommend stopping muscle relaxer If any ongoing troubles follow-up

## 2020-02-05 NOTE — Discharge Instructions (Addendum)
See urology for further work up to make sure not cancer or other cause.  Return for fevers, inability to urinate, uncontrolled pain or new concerns.

## 2020-02-05 NOTE — ED Provider Notes (Signed)
[facility] EMERGENCY DEPARTMENT Provider Note   CSN: [id number] Arrival date & time: 02/05/20  0555     History Chief Complaint  Patient presents with  . Hematuria    Nicolas [name] is a 44 y.o. male.  Patient presents with hematuria since yesterday the entire stream no clots.  Patient has had kidney stones in the past and passed them without needing procedures.  Patient has been seen primary care doctor intermittently for 2 months as musculoskeletal as there is a component worse with movement.  Patient denies weight loss.  No anterior abdominal pain but bilateral intermittent flank pain.  No vomiting, shortness of breath, fevers or chills.  No blood thinners.  No injuries.  Patient has history of high blood pressure.        Past Medical History:  Diagnosis Date  . History of stomach ulcers   . Hypertension   . Kidney stone    bil , different times  passed stones  . Migraines   . Pancreatitis     Patient Active Problem List   Diagnosis Date Noted  . Protein-calorie malnutrition, severe (HCC) 02/11/2014  . Peptic ulcer disease 02/10/2014  . Gastric ulcer 02/09/2014  . Nausea & vomiting 02/09/2014  . Nausea with vomiting 02/08/2014  . Tobacco use disorder 02/08/2014  . Obesity 02/01/2014  . Alcohol use 01/31/2014  . Acute pancreatitis 01/30/2014  . Musculoskeletal chest pain 01/19/2014  . Essential hypertension 10/24/2012    Past Surgical History:  Procedure Laterality Date  . BIOPSY N/A 02/11/2014   Procedure: BIOPSY;  Surgeon: Corbin Ade, MD;  Location: [facility];  Service: Endoscopy;  Laterality: N/A;  . CARDIAC CATHETERIZATION  Aug 2015   normal EF, normal coronaries  . CARDIAC CATHETERIZATION    . ESOPHAGOGASTRODUODENOSCOPY (EGD) WITH PROPOFOL N/A 02/11/2014   Procedure: ESOPHAGOGASTRODUODENOSCOPY (EGD) WITH PROPOFOL;  Surgeon: Corbin Ade, MD;  Location: [facility];  Service: Endoscopy;  Laterality: N/A;  . LEFT HEART CATHETERIZATION WITH CORONARY ANGIOGRAM  N/A 01/18/2014   Procedure: LEFT HEART CATHETERIZATION WITH CORONARY ANGIOGRAM;  Surgeon: Lennette Bihari, MD;  Location: [facility] CATH LAB;  Service: Cardiovascular;  Laterality: N/A;       Family History  Problem Relation Age of Onset  . Cancer Mother        Breast  . Colon cancer Neg Hx     Social History   Tobacco Use  . Smoking status: Current Every Day Smoker    Packs/day: 2.00    Years: 10.00    Pack years: 20.00    Types: Cigarettes  . Smokeless tobacco: Never Used  Vaping Use  . Vaping Use: Never used  Substance Use Topics  . Alcohol use: No  . Drug use: No    Home Medications Prior to Admission medications   Medication Sig Start Date End Date Taking? Authorizing Provider  amLODipine (NORVASC) 5 MG tablet Take 1 tablet (5 mg total) by mouth daily. 01/19/20   [name], MD  cyclobenzaprine (FLEXERIL) 10 MG tablet Take 1 tablet (10 mg total) by mouth 2 (two) times daily as needed for muscle spasms. Patient not taking: Reported on 11/19/2019 10/21/19   Mare Ferrari, PA-C  diclofenac (VOLTAREN) 75 MG EC tablet Take 1 tablet (75 mg total) by mouth 2 (two) times daily. 01/19/20   [name], MD  olopatadine (PATANOL) 0.1 % ophthalmic solution Place 1 drop into both eyes 2 (two) times daily. As needed for allergy 01/19/20   Lilyan Punt  A, MD  sulfacetamide (BLEPH-10) 10 % ophthalmic solution 2 drops in eye qid for 5 days when yellow crusting 01/19/20   [name], MD    Allergies    Patient has no known allergies.  Review of Systems   Review of Systems  Constitutional: Negative for chills and fever.  HENT: Negative for congestion.   Eyes: Negative for visual disturbance.  Respiratory: Negative for shortness of breath.   Cardiovascular: Negative for chest pain.  Gastrointestinal: Negative for abdominal pain and vomiting.  Genitourinary: Positive for flank pain and hematuria. Negative for dysuria.  Musculoskeletal: Negative for back pain, neck pain and neck  stiffness.  Skin: Negative for rash.  Neurological: Negative for light-headedness and headaches.    Physical Exam Updated Vital Signs BP (!) 141/73   Pulse 79   Temp 98.4 F (36.9 C)   Resp 18   Ht 6\' 3"  (1.905 m)   Wt 122.5 kg   SpO2 98%   BMI 33.75 kg/m   Physical Exam Vitals and nursing note reviewed.  Constitutional:      Appearance: He is well-developed.  HENT:     Head: Normocephalic and atraumatic.  Eyes:     General:        Right eye: No discharge.        Left eye: No discharge.     Conjunctiva/sclera: Conjunctivae normal.  Neck:     Trachea: No tracheal deviation.  Cardiovascular:     Rate and Rhythm: Normal rate and regular rhythm.  Pulmonary:     Effort: Pulmonary effort is normal.     Breath sounds: Normal breath sounds.  Abdominal:     General: There is no distension.     Palpations: Abdomen is soft.     Tenderness: There is no abdominal tenderness. There is no guarding.  Musculoskeletal:        General: Tenderness (mild bilateral flank and paraspinal) present.     Cervical back: Normal range of motion and neck supple.  Skin:    General: Skin is warm.     Findings: No rash.  Neurological:     Mental Status: He is alert and oriented to person, place, and time.  Psychiatric:        Mood and Affect: Mood normal.     ED Results / Procedures / Treatments   Labs (all labs ordered are listed, but only abnormal results are displayed) Labs Reviewed  URINALYSIS, ROUTINE W REFLEX MICROSCOPIC - Abnormal; Notable for the following components:      Result Value   Color, Urine AMBER (*)    APPearance CLOUDY (*)    Hgb urine dipstick LARGE (*)    Bilirubin Urine SMALL (*)    Protein, ur 100 (*)    RBC / HPF >50 (*)    All other components within normal limits  URINE CULTURE  CBC WITH DIFFERENTIAL/PLATELET  BASIC METABOLIC PANEL    EKG None  Radiology No results found.  Procedures Ultrasound ED Renal  Date/Time: 02/05/2020 8:07 AM Performed  by: 02/07/2020, MD Authorized by: Blane Ohara, MD   Procedure details:    Indications: hydronephrosis     Technique:  L kidney and R kidneyImages: archivedStudy Limitations: body habitus Left kidney findings:    Renal stones: not identified     Intra-abdominal fluid: not identified     Perinephric fluid: not identified     Hydronephrosis: none   Right kidney findings:    Nephrolithiasis: not identified  Renal stones: not identified     Perinephric fluid: not identified     Hydronephrosis: none     (including critical care time)  Medications Ordered in ED Medications  ketorolac (TORADOL) 15 MG/ML injection 15 mg (has no administration in time range)    ED Course  I have reviewed the triage vital signs and the nursing notes.  Pertinent labs & imaging results that were available during my care of the patient were reviewed by me and considered in my medical decision making (see chart for details).    MDM Rules/Calculators/A&P                          Patient presents with intermittent flank pain and now hematuria with history of kidney stones.  Discussed possible of kidney stones versus other kidney injury versus renal cancer/bladder cancer versus other.  Patient cigarette smoker.  Bedside ultrasound no significant hydronephrosis.  Plan for blood work and CT scan for further delineation since symptoms have been going on for now 2 months.  CT no acute abnormalities.  Blood work reviewed and reassuring normal kidney function, normal hemoglobin, normal white blood cell count.  Urine culture sent.  Patient stable to follow-up with urology to look for other causes which include malignancy especially with his smoking history.  Patient may have passed a kidney stone causing this as well.  Patient has no infectious signs.  Urinalysis reviewed showing significant hemoglobin.  Final Clinical Impression(s) / ED Diagnoses Final diagnoses:  Flank pain  Hematuria of unknown cause     Rx / DC Orders ED Discharge Orders    None       Blane Ohara, MD 02/05/20 [phone]

## 2020-02-05 NOTE — Telephone Encounter (Signed)
Patient notified and verbalized understanding. 

## 2020-02-05 NOTE — ED Triage Notes (Signed)
Pt c/o hematuria since yesterday. States he believes he has been having kidney stones for 2 months and his PCP just gives him muscle relaxants .

## 2020-02-05 NOTE — Telephone Encounter (Signed)
Patient went to ER this morning and possibly past a kidney stone. He states was having so much back pain. But feeling much better and wanting to know if he should continue taking muscle relaxer. Please advise

## 2020-02-06 LAB — URINE CULTURE: Culture: NO GROWTH

## 2020-04-14 ENCOUNTER — Ambulatory Visit: Payer: Self-pay | Admitting: Family Medicine

## 2020-04-22 ENCOUNTER — Encounter: Payer: Self-pay | Admitting: Family Medicine

## 2020-05-03 ENCOUNTER — Telehealth: Payer: Self-pay

## 2020-05-03 NOTE — Telephone Encounter (Signed)
Error

## 2020-05-04 ENCOUNTER — Ambulatory Visit (INDEPENDENT_AMBULATORY_CARE_PROVIDER_SITE_OTHER): Payer: Self-pay | Admitting: Family Medicine

## 2020-05-04 ENCOUNTER — Encounter: Payer: Self-pay | Admitting: Family Medicine

## 2020-05-04 ENCOUNTER — Other Ambulatory Visit: Payer: Self-pay

## 2020-05-04 VITALS — BP 140/92 | HR 123 | Temp 98.6°F | Wt 280.4 lb

## 2020-05-04 DIAGNOSIS — R109 Unspecified abdominal pain: Secondary | ICD-10-CM

## 2020-05-04 DIAGNOSIS — N3000 Acute cystitis without hematuria: Secondary | ICD-10-CM

## 2020-05-04 LAB — POCT URINALYSIS DIPSTICK
Spec Grav, UA: 1.025 (ref 1.010–1.025)
Urobilinogen, UA: 0.2 E.U./dL
pH, UA: 6 (ref 5.0–8.0)

## 2020-05-04 MED ORDER — NITROFURANTOIN MONOHYD MACRO 100 MG PO CAPS: 100.0000 mg | ORAL_CAPSULE | Freq: Two times a day (BID) | ORAL | 0 refills | Status: DC

## 2020-05-04 MED ORDER — HYDROCODONE-ACETAMINOPHEN 7.5-300 MG PO TABS: 1.0000 | ORAL_TABLET | ORAL | 0 refills | Status: DC | PRN

## 2020-05-04 NOTE — Patient Instructions (Signed)
Urinary Tract Infection, Adult A urinary tract infection (UTI) is an infection of any part of the urinary tract. The urinary tract includes:  The kidneys.  The ureters.  The bladder.  The urethra. These organs make, store, and get rid of pee (urine) in the body. What are the causes? This is caused by germs (bacteria) in your genital area. These germs grow and cause swelling (inflammation) of your urinary tract. What increases the risk? You are more likely to develop this condition if:  You have a small, thin tube (catheter) to drain pee.  You cannot control when you pee or poop (incontinence).  You are male, and: ? You use these methods to prevent pregnancy:  A medicine that kills sperm (spermicide).  A device that blocks sperm (diaphragm). ? You have low levels of a male hormone (estrogen). ? You are pregnant.  You have genes that add to your risk.  You are sexually active.  You take antibiotic medicines.  You have trouble peeing because of: ? A prostate that is bigger than normal, if you are male. ? A blockage in the part of your body that drains pee from the bladder (urethra). ? A kidney stone. ? A nerve condition that affects your bladder (neurogenic bladder). ? Not getting enough to drink. ? Not peeing often enough.  You have other conditions, such as: ? Diabetes. ? A weak disease-fighting system (immune system). ? Sickle cell disease. ? Gout. ? Injury of the spine. What are the signs or symptoms? Symptoms of this condition include:  Needing to pee right away (urgently).  Peeing often.  Peeing small amounts often.  Pain or burning when peeing.  Blood in the pee.  Pee that smells bad or not like normal.  Trouble peeing.  Pee that is cloudy.  Fluid coming from the vagina, if you are male.  Pain in the belly or lower back. Other symptoms include:  Throwing up (vomiting).  No urge to eat.  Feeling mixed up (confused).  Being tired  and grouchy (irritable).  A fever.  Watery poop (diarrhea). How is this treated? This condition may be treated with:  Antibiotic medicine.  Other medicines.  Drinking enough water. Follow these instructions at home:  Medicines  Take over-the-counter and prescription medicines only as told by your doctor.  If you were prescribed an antibiotic medicine, take it as told by your doctor. Do not stop taking it even if you start to feel better. General instructions  Make sure you: ? Pee until your bladder is empty. ? Do not hold pee for a long time. ? Empty your bladder after sex. ? Wipe from front to back after pooping if you are a male. Use each tissue one time when you wipe.  Drink enough fluid to keep your pee pale yellow.  Keep all follow-up visits as told by your doctor. This is important. Contact a doctor if:  You do not get better after 1-2 days.  Your symptoms go away and then come back. Get help right away if:  You have very bad back pain.  You have very bad pain in your lower belly.  You have a fever.  You are sick to your stomach (nauseous).  You are throwing up. Summary  A urinary tract infection (UTI) is an infection of any part of the urinary tract.  This condition is caused by germs in your genital area.  There are many risk factors for a UTI. These include having a small, thin   tube to drain pee and not being able to control when you pee or poop.  Treatment includes antibiotic medicines for germs.  Drink enough fluid to keep your pee pale yellow. This information is not intended to replace advice given to you by your health care provider. Make sure you discuss any questions you have with your health care provider. Document Revised: 05/16/2018 Document Reviewed: 12/06/2017 Elsevier Patient Education  2020 [employer] Acute Back Pain, Adult Acute back pain is sudden and usually short-lived. It is often caused by an injury to the muscles and  tissues in the back. The injury may result from:  A muscle or ligament getting overstretched or torn (strained). Ligaments are tissues that connect bones to each other. Lifting something improperly can cause a back strain.  Wear and tear (degeneration) of the spinal disks. Spinal disks are circular tissue that provides cushioning between the bones of the spine (vertebrae).  Twisting motions, such as while playing sports or doing yard work.  A hit to the back.  Arthritis. You may have a physical exam, lab tests, and imaging tests to find the cause of your pain. Acute back pain usually goes away with rest and home care. Follow these instructions at home: Managing pain, stiffness, and swelling  Take over-the-counter and prescription medicines only as told by your health care provider.  Your health care provider may recommend applying ice during the first 24-48 hours after your pain starts. To do this: ? Put ice in a plastic bag. ? Place a towel between your skin and the bag. ? Leave the ice on for 20 minutes, 2-3 times a day.  If directed, apply heat to the affected area as often as told by your health care provider. Use the heat source that your health care provider recommends, such as a moist heat pack or a heating pad. ? Place a towel between your skin and the heat source. ? Leave the heat on for 20-30 minutes. ? Remove the heat if your skin turns bright red. This is especially important if you are unable to feel pain, heat, or cold. You have a greater risk of getting burned. Activity   Do not stay in bed. Staying in bed for more than 1-2 days can delay your recovery.  Sit up and stand up straight. Avoid leaning forward when you sit, or hunching over when you stand. ? If you work at a desk, sit close to it so you do not need to lean over. Keep your chin tucked in. Keep your neck drawn back, and keep your elbows bent at a right angle. Your arms should look like the letter "L." ? Sit  high and close to the steering wheel when you drive. Add lower back (lumbar) support to your car seat, if needed.  Take short walks on even surfaces as soon as you are able. Try to increase the length of time you walk each day.  Do not sit, drive, or stand in one place for more than 30 minutes at a time. Sitting or standing for long periods of time can put stress on your back.  Do not drive or use heavy machinery while taking prescription pain medicine.  Use proper lifting techniques. When you bend and lift, use positions that put less stress on your back: ? [place] your knees. ? Keep the load close to your body. ? Avoid twisting.  Exercise regularly as told by your health care provider. Exercising helps your back heal faster and helps  prevent back injuries by keeping muscles strong and flexible.  Work with a physical therapist to make a safe exercise program, as recommended by your health care provider. Do any exercises as told by your physical therapist. Lifestyle  Maintain a healthy weight. Extra weight puts stress on your back and makes it difficult to have good posture.  Avoid activities or situations that make you feel anxious or stressed. Stress and anxiety increase muscle tension and can make back pain worse. Learn ways to manage anxiety and stress, such as through exercise. General instructions  Sleep on a firm mattress in a comfortable position. Try lying on your side with your knees slightly bent. If you lie on your back, put a pillow under your knees.  Follow your treatment plan as told by your health care provider. This may include: ? Cognitive or behavioral therapy. ? Acupuncture or massage therapy. ? Meditation or yoga. Contact a health care provider if:  You have pain that is not relieved with rest or medicine.  You have increasing pain going down into your legs or buttocks.  Your pain does not improve after 2 weeks.  You have pain at night.  You lose weight  without trying.  You have a fever or chills. Get help right away if:  You develop new bowel or bladder control problems.  You have unusual weakness or numbness in your arms or legs.  You develop nausea or vomiting.  You develop abdominal pain.  You feel faint. Summary  Acute back pain is sudden and usually short-lived.  Use proper lifting techniques. When you bend and lift, use positions that put less stress on your back.  Take over-the-counter and prescription medicines and apply heat or ice as directed by your health care provider. This information is not intended to replace advice given to you by your health care provider. Make sure you discuss any questions you have with your health care provider. Document Revised: 09/17/2018 Document Reviewed: 01/10/2017 Elsevier Patient Education  2020 [employer].

## 2020-05-04 NOTE — Progress Notes (Signed)
Pt having left side and back pain. Pt has been here several times for pain; also been to ER several times. MRI done at ER.  Pt went to ER a while ago due to urinating blood.     Patient ID: Nicolas [name], male    DOB: 04-27-1976, 44 y.o.   MRN: [id number]   Chief Complaint  Patient presents with  . Flank Pain   Subjective:    Presents today for the complaint of left side pain. He is comfortable with standing, hard to get out of the bed, pain with twisting and any movement. He was last seen at this in this office on August 9 for right side back pain, went to the emergency department on August 26 for flank pain also seen on June 9 for acute right-sided pain in May 6 for acute right-sided pain. He believes that on August 26 he had a kidney stone and he passed it. At that time he was experiencing blood in his urine.    Medical History Phat has a past medical history of History of stomach ulcers, Hypertension, Kidney stone, Migraines, and Pancreatitis.   Outpatient Encounter Medications as of 05/04/2020  Medication Sig  . amLODipine (NORVASC) 5 MG tablet Take 1 tablet (5 mg total) by mouth daily.  Marland Kitchen HYDROcodone-Acetaminophen 7.5-300 MG TABS Take 1 tablet by mouth every 4 (four) hours as needed.  . nitrofurantoin, macrocrystal-monohydrate, (MACROBID) 100 MG capsule Take 1 capsule (100 mg total) by mouth 2 (two) times daily.  . [DISCONTINUED] cyclobenzaprine (FLEXERIL) 10 MG tablet Take 1 tablet (10 mg total) by mouth 2 (two) times daily as needed for muscle spasms. (Patient not taking: Reported on 11/19/2019)  . [DISCONTINUED] diclofenac (VOLTAREN) 75 MG EC tablet Take 1 tablet (75 mg total) by mouth 2 (two) times daily.  . [DISCONTINUED] olopatadine (PATANOL) 0.1 % ophthalmic solution Place 1 drop into both eyes 2 (two) times daily. As needed for allergy  . [DISCONTINUED] sulfacetamide (BLEPH-10) 10 % ophthalmic solution 2 drops in eye qid for 5 days when yellow crusting   No  facility-administered encounter medications on file as of 05/04/2020.     Review of Systems  Constitutional: Negative for chills and fever.  Respiratory: Negative for shortness of breath.   Cardiovascular: Negative for chest pain.  Gastrointestinal: Negative for abdominal pain.  Genitourinary: Positive for flank pain. Negative for hematuria.  Musculoskeletal: Positive for back pain.  Neurological: Negative for weakness and numbness.     Vitals BP (!) 140/92   Pulse (!) 123   Temp 98.6 F (37 C)   Wt 280 lb 6.4 oz (127.2 kg)   SpO2 99%   BMI 35.05 kg/m   Objective:   Physical Exam Vitals and nursing note reviewed.  Constitutional:      General: He is not in acute distress.    Appearance: Normal appearance.     Comments: Appears uncomfortable with movement. In significant pain.  Cardiovascular:     Rate and Rhythm: Tachycardia present.     Heart sounds: Normal heart sounds.  Pulmonary:     Effort: Pulmonary effort is normal.     Breath sounds: Normal breath sounds.  Musculoskeletal:     Right lower leg: No edema.     Left lower leg: No edema.  Skin:    General: Skin is warm and dry.  Neurological:     General: No focal deficit present.     Mental Status: He is alert and oriented to person, place, and time.  GCS: GCS eye subscore is 4. GCS verbal subscore is 5. GCS motor subscore is 6.     Motor: No weakness or atrophy.     Gait: Gait normal.     Comments: 5/5 bilateral lower extremity strength.    Results for orders placed or performed in visit on 05/04/20  POCT Urinalysis Dipstick  Result Value Ref Range   Color, UA     Clarity, UA     Glucose, UA     Bilirubin, UA     Ketones, UA     Spec Grav, UA 1.025 1.010 - 1.025   Blood, UA     pH, UA 6.0 5.0 - 8.0   Protein, UA     Urobilinogen, UA 0.2 0.2 or 1.0 E.U./dL   Nitrite, UA     Leukocytes, UA Trace (A) Negative   Appearance     Odor       Assessment and Plan   1. Flank pain - POCT  Urinalysis Dipstick - HYDROcodone-Acetaminophen 7.5-300 MG TABS; Take 1 tablet by mouth every 4 (four) hours as needed.  Dispense: 20 tablet; Refill: 0  2. Acute cystitis without hematuria - nitrofurantoin, macrocrystal-monohydrate, (MACROBID) 100 MG capsule; Take 1 capsule (100 mg total) by mouth 2 (two) times daily.  Dispense: 14 capsule; Refill: 0   Consulted with Dr. Lilyan Punt while patient in the office, as Dr. Gerda Diss has seen this patient with similar complaints recently. Negative straight leg raise and 5/5 bilateral extremity strength. Repeat urine done due to August 26 possible kidney stone noted to have trace leukocytes, with left flank pain will treat for UTI.  Dr. Lilyan Punt suggested hydrocodone to be used sparingly for pain. Warnings discussed, not to use while operating a vehicle or heavy machinery. Risk for addiction.  Agrees with plan of care discussed today. Understands warning signs to seek further care: Numbness, tingling, weakness, any significant change. Understands to follow-up in 1 month with Dr. Lilyan Punt.    Novella Olive, NP 05/05/2020

## 2020-05-05 ENCOUNTER — Encounter: Payer: Self-pay | Admitting: Family Medicine

## 2020-05-10 ENCOUNTER — Telehealth: Payer: Self-pay

## 2020-05-10 ENCOUNTER — Other Ambulatory Visit: Payer: Self-pay | Admitting: Family Medicine

## 2020-05-10 MED ORDER — CEPHALEXIN 500 MG PO CAPS
ORAL_CAPSULE | ORAL | 0 refills | Status: DC
Start: 2020-05-10 — End: 2020-05-20

## 2020-05-10 MED ORDER — DICLOFENAC SODIUM 75 MG PO TBEC
DELAYED_RELEASE_TABLET | ORAL | 0 refills | Status: DC
Start: 2020-05-10 — End: 2020-05-20

## 2020-05-10 MED ORDER — OXYCODONE-ACETAMINOPHEN 5-325 MG PO TABS: 1.0000 | ORAL_TABLET | Freq: Four times a day (QID) | ORAL | 0 refills | Status: DC | PRN

## 2020-05-10 MED ORDER — OXYCODONE-ACETAMINOPHEN 5-325 MG PO TABS: 1.0000 | ORAL_TABLET | Freq: Four times a day (QID) | ORAL | 0 refills | Status: AC | PRN

## 2020-05-10 NOTE — Telephone Encounter (Signed)
I consulted with Dr. Lorin Picket during this patient visit. I would feel better if Dr. Lorin Picket chimes in on what next steps should be. I prescribed the opioids per Dr. Roby Lofts recommendation ( I do not feel comfortable prescribing more).  Thanks, [name]

## 2020-05-10 NOTE — Telephone Encounter (Signed)
Pt came in last week and was seen by [name] for pain in his side was prescribed nitrofurantoin, macrocrystal-monohydrate, (MACROBID) 100 MG capsule [[id number]] and HYDROcodone-Acetaminophen 7.5-300 MG TABS was not improving wants advice what he needs to do.  Pt call back [phone]

## 2020-05-10 NOTE — Telephone Encounter (Signed)
Percocet was sent into the pharmacy thank you

## 2020-05-10 NOTE — Telephone Encounter (Signed)
This seems more musculoskeletal at this point We will do an additional antibiotic to cover for any possibility of UTI as well  I recommend Voltaren 75 mg 1 twice daily, #20 Keflex 500 mg 1 3 times daily for 7 days Gentle stretching exercises Move up follow-up visit to be next week with me  Let me know if further concerns or issues (Does patient feel he needs an additional prescription on the narcotic?)

## 2020-05-10 NOTE — Telephone Encounter (Signed)
Pt contacted and verbalized understanding. Medications sent to pharmacy. Pt transferred up front to move follow up to next week. Pt states the pain pills are not helping him and would like something different for pain if possible. Please advise. Thank you

## 2020-05-10 NOTE — Telephone Encounter (Signed)
So pain medications are for temporary use.  I will send in a stronger pain medicine but it should be noted by the patient that this is not intended for long-term use and only for the next few days after that he will have to make do with the anti-inflammatory

## 2020-05-10 NOTE — Telephone Encounter (Signed)
Pt contacted and verbalized understanding. Pt states that he prefers to not take any pain med but is needing something to help him. Pt understood that pain med is for temporary use. Please advise. Thank you  [place]

## 2020-05-10 NOTE — Telephone Encounter (Signed)
Pain has become a little better after taking one of the pain pills. Pt has one more antibiotic for today. Pt states that when he put his boots on he felt something popped; pain still in Left side. Please advise. Thank you.   [place].

## 2020-05-20 ENCOUNTER — Encounter: Payer: Self-pay | Admitting: Family Medicine

## 2020-05-20 ENCOUNTER — Other Ambulatory Visit: Payer: Self-pay

## 2020-05-20 ENCOUNTER — Ambulatory Visit (INDEPENDENT_AMBULATORY_CARE_PROVIDER_SITE_OTHER): Payer: Self-pay | Admitting: Family Medicine

## 2020-05-20 VITALS — BP 132/86 | HR 99 | Temp 97.2°F | Wt 280.0 lb

## 2020-05-20 DIAGNOSIS — I10 Essential (primary) hypertension: Secondary | ICD-10-CM

## 2020-05-20 DIAGNOSIS — M549 Dorsalgia, unspecified: Secondary | ICD-10-CM

## 2020-05-20 LAB — POCT URINALYSIS DIPSTICK
Spec Grav, UA: >=1.03 — AB (ref 1.010–1.025)
Urobilinogen, UA: 0.2 E.U./dL
pH, UA: 6 (ref 5.0–8.0)

## 2020-05-20 MED ORDER — TRIAMCINOLONE ACETONIDE 0.1 % EX CREA: 1.0000 "application " | TOPICAL_CREAM | Freq: Two times a day (BID) | CUTANEOUS | 1 refills | Status: DC

## 2020-05-20 MED ORDER — PREDNISONE 20 MG PO TABS: ORAL_TABLET | ORAL | 0 refills | Status: DC

## 2020-05-20 MED ORDER — OXYCODONE-ACETAMINOPHEN 10-325 MG PO TABS: 1.0000 | ORAL_TABLET | ORAL | 0 refills | Status: AC | PRN

## 2020-05-20 NOTE — Progress Notes (Signed)
   Subjective:    Patient ID: Nicolas [name], male    DOB: [birth year], 44 y.o.   MRN: [id number]  HPI Pt here for left side/back pain. Pt states he has had one good day. Had to have help getting out of bed this morning. Standing up makes pain worse.  He is standing he does not have any trouble when he is sitting still no trouble but when he goes to stand up or goes to bend over he has severe pain on the left flank sign he also describes a burning pain and discomfort in his lower back denies abdominal pain no nausea vomiting diarrhea no bloody stools no hematuria  Review of Systems  Constitutional: Negative for diaphoresis and fatigue.  HENT: Negative for congestion and rhinorrhea.   Respiratory: Negative for cough and shortness of breath.   Cardiovascular: Negative for chest pain and leg swelling.  Gastrointestinal: Negative for abdominal pain and diarrhea.  Skin: Negative for color change and rash.  Neurological: Negative for dizziness and headaches.  Psychiatric/Behavioral: Negative for behavioral problems and confusion.       Objective:   Physical Exam Vitals reviewed.  Constitutional:      General: He is not in acute distress.    Appearance: He is well-nourished.  HENT:     Head: Normocephalic and atraumatic.  Eyes:     General:        Right eye: No discharge.        Left eye: No discharge.  Neck:     Trachea: No tracheal deviation.  Cardiovascular:     Rate and Rhythm: Normal rate and regular rhythm.     Heart sounds: Normal heart sounds. No murmur heard.   Pulmonary:     Effort: Pulmonary effort is normal. No respiratory distress.     Breath sounds: Normal breath sounds.  Musculoskeletal:        General: No edema.  Lymphadenopathy:     Cervical: No cervical adenopathy.  Skin:    General: Skin is warm and dry.  Neurological:     Mental Status: He is alert.     Coordination: Coordination normal.  Psychiatric:        Mood and Affect: Mood and affect normal.         Behavior: Behavior normal.     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 Also triamcinolone cream apply twice daily as needed, if ongoing troubles or problems follow-up  Left lower back pain more than likely musculoskeletal. It does not respond to the next round of treatment the next step would be consideration for scanning/MRI but will discuss possibility of doing plain x-rays

## 2020-05-21 ENCOUNTER — Telehealth: Payer: Self-pay | Admitting: *Deleted

## 2020-05-21 ENCOUNTER — Ambulatory Visit (HOSPITAL_COMMUNITY)
Admission: RE | Admit: 2020-05-21 | Discharge: 2020-05-21 | Disposition: A | Discharge status: Home / Self Care | Payer: Self-pay | Source: Ambulatory Visit | Attending: Family Medicine | Admitting: Family Medicine

## 2020-05-21 DIAGNOSIS — M549 Dorsalgia, unspecified: Secondary | ICD-10-CM | POA: Insufficient documentation

## 2020-05-21 NOTE — Telephone Encounter (Signed)
Discussed with pt. Pt verbalized understanding and message sent to dr scott to review on Monday.

## 2020-05-21 NOTE — Telephone Encounter (Signed)
The xrays have not been resulted yet.  Use heat/ice and pain medications as directed by pcp.   If worsening pain, incontinence or fever to go to ER.  Dr. Ladona Ridgel

## 2020-05-21 NOTE — Telephone Encounter (Signed)
Pt seen yesterday and had xrays done today. States has been having the pain for 4-5 months but today is the worse the pain has ever been. Pain is constant. Worse with movement and standing. Prescribed prednisone and oxycodone. Wanted to know if dr scott could look at [facility]. Told him he was out of office and xray still pending at time of the call. And I would send to dr taylor to see if she could review. Advised pt if pain is severe then he should go to ED.

## 2020-05-22 NOTE — Telephone Encounter (Signed)
The x-ray shows compression in the T11-T12 region which may or may not be causing his pain but needs further looking into I would recommend an MRI. I will be discussing this situation with radiologist to find out what would be the best test. Please go ahead and inform the patient then send me this message back I will be talking the radiologist on Monday and we will be able to move forward with getting his test schedule ASAP

## 2020-05-23 LAB — URINE CULTURE

## 2020-05-24 NOTE — Telephone Encounter (Signed)
Patient notified and verbalized understanding. Patient wanted you to know that his pain and decreased quite a bit -unsure if this is because of the steroids or pain meds but he is feeling better.

## 2020-05-25 ENCOUNTER — Telehealth: Payer: Self-pay

## 2020-05-25 DIAGNOSIS — M549 Dorsalgia, unspecified: Secondary | ICD-10-CM

## 2020-05-25 NOTE — Telephone Encounter (Signed)
Please advise. Thank you

## 2020-05-25 NOTE — Telephone Encounter (Signed)
   ME    1        Nicolas [name] [address]" Male, 44 y.o., [birth year]  MRN:  [id number] Phone:  [phone] Judie Petit)       PCP:  [name], MD Coverage:  Medicaid Potential/Medicaid Potential              To Close This Visit  Required Items  No additional encounter notes found.      Advice Only (Patient is wanting to know what treatment plan for him because he still in so much pain. Please advice)  You routed conversation to [facility] 46 minutes ago (2:36 PM)   [name], [name] [phone]  You 1 hour ago (1:47 PM)       Questionnaires  No completed forms available for this encounter.

## 2020-05-26 ENCOUNTER — Other Ambulatory Visit: Payer: Self-pay | Admitting: Family Medicine

## 2020-05-26 NOTE — Telephone Encounter (Signed)
I discussed his case with radiology-the radiologist who read the report Based upon the patient's symptoms and findings on exam I would recommend a lumbar MRI. Because the patient is having severe pain please move forward as quickly as possible setting this up

## 2020-05-26 NOTE — Telephone Encounter (Signed)
Patient states his pain is 50 percent better today and he is feeling a lot better than yesterday. Patient states he will run out of steroids and pain medication tomorrow and wants to know what he needs to do in that respect- will needs refills if he is to continue with meds.

## 2020-05-26 NOTE — Telephone Encounter (Signed)
May do prednisone 20 mg 1/day for an additional 7 days  May do oxycodone 10 mg / 325 mg 1 every 6 hours as needed severe pain #18 caution drowsiness as patient improves he needs to back off on this medication Please pend that I will sign MRI should be in the process of getting set up

## 2020-05-27 MED ORDER — OXYCODONE-ACETAMINOPHEN 10-325 MG PO TABS: ORAL_TABLET | ORAL | 0 refills | Status: DC

## 2020-05-27 MED ORDER — PREDNISONE 20 MG PO TABS
ORAL_TABLET | ORAL | 0 refills | Status: DC
Start: 2020-05-27 — End: 2020-10-21

## 2020-05-27 NOTE — Telephone Encounter (Signed)
Pt contacted and verbalized understanding.  

## 2020-05-27 NOTE — Telephone Encounter (Signed)
Meds pended and will call pt after its sent

## 2020-05-27 NOTE — Telephone Encounter (Signed)
Medication was sent in thank you 

## 2020-05-31 ENCOUNTER — Telehealth: Payer: Self-pay | Admitting: Family Medicine

## 2020-06-01 NOTE — Telephone Encounter (Signed)
error 

## 2020-06-02 ENCOUNTER — Ambulatory Visit: Payer: Self-pay | Admitting: Family Medicine

## 2020-06-07 ENCOUNTER — Telehealth: Payer: Self-pay

## 2020-06-07 ENCOUNTER — Emergency Department (HOSPITAL_COMMUNITY)
Admission: EM | Admit: 2020-06-07 | Discharge: 2020-06-07 | Disposition: A | Discharge status: Home / Self Care | Payer: PRIVATE HEALTH INSURANCE

## 2020-06-07 DIAGNOSIS — M549 Dorsalgia, unspecified: Secondary | ICD-10-CM

## 2020-06-07 NOTE — Telephone Encounter (Signed)
Pt called last week he is still having side pain on right side nothing is improving.   Pt call back [phone]

## 2020-06-07 NOTE — Telephone Encounter (Signed)
1.  Patient has appointment with radiology for MRI on 4 January 2.  Does patient feel he needs additional pain medicine? 3.  Please go ahead and initiate referral to Dr. Philis Nettle. Ramo's with [facility] for back pain more than likely they will not see him until the MRI is complete #4 does patient feel he needs to be seen again this week?  Or wait until MRI is done then go from there?

## 2020-06-07 NOTE — Telephone Encounter (Signed)
Pt states that his pain is going back and forth from right side to left side. Pt states he is unable to function properly. Pt states not sure if he needs to go to ED or what.  Pt states that he can't work, he cant do anything. Pt states on a scale of 1-10 his pain is a 10. Pt informed that Dr.Scott out of office until Wednesday. Informed pt to go to ED if having severe pain.

## 2020-06-08 ENCOUNTER — Telehealth: Payer: Self-pay | Admitting: Family Medicine

## 2020-06-08 ENCOUNTER — Other Ambulatory Visit: Payer: Self-pay | Admitting: Family Medicine

## 2020-06-08 MED ORDER — OXYCODONE-ACETAMINOPHEN 10-325 MG PO TABS: ORAL_TABLET | ORAL | 0 refills | Status: DC

## 2020-06-08 NOTE — Telephone Encounter (Signed)
A short prescription was sent to [facility]  Patient should keep his follow-up office visit, please get a urine specimen when he comes

## 2020-06-08 NOTE — Telephone Encounter (Signed)
Pt verbalized understanding.

## 2020-06-08 NOTE — Telephone Encounter (Signed)
Pt contacted. Pt states he doesn't really want to take pain meds but is OK with having some sent to [facility]. Referral placed to [facility]. Pt would like to be seen; appt scheduled for Thursday Dec 30 at 1:40 pm. Pt states his father seems to believe he has a kidney stone but pt states he is urinating fine.   [name]-Pt states he has just received new insurance called [employer] Friday. Has not received card at this time but has email with all information.

## 2020-06-10 ENCOUNTER — Ambulatory Visit: Payer: Self-pay | Admitting: Family Medicine

## 2020-06-15 ENCOUNTER — Ambulatory Visit (HOSPITAL_COMMUNITY): Payer: Self-pay

## 2020-07-12 ENCOUNTER — Telehealth: Payer: Self-pay | Admitting: Family Medicine

## 2020-07-12 MED ORDER — METHOCARBAMOL 500 MG PO TABS: ORAL_TABLET | ORAL | 0 refills | Status: DC

## 2020-07-12 NOTE — Telephone Encounter (Signed)
Pt contacted and verbalized understanding. Pt states he will have MRI done and have a follow up set up. Medication sent to pharmacy

## 2020-07-12 NOTE — Telephone Encounter (Signed)
Instead of pain medicine patient can try Robaxin 1 tablet 3 times daily as needed, #21  Truly we need the MRI in hopefully the MRI will help reveal what is causing all of this  It is for the altered taste I would recommend the patient monitor this or watch this if progressive symptoms ENT evaluation can be done unlikely that this is Covid although if it this just recently hit him he should consider doing a home Covid test, or testing at testing center

## 2020-07-12 NOTE — Telephone Encounter (Signed)
Pt states that pain in "like something is cutting him in two". MRI has been pushed back to 07/19/20 due to insurance. Pt was able to tolerate pain for 2 weeks without pain meds. Pt states pain med stops him up. Pt states if you put your hand flat on right side and move it slightly toward his back. Pt states today he can hardly stand up straight.  Pt also states that he has been noticing that meats taste and smell different. Taste ashy. Pt has not had COVID that he knows of. Please advise. Thank you

## 2020-07-12 NOTE — Telephone Encounter (Signed)
Patient called stating still having right-side pain and it worst today. He has appointment for MRI on 2/7. Please advise

## 2020-07-16 ENCOUNTER — Ambulatory Visit (HOSPITAL_COMMUNITY): Payer: PRIVATE HEALTH INSURANCE

## 2020-08-03 ENCOUNTER — Ambulatory Visit (HOSPITAL_COMMUNITY): Payer: PRIVATE HEALTH INSURANCE

## 2020-08-05 ENCOUNTER — Ambulatory Visit (HOSPITAL_COMMUNITY)
Admission: RE | Admit: 2020-08-05 | Discharge: 2020-08-05 | Disposition: A | Discharge status: Home / Self Care | Payer: 59 | Source: Ambulatory Visit | Attending: Family Medicine | Admitting: Family Medicine

## 2020-08-05 ENCOUNTER — Other Ambulatory Visit: Payer: Self-pay

## 2020-08-05 DIAGNOSIS — M549 Dorsalgia, unspecified: Secondary | ICD-10-CM | POA: Diagnosis present

## 2020-08-09 ENCOUNTER — Other Ambulatory Visit: Payer: Self-pay | Admitting: Family Medicine

## 2020-08-09 MED ORDER — OXYCODONE-ACETAMINOPHEN 10-325 MG PO TABS: ORAL_TABLET | ORAL | 0 refills | Status: DC

## 2020-08-09 NOTE — Addendum Note (Signed)
Addended by: Margaretha Sheffield on: 08/09/2020 03:20 PM   Modules accepted: Orders

## 2020-08-25 ENCOUNTER — Ambulatory Visit: Payer: Self-pay | Admitting: Family Medicine

## 2020-08-25 ENCOUNTER — Encounter: Payer: Self-pay | Admitting: Family Medicine

## 2020-09-18 ENCOUNTER — Other Ambulatory Visit: Payer: Self-pay

## 2020-09-18 ENCOUNTER — Encounter: Payer: Self-pay | Admitting: Emergency Medicine

## 2020-09-18 ENCOUNTER — Ambulatory Visit
Admission: EM | Admit: 2020-09-18 | Discharge: 2020-09-18 | Disposition: A | Discharge status: Home / Self Care | Payer: PRIVATE HEALTH INSURANCE | Attending: Emergency Medicine | Admitting: Emergency Medicine

## 2020-09-18 DIAGNOSIS — J039 Acute tonsillitis, unspecified: Secondary | ICD-10-CM

## 2020-09-18 MED ORDER — AMOXICILLIN-POT CLAVULANATE 875-125 MG PO TABS: 1.0000 | ORAL_TABLET | Freq: Two times a day (BID) | ORAL | 0 refills | Status: DC

## 2020-09-18 NOTE — Discharge Instructions (Signed)
Push fluids and get rest Prescribed Augmentin.  Take as directed and to completion.  Drink warm or cool liquids, use throat lozenges, or popsicles to help alleviate symptoms Take OTC ibuprofen or tylenol as needed for pain Follow up with PCP if symptoms persist Return or go to ER if you have any new or worsening symptoms such as fever, chills, nausea, vomiting, worsening sore throat, cough, abdominal pain, chest pain, changes in bowel or bladder habits, etc..Marland Kitchen

## 2020-09-18 NOTE — ED Triage Notes (Signed)
Sore throat and ear popping on LT side for past couple of days.  Reports coughing up some mucous prior to coming in that relieved the sore throat some.

## 2020-09-18 NOTE — ED Provider Notes (Signed)
[facility]   [id number] 09/18/20 Arrival Time: 1024  ZG:YFVC THROAT  SUBJECTIVE: History from: patient.  Nicolas [name] is a 45 y.o. male who presented to the urgent care with a complaint of sore throat for the past few days.  Denies exposure to strep, flu or mono, or precipitating event.  Has tried OTC medication without relief.  Symptoms are made worse with swallowing, but tolerating liquids and own secretions without difficulty.  Denies previous symptoms in the past.   Denies fever, chills, fatigue, ear pain, sinus pain, rhinorrhea, nasal congestion, cough, SOB, wheezing, chest pain, nausea, rash, changes in bowel or bladder habits.     ROS: As per HPI.  All other pertinent ROS negative.     Past Medical History:  Diagnosis Date  . History of stomach ulcers   . Hypertension   . Kidney stone    bil , different times  passed stones  . Migraines   . Pancreatitis    Past Surgical History:  Procedure Laterality Date  . BIOPSY N/A 02/11/2014   Procedure: BIOPSY;  Surgeon: Corbin Ade, MD;  Location: [facility];  Service: Endoscopy;  Laterality: N/A;  . CARDIAC CATHETERIZATION  Aug 2015   normal EF, normal coronaries  . CARDIAC CATHETERIZATION    . ESOPHAGOGASTRODUODENOSCOPY (EGD) WITH PROPOFOL N/A 02/11/2014   Procedure: ESOPHAGOGASTRODUODENOSCOPY (EGD) WITH PROPOFOL;  Surgeon: Corbin Ade, MD;  Location: [facility];  Service: Endoscopy;  Laterality: N/A;  . LEFT HEART CATHETERIZATION WITH CORONARY ANGIOGRAM N/A 01/18/2014   Procedure: LEFT HEART CATHETERIZATION WITH CORONARY ANGIOGRAM;  Surgeon: Lennette Bihari, MD;  Location: [facility] CATH [facility];  Service: Cardiovascular;  Laterality: N/A;   No Known Allergies No current facility-administered medications on file prior to encounter.   Current Outpatient Medications on File Prior to Encounter  Medication Sig Dispense Refill  . amLODipine (NORVASC) 5 MG tablet TAKE 1 TABLET BY MOUTH ONCE DAILY. 30 tablet 0  . methocarbamol (ROBAXIN)  500 MG tablet Take one tablet po TID prn 21 tablet 0  . oxyCODONE-acetaminophen (PERCOCET) 10-325 MG tablet Take one tablet every 6 hours prn pain. Caution drowiness 15 tablet 0  . predniSONE (DELTASONE) 20 MG tablet 3qd for 3d then 2qd for 3d then 1qd for 3d 18 tablet 0  . predniSONE (DELTASONE) 20 MG tablet Take one tablet daily for 7 days 7 tablet 0  . triamcinolone (KENALOG) 0.1 % Apply 1 application topically 2 (two) times daily. 60 g 1   Social History   Socioeconomic History  . Marital status: Married    Spouse name: Not on file  . Number of children: Not on file  . Years of education: Not on file  . Highest education level: Not on file  Occupational History  . Not on file  Tobacco Use  . Smoking status: Current Every Day Smoker    Packs/day: 2.00    Years: 10.00    Pack years: 20.00    Types: Cigarettes  . Smokeless tobacco: Never Used  Vaping Use  . Vaping Use: Never used  Substance and Sexual Activity  . Alcohol use: No  . Drug use: No  . Sexual activity: Yes    Birth control/protection: None  Other Topics Concern  . Not on file  Social History Narrative  . Not on file   Social Determinants of Health   Financial Resource Strain: Not on file  Food Insecurity: Not on file  Transportation Needs: Not on file  Physical Activity: Not on file  Stress: Not on file  Social Connections: Not on file  Intimate Partner Violence: Not on file   Family History  Problem Relation Age of Onset  . Cancer Mother        Breast  . Colon cancer Neg Hx     OBJECTIVE:  Vitals:   09/18/20 1051  BP: (!) 152/96  Pulse: (!) 102  Resp: 19  Temp: 99.7 F (37.6 C)  TempSrc: Oral  SpO2: 97%     General appearance: alert; appears fatigued, but nontoxic, speaking in full sentences and managing own secretions HEENT: NCAT; Ears: EACs clear, TMs pearly gray with visible cone of light, without erythema; Eyes: PERRL, EOMI grossly; Nose: no obvious rhinorrhea; Throat: oropharynx  clear, tonsils 2+ and mildly erythematous with white tonsillar exudates, uvula midline Neck: supple without LAD Lungs: CTA bilaterally without adventitious breath sounds; cough absent Heart: regular rate and rhythm.  Radial pulses 2+ symmetrical bilaterally Skin: warm and dry Psychological: alert and cooperative; normal mood and affect  LABS: No results found for this or any previous visit (from the past 24 hour(s)).   ASSESSMENT & PLAN:  1. Acute tonsillitis, unspecified etiology     Meds ordered this encounter  Medications  . amoxicillin-clavulanate (AUGMENTIN) 875-125 MG tablet    Sig: Take 1 tablet by mouth every 12 (twelve) hours.    Dispense:  14 tablet    Refill:  0   Patient is stable at discharge.  We are unable to complete a strep test as patient stated he will vomit if trying to swab his throat  Discharge instructions  Push fluids and get rest Prescribed Augmentin.  Take as directed and to completion.  Drink warm or cool liquids, use throat lozenges, or popsicles to help alleviate symptoms Take OTC ibuprofen or tylenol as needed for pain Follow up with PCP if symptoms persist Return or go to ER if you have any new or worsening symptoms such as fever, chills, nausea, vomiting, worsening sore throat, cough, abdominal pain, chest pain, changes in bowel or bladder habits, etc...   Reviewed expectations re: course of current medical issues. Questions answered. Outlined signs and symptoms indicating need for more acute intervention. Patient verbalized understanding. After Visit Summary given.        Durward Parcel, FNP 09/18/20 1119

## 2020-10-05 ENCOUNTER — Other Ambulatory Visit: Payer: Self-pay | Admitting: Family Medicine

## 2020-10-05 NOTE — Telephone Encounter (Signed)
I need more information Where is the patient using this particular appointment?  And how often are they using it?  What are they treating as and what is the diagnosis?

## 2020-10-08 NOTE — Telephone Encounter (Signed)
Pt contacted. 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 Pt states sometimes he uses it prn then again if he gets into the sunlight the psoriasis starts to flare up and he uses it every day for a few days. Please advise. Thank you

## 2020-10-19 ENCOUNTER — Other Ambulatory Visit: Payer: Self-pay | Admitting: Family Medicine

## 2020-10-21 ENCOUNTER — Other Ambulatory Visit: Payer: Self-pay

## 2020-10-21 ENCOUNTER — Ambulatory Visit (INDEPENDENT_AMBULATORY_CARE_PROVIDER_SITE_OTHER): Payer: 59 | Admitting: Family Medicine

## 2020-10-21 ENCOUNTER — Encounter: Payer: Self-pay | Admitting: Family Medicine

## 2020-10-21 VITALS — BP 134/84 | HR 95 | Temp 98.4°F | Wt 280.6 lb

## 2020-10-21 DIAGNOSIS — H1033 Unspecified acute conjunctivitis, bilateral: Secondary | ICD-10-CM | POA: Insufficient documentation

## 2020-10-21 DIAGNOSIS — H1013 Acute atopic conjunctivitis, bilateral: Secondary | ICD-10-CM | POA: Insufficient documentation

## 2020-10-21 MED ORDER — ERYTHROMYCIN 5 MG/GM OP OINT
1.0000 | TOPICAL_OINTMENT | Freq: Every day | OPHTHALMIC | 0 refills | Status: DC
Start: 2020-10-21 — End: 2021-11-16

## 2020-10-21 NOTE — Progress Notes (Addendum)
Patient ID: Nicolas [name], male    DOB: May 22, 1976, 45 y.o.   MRN: [id number]   Chief Complaint  Patient presents with  . Eye Pain   Subjective:  CC: eye redness and drainage  This is a new problem.  Presents today with complaint of both eyes red, crusty drainage worse in the morning, matted together upon wakening.  Symptoms have been present for 1 week.  Has a history of allergic conjunctivitis, states this is different.  Denies fever, chills, chest pain, shortness of breath.  See review of systems.  Blood pressure elevated in office, has not taken amlodipine this morning.  Denies vision changes.  Denies eye pain.   Pt having pain in both eyes, mainly in corner of eyes. Pt states this has been going on about one week. Pt states his eyes will become red, swollen and watery.    Medical History Nicolas [name] has a past medical history of History of stomach ulcers, Hypertension, Kidney stone, Migraines, and Pancreatitis.   Outpatient Encounter Medications as of 10/21/2020  Medication Sig  . amLODipine (NORVASC) 5 MG tablet TAKE 1 TABLET BY MOUTH ONCE DAILY.  Marland Kitchen erythromycin ophthalmic ointment Place 1 application into both eyes at bedtime.  . [DISCONTINUED] amoxicillin-clavulanate (AUGMENTIN) 875-125 MG tablet Take 1 tablet by mouth every 12 (twelve) hours.  . [DISCONTINUED] clobetasol ointment (TEMOVATE) 0.05 % APPLY THIN AMOUNT TWICE DAILY TO AFFECTED AREA AS NEEDED.(DO NOT APPLY TO FACIAL AREA)  . [DISCONTINUED] methocarbamol (ROBAXIN) 500 MG tablet Take one tablet po TID prn  . [DISCONTINUED] oxyCODONE-acetaminophen (PERCOCET) 10-325 MG tablet Take one tablet every 6 hours prn pain. Caution drowiness  . [DISCONTINUED] predniSONE (DELTASONE) 20 MG tablet 3qd for 3d then 2qd for 3d then 1qd for 3d  . [DISCONTINUED] predniSONE (DELTASONE) 20 MG tablet Take one tablet daily for 7 days  . [DISCONTINUED] triamcinolone (KENALOG) 0.1 % Apply 1 application topically 2 (two) times daily.   No  facility-administered encounter medications on file as of 10/21/2020.     Review of Systems  Constitutional: Negative for chills and fever.  Eyes: Positive for discharge, redness and itching. Negative for photophobia and visual disturbance.  Respiratory: Negative for shortness of breath.   Cardiovascular: Negative for chest pain.  Neurological: Negative for dizziness, light-headedness and headaches.     Vitals BP 134/84   Pulse 95   Temp 98.4 F (36.9 C)   Wt 280 lb 9.6 oz (127.3 kg)   SpO2 96%   BMI 35.07 kg/m   Objective:   Physical Exam Vitals and nursing note reviewed.  Eyes:     General: Vision grossly intact.     Conjunctiva/sclera:     Right eye: No exudate or hemorrhage.    Left eye: No exudate or hemorrhage.    Comments: Left conjunctiva with erythema greater than right. No discharge present at this visit. Reports matted upon awakening.    Cardiovascular:     Rate and Rhythm: Normal rate and regular rhythm.     Heart sounds: Normal heart sounds.  Pulmonary:     Effort: Pulmonary effort is normal.     Breath sounds: Normal breath sounds.  Skin:    General: Skin is warm and dry.  Neurological:     General: No focal deficit present.     Mental Status: He is alert.  Psychiatric:        Behavior: Behavior normal.      Assessment and Plan   1. Acute bacterial conjunctivitis of both eyes -  erythromycin ophthalmic ointment; Place 1 application into both eyes at bedtime.  Dispense: 3.5 g; Refill: 0   Blood pressure not optimal, will go home and take amlodipine as prescribed.  Bilateral acute bacterial conjunctivitis, will use erythromycin ointment in both eyes at bedtime.  Agrees with plan of care discussed today. Understands warning signs to seek further care: chest pain, shortness of breath, any significant change in health.  Understands to follow-up if symptoms do not improve, or worsen.  Smoking cessation discussed.  Recommended taking amlodipine as  soon as possible, and to avoid missing doses.  Risk of stroke discussed.   Novella Olive, NP 10/21/2020

## 2020-10-21 NOTE — Patient Instructions (Signed)

## 2020-11-12 ENCOUNTER — Other Ambulatory Visit: Payer: Self-pay

## 2020-11-12 ENCOUNTER — Telehealth: Payer: Self-pay

## 2020-11-12 DIAGNOSIS — Z0000 Encounter for general adult medical examination without abnormal findings: Secondary | ICD-10-CM

## 2020-11-12 NOTE — Telephone Encounter (Signed)
Active labs from 11/19/19 CMP, CBC, Lipid. Please advise. Thank you

## 2020-11-12 NOTE — Telephone Encounter (Signed)
Need Blood Work ordered before Phy on 07/08  Pt call back [phone]

## 2020-11-12 NOTE — Telephone Encounter (Signed)
CMP, lipid, HIV antibody, hep C antibody per CDC guidelines for a one-time only test

## 2020-11-18 ENCOUNTER — Emergency Department (HOSPITAL_COMMUNITY): Payer: 59

## 2020-11-18 ENCOUNTER — Other Ambulatory Visit: Payer: Self-pay

## 2020-11-18 ENCOUNTER — Encounter (HOSPITAL_COMMUNITY): Payer: Self-pay

## 2020-11-18 ENCOUNTER — Emergency Department (HOSPITAL_COMMUNITY)
Admission: EM | Admit: 2020-11-18 | Discharge: 2020-11-18 | Disposition: A | Discharge status: Home / Self Care | Payer: 59 | Attending: Emergency Medicine | Admitting: Emergency Medicine

## 2020-11-18 DIAGNOSIS — R252 Cramp and spasm: Secondary | ICD-10-CM | POA: Insufficient documentation

## 2020-11-18 DIAGNOSIS — R42 Dizziness and giddiness: Secondary | ICD-10-CM | POA: Insufficient documentation

## 2020-11-18 DIAGNOSIS — R55 Syncope and collapse: Secondary | ICD-10-CM | POA: Insufficient documentation

## 2020-11-18 DIAGNOSIS — R0602 Shortness of breath: Secondary | ICD-10-CM | POA: Diagnosis not present

## 2020-11-18 DIAGNOSIS — R531 Weakness: Secondary | ICD-10-CM | POA: Diagnosis not present

## 2020-11-18 DIAGNOSIS — R110 Nausea: Secondary | ICD-10-CM | POA: Diagnosis not present

## 2020-11-18 DIAGNOSIS — I10 Essential (primary) hypertension: Secondary | ICD-10-CM | POA: Diagnosis not present

## 2020-11-18 DIAGNOSIS — Z79899 Other long term (current) drug therapy: Secondary | ICD-10-CM | POA: Diagnosis not present

## 2020-11-18 DIAGNOSIS — Z955 Presence of coronary angioplasty implant and graft: Secondary | ICD-10-CM | POA: Diagnosis not present

## 2020-11-18 DIAGNOSIS — F17210 Nicotine dependence, cigarettes, uncomplicated: Secondary | ICD-10-CM | POA: Insufficient documentation

## 2020-11-18 LAB — COMPREHENSIVE METABOLIC PANEL
ALT: 31 U/L (ref 0–44)
AST: 21 U/L (ref 15–41)
Albumin: 4.1 g/dL (ref 3.5–5.0)
Alkaline Phosphatase: 95 U/L (ref 38–126)
Anion gap: 7 (ref 5–15)
BUN: 24 mg/dL — ABNORMAL HIGH (ref 6–20)
CO2: 26 mmol/L (ref 22–32)
Calcium: 9.1 mg/dL (ref 8.9–10.3)
Chloride: 106 mmol/L (ref 98–111)
Creatinine, Ser: 1.05 mg/dL (ref 0.61–1.24)
GFR, Estimated: >60 mL/min (ref >60)
Glucose, Bld: 135 mg/dL — ABNORMAL HIGH (ref 70–99)
Potassium: 3.5 mmol/L (ref 3.5–5.1)
Sodium: 139 mmol/L (ref 135–145)
Total Bilirubin: 0.8 mg/dL (ref 0.3–1.2)
Total Protein: 7 g/dL (ref 6.5–8.1)

## 2020-11-18 LAB — RAPID URINE DRUG SCREEN, HOSP PERFORMED
Amphetamines: NOT DETECTED
Barbiturates: NOT DETECTED
Benzodiazepines: NOT DETECTED
Cocaine: NOT DETECTED
Opiates: NOT DETECTED
Tetrahydrocannabinol: NOT DETECTED

## 2020-11-18 LAB — CBC WITH DIFFERENTIAL/PLATELET
Abs Immature Granulocytes: 0.08 10*3/uL — ABNORMAL HIGH (ref 0.00–0.07)
Basophils Absolute: 0 10*3/uL (ref 0.0–0.1)
Basophils Relative: 0 %
Eosinophils Absolute: 0.2 10*3/uL (ref 0.0–0.5)
Eosinophils Relative: 2 %
HCT: 46.9 % (ref 39.0–52.0)
Hemoglobin: 16.3 g/dL (ref 13.0–17.0)
Immature Granulocytes: 1 %
Lymphocytes Relative: 29 %
Lymphs Abs: 2.9 10*3/uL (ref 0.7–4.0)
MCH: 32.2 pg (ref 26.0–34.0)
MCHC: 34.8 g/dL (ref 30.0–36.0)
MCV: 92.7 fL (ref 80.0–100.0)
Monocytes Absolute: 0.5 10*3/uL (ref 0.1–1.0)
Monocytes Relative: 5 %
Neutro Abs: 6.3 10*3/uL (ref 1.7–7.7)
Neutrophils Relative %: 63 %
Platelets: 277 10*3/uL (ref 150–400)
RBC: 5.06 MIL/uL (ref 4.22–5.81)
RDW: 14.6 % (ref 11.5–15.5)
WBC: 10 10*3/uL (ref 4.0–10.5)
nRBC: 0 % (ref 0.0–0.2)

## 2020-11-18 LAB — TROPONIN I (HIGH SENSITIVITY)
Troponin I (High Sensitivity): 5 ng/L (ref <18)
Troponin I (High Sensitivity): 19 ng/L — ABNORMAL HIGH (ref <18)
Troponin I (High Sensitivity): 16 ng/L (ref <18)

## 2020-11-18 LAB — URINALYSIS, ROUTINE W REFLEX MICROSCOPIC
Bilirubin Urine: NEGATIVE
Glucose, UA: NEGATIVE mg/dL
Hgb urine dipstick: NEGATIVE
Ketones, ur: NEGATIVE mg/dL
Leukocytes,Ua: NEGATIVE
Nitrite: NEGATIVE
Protein, ur: 100 mg/dL — AB
Specific Gravity, Urine: 1.02 (ref 1.005–1.030)
pH: 6 (ref 5.0–8.0)

## 2020-11-18 LAB — LIPASE, BLOOD: Lipase: 35 U/L (ref 11–51)

## 2020-11-18 LAB — MAGNESIUM: Magnesium: 1.9 mg/dL (ref 1.7–2.4)

## 2020-11-18 LAB — ETHANOL: Alcohol, Ethyl (B): <10 mg/dL (ref <10)

## 2020-11-18 MED ORDER — SODIUM CHLORIDE 0.9 % IV BOLUS
1000.0000 mL | Freq: Once | INTRAVENOUS | Status: AC
  Administered 2020-11-18: 1000 mL via INTRAVENOUS

## 2020-11-18 MED ORDER — ONDANSETRON HCL 4 MG/2ML IJ SOLN
4.0000 mg | Freq: Once | INTRAMUSCULAR | Status: AC
  Administered 2020-11-18: 4 mg via INTRAVENOUS
  Filled 2020-11-18: qty 2

## 2020-11-18 MED ORDER — ACETAMINOPHEN 325 MG PO TABS
650.0000 mg | ORAL_TABLET | Freq: Once | ORAL | Status: AC
  Administered 2020-11-18: 650 mg via ORAL
  Filled 2020-11-18: qty 2

## 2020-11-18 MED ORDER — LACTATED RINGERS IV BOLUS: 1000.0000 mL | Freq: Once | INTRAVENOUS | Status: DC

## 2020-11-18 NOTE — Discharge Instructions (Addendum)
We recommend follow-up with your primary care provider on this matter. You should also follow-up with cardiology for further testing.

## 2020-11-18 NOTE — ED Triage Notes (Signed)
BIB ems for sob.  Reports diaphoretic on ems arrival and hot to touch.  Pt Reports working out in the heat all day with little fluids.   Reports he was driving when he felt like he was going to pass out then became sob.  Reports became nausea.    Normal ekg by ems.  Bgl 119.  of ns by ems.  Hx of htn.    Reports that he feels much better at present.

## 2020-11-18 NOTE — ED Provider Notes (Signed)
[facility] EMERGENCY DEPARTMENT Provider Note   CSN: [id number] Arrival date & time: 11/18/20  1603     History Chief Complaint  Patient presents with   Near Syncope    Nicolas [name] is a 45 y.o. male.  HPI     Nicolas [name] is a 45 y.o. male, with a history of 2 pack/day smoking, HTN, presenting to the ED with lightheadedness and shortness of breath.  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  He took breaks to eat, but has not drank any water today. 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  While driving, he began to feel lightheaded, generalized weakness, cramping, nausea, urge to have a bowel movement.  This was followed by shortness of breath. He was able to pull his car to the side of the road.  No traumatic injuries. Patient received 500 cc IV normal saline via EMS, after which he began to feel better.  Denies alcohol or illicit drug use.  Denies recent illness, fever/chills, chest pain, syncope, vomiting, abdominal pain, seizure, or any other complaints.   Past Medical History:  Diagnosis Date   History of stomach ulcers    Hypertension    Kidney stone    bil , different times  passed stones   Migraines    Pancreatitis     Patient Active Problem List   Diagnosis Date Noted   Acute bacterial conjunctivitis of both eyes 10/21/2020   Allergic conjunctivitis of both eyes 10/21/2020   Flank pain 05/04/2020   Acute cystitis without hematuria 05/04/2020   Protein-calorie malnutrition, severe (HCC) 02/11/2014   Peptic ulcer disease 02/10/2014   Gastric ulcer 02/09/2014   Nausea & vomiting 02/09/2014   Nausea with vomiting 02/08/2014   Tobacco use disorder 02/08/2014   Obesity 02/01/2014   Alcohol use 01/31/2014   Acute pancreatitis 01/30/2014   Musculoskeletal chest pain 01/19/2014   Essential  hypertension 10/24/2012    Past Surgical History:  Procedure Laterality Date   BIOPSY N/A 02/11/2014   Procedure: BIOPSY;  Surgeon: Corbin Ade, MD;  Location: [facility];  Service: Endoscopy;  Laterality: N/A;   CARDIAC CATHETERIZATION  Aug 2015   normal EF, normal coronaries   CARDIAC CATHETERIZATION     ESOPHAGOGASTRODUODENOSCOPY (EGD) WITH PROPOFOL N/A 02/11/2014   Procedure: ESOPHAGOGASTRODUODENOSCOPY (EGD) WITH PROPOFOL;  Surgeon: Corbin Ade, MD;  Location: [facility];  Service: Endoscopy;  Laterality: N/A;   LEFT HEART CATHETERIZATION WITH CORONARY ANGIOGRAM N/A 01/18/2014   Procedure: LEFT HEART CATHETERIZATION WITH CORONARY ANGIOGRAM;  Surgeon: Lennette Bihari, MD;  Location: [facility] CATH LAB;  Service: Cardiovascular;  Laterality: N/A;       Family History  Problem Relation Age of Onset   Cancer Mother        Breast   Colon cancer Neg Hx     Social History   Tobacco Use   Smoking status: Every Day    Packs/day: 2.00    Years: 10.00    Pack years: 20.00    Types: Cigarettes   Smokeless tobacco: Never  Vaping Use   Vaping Use: Never used  Substance Use Topics   Alcohol use: No   Drug use: No    Home Medications Prior to Admission medications   Medication Sig Start Date End Date Taking? Authorizing Provider  amLODipine (NORVASC) 5 MG tablet TAKE 1 TABLET BY  MOUTH ONCE DAILY. 10/19/20   [name], MD  erythromycin ophthalmic ointment Place 1 application into both eyes at bedtime. 10/21/20   Novella Olive, NP    Allergies    Patient has no known allergies.  Review of Systems   Review of Systems  Constitutional:  Positive for diaphoresis and fatigue. Negative for fever.  Respiratory:  Positive for shortness of breath. Negative for cough.   Cardiovascular:  Negative for chest pain and leg swelling.  Gastrointestinal:  Positive for nausea. Negative for abdominal pain and vomiting.  Musculoskeletal:  Positive for myalgias.  Neurological:  Positive for weakness and  light-headedness. Negative for seizures, syncope and speech difficulty.  All other systems reviewed and are negative.  Physical Exam Updated Vital Signs BP (!) 148/91   Pulse (!) 101   Temp 98 F (36.7 C)   Resp 20   Ht 6\' 3"  (1.905 m)   Wt 126.1 kg   SpO2 95%   BMI 34.75 kg/m   Physical Exam Vitals and nursing note reviewed.  Constitutional:      General: He is not in acute distress.    Appearance: He is well-developed. He is obese. He is not diaphoretic.  HENT:     Head: Normocephalic and atraumatic.     Mouth/Throat:     Mouth: Mucous membranes are moist.     Pharynx: Oropharynx is clear.  Eyes:     Conjunctiva/sclera: Conjunctivae normal.  Cardiovascular:     Rate and Rhythm: Normal rate and regular rhythm.     Pulses: Normal pulses.          Radial pulses are 2+ on the right side and 2+ on the left side.       Posterior tibial pulses are 2+ on the right side and 2+ on the left side.     Heart sounds: Normal heart sounds.     Comments: Tactile temperature in the extremities appropriate and equal bilaterally. Pulmonary:     Effort: Pulmonary effort is normal. No respiratory distress.     Breath sounds: Normal breath sounds.  Abdominal:     Palpations: Abdomen is soft.     Tenderness: There is no abdominal tenderness. There is no guarding.  Musculoskeletal:     Cervical back: Neck supple.     Right lower leg: No edema.     Left lower leg: No edema.  Lymphadenopathy:     Cervical: No cervical adenopathy.  Skin:    General: Skin is warm and dry.  Neurological:     Mental Status: He is alert and oriented to person, place, and time.     Comments: No noted acute cognitive deficit. Sensation grossly intact to light touch in the extremities.   Grip strengths equal bilaterally.   Strength 5/5 in all extremities.  No gait disturbance. Coordination intact.  Cranial nerves III-XII grossly intact.  Handles oral secretions without noted difficulty.  No noted phonation  or speech deficit. No facial droop.   Psychiatric:        Mood and Affect: Mood and affect normal.        Speech: Speech normal.        Behavior: Behavior normal.    ED Results / Procedures / Treatments   Labs (all labs ordered are listed, but only abnormal results are displayed) Labs Reviewed  COMPREHENSIVE METABOLIC PANEL - Abnormal; Notable for the following components:      Result Value   Glucose, Bld 135 (*)  BUN 24 (*)    All other components within normal limits  CBC WITH DIFFERENTIAL/PLATELET - Abnormal; Notable for the following components:   Abs Immature Granulocytes 0.08 (*)    All other components within normal limits  URINALYSIS, ROUTINE W REFLEX MICROSCOPIC - Abnormal; Notable for the following components:   APPearance HAZY (*)    Protein, ur 100 (*)    Bacteria, UA RARE (*)    All other components within normal limits  TROPONIN I (HIGH SENSITIVITY) - Abnormal; Notable for the following components:   Troponin I (High Sensitivity) 19 (*)    All other components within normal limits  RAPID URINE DRUG SCREEN, HOSP PERFORMED  LIPASE, BLOOD  ETHANOL  MAGNESIUM  TROPONIN I (HIGH SENSITIVITY)  TROPONIN I (HIGH SENSITIVITY)    EKG EKG Interpretation  Date/Time:  Thursday November 18 2020 16:11:41 EDT Ventricular Rate:  97 PR Interval:    QRS Duration: 104 QT Interval:  384 QTC Calculation: 488 R Axis:   50 Text Interpretation: Normal sinus rhythm Borderline prolonged QT interval Reconfirmed by Eber Hong ([id number]) on 11/18/2020 5:29:36 PM  Radiology DG Chest 2 View  Result Date: 11/18/2020 CLINICAL DATA:  Repeat exam with nipple markers. EXAM: CHEST - 2 VIEW COMPARISON:  November 18, 2020 (5:28 p.m.) FINDINGS: The heart size and mediastinal contours are within normal limits. Radiopaque nipple markers are noted, bilaterally. This overlies the previously noted focal opacity seen within the right lung base on the prior study. Both lungs are otherwise clear. The visualized  skeletal structures are unremarkable. IMPRESSION: No active cardiopulmonary disease. Electronically Signed   By: Aram Candela M.D.   On: 11/18/2020 19:18   DG Chest 2 View  Result Date: 11/18/2020 CLINICAL DATA:  Diaphoresis and presyncope. EXAM: CHEST - 2 VIEW COMPARISON:  Oct 16, 2019 FINDINGS: There is no evidence of acute infiltrate, pleural effusion or pneumothorax. An ill-defined, 1.0 cm nodular opacity is seen overlying the lateral aspect of the right lung base. This is not seen on the prior study. The heart size and mediastinal contours are within normal limits. The visualized skeletal structures are unremarkable. IMPRESSION: 1. No acute infiltrate. 2. Right basilar nodular opacity which may represent an overlying nipple shadow. Correlation with follow-up chest plain films with nipple markings is recommended to exclude underlying lung nodule. Electronically Signed   By: Aram Candela M.D.   On: 11/18/2020 18:24    Procedures Procedures   Medications Ordered in ED Medications  ondansetron (ZOFRAN) injection 4 mg (4 mg Intravenous Given 11/18/20 1636)  sodium chloride 0.9 % bolus 1,000 mL (0 mLs Intravenous Stopped 11/18/20 1743)  sodium chloride 0.9 % bolus 1,000 mL (0 mLs Intravenous Stopped 11/18/20 2110)  acetaminophen (TYLENOL) tablet 650 mg (650 mg Oral Given 11/18/20 1955)    ED Course  I have reviewed the triage vital signs and the nursing notes.  Pertinent labs & imaging results that were available during my care of the patient were reviewed by me and considered in my medical decision making (see chart for details).  Clinical Course as of 11/18/20 2136  Thu Nov 18, 2020  0347 I discussed the second troponin level with the patient.  He continues to be free of any chest complaints, shortness of breath, dizziness, etc. He does complain of a mild left-sided headache which is typical of headaches that occur when he has not eaten. [SJ]    Clinical Course User Index [SJ] Levaeh Vice, Hillard Danker, PA-C   MDM Rules/Calculators/A&P  Patient presents following an episode of lightheadedness, diaphoresis, shortness of breath. Patient is nontoxic appearing, afebrile, not tachycardic upon my assessment, not tachypneic, not hypotensive, maintains excellent SPO2 on room air, and is in no apparent distress.   I have reviewed the patient's chart to obtain more information.   I reviewed and interpreted the patient's labs and radiological studies. Patient had a small troponin bump.  This was not sustained and troponin dropped again. Heat stress is a possible explanation for the patient's presentation.  Panic reaction is also a possibility.  Regardless, it would be prudent for the patient follow-up with cardiology for further evaluation and testing.  Patient and his wife were given instructions for home care as well as return precautions.  Both parties voice understanding of these instructions, accept the plan, and are comfortable with discharge.    Findings and plan of care discussed with attending physician, Eber Hong, MD.   Vitals:   11/18/20 1730 11/18/20 1800 11/18/20 1927 11/18/20 2109  BP: (!) 147/76  135/69 109/69  Pulse: 98 (!) 101 78 88  Resp: 17 18 18 18   Temp:   98.6 F (37 C) 98.1 F (36.7 C)  TempSrc:   Oral Oral  SpO2: 98% 99% 96% 98%  Weight:      Height:         Final Clinical Impression(s) / ED Diagnoses Final diagnoses:  Near syncope    Rx / DC Orders ED Discharge Orders     None        11/18/20 2137    2138, MD 11/20/20 1505

## 2020-11-19 ENCOUNTER — Telehealth: Payer: Self-pay

## 2020-11-19 DIAGNOSIS — R778 Other specified abnormalities of plasma proteins: Secondary | ICD-10-CM

## 2020-11-19 NOTE — Telephone Encounter (Signed)
Pt states he is worried about his Troponin level. Pt went to ER yesterday for Near Syncope. Please advise. Thank you  (Pt reports no issues at this time and is aware provider is out of the office)

## 2020-11-19 NOTE — Telephone Encounter (Signed)
Wants Dr Lorin Picket to look over his test that was done at [facility] 11/18/2020 hospital wants him to follow up with cardiology but Pt wants Dr Lorin Picket to review first, he said the hospital was not happy with some of his test results.   Pt call back [phone]

## 2020-11-21 NOTE — Telephone Encounter (Signed)
Nurses I reviewed over the ER note, lab work and EKG I agree that he does need to see cardiology-this is to be thorough. Please make sure referral is in place Also the slight elevation of troponin probably related to the stress of the heat and possible moderate heat exhaustion.  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  He needs to stay well-hydrated and eat on a regular basis Also please go ahead and move up his appointment with me give him a slot somewhere within the next 2 weeks to be seen

## 2020-11-22 NOTE — Telephone Encounter (Signed)
Pt contacted and verbalized understanding. Pt transferred up front to set up appt. Cardiology referral placed.

## 2020-11-29 ENCOUNTER — Encounter: Payer: Self-pay | Admitting: Family Medicine

## 2020-11-29 ENCOUNTER — Ambulatory Visit (INDEPENDENT_AMBULATORY_CARE_PROVIDER_SITE_OTHER): Payer: 59 | Admitting: Family Medicine

## 2020-11-29 ENCOUNTER — Other Ambulatory Visit: Payer: Self-pay

## 2020-11-29 VITALS — BP 146/86 | HR 92 | Temp 98.6°F | Ht 73.75 in | Wt 277.0 lb

## 2020-11-29 DIAGNOSIS — Z0001 Encounter for general adult medical examination with abnormal findings: Secondary | ICD-10-CM | POA: Diagnosis not present

## 2020-11-29 DIAGNOSIS — L409 Psoriasis, unspecified: Secondary | ICD-10-CM

## 2020-11-29 DIAGNOSIS — R0609 Other forms of dyspnea: Secondary | ICD-10-CM

## 2020-11-29 DIAGNOSIS — R0600 Dyspnea, unspecified: Secondary | ICD-10-CM | POA: Diagnosis not present

## 2020-11-29 MED ORDER — OLOPATADINE HCL 0.1 % OP SOLN
1.0000 [drp] | Freq: Two times a day (BID) | OPHTHALMIC | 12 refills | Status: DC
Start: 2020-11-29 — End: 2021-06-27

## 2020-11-29 NOTE — Progress Notes (Signed)
   Subjective:    Patient ID: Nicolas [name], male    DOB: [birth year], 45 y.o.   MRN: [id number]  HPI The patient comes in today for a wellness visit.  Patient recently in the hospital with having a spell where he almost passed out and then he had a little bit dyspnea on exertion with that.  He also expressed a little bit of chest discomfort EKG looked good but troponin slightly elevated was discharged from the ER.  He smokes has history of obesity cholesterol issues blood pressure issues  A review of their health history was completed.  A review of medications was also completed.  Any needed refills; update refills  Eating habits: health conscious  Falls/  MVA accidents in past few months: none  Regular exercise:   Specialist pt sees on regular basis: none  Preventative health issues were discussed.   Additional concerns: none  I reviewed over the labs and the ER records  Review of Systems     Objective:   Physical Exam General-in no acute distress Eyes-no discharge Lungs-respiratory rate normal, CTA CV-no murmurs,RRR Extremities skin warm dry no edema Neuro grossly normal Behavior normal, alert       Assessment & Plan:  Adult wellness-complete.wellness physical was conducted today. Importance of diet and exercise were discussed in detail.  In addition to this a discussion regarding safety was also covered. We also reviewed over immunizations and gave recommendations regarding current immunization needed for age.  In addition to this additional areas were also touched on including: Preventative health exams needed:  Colonoscopy not indicated  Patient was advised yearly wellness exam  Recent DOE with abnormal troponin I do not feel he had a heart attack but the patient has significant risk factors including family history of obesity smoking elevated blood pressure very important for the patient to go ahead with cardiology consult  Nicotine patches discussed as a  way to quit  Psoriasis patient request dermatology consult  HTN bump up dose of amlodipine patient will follow-up again within 90 days to recheck blood pressure

## 2020-11-29 NOTE — Patient Instructions (Signed)
Nicotine Patches What is this medication? NICOTINE ([place]) helps you quit smoking. It reduces cravings for nicotine, the addictive substance found in tobacco. It may also help reduce symptoms of withdrawal. It is most effective when used in combination with astop-smoking program. This medicine may be used for other purposes; ask your health care provider orpharmacist if you have questions. COMMON BRAND NAME(S): Habitrol, Nicoderm CQ, Nicotrol What should I tell my care team before I take this medication? They need to know if you have any of these conditions: Diabetes Heart disease, angina, irregular heartbeat or previous heart attack High blood pressure Lung disease, including asthma Overactive thyroid Pheochromocytoma Seizures or a history of seizures Skin problems, like eczema Stomach problems or ulcers An unusual or allergic reaction to nicotine, adhesives, other medications, foods, dyes, or preservatives Pregnant or trying to get pregnant Breast-feeding How should I use this medication? This medication is for external use only. Follow the directions on the label. Do not cut or trim the patch. After applying the patch, wash your hands. Change the patch every day in the morning, keeping to a regular schedule. When you apply a new patch, use a new area of skin. Wait at least 1 week before usingthe same area again. This medication comes with INSTRUCTIONS FOR USE. Ask your pharmacist for directions on how to use this medication. Read the information carefully. Talkto your pharmacist or care team if you have questions. Talk to your care team about the use of this medication in children. Specialcare may be needed. Overdosage: If you think you have taken too much of this medicine contact apoison control center or emergency room at once. NOTE: This medicine is only for you. Do not share this medicine with others. What if I miss a dose? If you forget to replace a patch, use it as soon as  you can. Only use one patch at a time and do not leave on the skin for longer than directed. If a patch falls off, you can replace it, but keep to your schedule and remove the patchat the right time. What may interact with this medication? Certain medications for lung or breathing disease, like asthma Medications for blood pressure Medications for depression This list may not describe all possible interactions. Give your health care provider a list of all the medicines, herbs, non-prescription drugs, or dietary supplements you use. Also tell them if you smoke, drink alcohol, or use illegaldrugs. Some items may interact with your medicine. What should I watch for while using this medication? Visit your care team for regular checks on your progress. Talk to your care team about what you can do to improve your chances ofquitting. If you are going to need surgery, an MRI, CT scan, or other procedure, tell your care team that you are using this medication. You may need to remove thepatch before the procedure. What side effects may I notice from receiving this medication? Side effects that you should report to your care team as soon as possible: Allergic reactions-skin rash, itching, hives, swelling of the face, lips, tongue, or throat Heart palpitations-rapid, pounding, or irregular heartbeat Increase in blood pressure Side effects that usually do not require medical attention (report to your careteam if they continue or are bothersome): Dizziness Headache Heartburn Hiccups Irritation at application site Trouble sleeping This list may not describe all possible side effects. Call your doctor for medical advice about side effects. You may report side effects to FDA at1-800-FDA-1088. Where should I keep my  medication? This product may have enough nicotine in it to make children and pets sick. Keep it away from children and pets. After using, throw away as directed on thepackage. Store at room  temperature between 20 and 25 degrees C (68 and 77 degrees F). Protect from heat and light. Keep this medication in the original containeruntil ready to use. Throw away unused medication after the expiration date. NOTE: This sheet is a summary. It may not cover all possible information. If you have questions about this medicine, talk to your doctor, pharmacist, orhealth care provider.  2022 Elsevier/Gold Standard (2020-07-05 12:16:58) Steps to Quit Smoking Smoking tobacco is the leading cause of preventable death. It can affect almost every organ in the body. Smoking puts you and people around you at risk for many serious, long-lasting (chronic) diseases. Quitting smoking can be hard, but it is one of the best things thatyou can do for your health. It is never too late to quit. How do I get ready to quit? When you decide to quit smoking, make a plan to help you succeed. Before you quit: Pick a date to quit. Set a date within the next 2 weeks to give you time to prepare. Write down the reasons why you are quitting. Keep this list in places where you will see it often. Tell your family, friends, and co-workers that you are quitting. Their support is important. Talk with your doctor about the choices that may help you quit. Find out if your health insurance will pay for these treatments. Know the people, places, things, and activities that make you want to smoke (triggers). Avoid them. What first steps can I take to quit smoking? Throw away all cigarettes at home, at work, and in your car. Throw away the things that you use when you smoke, such as ashtrays and lighters. Clean your car. Make sure to empty the ashtray. Clean your home, including curtains and carpets. What can I do to help me quit smoking? Talk with your doctor about taking medicines and seeing a counselor at the same time. You are more likely to succeed when you do both. If you are pregnant or breastfeeding, talk with your doctor  about counseling or other ways to quit smoking. Do not take medicine to help you quit smoking unless your doctor tells you to do so. To quit smoking: Quit right away Quit smoking totally, instead of slowly cutting back on how much you smoke over a period of time. Go to counseling. You are more likely to quit if you go to counseling sessions regularly. Take medicine You may take medicines to help you quit. Some medicines need a prescription, and some you can buy over-the-counter. Some medicines may contain a drug called nicotine to replace the nicotine in cigarettes. Medicines may: Help you to stop having the desire to smoke (cravings). Help to stop the problems that come when you stop smoking (withdrawal symptoms). Your doctor may ask you to use: Nicotine patches, gum, or lozenges. Nicotine inhalers or sprays. Non-nicotine medicine that is taken by mouth. Find resources Find resources and other ways to help you quit smoking and remain smoke-free after you quit. These resources are most helpful when you use them often. They include: Online chats with a Veterinary surgeon. Phone quitlines. Printed Materials engineer. Support groups or group counseling. Text messaging programs. Mobile phone apps. Use apps on your mobile phone or tablet that can help you stick to your quit plan. There are many free apps for mobile  phones and tablets as well as websites. Examples include Quit Guide from the [employer] and smokefree.gov  What things can I do to make it easier to quit?  Talk to your family and friends. Ask them to support and encourage you. Call a phone quitline (1-800-QUIT-NOW), reach out to support groups, or work with a Veterinary surgeon. Ask people who smoke to not smoke around you. Avoid places that make you want to smoke, such as: Bars. Parties. Smoke-break areas at work. Spend time with people who do not smoke. Lower the stress in your life. Stress can make you want to smoke. Try these things to help your  stress: Getting regular exercise. Doing deep-breathing exercises. Doing yoga. Meditating. Doing a body scan. To do this, close your eyes, focus on one area of your body at a time from head to toe. Notice which parts of your body are tense. Try to relax the muscles in those areas. How will I feel when I quit smoking? Day 1 to 3 weeks Within the first 24 hours, you may start to have some problems that come from quitting tobacco. These problems are very bad 2-3 days after you quit, but they do not often last for more than 2-3 weeks. You may get these symptoms: Mood swings. Feeling restless, nervous, angry, or annoyed. Trouble concentrating. Dizziness. Strong desire for high-sugar foods and nicotine. Weight gain. Trouble pooping (constipation). Feeling like you may vomit (nausea). Coughing or a sore throat. Changes in how the medicines that you take for other issues work in your body. Depression. Trouble sleeping (insomnia). Week 3 and afterward After the first 2-3 weeks of quitting, you may start to notice more positive results, such as: Better sense of smell and taste. Less coughing and sore throat. Slower heart rate. Lower blood pressure. Clearer skin. Better breathing. Fewer sick days. Quitting smoking can be hard. Do not give up if you fail the first time. Some people need to try a few times before they succeed. Do your best to stick to your quit plan, and talk with yourdoctor if you have any questions or concerns. Summary Smoking tobacco is the leading cause of preventable death. Quitting smoking can be hard, but it is one of the best things that you can do for your health. When you decide to quit smoking, make a plan to help you succeed. Quit smoking right away, not slowly over a period of time. When you start quitting, seek help from your doctor, family, or friends. This information is not intended to replace advice given to you by your health care provider. Make sure you  discuss any questions you have with your healthcare provider. Document Revised: 02/21/2019 Document Reviewed: 08/17/2018 Elsevier Patient Education  2022 [employer].

## 2020-12-08 ENCOUNTER — Telehealth: Payer: Self-pay | Admitting: Family Medicine

## 2020-12-08 NOTE — Telephone Encounter (Signed)
I will accept this baby to our practice thank you

## 2020-12-08 NOTE — Telephone Encounter (Signed)
Patient called to follow up on recent referrals to cardiology and dermatology. Still waiting to receive call for scheduling. Please advise at [phone].

## 2020-12-08 NOTE — Telephone Encounter (Signed)
Thank you-I appreciate the work you do on behalf of our patient

## 2020-12-08 NOTE — Telephone Encounter (Signed)
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 Baby is due in August 2022. Please advise at [phone].

## 2020-12-10 LAB — LIPID PANEL
Chol/HDL Ratio: 5.8 ratio — ABNORMAL HIGH (ref 0.0–5.0)
Cholesterol, Total: 196 mg/dL (ref 100–199)
HDL: 34 mg/dL — ABNORMAL LOW (ref >39)
LDL Chol Calc (NIH): 132 mg/dL — ABNORMAL HIGH (ref 0–99)
Triglycerides: 165 mg/dL — ABNORMAL HIGH (ref 0–149)
VLDL Cholesterol Cal: 30 mg/dL (ref 5–40)

## 2020-12-10 LAB — COMPREHENSIVE METABOLIC PANEL
ALT: 28 IU/L (ref 0–44)
AST: 15 IU/L (ref 0–40)
Albumin/Globulin Ratio: 1.9 (ref 1.2–2.2)
Albumin: 4.4 g/dL (ref 4.0–5.0)
Alkaline Phosphatase: 123 IU/L — ABNORMAL HIGH (ref 44–121)
BUN/Creatinine Ratio: 20 (ref 9–20)
BUN: 17 mg/dL (ref 6–24)
Bilirubin Total: 0.4 mg/dL (ref 0.0–1.2)
CO2: 24 mmol/L (ref 20–29)
Calcium: 9.5 mg/dL (ref 8.7–10.2)
Chloride: 103 mmol/L (ref 96–106)
Creatinine, Ser: 0.86 mg/dL (ref 0.76–1.27)
Globulin, Total: 2.3 g/dL (ref 1.5–4.5)
Glucose: 114 mg/dL — ABNORMAL HIGH (ref 65–99)
Potassium: 4.8 mmol/L (ref 3.5–5.2)
Sodium: 140 mmol/L (ref 134–144)
Total Protein: 6.7 g/dL (ref 6.0–8.5)
eGFR: 110 mL/min/{1.73_m2} (ref >59)

## 2020-12-10 LAB — HIV ANTIBODY (ROUTINE TESTING W REFLEX): HIV Screen 4th Generation wRfx: NONREACTIVE

## 2020-12-10 LAB — HEPATITIS C ANTIBODY: Hep C Virus Ab: <0.1 s/co ratio (ref 0.0–0.9)

## 2020-12-14 ENCOUNTER — Other Ambulatory Visit: Payer: Self-pay

## 2020-12-14 ENCOUNTER — Encounter: Payer: Self-pay | Admitting: Family Medicine

## 2020-12-14 ENCOUNTER — Ambulatory Visit (INDEPENDENT_AMBULATORY_CARE_PROVIDER_SITE_OTHER): Payer: 59 | Admitting: Family Medicine

## 2020-12-14 VITALS — BP 136/88 | Temp 96.8°F | Wt 282.0 lb

## 2020-12-14 DIAGNOSIS — Z79899 Other long term (current) drug therapy: Secondary | ICD-10-CM | POA: Diagnosis not present

## 2020-12-14 DIAGNOSIS — E785 Hyperlipidemia, unspecified: Secondary | ICD-10-CM

## 2020-12-14 DIAGNOSIS — R7301 Impaired fasting glucose: Secondary | ICD-10-CM | POA: Diagnosis not present

## 2020-12-14 DIAGNOSIS — I10 Essential (primary) hypertension: Secondary | ICD-10-CM

## 2020-12-14 MED ORDER — FLUTICASONE PROPIONATE 50 MCG/ACT NA SUSP: 2.0000 | Freq: Every day | NASAL | 4 refills | Status: DC

## 2020-12-14 MED ORDER — ROSUVASTATIN CALCIUM 5 MG PO TABS: 5.0000 mg | ORAL_TABLET | Freq: Every day | ORAL | 4 refills | Status: DC

## 2020-12-14 NOTE — Patient Instructions (Addendum)
Preventing High Cholesterol Cholesterol is a white, waxy substance similar to fat that the human body needs to help build cells. The liver makes all the cholesterol that a person's body needs. Having high cholesterol (hypercholesterolemia) increases your risk for heart disease and stroke. Extra or excess cholesterolcomes from the food that you eat. High cholesterol can often be prevented with diet and lifestyle changes. If you already have high cholesterol, you can control it with diet, lifestyle changes,and medicines. How can high cholesterol affect me? If you have high cholesterol, fatty deposits (plaques) may build up on the walls of your blood vessels. The blood vessels that carry blood away from your heart are called arteries. Plaques make the arteries narrower and stiffer. This in turn can: Restrict or block blood flow and cause blood clots to form. Increase your risk for heart attack and stroke. What can increase my risk for high cholesterol? This condition is more likely to develop in people who: Eat foods that are high in saturated fat or cholesterol. Saturated fat is mostly found in foods that come from animal sources. Are overweight. Are not getting enough exercise. Have a family history of high cholesterol (familial hypercholesterolemia). What actions can I take to prevent this? Nutrition  Eat less saturated fat. Avoid trans fats (partially hydrogenated oils). These are often found in margarine and in some baked goods, fried foods, and snacks bought in packages. Avoid precooked or cured meat, such as bacon, sausages, or meat loaves. Avoid foods and drinks that have added sugars. Eat more fruits, vegetables, and whole grains. Choose healthy sources of protein, such as fish, poultry, lean cuts of red meat, beans, peas, lentils, and nuts. Choose healthy sources of fat, such as: Nuts. Vegetable oils, especially olive oil. Fish that have healthy fats, such as omega-3 fatty acids.  These fish include mackerel or salmon.  Lifestyle Lose weight if you are overweight. Maintaining a healthy body mass index (BMI) can help prevent or control high cholesterol. It can also lower your risk for diabetes and high blood pressure. Ask your health care provider to help you with a diet and exercise plan to lose weight safely. Do not use any products that contain nicotine or tobacco, such as cigarettes, e-cigarettes, and chewing tobacco. If you need help quitting, ask your health care provider. Alcohol use Do not drink alcohol if: Your health care provider tells you not to drink. You are pregnant, may be pregnant, or are planning to become pregnant. If you drink alcohol: Limit how much you use to: 0-1 drink a day for women. 0-2 drinks a day for men. Be aware of how much alcohol is in your drink. In the [place] one drink equals one 12 oz bottle of beer (355 mL), one 5 oz glass of wine (148 mL), or one 1 oz glass of hard liquor (44 mL). Activity  Get enough exercise. Do exercises as told by your health care provider. Each week, do at least 150 minutes of exercise that takes a medium level of effort (moderate-intensity exercise). This kind of exercise: Makes your heart beat faster while allowing you to still be able to talk. Can be done in short sessions several times a day or longer sessions a few times a week. For example, on 5 days each week, you could walk fast or ride your bike 3 times a day for 10 minutes each time.  Medicines Your health care provider may recommend medicines to help lower cholesterol. This may be a medicine to lower   the amount of cholesterol that your liver makes. You may need medicine if: Diet and lifestyle changes have not lowered your cholesterol enough. You have high cholesterol and other risk factors for heart disease or stroke. Take over-the-counter and prescription medicines only as told by your health care provider. General information Manage your risk  factors for high cholesterol. Talk with your health care provider about all your risk factors and how to lower your risk. Manage other conditions that you have, such as diabetes or high blood pressure (hypertension). Have blood tests to check your cholesterol levels at regular points in time as told by your health care provider. Keep all follow-up visits as told by your health care provider. This is important. Where to find more information American Heart Association: www.heart.org National Heart, Lung, and Blood Institute: PopSteam.is Summary High cholesterol increases your risk for heart disease and stroke. By keeping your cholesterol level low, you can reduce your risk for these conditions. High cholesterol can often be prevented with diet and lifestyle changes. Work with your health care provider to manage your risk factors, and have your blood tested regularly. This information is not intended to replace advice given to you by your health care provider. Make sure you discuss any questions you have with your healthcare provider. Document Revised: 03/11/2019 Document Reviewed: 03/11/2019 Elsevier Patient Education  2022 Else[employer] Inc. Cholesterol Content in Foods Cholesterol is a waxy, fat-like substance that helps to carry fat in the blood. The body needs cholesterol in small amounts, but too much cholesterol can causedamage to the arteries and heart. Most people should eat less than 200 milligrams (mg) of cholesterol a day. Foods with cholesterol  Cholesterol is found in animal-based foods, such as meat, seafood, and dairy. Generally, low-fat dairy and lean meats have less cholesterol than full-fat dairy and fatty meats. The milligrams of cholesterol per serving (mg per serving) of common cholesterol-containing foods are listed below. Meat and other proteins Egg -- one large whole egg has 186 mg. Veal shank -- 4 oz has 141 mg. Lean ground [place] (93% lean) -- 4 oz has 118  mg. Fat-trimmed lamb loin -- 4 oz has 106 mg. Lean ground beef (90% lean) -- 4 oz has 100 mg. Lobster -- 3.5 oz has 90 mg. Pork loin chops -- 4 oz has 86 mg. Canned salmon -- 3.5 oz has 83 mg. Fat-trimmed beef top loin -- 4 oz has 78 mg. Frankfurter -- 1 frank (3.5 oz) has 77 mg. Crab -- 3.5 oz has 71 mg. Roasted chicken without skin, white meat -- 4 oz has 66 mg. Light bologna -- 2 oz has 45 mg. Deli-cut [place] -- 2 oz has 31 mg. Canned tuna -- 3.5 oz has 31 mg. Tomasa Blase -- 1 oz has 29 mg. Oysters and mussels (raw) -- 3.5 oz has 25 mg. Mackerel -- 1 oz has 22 mg. Trout -- 1 oz has 20 mg. Pork sausage -- 1 link (1 oz) has 17 mg. Salmon -- 1 oz has 16 mg. Tilapia -- 1 oz has 14 mg. Dairy Soft-serve ice cream --  cup (4 oz) has 103 mg. Whole-milk yogurt -- 1 cup (8 oz) has 29 mg. Cheddar cheese -- 1 oz has 28 mg. American cheese -- 1 oz has 28 mg. Whole milk -- 1 cup (8 oz) has 23 mg. 2% milk -- 1 cup (8 oz) has 18 mg. Cream cheese -- 1 tablespoon (Tbsp) has 15 mg. Cottage cheese --  cup (4 oz) has 14  mg. Low-fat (1%) milk -- 1 cup (8 oz) has 10 mg. Sour cream -- 1 Tbsp has 8.5 mg. Low-fat yogurt -- 1 cup (8 oz) has 8 mg. Nonfat Greek yogurt -- 1 cup (8 oz) has 7 mg. Half-and-half cream -- 1 Tbsp has 5 mg. Fats and oils Cod liver oil -- 1 tablespoon (Tbsp) has 82 mg. Butter -- 1 Tbsp has 15 mg. Lard -- 1 Tbsp has 14 mg. Bacon grease -- 1 Tbsp has 14 mg. Mayonnaise -- 1 Tbsp has 5-10 mg. Margarine -- 1 Tbsp has 3-10 mg. Exact amounts of cholesterol in these foods may vary depending on specificingredients and brands. Foods without cholesterol Most plant-based foods do not have cholesterol unless you combine them with a food that has cholesterol. Foods without cholesterol include: Grains and cereals. Vegetables. Fruits. Vegetable oils, such as olive, canola, and sunflower oil. Legumes, such as peas, beans, and lentils. Nuts and seeds. Egg whites. Summary The body needs  cholesterol in small amounts, but too much cholesterol can cause damage to the arteries and heart. Most people should eat less than 200 milligrams (mg) of cholesterol a day. This information is not intended to replace advice given to you by your health care provider. Make sure you discuss any questions you have with your healthcare provider. Document Revised: 09/09/2019 Document Reviewed: 10/20/2019 Elsevier Patient Education  2022 [employer]. [email or web address].mata.[email or web address]">  DASH Eating Plan DASH stands for Dietary Approaches to Stop Hypertension. The DASH eating plan is a healthy eating plan that has been shown to: Reduce high blood pressure (hypertension). Reduce your risk for type 2 diabetes, heart disease, and stroke. Help with weight loss. What are tips for following this plan? Reading food labels Check food labels for the amount of salt (sodium) per serving. Choose foods with less than 5 percent of the Daily Value of sodium. Generally, foods with less than 300 milligrams (mg) of sodium per serving fit into this eating plan. To find whole grains, look for the word "whole" as the first word in the ingredient list. Shopping Buy products labeled as "low-sodium" or "no salt added." Buy fresh foods. Avoid canned foods and pre-made or frozen meals. Cooking Avoid adding salt when cooking. Use salt-free seasonings or herbs instead of table salt or sea salt. Check with your health care provider or pharmacist before using salt substitutes. Do not fry foods. Cook foods using healthy methods such as baking, boiling, grilling, roasting, and broiling instead. Cook with heart-healthy oils, such as olive, canola, avocado, soybean, or sunflower oil. Meal planning  Eat a balanced diet that includes: 4 or more servings of fruits and 4 or more servings of vegetables each day. Try to fill one-half of your plate with fruits and vegetables. 6-8 servings of whole grains  each day. Less than 6 oz (170 g) of lean meat, poultry, or fish each day. A 3-oz (85-g) serving of meat is about the same size as a deck of cards. One egg equals 1 oz (28 g). 2-3 servings of low-fat dairy each day. One serving is 1 cup (237 mL). 1 serving of nuts, seeds, or beans 5 times each week. 2-3 servings of heart-healthy fats. Healthy fats called omega-3 fatty acids are found in foods such as walnuts, flaxseeds, fortified milks, and eggs. These fats are also found in cold-water fish, such as sardines, salmon, and mackerel. Limit how much you eat of: Canned or prepackaged foods. Food that is high in trans fat, such as some fried foods. Food that  is high in saturated fat, such as fatty meat. Desserts and other sweets, sugary drinks, and other foods with added sugar. Full-fat dairy products. Do not salt foods before eating. Do not eat more than 4 egg yolks a week. Try to eat at least 2 vegetarian meals a week. Eat more home-cooked food and less restaurant, buffet, and fast food.  Lifestyle When eating at a restaurant, ask that your food be prepared with less salt or no salt, if possible. If you drink alcohol: Limit how much you use to: 0-1 drink a day for women who are not pregnant. 0-2 drinks a day for men. Be aware of how much alcohol is in your drink. In the [place] one drink equals one 12 oz bottle of beer (355 mL), one 5 oz glass of wine (148 mL), or one 1 oz glass of hard liquor (44 mL). General information Avoid eating more than 2,300 mg of salt a day. If you have hypertension, you may need to reduce your sodium intake to 1,500 mg a day. Work with your health care provider to maintain a healthy body weight or to lose weight. Ask what an ideal weight is for you. Get at least 30 minutes of exercise that causes your heart to beat faster (aerobic exercise) most days of the week. Activities may include walking, swimming, or biking. Work with your health care provider or dietitian to  adjust your eating plan to your individual calorie needs. What foods should I eat? Fruits All fresh, dried, or frozen fruit. Canned fruit in natural juice (without addedsugar). Vegetables Fresh or frozen vegetables (raw, steamed, roasted, or grilled). Low-sodium or reduced-sodium tomato and vegetable juice. Low-sodium or reduced-sodium tomatosauce and tomato paste. Low-sodium or reduced-sodium canned vegetables. Grains Whole-grain or whole-wheat bread. Whole-grain or whole-wheat pasta. Brown rice. Orpah Cobb. Bulgur. Whole-grain and low-sodium cereals. Pita bread.Low-fat, low-sodium crackers. Whole-wheat flour tortillas. Meats and other proteins Skinless chicken or [place]. Ground chicken or [place]. Pork with fat trimmed off. Fish and seafood. Egg whites. Dried beans, peas, or lentils. Unsalted nuts, nut butters, and seeds. Unsalted canned beans. Lean cuts of beef with fat trimmed off. Low-sodium, lean precooked or cured meat, such as sausages or meatloaves. Dairy Low-fat (1%) or fat-free (skim) milk. Reduced-fat, low-fat, or fat-free cheeses. Nonfat, low-sodium ricotta or cottage cheese. Low-fat or nonfatyogurt. Low-fat, low-sodium cheese. Fats and oils Soft margarine without trans fats. Vegetable oil. Reduced-fat, low-fat, or light mayonnaise and salad dressings (reduced-sodium). Canola, safflower, olive, avocado, soybean, andsunflower oils. Avocado. Seasonings and condiments Herbs. Spices. Seasoning mixes without salt. Other foods Unsalted popcorn and pretzels. Fat-free sweets. The items listed above may not be a complete list of foods and beverages you can eat. Contact a dietitian for more information. What foods should I avoid? Fruits Canned fruit in a light or heavy syrup. Fried fruit. Fruit in cream or buttersauce. Vegetables Creamed or fried vegetables. Vegetables in a cheese sauce. Regular canned vegetables (not low-sodium or reduced-sodium). Regular canned tomato sauce and paste  (not low-sodium or reduced-sodium). Regular tomato and vegetable juice(not low-sodium or reduced-sodium). Rosita Fire. Olives. Grains Baked goods made with fat, such as croissants, muffins, or some breads. Drypasta or rice meal packs. Meats and other proteins Fatty cuts of meat. Ribs. Fried meat. Tomasa Blase. Bologna, salami, and other precooked or cured meats, such as sausages or meat loaves. Fat from the back of a pig (fatback). Bratwurst. Salted nuts and seeds. Canned beans with added salt. Canned orsmoked fish. Whole eggs or egg yolks. Chicken or  [place] with skin. Dairy Whole or 2% milk, cream, and half-and-half. Whole or full-fat cream cheese. Whole-fat or sweetened yogurt. Full-fat cheese. Nondairy creamers. Whippedtoppings. Processed cheese and cheese spreads. Fats and oils Butter. Stick margarine. Lard. Shortening. Ghee. Bacon fat. Tropical oils, suchas coconut, palm kernel, or palm oil. Seasonings and condiments Onion salt, garlic salt, seasoned salt, table salt, and sea salt. Worcestershire sauce. Tartar sauce. Barbecue sauce. Teriyaki sauce. Soy sauce, including reduced-sodium. Steak sauce. Canned and packaged gravies. Fish sauce. Oyster sauce. Cocktail sauce. Store-bought horseradish. Ketchup. Mustard. Meat flavorings and tenderizers. Bouillon cubes. Hot sauces. Pre-made or packaged marinades. Pre-made or packaged taco seasonings. Relishes. Regular saladdressings. Other foods Salted popcorn and pretzels. The items listed above may not be a complete list of foods and beverages you should avoid. Contact a dietitian for more information. Where to find more information National Heart, Lung, and Blood Institute: PopSteam.is American Heart Association: www.heart.org Academy of Nutrition and Dietetics: www.eatright.org National Kidney Foundation: www.kidney.org Summary The DASH eating plan is a healthy eating plan that has been shown to reduce high blood pressure (hypertension). It may also  reduce your risk for type 2 diabetes, heart disease, and stroke. When on the DASH eating plan, aim to eat more fresh fruits and vegetables, whole grains, lean proteins, low-fat dairy, and heart-healthy fats. With the DASH eating plan, you should limit salt (sodium) intake to 2,300 mg a day. If you have hypertension, you may need to reduce your sodium intake to 1,500 mg a day. Work with your health care provider or dietitian to adjust your eating plan to your individual calorie needs. This information is not intended to replace advice given to you by your health care provider. Make sure you discuss any questions you have with your healthcare provider. Document Revised: 05/02/2019 Document Reviewed: 05/02/2019 Elsevier Patient Education  2022 [employer].

## 2020-12-14 NOTE — Telephone Encounter (Signed)
I am willing to see the child as patient here but the issue that would need to be discussed between actual son and [facility], if they are interested in pursuing [place] being the child's doctor they would need to get this issue cleared up because that issue is between the patient and [place] Recommend that the son talk with Carollee Herter to see what is the next steps if they are interested in clearing this up

## 2020-12-14 NOTE — Progress Notes (Signed)
   Subjective:    Patient ID: Nicolas [name], male    DOB: [birth year], 45 y.o.   MRN: [id number]  HPI Pt here for 2 week follow up. Pt had labs done 12/09/20. Pt has quit smoking(2 weeks ago); has gained 5 lbs despite walking 2 miles daily. Pt states nose has been stopped up since quitting smoking. Walking was bad the first week but the second week was better. Pt has not had any more episodes of feeling faint.   No congestion no cough No fever Taking claritin No help Has quit smoking (Pt grand daughter will be born in August and is needing a provider. Son has not been seen since 2016. )  Under some stress Causes to feel stressed  Review of Systems     Objective:   Physical Exam General-in no acute distress Eyes-no discharge Lungs-respiratory rate normal, CTA CV-no murmurs,RRR Extremities skin warm dry no edema Neuro grossly normal Behavior normal, alert   The conversation regarding cholesterol and medication was held with the patient as well as his wife Arline Asp on speaker phone     Assessment & Plan:  Blood pressure-decent control healthy diet recommended regular physical activity.  Patient has quit smoking which is fantastic.  Doing some walking on a regular basis.  Hopefully here see improvement in blood pressure even more.  Continue the blood pressure medicine in the evening  Elevated cholesterol with elevated risk for heart disease.  Patient will be having appointment with cardiology coming up in hopefully the near future more than likely to do stress Myoview  We will go ahead and start Crestor 5 mg daily side effects were discussed should do well repeat lab work before follow-up office visit  Fasting hyperglycemia minimize starches increase walking watch weight check A1c on follow-up  Nasal congestion at nighttime may well be related to dust he is exposed to with his job in [employer] continue the Claritin but add Flonase if ongoing troubles notify us  Follow-up 3  months

## 2020-12-17 ENCOUNTER — Encounter: Payer: 59 | Admitting: Family Medicine

## 2020-12-28 ENCOUNTER — Telehealth: Payer: Self-pay | Admitting: Family Medicine

## 2020-12-28 NOTE — Telephone Encounter (Signed)
Patient advised per Dr Ladona Ridgel: Stop medication for 1 wk, and see if the pain resolves or if the pain is still there in the arms.  Follow up with Dr. Lorin Picket in 1 week to let him know how the pain is. Patient verbalized understanding.

## 2020-12-28 NOTE — Telephone Encounter (Signed)
Patient states having issues with both arms hurting he thinks its coming from new chlorestol medication he was put on it started two days ago. Please advise

## 2020-12-28 NOTE — Telephone Encounter (Signed)
Stop medication for 1 wk, and see if the pain resolves or if the pain is still there in the arms.  Follow up with Dr. Lorin Picket in 1 week to let him know how the pain is.   Take care,   Dr. Ladona Ridgel

## 2021-01-10 ENCOUNTER — Encounter: Payer: Self-pay | Admitting: Family Medicine

## 2021-01-10 ENCOUNTER — Ambulatory Visit (INDEPENDENT_AMBULATORY_CARE_PROVIDER_SITE_OTHER): Payer: 59 | Admitting: Family Medicine

## 2021-01-10 ENCOUNTER — Other Ambulatory Visit: Payer: Self-pay

## 2021-01-10 VITALS — BP 136/77 | HR 93 | Temp 98.2°F | Ht 73.75 in | Wt 284.0 lb

## 2021-01-10 DIAGNOSIS — T63464A Toxic effect of venom of wasps, undetermined, initial encounter: Secondary | ICD-10-CM | POA: Diagnosis not present

## 2021-01-10 DIAGNOSIS — G5603 Carpal tunnel syndrome, bilateral upper limbs: Secondary | ICD-10-CM | POA: Diagnosis not present

## 2021-01-10 DIAGNOSIS — R0600 Dyspnea, unspecified: Secondary | ICD-10-CM

## 2021-01-10 DIAGNOSIS — R0609 Other forms of dyspnea: Secondary | ICD-10-CM

## 2021-01-10 MED ORDER — PREDNISONE 20 MG PO TABS: ORAL_TABLET | ORAL | 0 refills | Status: DC

## 2021-01-10 NOTE — Progress Notes (Signed)
   Subjective:    Patient ID: Nicolas [name], male    DOB: [birth year], 45 y.o.   MRN: [id number]  Hand Pain  Numbness, swelling swelling in the left arm ever since he got stung by bees no hives elsewhere no difficulty breathing  Also relates bilateral hand and arm pain with numbness present over the past several months worse over the past several weeks patient at first thought it was due to cholesterol medicine He did not have true myalgias in the other arms or legs Bee stings - 3 days ago    Review of Systems     Objective:   Physical Exam Blood pressure good on recheck Lungs clear heart regular Negative Tinel's Swollen left forearm from bee stings        Assessment & Plan:  Bee stings Prednisone taper Should gradually get better in  Probable carpal tunnel wear braces at nighttime Let us know if ongoing troubles Follow-up if progressive troubles If the braces do not help over the next 2 to 3 weeks recommend nerve conduction study  Patient had a history of shortness of breath with activity earlier this year he is quit smoking he would like to see cardiology for further this referral was made previously but patient never heard from it we will put the referral back in   I believe the patient can take cholesterol medicine I encouraged him to restart it if he is having trouble with it he will let us know follow-up blood work later this year

## 2021-02-03 ENCOUNTER — Encounter: Payer: Self-pay | Admitting: Internal Medicine

## 2021-02-03 ENCOUNTER — Ambulatory Visit (INDEPENDENT_AMBULATORY_CARE_PROVIDER_SITE_OTHER): Payer: 59 | Admitting: Internal Medicine

## 2021-02-03 ENCOUNTER — Other Ambulatory Visit: Payer: Self-pay

## 2021-02-03 ENCOUNTER — Telehealth: Payer: Self-pay | Admitting: Internal Medicine

## 2021-02-03 VITALS — BP 136/80 | HR 75 | Ht 73.73 in | Wt 285.0 lb

## 2021-02-03 DIAGNOSIS — R200 Anesthesia of skin: Secondary | ICD-10-CM | POA: Diagnosis not present

## 2021-02-03 NOTE — Telephone Encounter (Signed)
Patient returned Nicolas [name]'s call. Advised she is currently in clinic and I would give her message to call him back.

## 2021-02-03 NOTE — Progress Notes (Signed)
Cardiology Office Note   Date:  02/03/2021   ID:  Nicolas [name], DOB [birth year], MRN [id number]  PCP:  [name], MD  Cardiologist:   Dietrich Pates, MD   Pt referred for evaluation of SOB and trivial elevation in trop    History of Present Illness: Nicolas [name] is a 45 y.o. male with a history of HTN, tobacco abuse   Follow by S Luking   Pt had a L heart cath done in Aug 2015 that showed minimal dz.    The pt was seen in ED on 11/18/20   Pt complained of lightheadedness and SOB    Had been working outside  Driving when it occurred   Got weak, nauseated, SOB   EMS called   Received IF fluids  Troponin levels 5, 19, 16     Pt followed and sent home   Since then he denies CP   Breathing was OK  No further dizziness.   Does feel his hart skip at times   He is active   Works outside    [employer] with hand pain bilaterally Works with Building services engineer dx as carpal tunnel     Breakfast    egg, bacon, toast Lunch   Skips or taco bell Dinner   Sallad with tuna or chicken     Current Meds  Medication Sig   amLODipine (NORVASC) 5 MG tablet TAKE 1 TABLET BY MOUTH ONCE DAILY.   erythromycin ophthalmic ointment Place 1 application into both eyes at bedtime.   fluticasone (FLONASE) 50 MCG/ACT nasal spray Place 2 sprays into both nostrils daily.   olopatadine (PATANOL) 0.1 % ophthalmic solution Place 1 drop into both eyes 2 (two) times daily.   rosuvastatin (CRESTOR) 5 MG tablet Take 1 tablet (5 mg total) by mouth daily.     Allergies:   Patient has no known allergies.   Past Medical History:  Diagnosis Date   History of stomach ulcers    Hypertension    Kidney stone    bil , different times  passed stones   Migraines    Pancreatitis     Past Surgical History:  Procedure Laterality Date   BIOPSY N/A 02/11/2014   Procedure: BIOPSY;  Surgeon: Corbin Ade, MD;  Location: [facility];  Service: Endoscopy;  Laterality: N/A;   CARDIAC CATHETERIZATION  Aug 2015   normal EF, normal coronaries    CARDIAC CATHETERIZATION     ESOPHAGOGASTRODUODENOSCOPY (EGD) WITH PROPOFOL N/A 02/11/2014   Procedure: ESOPHAGOGASTRODUODENOSCOPY (EGD) WITH PROPOFOL;  Surgeon: Corbin Ade, MD;  Location: [facility];  Service: Endoscopy;  Laterality: N/A;   LEFT HEART CATHETERIZATION WITH CORONARY ANGIOGRAM N/A 01/18/2014   Procedure: LEFT HEART CATHETERIZATION WITH CORONARY ANGIOGRAM;  Surgeon: Lennette Bihari, MD;  Location: [facility] CATH LAB;  Service: Cardiovascular;  Laterality: N/A;     Social History:  The patient  reports that he quit smoking about 1 months ago. His smoking use included cigarettes. He has a 20.00 pack-year smoking history. He has never used smokeless tobacco. He reports that he does not drink alcohol and does not use drugs.   Family History:  The patient's family history includes Cancer in his mother.    ROS:  Please see the history of present illness. All other systems are reviewed and  Negative to the above problem except as noted.    PHYSICAL EXAM: VS:  BP 136/80 (BP Location: Left Arm, Patient Position: Sitting, Cuff Size: Normal)   Pulse  75   Ht 6' 1.73" (1.873 m)   Wt 285 lb (129.3 kg)   SpO2 97%   BMI 36.87 kg/m   GEN: Morbidly obese 45 yo  in no acute distress  HEENT: normal  Neck: no JVD, carotid bruits Cardiac: RRR; no murmurs, No LE edema  Respiratory:  clear to auscultation bilaterally, normal work of breathing GI: soft, nontender, nondistended, + BS  No hepatomegaly  MS: no deformity Moving all extremities   Skin: warm and dry, no rash Neuro:  Strength and sensation are intact Psych: euthymic mood, full affect   EKG:  EKG is not  ordered today.  On 11/18/20  NSR    Lipid Panel    Component Value Date/Time   CHOL 196 12/09/2020 0810   TRIG 165 (H) 12/09/2020 0810   HDL 34 (L) 12/09/2020 0810   CHOLHDL 5.8 (H) 12/09/2020 0810   CHOLHDL 5.1 01/31/2014 0532   VLDL 20 01/31/2014 0532   LDLCALC 132 (H) 12/09/2020 0810      Wt Readings from Last 3 Encounters:   02/03/21 285 lb (129.3 kg)  01/10/21 284 lb (128.8 kg)  12/14/20 282 lb (127.9 kg)      ASSESSMENT AND PLAN:  1  Dyspnea.   Pt with epiosode of SOB   June   He says he has not had any significant since   Follow  I am not convinced cardiac  2  Lipids    Will forward  S. Luking to check lipomed panel with Lp(a) and ApoB    CT scan showed scattered atherosclerosis of aorta. (02/05/20)   Should be tighter  He is now on low dose 5 mg Crestor  3  HTN   BP is fair    He can increased to 5 bid   Follow  4  Obesity   Discussed diet   Cut back on carbs, processed foods   Limit eggs to 5 per wk    watch carbs and sugar intake   TRE     5  Ortho  Referral placed to evaluate   ? If he has carpal tunnel   Would be important to document, for further follow up if SOB worsens       Current medicines are reviewed at length with the patient today.  The patient does not have concerns regarding medicines.  Signed, Dietrich Pates, MD  02/03/2021 2:42 PM    [facility] [address] [place], [place], [place]  [address] Phone: [phone]; Fax: [phone]

## 2021-02-03 NOTE — Telephone Encounter (Signed)
Pt notified of referral to [facility].

## 2021-02-03 NOTE — Patient Instructions (Signed)
Medication Instructions:  Your physician recommends that you continue on your current medications as directed. Please refer to the Current Medication list given to you today.  *If you need a refill on your cardiac medications before your next appointment, please call your pharmacy*   Lab Work: NONE   If you have labs (blood work) drawn today and your tests are completely normal, you will receive your results only by: MyChart Message (if you have MyChart) OR A paper copy in the mail If you have any lab test that is abnormal or we need to change your treatment, we will call you to review the results.   Testing/Procedures: NONE    Follow-Up: At [facility], you and your health needs are our priority.  As part of our continuing mission to provide you with exceptional heart care, we have created designated Provider Care Teams.  These Care Teams include your primary Cardiologist (physician) and Advanced Practice Providers (APPs -  Physician Assistants and Nurse Practitioners) who all work together to provide you with the care you need, when you need it.  We recommend signing up for the patient portal called "MyChart".  Sign up information is provided on this After Visit Summary.  MyChart is used to connect with patients for Virtual Visits (Telemedicine).  Patients are able to view lab/test results, encounter notes, upcoming appointments, etc.  Non-urgent messages can be sent to your provider as well.   To learn more about what you can do with MyChart, go to [email or web address].    Your next appointment:    As Needed   The format for your next appointment:   In Person  Provider:   Paula Ross, MD   Other Instructions Thank you for choosing [facility]!    

## 2021-02-04 ENCOUNTER — Other Ambulatory Visit: Payer: Self-pay | Admitting: Family Medicine

## 2021-02-21 ENCOUNTER — Telehealth: Payer: Self-pay | Admitting: Family Medicine

## 2021-02-21 NOTE — Telephone Encounter (Signed)
So noted thank you for keeping me in the loop I will discuss further with the patient when he follows up thank you

## 2021-02-21 NOTE — Telephone Encounter (Signed)
I am full today Soonest to be able to be worked in would be Wednesday afternoon 3:10 PM or 330 if the patient would like

## 2021-02-21 NOTE — Telephone Encounter (Signed)
Pt contacted. Pt states Wednesday at 3:30 is fine. Pt placed on schedule.  Pt states when he called to schedule the appt, Alcario Drought was rude. Pt states he was not offered an appointment and told to go to Urgent Care. (Informed pt that we have been sending a lot of patients to Urgent Care due to no availability) Pt states he was not asking to be seen today, just next available. Pt states he was hung up on. Pt called back a second time and spoke to [facility], no issues with second call. Informed pt of [place] Complaint Hotline; Pt wanted nurse to tell someone in office

## 2021-02-21 NOTE — Telephone Encounter (Signed)
Patient states still hurting in his side and been going on for a year. He states hAS GONE TO SPECIALIST AND THEY HAVENT FOUND ANYTHING BUT STILL HURTING IN SIDE . hAD TRIAGE BY NURSE SHE SAID uRGENT CARE. pLEASE ADVISE

## 2021-02-21 NOTE — Telephone Encounter (Signed)
Please advise. Thank you

## 2021-02-23 ENCOUNTER — Ambulatory Visit (INDEPENDENT_AMBULATORY_CARE_PROVIDER_SITE_OTHER): Payer: 59 | Admitting: Family Medicine

## 2021-02-23 ENCOUNTER — Other Ambulatory Visit: Payer: Self-pay

## 2021-02-23 VITALS — BP 132/78

## 2021-02-23 DIAGNOSIS — R109 Unspecified abdominal pain: Secondary | ICD-10-CM | POA: Diagnosis not present

## 2021-02-23 DIAGNOSIS — M549 Dorsalgia, unspecified: Secondary | ICD-10-CM

## 2021-02-23 MED ORDER — CYCLOBENZAPRINE HCL 10 MG PO TABS: ORAL_TABLET | ORAL | 0 refills | Status: DC

## 2021-02-23 NOTE — Patient Instructions (Addendum)
Do your labs by the end of the month  Recheck here in about 3 to 4 weeks  We will set you up with [facility]  We will set up your scan and call you  May use 2 aleve twice a day as needed for the pain

## 2021-02-23 NOTE — Progress Notes (Signed)
   Subjective:    Patient ID: Ruby Dilone, male    DOB: Aug 17, 1975, 45 y.o.   MRN: [id number]  HPI Right lower back/ side pain going on for about a year worsening lately Significant right side pain and discomfort he states it constantly aches it keeps him up at night hurts with certain movements denies radiation down the legs denies nausea or vomiting denies hematuria has a history of kidney stones also has a previous MRI which showed some central disc herniation Patient relates that the pain and discomfort is to the point where it is really impacted his ability to do his job plus also have any type of quality care at home Patient relates significant pain Pain is located in the right side as well as into the right flank not tender to the touch Review of Systems     Objective:   Physical Exam  General-in no acute distress Eyes-no discharge Lungs-respiratory rate normal, CTA CV-no murmurs,RRR Extremities skin warm dry no edema Neuro grossly normal Behavior normal, alert  Not tender to the touch negative percussion     Assessment & Plan:  Flank pain as well as right side abdominal pain severe over the past year getting worse we need to make sure that we are not missing something including the possibility of a tumor or growth therefore CT scan abdomen pelvis with contrast would be advisable  If the scan comes back normal then it is radiation of pain from his spine and seeing a spinal specialist would be the next step  Patient also saw cardiologist recently who felt he was developing carpal tunnel so therefore they recommended seeing orthopedic specialist for that

## 2021-02-24 NOTE — Addendum Note (Signed)
Addended by: Marlowe Shores on: 02/24/2021 01:49 PM   Modules accepted: Orders

## 2021-02-24 NOTE — Progress Notes (Signed)
02/24/21- referral placed 

## 2021-02-28 ENCOUNTER — Telehealth: Payer: Self-pay | Admitting: Family Medicine

## 2021-02-28 NOTE — Telephone Encounter (Signed)
Nicolas [name]     Exam was scheduled without a Pre Cert on File - I have sent everything to [facility] and placed as Urgent at this time.

## 2021-02-28 NOTE — Telephone Encounter (Signed)
Melissa at [facility] pre service center called to inquire about pre authorization for CT.   CB# [phone]  ext [phone]

## 2021-02-28 NOTE — Telephone Encounter (Signed)
So at this point in time I believe it would be wise to make sure the patient is aware that we are trying to get his insurance companies approval, hopefully the approval will be before the imaging, if for some reason it is not the scan will need to be rescheduled  (Please try to reassure the patient the CAT scan will get done but with insurance company snafus we have to jump through some hoops and it is possible that it may have to be delayed a few days)

## 2021-03-02 ENCOUNTER — Ambulatory Visit (HOSPITAL_COMMUNITY): Payer: 59

## 2021-03-03 ENCOUNTER — Other Ambulatory Visit: Payer: Self-pay

## 2021-03-03 ENCOUNTER — Ambulatory Visit (HOSPITAL_COMMUNITY)
Admission: RE | Admit: 2021-03-03 | Discharge: 2021-03-03 | Disposition: A | Discharge status: Home / Self Care | Payer: 59 | Source: Ambulatory Visit | Attending: Family Medicine | Admitting: Family Medicine

## 2021-03-03 DIAGNOSIS — M549 Dorsalgia, unspecified: Secondary | ICD-10-CM | POA: Diagnosis present

## 2021-03-03 DIAGNOSIS — R109 Unspecified abdominal pain: Secondary | ICD-10-CM | POA: Diagnosis not present

## 2021-03-03 LAB — POCT I-STAT CREATININE: Creatinine, Ser: 0.8 mg/dL (ref 0.61–1.24)

## 2021-03-03 MED ORDER — IOHEXOL 350 MG/ML SOLN
80.0000 mL | Freq: Once | INTRAVENOUS | Status: AC | PRN
  Administered 2021-03-03: 80 mL via INTRAVENOUS

## 2021-03-04 ENCOUNTER — Telehealth: Payer: Self-pay | Admitting: Family Medicine

## 2021-03-04 NOTE — Telephone Encounter (Signed)
Patient called states that we should have CT results by now. He is requesting something for pain.  CB# [phone]

## 2021-03-04 NOTE — Telephone Encounter (Signed)
Please advise. Thank you

## 2021-03-06 NOTE — Telephone Encounter (Signed)
Please see image result note sign We can send them a limited amount of pain medicine If this becomes where he needs regular pain medicine we would have to do a pain management visit with urine drug screen and reviewing contract

## 2021-03-07 ENCOUNTER — Other Ambulatory Visit: Payer: Self-pay

## 2021-03-07 ENCOUNTER — Ambulatory Visit (INDEPENDENT_AMBULATORY_CARE_PROVIDER_SITE_OTHER): Payer: 59 | Admitting: Family Medicine

## 2021-03-07 DIAGNOSIS — M549 Dorsalgia, unspecified: Secondary | ICD-10-CM

## 2021-03-07 MED ORDER — DICLOFENAC SODIUM 75 MG PO TBEC: 75.0000 mg | DELAYED_RELEASE_TABLET | Freq: Two times a day (BID) | ORAL | 0 refills | Status: DC

## 2021-03-07 MED ORDER — HYDROCODONE-ACETAMINOPHEN 10-325 MG PO TABS: ORAL_TABLET | ORAL | 0 refills | Status: DC

## 2021-03-07 NOTE — Progress Notes (Signed)
Patient relates  Subjective:    Patient ID: Nicolas [name], male    DOB: Jul 25, 1975, 45 y.o.   MRN: [id number]  HPI Patient arrives to discuss results of recent CT scan.  Patient states his pain has not improved.  Patient relates frequent right side back pain discomfort hurts with certain movements denies any intra-abdominal symptoms.  No nausea vomiting diarrhea.  Energy level okay.  States muscle relaxer caused him to be too sleepy Review of Systems     Objective:   Physical Exam Lungs clear heart regular subjective discomfort on the right side of the lower back       Assessment & Plan:  Lower thoracic right lower back pain-referral to [facility] Dr.[name] for further evaluation may need injections may need consultation with other specialists within that group as well as possible physical therapy.  Voltaren twice daily over the course the next few weeks follow-up if progressive troubles  Hydrocodone for severe pain caution drowsiness for home use only hopefully will not need it long-term  No additional testing currently

## 2021-03-08 ENCOUNTER — Other Ambulatory Visit: Payer: Self-pay | Admitting: Family Medicine

## 2021-03-08 DIAGNOSIS — I10 Essential (primary) hypertension: Secondary | ICD-10-CM

## 2021-03-16 ENCOUNTER — Ambulatory Visit: Payer: 59 | Admitting: Family Medicine

## 2021-03-21 ENCOUNTER — Ambulatory Visit (HOSPITAL_COMMUNITY): Payer: 59

## 2021-03-24 ENCOUNTER — Other Ambulatory Visit: Payer: Self-pay | Admitting: Family Medicine

## 2021-04-04 ENCOUNTER — Telehealth: Payer: Self-pay | Admitting: Family Medicine

## 2021-04-04 ENCOUNTER — Other Ambulatory Visit: Payer: Self-pay | Admitting: Family Medicine

## 2021-04-04 MED ORDER — HYDROCODONE-ACETAMINOPHEN 10-325 MG PO TABS: ORAL_TABLET | ORAL | 0 refills | Status: DC

## 2021-04-04 NOTE — Telephone Encounter (Signed)
Patient stated that the Dr. Who Mr. Neville was referred to for his back does not take his insurance. He asked if there was another specialist that could be recommended. He also stated he needed a refill on his pain medication.   [facility]  [phone]

## 2021-04-04 NOTE — Telephone Encounter (Signed)
Please advise. Thank you

## 2021-04-04 NOTE — Telephone Encounter (Signed)
1.  Refill was sent in as requested 2.  Please make sure that Toni Amend is aware of the patient's situation.  Lets do our best to refer him to a back specialist that is on his insurance either a orthopedic spine specialist is my first preference or neurosurgery specialist

## 2021-04-05 NOTE — Telephone Encounter (Signed)
Patient informed and informed referral specialists will be looking into ortho referral for him.

## 2021-04-11 ENCOUNTER — Telehealth: Payer: 59 | Admitting: Internal Medicine

## 2021-04-11 DIAGNOSIS — R200 Anesthesia of skin: Secondary | ICD-10-CM

## 2021-04-11 NOTE — Telephone Encounter (Signed)
Patient's wife calling back in regards to a referral that was supposed to go to the hand specialist. She stated that they haven't heard anything for it. Please advise

## 2021-04-13 NOTE — Telephone Encounter (Signed)
Per Dr. Tenny Craw pt has been referred to the hand center of [facility].

## 2021-04-13 NOTE — Telephone Encounter (Signed)
Pt.notified

## 2021-04-22 ENCOUNTER — Telehealth: Payer: Self-pay | Admitting: Family Medicine

## 2021-04-22 NOTE — Telephone Encounter (Signed)
Patient stated he has been waiting for a specialist to call him about his hands. Stated they are now swelling and getting very red, feeling like they are going to explode. Please advise.  Pt#:  [phone]

## 2021-05-12 ENCOUNTER — Other Ambulatory Visit: Payer: Self-pay

## 2021-05-12 ENCOUNTER — Ambulatory Visit (INDEPENDENT_AMBULATORY_CARE_PROVIDER_SITE_OTHER): Payer: 59 | Admitting: Orthopedic Surgery

## 2021-05-12 DIAGNOSIS — R200 Anesthesia of skin: Secondary | ICD-10-CM | POA: Insufficient documentation

## 2021-05-12 MED ORDER — DICLOFENAC SODIUM 75 MG PO TBEC: 75.0000 mg | DELAYED_RELEASE_TABLET | Freq: Two times a day (BID) | ORAL | 0 refills | Status: AC

## 2021-05-12 NOTE — Progress Notes (Signed)
Office Visit Note   Patient: Nicolas [name]           Date of Birth: [birth year]           MRN: [id number] Visit Date: 05/12/2021              Requested by: Fay Records, MD [place],  [place] [address] PCP: Kathyrn Drown, MD   Assessment & Plan: Visit Diagnoses:  1. Bilateral hand numbness     Plan: Will plan on getting electrodiagnostic studies of both upper extremities to evaluate for carpal and/or cubital tunnel syndrome.  He has symptoms in all fingers with provocative signs at both the elbow and wrist.  I will see him back in the office after the study is completed.  He can continue to wear the braces at night as tolerated.  I will also prescribe a stronger anti-inflammatory as ibuprofen seems to help some.   Follow-Up Instructions: No follow-ups on file.   Orders:  No orders of the defined types were placed in this encounter.  No orders of the defined types were placed in this encounter.     Procedures: No procedures performed   Clinical Data: No additional findings.   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  Is going on for 7 or 8 months.  He says he has burning and tingling sensation all of his fingers.  Is progressively worsened since it was first noticed.  He wakes every single night with numbness in his fingers and soaks them in warm water for symptom relief.  He notes that his been dropping things lately and is noticing clumsiness with his fingers.  All his fingers are tingling today.  His right side is equal to his left side.  He has been taking Motrin with some symptom relief.  Is never had electrodiagnostic studies.  He has no history of diabetes, hypothyroidism, wrist trauma, or cervical spine issue.  He has been wearing braces at night but has had  some difficulty doing so.  He does have similar wrist range of motion since he was a child.  No supination beyond neutral.  He denies any radiculopathy or neck pain.   Review of Systems   Objective: Vital Signs: There were no vitals taken for this visit.  Physical Exam Constitutional:      Appearance: Normal appearance.  Cardiovascular:     Rate and Rhythm: Normal rate.     Pulses: Normal pulses.  Pulmonary:     Effort: Pulmonary effort is normal.  Skin:    General: Skin is warm and dry.     Capillary Refill: Capillary refill takes less than 2 seconds.  Neurological:     Mental Status: He is alert.    Right Hand Exam   Tenderness  The patient is experiencing no tenderness.   Range of Motion  Wrist  Pronation:  90  Supination:  0   Muscle Strength  The patient has normal right wrist strength.  Other  Erythema: absent Sensation: decreased Pulse: present  Comments:  + Phalen at wrist.  + Tinel at elbow. Negative Tinel at wrist, negative CT compression test.  5/5 thenar motor strength without atrophy.    Left Hand Exam   Tenderness  The patient is experiencing no tenderness.  Range of Motion  Wrist  Pronation:  90  Supination:  0   Muscle Strength  The patient has normal left wrist strength.  Other  Erythema: absent Sensation: decreased Pulse: present  Comments:  + Phalen at wrist.  + Tinel at elbow.  Negative Tinel at wrist, negative CT compression test.  5/5 thenar motor strength without atrophy.      Specialty Comments:  No specialty comments available.  Imaging: No results found.   PMFS History: Patient Active Problem List   Diagnosis Date Noted   Bilateral hand numbness 05/12/2021   Acute bacterial conjunctivitis of both eyes 10/21/2020   Allergic conjunctivitis of both eyes 10/21/2020   Flank pain 05/04/2020   Acute cystitis without hematuria 05/04/2020   Peptic ulcer disease 02/10/2014   Gastric ulcer 02/09/2014   Nausea &  vomiting 02/09/2014   Nausea with vomiting 02/08/2014   Obesity 02/01/2014   Alcohol use 01/31/2014   Acute pancreatitis 01/30/2014   Musculoskeletal chest pain 01/19/2014   Essential hypertension 10/24/2012   Past Medical History:  Diagnosis Date   History of stomach ulcers    Hypertension    Kidney stone    bil , different times  passed stones   Migraines    Pancreatitis     Family History  Problem Relation Age of Onset   Cancer Mother        Breast   Colon cancer Neg Hx     Past Surgical History:  Procedure Laterality Date   BIOPSY N/A 02/11/2014   Procedure: BIOPSY;  Surgeon: Corbin Ade, MD;  Location: [facility];  Service: Endoscopy;  Laterality: N/A;   CARDIAC CATHETERIZATION  Aug 2015   normal EF, normal coronaries   CARDIAC CATHETERIZATION     ESOPHAGOGASTRODUODENOSCOPY (EGD) WITH PROPOFOL N/A 02/11/2014   Procedure: ESOPHAGOGASTRODUODENOSCOPY (EGD) WITH PROPOFOL;  Surgeon: Corbin Ade, MD;  Location: [facility];  Service: Endoscopy;  Laterality: N/A;   LEFT HEART CATHETERIZATION WITH CORONARY ANGIOGRAM N/A 01/18/2014   Procedure: LEFT HEART CATHETERIZATION WITH CORONARY ANGIOGRAM;  Surgeon: Lennette Bihari, MD;  Location: [facility] CATH LAB;  Service: Cardiovascular;  Laterality: N/A;   Social History   Occupational History   Not on file  Tobacco Use   Smoking status: Former    Packs/day: 2.00    Years: 10.00    Pack years: 20.00    Types: Cigarettes    Quit date: 12/05/2020    Years since quitting: 0.4   Smokeless tobacco: Never  Vaping Use   Vaping Use: Never used  Substance and Sexual Activity   Alcohol use: No   Drug use: No   Sexual activity: Yes    Birth control/protection: None

## 2021-05-18 ENCOUNTER — Other Ambulatory Visit: Payer: Self-pay

## 2021-05-18 ENCOUNTER — Ambulatory Visit (INDEPENDENT_AMBULATORY_CARE_PROVIDER_SITE_OTHER): Payer: 59 | Admitting: Physical Medicine and Rehabilitation

## 2021-05-18 ENCOUNTER — Encounter: Payer: Self-pay | Admitting: Physical Medicine and Rehabilitation

## 2021-05-18 DIAGNOSIS — R202 Paresthesia of skin: Secondary | ICD-10-CM

## 2021-05-18 NOTE — Progress Notes (Signed)
Pt state pain, weakness and numbness in both hands. Pt state the pain wakes him up at night. Pt state he is right handed.   Numeric Pain Rating Scale and Functional Assessment Average Pain 5   In the last MONTH (on 0-10 scale) has pain interfered with the following?  1. General activity like being  able to carry out your everyday physical activities such as walking, climbing stairs, carrying groceries, or moving a chair?  Rating(10)   -BT, -Dye Allergies.

## 2021-05-19 ENCOUNTER — Telehealth: Payer: Self-pay | Admitting: Orthopedic Surgery

## 2021-05-19 NOTE — Telephone Encounter (Signed)
Patient's wife Arline Asp called asked if patient can get something stronger for pain. I heard patient in the background saying both hand are burning and there's a lot of pain in them. Patient asked for a call back concerning the pain he is experiencing.  The number to contact patient is [phone]

## 2021-05-23 NOTE — Procedures (Signed)
EMG & NCV Findings: Evaluation of the left median motor and the right median motor nerves showed prolonged distal onset latency (L7.0, R6.5 ms) and decreased conduction velocity (Elbow-Wrist, L46, R47 m/s).  The left median (across palm) sensory nerve showed no response (Palm), prolonged distal peak latency (5.3 ms), and reduced amplitude (8.9 V).  The right median (across palm) sensory nerve showed prolonged distal peak latency (Wrist, 5.3 ms) and prolonged distal peak latency (Palm, 4.0 ms).  All remaining nerves (as indicated in the following tables) were within normal limits.  All left vs. right side differences were within normal limits.    All examined muscles (as indicated in the following table) showed no evidence of electrical instability.    Impression: The above electrodiagnostic study is ABNORMAL and reveals evidence of a moderate bilateral median nerve entrapment at the wrist (carpal tunnel syndrome) affecting sensory and motor components.   There is no significant electrodiagnostic evidence of any other focal nerve entrapment, brachial plexopathy or cervical radiculopathy.   Recommendations: 1.  Follow-up with referring physician. 2.  Continue current management of symptoms. 3.  Suggest surgical evaluation.  ___________________________ Naaman Plummer FAAPMR Board Certified, American Board of Physical Medicine and Rehabilitation    Nerve Conduction Studies Anti Sensory Summary Table   Stim Site NR Peak (ms) Norm Peak (ms) P-T Amp (V) Norm P-T Amp Site1 Site2 Delta-P (ms) Dist (cm) Vel (m/s) Norm Vel (m/s)  Left Median Acr Palm Anti Sensory (2nd Digit)  31.2C  Wrist    *5.3 <3.6 *8.9 >10 Wrist Palm  0.0    Palm *NR  <2.0          Right Median Acr Palm Anti Sensory (2nd Digit)  30.8C  Wrist    *5.3 <3.6 21.4 >10 Wrist Palm 1.3 0.0    Palm    *4.0 <2.0 3.3         Right Radial Anti Sensory (Base 1st Digit)  30.7C  Wrist    2.2 <3.1 21.6  Wrist Base 1st Digit 2.2 0.0     Right Ulnar Anti Sensory (5th Digit)  31C  Wrist    2.8 <3.7 15.1 >15.0 Wrist 5th Digit 2.8 14.0 50 >38   Motor Summary Table   Stim Site NR Onset (ms) Norm Onset (ms) O-P Amp (mV) Norm O-P Amp Site1 Site2 Delta-0 (ms) Dist (cm) Vel (m/s) Norm Vel (m/s)  Left Median Motor (Abd Poll Brev)  31.2C  Wrist    *7.0 <4.2 8.9 >5 Elbow Wrist 5.3 24.5 *46 >50  Elbow    12.3  8.5         Right Median Motor (Abd Poll Brev)  30.7C  Wrist    *6.5 <4.2 8.9 >5 Elbow Wrist 5.4 25.5 *47 >50  Elbow    11.9  3.3         Right Ulnar Motor (Abd Dig Min)  30.7C  Wrist    3.1 <4.2 10.0 >3 B Elbow Wrist 4.5 24.0 53 >53  B Elbow    7.6  6.4  A Elbow B Elbow 2.0 11.0 55 >53  A Elbow    9.6  9.5          EMG   Side Muscle Nerve Root Ins Act Fibs Psw Amp Dur Poly Recrt Int Dennie Bible Comment  Right Abd Poll Brev Median C8-T1 Nml Nml Nml Nml Nml 0 Nml Nml   Right 1stDorInt Ulnar C8-T1 Nml Nml Nml Nml Nml 0 Nml Nml   Right PronatorTeres Median  C6-7 Nml Nml Nml Nml Nml 0 Nml Nml   Right Biceps Musculocut C5-6 Nml Nml Nml Nml Nml 0 Nml Nml   Right Deltoid Axillary C5-6 Nml Nml Nml Nml Nml 0 Nml Nml     Nerve Conduction Studies Anti Sensory Left/Right Comparison   Stim Site L Lat (ms) R Lat (ms) L-R Lat (ms) L Amp (V) R Amp (V) L-R Amp (%) Site1 Site2 L Vel (m/s) R Vel (m/s) L-R Vel (m/s)  Median Acr Palm Anti Sensory (2nd Digit)  31.2C  Wrist *5.3 *5.3 0.0 *8.9 21.4 58.4 Wrist Palm     Palm  *4.0   3.3        Radial Anti Sensory (Base 1st Digit)  30.7C  Wrist  2.2   21.6  Wrist Base 1st Digit     Ulnar Anti Sensory (5th Digit)  31C  Wrist  2.8   15.1  Wrist 5th Digit  50    Motor Left/Right Comparison   Stim Site L Lat (ms) R Lat (ms) L-R Lat (ms) L Amp (mV) R Amp (mV) L-R Amp (%) Site1 Site2 L Vel (m/s) R Vel (m/s) L-R Vel (m/s)  Median Motor (Abd Poll Brev)  31.2C  Wrist *7.0 *6.5 0.5 8.9 8.9 0.0 Elbow Wrist *46 *47 1  Elbow 12.3 11.9 0.4 8.5 3.3 61.2       Ulnar Motor (Abd Dig Min)  30.7C   Wrist  3.1   10.0  B Elbow Wrist  53   B Elbow  7.6   6.4  A Elbow B Elbow  55   A Elbow  9.6   9.5           Waveforms:

## 2021-05-23 NOTE — Progress Notes (Signed)
Nicolas [name] - 45 y.o. male MRN [id number]  Date of birth: 11-07-1975  Office Visit Note: Visit Date: 05/18/2021 PCP: [name], MD Referred by: Nicolas Beards, MD  Subjective: Chief Complaint  Patient presents with   Right Hand - Numbness, Pain, Weakness   Left Hand - Numbness, Weakness, Pain   HPI:  Nicolas [name] is a 45 y.o. male who comes in today at the request of Dr. Waylan Rocher for electrodiagnostic study of the Bilateral upper extremities.  Patient is Right hand dominant.  He reports about 8 months of worsening bilateral hand pain with numbness and tingling in both hands may be somewhat left more than right but equal.  He reports nocturnal complaints in particular.  He endorses a positive flick sign.  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  He has been wearing wrist splints.  He has had no prior electrodiagnostic studies.  He denies any frank radicular symptoms.  He is not diabetic.  ROS Otherwise per HPI.  Assessment & Plan: Visit Diagnoses:    ICD-10-CM   1. Paresthesia of skin  R20.2 NCV with EMG (electromyography)      Plan: Impression: The above electrodiagnostic study is ABNORMAL and reveals evidence of a moderate bilateral median nerve entrapment at the wrist (carpal tunnel syndrome) affecting sensory and motor components.   There is no significant electrodiagnostic evidence of any other focal nerve entrapment, brachial plexopathy or cervical radiculopathy.   Recommendations: 1.  Follow-up with referring physician. 2.  Continue current management of symptoms. 3.  Suggest surgical evaluation.  Meds & Orders: No orders of the defined types were placed in this encounter.   Orders Placed This Encounter  Procedures   NCV with EMG (electromyography)    Follow-up: Return in about 2 weeks (around 06/01/2021) for Waylan Rocher, MD.   Procedures: No procedures performed  EMG & NCV Findings: Evaluation of the left median motor  and the right median motor nerves showed prolonged distal onset latency (L7.0, R6.5 ms) and decreased conduction velocity (Elbow-Wrist, L46, R47 m/s).  The left median (across palm) sensory nerve showed no response (Palm), prolonged distal peak latency (5.3 ms), and reduced amplitude (8.9 V).  The right median (across palm) sensory nerve showed prolonged distal peak latency (Wrist, 5.3 ms) and prolonged distal peak latency (Palm, 4.0 ms).  All remaining nerves (as indicated in the following tables) were within normal limits.  All left vs. right side differences were within normal limits.    All examined muscles (as indicated in the following table) showed no evidence of electrical instability.    Impression: The above electrodiagnostic study is ABNORMAL and reveals evidence of a moderate bilateral median nerve entrapment at the wrist (carpal tunnel syndrome) affecting sensory and motor components.   There is no significant electrodiagnostic evidence of any other focal nerve entrapment, brachial plexopathy or cervical radiculopathy.   Recommendations: 1.  Follow-up with referring physician. 2.  Continue current management of symptoms. 3.  Suggest surgical evaluation.  ___________________________ Naaman Plummer FAAPMR Board Certified, American Board of Physical Medicine and Rehabilitation    Nerve Conduction Studies Anti Sensory Summary Table   Stim Site NR Peak (ms) Norm Peak (ms) P-T Amp (V) Norm P-T Amp Site1 Site2 Delta-P (ms) Dist (cm) Vel (m/s) Norm Vel (m/s)  Left Median Acr Palm Anti Sensory (2nd Digit)  31.2C  Wrist    *5.3 <3.6 *8.9 >10 Wrist Palm  0.0    Palm *NR  <2.0  Right Median Acr Palm Anti Sensory (2nd Digit)  30.8C  Wrist    *5.3 <3.6 21.4 >10 Wrist Palm 1.3 0.0    Palm    *4.0 <2.0 3.3         Right Radial Anti Sensory (Base 1st Digit)  30.7C  Wrist    2.2 <3.1 21.6  Wrist Base 1st Digit 2.2 0.0    Right Ulnar Anti Sensory (5th Digit)  31C  Wrist    2.8  <3.7 15.1 >15.0 Wrist 5th Digit 2.8 14.0 50 >38   Motor Summary Table   Stim Site NR Onset (ms) Norm Onset (ms) O-P Amp (mV) Norm O-P Amp Site1 Site2 Delta-0 (ms) Dist (cm) Vel (m/s) Norm Vel (m/s)  Left Median Motor (Abd Poll Brev)  31.2C  Wrist    *7.0 <4.2 8.9 >5 Elbow Wrist 5.3 24.5 *46 >50  Elbow    12.3  8.5         Right Median Motor (Abd Poll Brev)  30.7C  Wrist    *6.5 <4.2 8.9 >5 Elbow Wrist 5.4 25.5 *47 >50  Elbow    11.9  3.3         Right Ulnar Motor (Abd Dig Min)  30.7C  Wrist    3.1 <4.2 10.0 >3 B Elbow Wrist 4.5 24.0 53 >53  B Elbow    7.6  6.4  A Elbow B Elbow 2.0 11.0 55 >53  A Elbow    9.6  9.5          EMG   Side Muscle Nerve Root Ins Act Fibs Psw Amp Dur Poly Recrt Int Dennie Bible Comment  Right Abd Poll Brev Median C8-T1 Nml Nml Nml Nml Nml 0 Nml Nml   Right 1stDorInt Ulnar C8-T1 Nml Nml Nml Nml Nml 0 Nml Nml   Right PronatorTeres Median C6-7 Nml Nml Nml Nml Nml 0 Nml Nml   Right Biceps Musculocut C5-6 Nml Nml Nml Nml Nml 0 Nml Nml   Right Deltoid Axillary C5-6 Nml Nml Nml Nml Nml 0 Nml Nml     Nerve Conduction Studies Anti Sensory Left/Right Comparison   Stim Site L Lat (ms) R Lat (ms) L-R Lat (ms) L Amp (V) R Amp (V) L-R Amp (%) Site1 Site2 L Vel (m/s) R Vel (m/s) L-R Vel (m/s)  Median Acr Palm Anti Sensory (2nd Digit)  31.2C  Wrist *5.3 *5.3 0.0 *8.9 21.4 58.4 Wrist Palm     Palm  *4.0   3.3        Radial Anti Sensory (Base 1st Digit)  30.7C  Wrist  2.2   21.6  Wrist Base 1st Digit     Ulnar Anti Sensory (5th Digit)  31C  Wrist  2.8   15.1  Wrist 5th Digit  50    Motor Left/Right Comparison   Stim Site L Lat (ms) R Lat (ms) L-R Lat (ms) L Amp (mV) R Amp (mV) L-R Amp (%) Site1 Site2 L Vel (m/s) R Vel (m/s) L-R Vel (m/s)  Median Motor (Abd Poll Brev)  31.2C  Wrist *7.0 *6.5 0.5 8.9 8.9 0.0 Elbow Wrist *46 *47 1  Elbow 12.3 11.9 0.4 8.5 3.3 61.2       Ulnar Motor (Abd Dig Min)  30.7C  Wrist  3.1   10.0  B Elbow Wrist  53   B Elbow  7.6   6.4  A  Elbow B Elbow  55   A Elbow  9.6   9.5  Waveforms:                Clinical History: No specialty comments available.     Objective:  VS:  HT:    WT:   BMI:     BP:   HR: bpm  TEMP: ( )  RESP:  Physical Exam Musculoskeletal:        General: No tenderness.     Comments: Inspection reveals no atrophy of the bilateral APB or FDI or hand intrinsics. There is no swelling, color changes, allodynia or dystrophic changes. There is 5 out of 5 strength in the bilateral wrist extension, finger abduction and long finger flexion. There is intact sensation to light touch in all dermatomal and peripheral nerve distributions. There is a positive Phalen's test bilaterally. There is a negative Hoffmann's test bilaterally.  Skin:    General: Skin is warm and dry.     Findings: No erythema or rash.  Neurological:     General: No focal deficit present.     Mental Status: He is alert and oriented to person, place, and time.     Sensory: No sensory deficit.     Motor: No weakness or abnormal muscle tone.     Coordination: Coordination normal.     Gait: Gait normal.  Psychiatric:        Mood and Affect: Mood normal.        Behavior: Behavior normal.        Thought Content: Thought content normal.     Imaging: No results found.

## 2021-05-31 ENCOUNTER — Other Ambulatory Visit: Payer: Self-pay | Admitting: Family Medicine

## 2021-06-02 ENCOUNTER — Ambulatory Visit (INDEPENDENT_AMBULATORY_CARE_PROVIDER_SITE_OTHER): Payer: 59 | Admitting: Orthopedic Surgery

## 2021-06-02 ENCOUNTER — Other Ambulatory Visit: Payer: Self-pay

## 2021-06-02 ENCOUNTER — Encounter: Payer: Self-pay | Admitting: Orthopedic Surgery

## 2021-06-02 DIAGNOSIS — G5603 Carpal tunnel syndrome, bilateral upper limbs: Secondary | ICD-10-CM

## 2021-06-02 NOTE — Progress Notes (Signed)
Office Visit Note   Patient: Nicolas [name]           Date of Birth: 1975/12/05           MRN: [id number] Visit Date: 06/02/2021              Requested by: Kathyrn Drown, MD [place] [place],  [place] [address] PCP: Kathyrn Drown, MD   Assessment & Plan: Visit Diagnoses:  1. Bilateral carpal tunnel syndrome     Plan: We discussed the diagnosis, prognosis, and both conservative and operative treatment options for carpal tunnel syndrome.  After our discussion, the patient has elected to proceed with right carpal tunnel release.  We reviewed the benefits of surgery and the potential risks including, but not limited to, persistent symptoms, infection, damage to nearby nerves and blood vessels, delayed wound healing.    All patient concerns and questions were addressed.  A surgical date will be confirmed with the patient.    Follow-Up Instructions: No follow-ups on file.   Orders:  No orders of the defined types were placed in this encounter.  No orders of the defined types were placed in this encounter.     Procedures: No procedures performed   Clinical Data: No additional findings.   Subjective: Chief Complaint  Patient presents with   Right Hand - Follow-up   Left Hand - Follow-up    This is a 45 yo RHD M who presents with continued numbness and tingling in both hands.  He has occasional nocturnal symptoms.  His numbness comes and goes and is worse w/ certain activities.  Recent EMG/NCS suggested moderate bilateral CTS.  He thinks both sides are equally affected.    Review of Systems   Objective: Vital Signs: BP 138/85 (BP Location: Right Arm, Patient Position: Sitting, Cuff Size: Large)    Pulse 98    SpO2 96%   Physical Exam Constitutional:      Appearance: Normal appearance.  Cardiovascular:     Rate and Rhythm: Normal rate.     Pulses: Normal pulses.  Pulmonary:     Effort: Pulmonary effort is normal.  Skin:    General: Skin is  warm and dry.     Capillary Refill: Capillary refill takes less than 2 seconds.  Neurological:     Mental Status: He is alert.    Right Hand Exam   Tenderness  The patient is experiencing no tenderness.   Other  Erythema: absent Sensation: normal Pulse: present  Comments:  + Tinel and Phalen tests at wrist.  Negative CT compression.  Limited supination since childhood.    Left Hand Exam   Tenderness  The patient is experiencing no tenderness.   Other  Erythema: absent Sensation: normal Pulse: present  Comments:  + Tinel and Phalen tests at wrist.  Negative CT compression.  Limited supination since childhood.      Specialty Comments:  No specialty comments available.  Imaging: No results found.   PMFS History: Patient Active Problem List   Diagnosis Date Noted   Bilateral carpal tunnel syndrome 06/02/2021   Bilateral hand numbness 05/12/2021   Acute bacterial conjunctivitis of both eyes 10/21/2020   Allergic conjunctivitis of both eyes 10/21/2020   Flank pain 05/04/2020   Acute cystitis without hematuria 05/04/2020   Peptic ulcer disease 02/10/2014   Gastric ulcer 02/09/2014   Nausea & vomiting 02/09/2014   Nausea with vomiting 02/08/2014   Obesity 02/01/2014   Alcohol use 01/31/2014  Acute pancreatitis 01/30/2014   Musculoskeletal chest pain 01/19/2014   Essential hypertension 10/24/2012   Past Medical History:  Diagnosis Date   History of stomach ulcers    Hypertension    Kidney stone    bil , different times  passed stones   Migraines    Pancreatitis     Family History  Problem Relation Age of Onset   Cancer Mother        Breast   Colon cancer Neg Hx     Past Surgical History:  Procedure Laterality Date   BIOPSY N/A 02/11/2014   Procedure: BIOPSY;  Surgeon: Corbin Ade, MD;  Location: [facility];  Service: Endoscopy;  Laterality: N/A;   CARDIAC CATHETERIZATION  Aug 2015   normal EF, normal coronaries   CARDIAC CATHETERIZATION      ESOPHAGOGASTRODUODENOSCOPY (EGD) WITH PROPOFOL N/A 02/11/2014   Procedure: ESOPHAGOGASTRODUODENOSCOPY (EGD) WITH PROPOFOL;  Surgeon: Corbin Ade, MD;  Location: [facility];  Service: Endoscopy;  Laterality: N/A;   LEFT HEART CATHETERIZATION WITH CORONARY ANGIOGRAM N/A 01/18/2014   Procedure: LEFT HEART CATHETERIZATION WITH CORONARY ANGIOGRAM;  Surgeon: Lennette Bihari, MD;  Location: [facility] CATH LAB;  Service: Cardiovascular;  Laterality: N/A;   Social History   Occupational History   Not on file  Tobacco Use   Smoking status: Former    Packs/day: 2.00    Years: 10.00    Pack years: 20.00    Types: Cigarettes    Quit date: 12/05/2020    Years since quitting: 0.4   Smokeless tobacco: Never  Vaping Use   Vaping Use: Never used  Substance and Sexual Activity   Alcohol use: No   Drug use: No   Sexual activity: Yes    Birth control/protection: None

## 2021-06-27 ENCOUNTER — Other Ambulatory Visit: Payer: Self-pay

## 2021-06-27 ENCOUNTER — Encounter (HOSPITAL_BASED_OUTPATIENT_CLINIC_OR_DEPARTMENT_OTHER): Payer: Self-pay | Admitting: Orthopedic Surgery

## 2021-07-18 ENCOUNTER — Encounter: Payer: 59 | Admitting: Orthopedic Surgery

## 2021-07-27 ENCOUNTER — Encounter (HOSPITAL_BASED_OUTPATIENT_CLINIC_OR_DEPARTMENT_OTHER): Payer: Self-pay | Admitting: Orthopedic Surgery

## 2021-08-02 ENCOUNTER — Telehealth: Payer: Self-pay | Admitting: Orthopedic Surgery

## 2021-08-02 NOTE — Telephone Encounter (Signed)
Patients wife has some questions regarding his surgery tomorrow with Dr. Frazier Butt. Please advise!

## 2021-08-02 NOTE — Telephone Encounter (Signed)
I advised patient of facility address where surgery is being preformed at, and what time patient needs to be there tomorrow. All questions answered.

## 2021-08-03 ENCOUNTER — Encounter (HOSPITAL_BASED_OUTPATIENT_CLINIC_OR_DEPARTMENT_OTHER): Payer: Self-pay | Admitting: Orthopedic Surgery

## 2021-08-03 ENCOUNTER — Ambulatory Visit (HOSPITAL_BASED_OUTPATIENT_CLINIC_OR_DEPARTMENT_OTHER)
Admission: RE | Admit: 2021-08-03 | Discharge: 2021-08-03 | Disposition: A | Discharge status: Home / Self Care | Payer: 59 | Source: Ambulatory Visit | Attending: Orthopedic Surgery | Admitting: Orthopedic Surgery

## 2021-08-03 ENCOUNTER — Other Ambulatory Visit: Payer: Self-pay

## 2021-08-03 ENCOUNTER — Ambulatory Visit (HOSPITAL_BASED_OUTPATIENT_CLINIC_OR_DEPARTMENT_OTHER): Payer: 59 | Admitting: Certified Registered"

## 2021-08-03 ENCOUNTER — Encounter (HOSPITAL_BASED_OUTPATIENT_CLINIC_OR_DEPARTMENT_OTHER)
Admission: RE | Disposition: A | Discharge status: Home / Self Care | Payer: Self-pay | Source: Ambulatory Visit | Attending: Orthopedic Surgery

## 2021-08-03 DIAGNOSIS — G5601 Carpal tunnel syndrome, right upper limb: Secondary | ICD-10-CM | POA: Diagnosis not present

## 2021-08-03 DIAGNOSIS — K279 Peptic ulcer, site unspecified, unspecified as acute or chronic, without hemorrhage or perforation: Secondary | ICD-10-CM | POA: Diagnosis not present

## 2021-08-03 DIAGNOSIS — I10 Essential (primary) hypertension: Secondary | ICD-10-CM

## 2021-08-03 DIAGNOSIS — Z87891 Personal history of nicotine dependence: Secondary | ICD-10-CM | POA: Insufficient documentation

## 2021-08-03 HISTORY — PX: CARPAL TUNNEL RELEASE: SHX101

## 2021-08-03 SURGERY — CARPAL TUNNEL RELEASE
Anesthesia: Monitor Anesthesia Care | Site: Hand | Laterality: Right

## 2021-08-03 MED ORDER — PROPOFOL 500 MG/50ML IV EMUL
INTRAVENOUS | Status: AC
  Filled 2021-08-03: qty 50

## 2021-08-03 MED ORDER — FENTANYL CITRATE (PF) 100 MCG/2ML IJ SOLN
INTRAMUSCULAR | Status: AC
  Filled 2021-08-03: qty 2

## 2021-08-03 MED ORDER — MIDAZOLAM HCL 2 MG/2ML IJ SOLN
INTRAMUSCULAR | Status: DC | PRN
  Administered 2021-08-03: 2 mg via INTRAVENOUS

## 2021-08-03 MED ORDER — MIDAZOLAM HCL 2 MG/2ML IJ SOLN
INTRAMUSCULAR | Status: AC
  Filled 2021-08-03: qty 2

## 2021-08-03 MED ORDER — PROPOFOL 500 MG/50ML IV EMUL
INTRAVENOUS | Status: DC | PRN
  Administered 2021-08-03: 75 ug/kg/min via INTRAVENOUS
  Administered 2021-08-03: 100 ug/kg/min via INTRAVENOUS

## 2021-08-03 MED ORDER — CEFAZOLIN SODIUM-DEXTROSE 2-4 GM/100ML-% IV SOLN
INTRAVENOUS | Status: AC
  Filled 2021-08-03: qty 100

## 2021-08-03 MED ORDER — ONDANSETRON HCL 4 MG/2ML IJ SOLN
INTRAMUSCULAR | Status: DC | PRN
  Administered 2021-08-03: 4 mg via INTRAVENOUS

## 2021-08-03 MED ORDER — LIDOCAINE-EPINEPHRINE (PF) 1 %-1:200000 IJ SOLN
INTRAMUSCULAR | Status: DC | PRN
  Administered 2021-08-03: 10 mL

## 2021-08-03 MED ORDER — FENTANYL CITRATE (PF) 100 MCG/2ML IJ SOLN
25.0000 ug | INTRAMUSCULAR | Status: DC | PRN
  Administered 2021-08-03 (×2): 50 ug via INTRAVENOUS

## 2021-08-03 MED ORDER — LACTATED RINGERS IV SOLN
INTRAVENOUS | Status: DC
Start: 2021-08-03 — End: 2021-08-03

## 2021-08-03 MED ORDER — 0.9 % SODIUM CHLORIDE (POUR BTL) OPTIME
TOPICAL | Status: DC | PRN
  Administered 2021-08-03: 75 mL

## 2021-08-03 MED ORDER — LIDOCAINE HCL (PF) 1 % IJ SOLN
INTRAMUSCULAR | Status: AC
  Filled 2021-08-03: qty 30

## 2021-08-03 MED ORDER — PROPOFOL 10 MG/ML IV BOLUS
INTRAVENOUS | Status: DC | PRN
Start: 2021-08-03 — End: 2021-08-03
  Administered 2021-08-03: 30 mg via INTRAVENOUS
  Administered 2021-08-03: 20 mg via INTRAVENOUS

## 2021-08-03 MED ORDER — DEXMEDETOMIDINE (PRECEDEX) IN NS 20 MCG/5ML (4 MCG/ML) IV SYRINGE
PREFILLED_SYRINGE | INTRAVENOUS | Status: DC | PRN
  Administered 2021-08-03: 12 ug via INTRAVENOUS

## 2021-08-03 MED ORDER — BUPIVACAINE HCL (PF) 0.25 % IJ SOLN
INTRAMUSCULAR | Status: AC
  Filled 2021-08-03: qty 30

## 2021-08-03 MED ORDER — ONDANSETRON HCL 4 MG/2ML IJ SOLN
INTRAMUSCULAR | Status: AC
  Filled 2021-08-03: qty 2

## 2021-08-03 MED ORDER — FENTANYL CITRATE (PF) 100 MCG/2ML IJ SOLN
INTRAMUSCULAR | Status: DC | PRN
Start: 2021-08-03 — End: 2021-08-03
  Administered 2021-08-03 (×2): 50 ug via INTRAVENOUS

## 2021-08-03 SURGICAL SUPPLY — 46 items
APL PRP STRL LF DISP 70% ISPRP (MISCELLANEOUS) ×1
BLADE SURG 15 STRL LF DISP TIS (BLADE) ×1 IMPLANT
BLADE SURG 15 STRL SS (BLADE) ×2
BNDG CMPR 9X4 STRL LF SNTH (GAUZE/BANDAGES/DRESSINGS) ×1
BNDG ELASTIC 3X5.8 VLCR STR LF (GAUZE/BANDAGES/DRESSINGS) ×2 IMPLANT
BNDG ESMARK 4X9 LF (GAUZE/BANDAGES/DRESSINGS) ×2 IMPLANT
BNDG GAUZE ELAST 4 BULKY (GAUZE/BANDAGES/DRESSINGS) ×2 IMPLANT
BNDG PLASTER X FAST 3X3 WHT LF (CAST SUPPLIES) IMPLANT
BNDG PLSTR 9X3 FST ST WHT (CAST SUPPLIES)
CHLORAPREP W/TINT 26 (MISCELLANEOUS) ×2 IMPLANT
CORD BIPOLAR FORCEPS 12FT (ELECTRODE) ×2 IMPLANT
COVER BACK TABLE 60X90IN (DRAPES) ×2 IMPLANT
COVER MAYO STAND STRL (DRAPES) ×2 IMPLANT
CUFF TOURN SGL QUICK 18X4 (TOURNIQUET CUFF) IMPLANT
CUFF TOURN SGL QUICK 24 (TOURNIQUET CUFF)
CUFF TRNQT CYL 24X4X16.5-23 (TOURNIQUET CUFF) IMPLANT
DRAPE EXTREMITY T 121X128X90 (DISPOSABLE) ×2 IMPLANT
DRAPE SURG 17X23 STRL (DRAPES) ×1 IMPLANT
DRAPE U-SHAPE 47X51 STRL (DRAPES) ×1 IMPLANT
GAUZE 4X4 16PLY ~~LOC~~+RFID DBL (SPONGE) ×1 IMPLANT
GAUZE SPONGE 4X4 12PLY STRL (GAUZE/BANDAGES/DRESSINGS) ×2 IMPLANT
GAUZE XEROFORM 1X8 LF (GAUZE/BANDAGES/DRESSINGS) ×1 IMPLANT
GLOVE SURG ENC MOIS LTX SZ7 (GLOVE) ×2 IMPLANT
GLOVE SURG POLYISO LF SZ6.5 (GLOVE) ×1 IMPLANT
GLOVE SURG UNDER POLY LF SZ6.5 (GLOVE) ×1 IMPLANT
GLOVE SURG UNDER POLY LF SZ7 (GLOVE) ×3 IMPLANT
GOWN STRL REUS W/ TWL LRG LVL3 (GOWN DISPOSABLE) ×1 IMPLANT
GOWN STRL REUS W/TWL LRG LVL3 (GOWN DISPOSABLE) ×2
GOWN STRL REUS W/TWL XL LVL3 (GOWN DISPOSABLE) ×2 IMPLANT
NDL HYPO 25X1 1.5 SAFETY (NEEDLE) IMPLANT
NEEDLE HYPO 25X1 1.5 SAFETY (NEEDLE) ×2 IMPLANT
NS IRRIG 1000ML POUR BTL (IV SOLUTION) ×2 IMPLANT
PACK BASIN DAY SURGERY FS (CUSTOM PROCEDURE TRAY) ×2 IMPLANT
PAD CAST 3X4 CTTN HI CHSV (CAST SUPPLIES) ×1 IMPLANT
PADDING CAST COTTON 3X4 STRL (CAST SUPPLIES) ×2
SLEEVE SCD COMPRESS KNEE MED (STOCKING) IMPLANT
SUCTION FRAZIER HANDLE 10FR (MISCELLANEOUS)
SUCTION TUBE FRAZIER 10FR DISP (MISCELLANEOUS) IMPLANT
SUT ETHILON 4 0 PS 2 18 (SUTURE) ×2 IMPLANT
SUT MNCRL AB 3-0 PS2 18 (SUTURE) IMPLANT
SUT VICRYL 4-0 PS2 18IN ABS (SUTURE) IMPLANT
SYR BULB EAR ULCER 3OZ GRN STR (SYRINGE) ×2 IMPLANT
SYR CONTROL 10ML LL (SYRINGE) ×1 IMPLANT
TOWEL GREEN STERILE FF (TOWEL DISPOSABLE) ×4 IMPLANT
TUBE CONNECTING 20X1/4 (TUBING) IMPLANT
UNDERPAD 30X36 HEAVY ABSORB (UNDERPADS AND DIAPERS) ×2 IMPLANT

## 2021-08-03 NOTE — Op Note (Signed)
° °  Date of Surgery: 08/03/2021  INDICATIONS: Mr. Bolio is a 46 y.o.-year-old male with right carpal tunnel syndrome that was confirmed on electrodiagnostic testing and has failed conservative management.  Risks, benefits, and alternatives to surgery were again discussed with the patient wishing to proceed with surgery.  Informed consent was signed after our discussion.   PREOPERATIVE DIAGNOSIS:  Right carpal tunnel syndrome  POSTOPERATIVE DIAGNOSIS: Same.  PROCEDURE: 1. Right carpal tunnel release   SURGEON: Audria Nine, M.D.  ASSIST:   ANESTHESIA:  Local, MAC  IV FLUIDS AND URINE: See anesthesia.  ESTIMATED BLOOD LOSS: <5 mL.  IMPLANTS: * No implants in log *   DRAINS: None  COMPLICATIONS: None  DESCRIPTION OF PROCEDURE: The patient was met in the preoperative holding area where the surgical site was marked and the consent form was verified.  The patient was then taken to the operating room and transferred to the operating table.  All bony prominences were well padded.  A tourniquet was applied to the right forearm.  Monitored sedation was induced.   A formal time-out was performed to confirm that this was the correct patient, surgery, side, and site. The operative extremity was prepped and draped in the usual and sterile fashion.    Following timeout, the limb was exsanguinated and the tourniquet inflated to 250 mmHg.  A longitudinal incision was made in line with the radial border of the ring finger from distal to the wrist flexion crease to the intersection of Kaplan's cardinal line.  The skin and subcutaneous tissue was sharply divided.  The longitudinally running palmar fascia was incised.  The thenar musculature was bluntly swept off of the transverse carpal ligament.  The ligament was divided from proximal to distal until the fat surrounding the palmar arch was encountered. Retractors were then placed in the proximal aspect of the wound to visualize the distal  antebrachial fascia.  The fascia was sharply divided under direct visualization.   The wound was then thoroughly irrigated with sterile saline.  The tourniquet was deflated.  Hemostasis was achieved with direct pressure and bipolar electrocautery.  The wound was then closed with 4-0 nylon sutures in a horizontal mattress fashion. The wound was then dressed with xeroform, folded kerlix, and an ace wrap.  The patient was then reversed from anesthesia and transferred to the postoperative bed.  All counts were correct x 2 at the end of the procedure.  The patient was taken to the recovery unit in stable condition.    POSTOPERATIVE PLAN: He will be discharged to home with appropriate pain medication and discharge instructions. I will see him back in the office in 10-14 days for his first postop visit.   Audria Nine, MD 2:34 PM

## 2021-08-03 NOTE — H&P (Signed)
HPI: Nicolas [name] is a 46 y.o. male who presents with continued numbness and tingling in both hands.  He has occasional nocturnal symptoms.  Recent EMG/NCS suggested moderate bilateral CTS.  He thinks both sides are equally affected.  He has no new symptoms since he was last seen in the office.    Past Medical History:  Diagnosis Date   History of stomach ulcers    Hypertension    Kidney stone    bil , different times  passed stones   Migraines    Pancreatitis    Past Surgical History:  Procedure Laterality Date   BIOPSY N/A 02/11/2014   Procedure: BIOPSY;  Surgeon: Corbin Ade, MD;  Location: [facility];  Service: Endoscopy;  Laterality: N/A;   CARDIAC CATHETERIZATION  Aug 2015   normal EF, normal coronaries   CARDIAC CATHETERIZATION     ESOPHAGOGASTRODUODENOSCOPY (EGD) WITH PROPOFOL N/A 02/11/2014   Procedure: ESOPHAGOGASTRODUODENOSCOPY (EGD) WITH PROPOFOL;  Surgeon: Corbin Ade, MD;  Location: [facility];  Service: Endoscopy;  Laterality: N/A;   LEFT HEART CATHETERIZATION WITH CORONARY ANGIOGRAM N/A 01/18/2014   Procedure: LEFT HEART CATHETERIZATION WITH CORONARY ANGIOGRAM;  Surgeon: Lennette Bihari, MD;  Location: [facility] CATH LAB;  Service: Cardiovascular;  Laterality: N/A;   Social History   Socioeconomic History   Marital status: Married    Spouse name: Not on file   Number of children: Not on file   Years of education: Not on file   Highest education level: Not on file  Occupational History   Not on file  Tobacco Use   Smoking status: Former    Packs/day: 2.00    Years: 10.00    Pack years: 20.00    Types: Cigarettes    Quit date: 12/05/2020    Years since quitting: 0.6   Smokeless tobacco: Never  Vaping Use   Vaping Use: Never used  Substance and Sexual Activity   Alcohol use: No   Drug use: No   Sexual activity: Yes    Birth control/protection: None  Other Topics Concern   Not on file  Social History Narrative   Not on file   Social Determinants of  Health   Financial Resource Strain: Not on file  Food Insecurity: Not on file  Transportation Needs: Not on file  Physical Activity: Not on file  Stress: Not on file  Social Connections: Not on file   Family History  Problem Relation Age of Onset   Cancer Mother        Breast   Colon cancer Neg Hx    - negative except otherwise stated in the family history section No Known Allergies Prior to Admission medications   Medication Sig Start Date End Date Taking? Authorizing Provider  amLODipine (NORVASC) 5 MG tablet TAKE 1 TABLET BY MOUTH ONCE DAILY. 03/09/21  Yes Luking, Jonna Coup, MD  rosuvastatin (CRESTOR) 5 MG tablet TAKE 1 TABLET BY MOUTH ONCE A DAY. 06/01/21  Yes Luking, Jonna Coup, MD  clobetasol ointment (TEMOVATE) 0.05 % APPLY THIN AMOUNT TWICE DAILY TO AFFECTED AREA AS NEEDED.(DO NOT APPLY TO FACIAL AREA) 03/24/21   [name], MD  erythromycin ophthalmic ointment Place 1 application into both eyes at bedtime. 10/21/20   Novella Olive, NP  triamcinolone cream (KENALOG) 0.1 % APPLY TO AFFECTED AREAS TWICE DAILY. 02/04/21   [name], MD   No results found. - pertinent xrays, CT, MRI studies were reviewed and independently interpreted  Positive  ROS: All other systems have been reviewed and were otherwise negative with the exception of those mentioned in the HPI and as above.  Physical Exam: General: No acute distress, resting comfortably Cardiovascular: No pedal edema Respiratory: No cyanosis, no use of accessory musculature Skin: No lesions in the area of chief complaint Neurologic: Sensation intact distally Psychiatric: Patient is at baseline mood and affect  Right Hand: + Tinel at wrist SILT all fingers today Skin warm and dry, no lesions in surgical area Hand warm and well perfused    Assessment: 46 yo M w/ bilateral carpal tunnel syndrome.   Plan: OR today for right carpal tunnel release DC home home from PACU Discussed risks, benefits, and alternatives  to surgery Informed consent signed Remainder per discharge instructions  Marlyne Beards, M.D. [facility] 1:44 PM

## 2021-08-03 NOTE — Transfer of Care (Signed)
Immediate Anesthesia Transfer of Care Note  Patient: Nicolas [name]  Procedure(s) Performed: Right CARPAL TUNNEL RELEASE (Right: Hand)  Patient Location: PACU  Anesthesia Type:MAC  Level of Consciousness: awake, alert  and oriented  Airway & Oxygen Therapy: Patient Spontanous Breathing and Patient connected to face mask oxygen  Post-op Assessment: Report given to RN and Post -op Vital signs reviewed and stable  Post vital signs: Reviewed and stable  Last Vitals:  Vitals Value Taken Time  BP 139/80 08/03/21 1430  Temp    Pulse 85 08/03/21 1430  Resp 14 08/03/21 1430  SpO2 99 % 08/03/21 1430  Vitals shown include unvalidated device data.  Last Pain:  Vitals:   08/03/21 1246  TempSrc: Oral  PainSc: 0-No pain         Complications: No notable events documented.

## 2021-08-03 NOTE — Interval H&P Note (Signed)
History and Physical Interval Note:  08/03/2021 1:46 PM  Nicolas [name]  has presented today for surgery, with the diagnosis of Right Carpal Tunnel Syndrome.  The various methods of treatment have been discussed with the patient and family. After consideration of risks, benefits and other options for treatment, the patient has consented to  Procedure(s): Right CARPAL TUNNEL RELEASE (Right) as a surgical intervention.  The patient's history has been reviewed, patient examined, no change in status, stable for surgery.  I have reviewed the patient's chart and labs.  Questions were answered to the patient's satisfaction.      Dior Dominik

## 2021-08-03 NOTE — Discharge Instructions (Addendum)
Post Anesthesia Home Care Instructions  Activity: Get plenty of rest for the remainder of the day. A responsible individual must stay with you for 24 hours following the procedure.  For the next 24 hours, DO NOT: -Drive a car -Paediatric nurse -Drink alcoholic beverages -Take any medication unless instructed by your physician -Make any legal decisions or sign important papers.  Meals: Start with liquid foods such as gelatin or soup. Progress to regular foods as tolerated. Avoid greasy, spicy, heavy foods. If nausea and/or vomiting occur, drink only clear liquids until the nausea and/or vomiting subsides. Call your physician if vomiting continues.  Special Instructions/Symptoms: Your throat may feel dry or sore from the anesthesia or the breathing tube placed in your throat during surgery. If this causes discomfort, gargle with warm salt water. The discomfort should disappear within 24 hours.  If you had a scopolamine patch placed behind your ear for the management of post- operative nausea and/or vomiting:  1. The medication in the patch is effective for 72 hours, after which it should be removed.  Wrap patch in a tissue and discard in the trash. Wash hands thoroughly with soap and water. 2. You may remove the patch earlier than 72 hours if you experience unpleasant side effects which may include dry mouth, dizziness or visual disturbances. 3. Avoid touching the patch. Wash your hands with soap and water after contact with the patch.       Audria Nine, M.D. Hand Surgery  POST-OPERATIVE DISCHARGE INSTRUCTIONS   PRESCRIPTIONS: - You have been given a prescription to be taken as directed for post-operative pain control.  You may also take over the counter ibuprofen/aleve and tylenol for pain. Take this as directed on the packaging. Do not exceed 3000 mg tylenol/acetaminophen in 24 hours.  Ibuprofen 600-800 mg (3-4) tablets by mouth every 6 hours as needed for pain.    OR  Aleve 2 tablets by mouth every 12 hours (twice daily) as needed for pain.   AND/OR  Tylenol 1000 mg (2 tablets) every 8 hours as needed for pain.  - Please use your pain medication carefully, as refills are limited and you may not be provided with one.  As stated above, please use over the counter pain medicine - it will also be helpful with decreasing your swelling.    ANESTHESIA: -After your surgery, post-surgical discomfort or pain is likely. This discomfort can last several days to a few weeks. At certain times of the day your discomfort may be more intense.   Did you receive a nerve block?   - A nerve block can provide pain relief for one hour to two days after your surgery. As long as the nerve block is working, you will experience little or no sensation in the area the surgeon operated on.  - As the nerve block wears off, you will begin to experience pain or discomfort. It is very important that you begin taking your prescribed pain medication before the nerve block fully wears off. Treating your pain at the first sign of the block wearing off will ensure your pain is better controlled and more tolerable when full-sensation returns. Do not wait until the pain is intolerable, as the medicine will be less effective. It is better to treat pain in advance than to try and catch up.   General Anesthesia:  - If you did not receive a nerve block during your surgery, you will need to start taking your pain medication shortly after your surgery  and should continue to do so as prescribed by your surgeon.     ICE AND ELEVATION: - You may use ice for the first 48-72 hours, but it is not critical.   - Motion of your fingers is very important to decrease the swelling.  - Elevation, as much as possible for the next 48 hours, is critical for decreasing swelling as well as for pain relief. Elevation means when you are seated or lying down, you hand should be at or above your heart. When  walking, the hand needs to be at or above the level of your elbow.  - If the bandage gets too tight, it may need to be loosened. Please contact our office and we will instruct you in how to do this.    SURGICAL BANDAGES:  - Keep your dressing and/or splint clean and dry at all times.  You can remove your dressing 4 days from now and change with a dry dressing or Band-Aids as needed thereafter. - You may place a plastic bag over your bandage to shower, but be careful, do not get your bandages wet.  - After the bandages have been removed, it is OK to get the stitches wet in a shower or with hand washing. Do not soak or submerge the wound yet. Please do not use lotions or creams on the stitches.      HAND THERAPY:  - You may not need any. If you do, we will begin this at your follow up visit in the clinic.    ACTIVITY AND WORK: - You are encouraged to move any fingers which are not in the bandage.  - Light use of the fingers is allowed to assist the other hand with daily hygiene and eating, but strong gripping or lifting is often uncomfortable and should be avoided.  - You might miss a variable period of time from work and hopefully this issue has been discussed prior to surgery. You may not do any heavy work with your affected hand for about 2 weeks.    [facility] [address] [place],  [place]  [address] [phone]

## 2021-08-03 NOTE — Anesthesia Preprocedure Evaluation (Addendum)
Anesthesia Evaluation  Patient identified by MRN, date of birth, ID band Patient awake    Reviewed: Allergy & Precautions, NPO status , Patient's Chart, lab work & pertinent test results  Airway Mallampati: II  TM Distance: >3 FB Neck ROM: Full    Dental  (+) Missing, Dental Advisory Given,    Pulmonary neg pulmonary ROS, former smoker,    Pulmonary exam normal breath sounds clear to auscultation       Cardiovascular hypertension, Pt. on medications Normal cardiovascular exam Rhythm:Regular Rate:Normal     Neuro/Psych  Headaches, negative psych ROS   GI/Hepatic PUD, (+)     substance abuse  alcohol use,   Endo/Other  negative endocrine ROS  Renal/GU negative Renal ROS  negative genitourinary   Musculoskeletal negative musculoskeletal ROS (+)   Abdominal   Peds  Hematology negative hematology ROS (+)   Anesthesia Other Findings   Reproductive/Obstetrics                            Anesthesia Physical Anesthesia Plan  ASA: 2  Anesthesia Plan: MAC   Post-op Pain Management: Minimal or no pain anticipated   Induction: Intravenous  PONV Risk Score and Plan: 3 and Ondansetron, Dexamethasone and Midazolam  Airway Management Planned: Natural Airway  Additional Equipment:   Intra-op Plan:   Post-operative Plan:   Informed Consent: I have reviewed the patients History and Physical, chart, labs and discussed the procedure including the risks, benefits and alternatives for the proposed anesthesia with the patient or authorized representative who has indicated his/her understanding and acceptance.     Dental advisory given  Plan Discussed with: CRNA  Anesthesia Plan Comments:         Anesthesia Quick Evaluation

## 2021-08-03 NOTE — Brief Op Note (Signed)
08/03/2021  2:32 PM  PATIENT:  Nicolas [name]  46 y.o. male  PRE-OPERATIVE DIAGNOSIS:  Right Carpal Tunnel Syndrome  POST-OPERATIVE DIAGNOSIS:  Right Carpal Tunnel Syndrome  PROCEDURE:  Procedure(s): Right CARPAL TUNNEL RELEASE (Right)  SURGEON:  Surgeon(s) and Role:    * Sherilyn Cooter, MD - Primary  PHYSICIAN ASSISTANT:   ASSISTANTS: none   ANESTHESIA:   local and MAC  EBL:  2 mL   BLOOD ADMINISTERED:none  DRAINS: none   LOCAL MEDICATIONS USED:  BUPIVICAINE  and LIDOCAINE   SPECIMEN:  No Specimen  DISPOSITION OF SPECIMEN:  N/A  COUNTS:  YES  TOURNIQUET:   Total Tourniquet Time Documented: Forearm (Right) - 16 minutes Total: Forearm (Right) - 16 minutes   DICTATION: .Dragon Dictation  PLAN OF CARE: Discharge to home after PACU  PATIENT DISPOSITION:  PACU - hemodynamically stable.   Delay start of Pharmacological VTE agent (>24hrs) due to surgical blood loss or risk of bleeding: not applicable

## 2021-08-04 ENCOUNTER — Encounter (HOSPITAL_BASED_OUTPATIENT_CLINIC_OR_DEPARTMENT_OTHER): Payer: Self-pay | Admitting: Orthopedic Surgery

## 2021-08-04 NOTE — Anesthesia Postprocedure Evaluation (Signed)
Anesthesia Post Note  Patient: DEBORAH DONDERO  Procedure(s) Performed: Right CARPAL TUNNEL RELEASE (Right: Hand)     Patient location during evaluation: PACU Anesthesia Type: MAC Level of consciousness: awake and alert Pain management: pain level controlled Vital Signs Assessment: post-procedure vital signs reviewed and stable Respiratory status: spontaneous breathing, nonlabored ventilation, respiratory function stable and patient connected to nasal cannula oxygen Cardiovascular status: stable and blood pressure returned to baseline Postop Assessment: no apparent nausea or vomiting Anesthetic complications: no   No notable events documented.  Last Vitals:  Vitals:   08/03/21 1500 08/03/21 1519  BP:  (!) 149/84  Pulse: 84 86  Resp: 16 20  Temp:  36.8 C  SpO2: 94% 100%    Last Pain:  Vitals:   08/03/21 1519  TempSrc: Oral                 Durrel Mcnee L Teriyah Purington

## 2021-08-15 ENCOUNTER — Other Ambulatory Visit: Payer: Self-pay

## 2021-08-15 ENCOUNTER — Ambulatory Visit (INDEPENDENT_AMBULATORY_CARE_PROVIDER_SITE_OTHER): Payer: 59 | Admitting: Orthopedic Surgery

## 2021-08-15 DIAGNOSIS — G5601 Carpal tunnel syndrome, right upper limb: Secondary | ICD-10-CM

## 2021-08-15 NOTE — Progress Notes (Signed)
? ?Post-Op Visit Note ?  ?Patient: Nicolas [name]           ?Date of Birth: [birth year]           ?MRN: [id number] ?Visit Date: 08/15/2021 ?PCP: [name], MD ? ? ?Assessment & Plan: ? ?Chief Complaint:  ?Chief Complaint  ?Patient presents with  ? Right Hand - Routine Post Op  ?  Doing good, carried weedeater for 2 hours yesterday  ? ?Visit Diagnoses:  ?1. Carpal tunnel syndrome, right upper limb   ? ? ?Plan: Patient doing well postoperatively.  Numbness and paresthesias have resolved.  No nocturnal symptoms.  Incision clean, dry, and healing well.  Sutures removed.  Discussed scar massage.  He will call the office when he wants to schedule the contralateral hand. ? ?Follow-Up Instructions: No follow-ups on file.  ? ?Orders:  ?No orders of the defined types were placed in this encounter. ? ?No orders of the defined types were placed in this encounter. ? ? ?Imaging: ?No results found. ? ?PMFS History: ?Patient Active Problem List  ? Diagnosis Date Noted  ? Carpal tunnel syndrome, right upper limb   ? Bilateral carpal tunnel syndrome 06/02/2021  ? Bilateral hand numbness 05/12/2021  ? Acute bacterial conjunctivitis of both eyes 10/21/2020  ? Allergic conjunctivitis of both eyes 10/21/2020  ? Flank pain 05/04/2020  ? Acute cystitis without hematuria 05/04/2020  ? Peptic ulcer disease 02/10/2014  ? Gastric ulcer 02/09/2014  ? Nausea & vomiting 02/09/2014  ? Nausea with vomiting 02/08/2014  ? Obesity 02/01/2014  ? Alcohol use 01/31/2014  ? Acute pancreatitis 01/30/2014  ? Musculoskeletal chest pain 01/19/2014  ? Essential hypertension 10/24/2012  ? ?Past Medical History:  ?Diagnosis Date  ? History of stomach ulcers   ? Hypertension   ? Kidney stone   ? bil , different times  passed stones  ? Migraines   ? Pancreatitis   ?  ?Family History  ?Problem Relation Age of Onset  ? Cancer Mother   ?     Breast  ? Colon cancer Neg Hx   ?  ?Past Surgical History:  ?Procedure Laterality Date  ? BIOPSY N/A 02/11/2014  ?  Procedure: BIOPSY;  Surgeon: Corbin Ade, MD;  Location: [facility];  Service: Endoscopy;  Laterality: N/A;  ? CARDIAC CATHETERIZATION  Aug 2015  ? normal EF, normal coronaries  ? CARDIAC CATHETERIZATION    ? CARPAL TUNNEL RELEASE Right 08/03/2021  ? Procedure: Right CARPAL TUNNEL RELEASE;  Surgeon: Marlyne Beards, MD;  Location: [facility];  Service: Orthopedics;  Laterality: Right;  ? ESOPHAGOGASTRODUODENOSCOPY (EGD) WITH PROPOFOL N/A 02/11/2014  ? Procedure: ESOPHAGOGASTRODUODENOSCOPY (EGD) WITH PROPOFOL;  Surgeon: Corbin Ade, MD;  Location: [facility];  Service: Endoscopy;  Laterality: N/A;  ? LEFT HEART CATHETERIZATION WITH CORONARY ANGIOGRAM N/A 01/18/2014  ? Procedure: LEFT HEART CATHETERIZATION WITH CORONARY ANGIOGRAM;  Surgeon: Lennette Bihari, MD;  Location: [facility];  Service: Cardiovascular;  Laterality: N/A;  ? ?Social History  ? ?Occupational History  ? Not on file  ?Tobacco Use  ? Smoking status: Former  ?  Packs/day: 2.00  ?  Years: 10.00  ?  Pack years: 20.00  ?  Types: Cigarettes  ?  Quit date: 12/05/2020  ?  Years since quitting: 0.6  ? Smokeless tobacco: Never  ?Vaping Use  ? Vaping Use: Never used  ?Substance and Sexual Activity  ? Alcohol use: No  ? Drug use: No  ? Sexual activity:  Yes  ?  Birth control/protection: None  ? ? ? ?

## 2021-08-26 ENCOUNTER — Telehealth: Payer: Self-pay | Admitting: Orthopedic Surgery

## 2021-08-26 NOTE — Telephone Encounter (Signed)
Pt called and said he is ready to have surgery on his L hand now.  ? ?CB [phone] ?

## 2021-08-29 NOTE — Telephone Encounter (Signed)
I do not have a surgery sheet on this patient for his left hand. Please have doctor fill one out, and I will be glad to contact patient in regards to scheduling. ?

## 2021-10-27 ENCOUNTER — Other Ambulatory Visit: Payer: Self-pay | Admitting: Family Medicine

## 2021-10-28 ENCOUNTER — Other Ambulatory Visit: Payer: Self-pay

## 2021-10-28 ENCOUNTER — Encounter (HOSPITAL_BASED_OUTPATIENT_CLINIC_OR_DEPARTMENT_OTHER): Payer: Self-pay | Admitting: Orthopedic Surgery

## 2021-11-08 ENCOUNTER — Ambulatory Visit: Payer: 59 | Admitting: Family Medicine

## 2021-11-09 ENCOUNTER — Ambulatory Visit (HOSPITAL_BASED_OUTPATIENT_CLINIC_OR_DEPARTMENT_OTHER)
Admission: RE | Admit: 2021-11-09 | Discharge: 2021-11-09 | Disposition: A | Discharge status: Home / Self Care | Payer: 59 | Attending: Orthopedic Surgery | Admitting: Orthopedic Surgery

## 2021-11-09 ENCOUNTER — Other Ambulatory Visit: Payer: Self-pay

## 2021-11-09 ENCOUNTER — Ambulatory Visit (HOSPITAL_BASED_OUTPATIENT_CLINIC_OR_DEPARTMENT_OTHER): Payer: 59 | Admitting: Certified Registered"

## 2021-11-09 ENCOUNTER — Encounter (HOSPITAL_BASED_OUTPATIENT_CLINIC_OR_DEPARTMENT_OTHER)
Admission: RE | Disposition: A | Discharge status: Home / Self Care | Payer: Self-pay | Source: Home / Self Care | Attending: Orthopedic Surgery

## 2021-11-09 ENCOUNTER — Encounter (HOSPITAL_BASED_OUTPATIENT_CLINIC_OR_DEPARTMENT_OTHER): Payer: Self-pay | Admitting: Orthopedic Surgery

## 2021-11-09 ENCOUNTER — Telehealth: Payer: Self-pay

## 2021-11-09 DIAGNOSIS — E669 Obesity, unspecified: Secondary | ICD-10-CM | POA: Insufficient documentation

## 2021-11-09 DIAGNOSIS — Z87891 Personal history of nicotine dependence: Secondary | ICD-10-CM | POA: Diagnosis not present

## 2021-11-09 DIAGNOSIS — I10 Essential (primary) hypertension: Secondary | ICD-10-CM | POA: Diagnosis not present

## 2021-11-09 DIAGNOSIS — G5602 Carpal tunnel syndrome, left upper limb: Secondary | ICD-10-CM

## 2021-11-09 DIAGNOSIS — Z01818 Encounter for other preprocedural examination: Secondary | ICD-10-CM

## 2021-11-09 DIAGNOSIS — G5601 Carpal tunnel syndrome, right upper limb: Secondary | ICD-10-CM

## 2021-11-09 HISTORY — PX: CARPAL TUNNEL RELEASE: SHX101

## 2021-11-09 SURGERY — CARPAL TUNNEL RELEASE
Anesthesia: General | Site: Hand | Laterality: Left

## 2021-11-09 MED ORDER — ONDANSETRON HCL 4 MG/2ML IJ SOLN
INTRAMUSCULAR | Status: DC | PRN
  Administered 2021-11-09: 4 mg via INTRAVENOUS

## 2021-11-09 MED ORDER — FENTANYL CITRATE (PF) 100 MCG/2ML IJ SOLN
INTRAMUSCULAR | Status: AC
  Filled 2021-11-09: qty 2

## 2021-11-09 MED ORDER — FENTANYL CITRATE (PF) 100 MCG/2ML IJ SOLN: 25.0000 ug | INTRAMUSCULAR | Status: DC | PRN

## 2021-11-09 MED ORDER — OXYCODONE HCL 5 MG PO TABS
ORAL_TABLET | ORAL | Status: AC
  Filled 2021-11-09: qty 1

## 2021-11-09 MED ORDER — KETOROLAC TROMETHAMINE 30 MG/ML IJ SOLN
30.0000 mg | Freq: Once | INTRAMUSCULAR | Status: AC
  Administered 2021-11-09: 30 mg via INTRAVENOUS

## 2021-11-09 MED ORDER — FENTANYL CITRATE (PF) 100 MCG/2ML IJ SOLN
INTRAMUSCULAR | Status: DC | PRN
  Administered 2021-11-09 (×3): 25 ug via INTRAVENOUS
  Administered 2021-11-09: 50 ug via INTRAVENOUS
  Administered 2021-11-09: 25 ug via INTRAVENOUS

## 2021-11-09 MED ORDER — LIDOCAINE HCL (PF) 1 % IJ SOLN
INTRAMUSCULAR | Status: AC
  Filled 2021-11-09: qty 30

## 2021-11-09 MED ORDER — MIDAZOLAM HCL 2 MG/2ML IJ SOLN
INTRAMUSCULAR | Status: AC
  Filled 2021-11-09: qty 2

## 2021-11-09 MED ORDER — ACETAMINOPHEN 10 MG/ML IV SOLN: 1000.0000 mg | Freq: Once | INTRAVENOUS | Status: DC | PRN

## 2021-11-09 MED ORDER — AMISULPRIDE (ANTIEMETIC) 5 MG/2ML IV SOLN: 10.0000 mg | Freq: Once | INTRAVENOUS | Status: DC | PRN

## 2021-11-09 MED ORDER — 0.9 % SODIUM CHLORIDE (POUR BTL) OPTIME
TOPICAL | Status: DC | PRN
  Administered 2021-11-09: 75 mL

## 2021-11-09 MED ORDER — OXYCODONE HCL 5 MG/5ML PO SOLN: 5.0000 mg | Freq: Once | ORAL | Status: AC | PRN

## 2021-11-09 MED ORDER — OXYCODONE HCL 5 MG PO TABS
5.0000 mg | ORAL_TABLET | Freq: Once | ORAL | Status: AC | PRN
  Administered 2021-11-09: 5 mg via ORAL

## 2021-11-09 MED ORDER — LACTATED RINGERS IV SOLN: INTRAVENOUS | Status: DC

## 2021-11-09 MED ORDER — KETOROLAC TROMETHAMINE 30 MG/ML IJ SOLN
INTRAMUSCULAR | Status: AC
  Filled 2021-11-09: qty 1

## 2021-11-09 MED ORDER — DEXMEDETOMIDINE (PRECEDEX) IN NS 20 MCG/5ML (4 MCG/ML) IV SYRINGE
PREFILLED_SYRINGE | INTRAVENOUS | Status: DC | PRN
  Administered 2021-11-09: 8 ug via INTRAVENOUS

## 2021-11-09 MED ORDER — MIDAZOLAM HCL 5 MG/5ML IJ SOLN
INTRAMUSCULAR | Status: DC | PRN
  Administered 2021-11-09: 2 mg via INTRAVENOUS

## 2021-11-09 MED ORDER — OXYCODONE HCL 5 MG PO TABS: 5.0000 mg | ORAL_TABLET | Freq: Four times a day (QID) | ORAL | 0 refills | Status: AC | PRN

## 2021-11-09 MED ORDER — BUPIVACAINE HCL (PF) 0.25 % IJ SOLN
INTRAMUSCULAR | Status: AC
Start: 2021-11-09 — End: ?
  Filled 2021-11-09: qty 120

## 2021-11-09 MED ORDER — BUPIVACAINE HCL (PF) 0.25 % IJ SOLN
INTRAMUSCULAR | Status: DC | PRN
  Administered 2021-11-09: 10 mL

## 2021-11-09 SURGICAL SUPPLY — 43 items
APL PRP STRL LF DISP 70% ISPRP (MISCELLANEOUS) ×1
BLADE SURG 15 STRL LF DISP TIS (BLADE) ×1 IMPLANT
BLADE SURG 15 STRL SS (BLADE) ×4
BNDG CMPR 9X4 STRL LF SNTH (GAUZE/BANDAGES/DRESSINGS) ×1
BNDG ELASTIC 3X5.8 VLCR STR LF (GAUZE/BANDAGES/DRESSINGS) ×2 IMPLANT
BNDG ESMARK 4X9 LF (GAUZE/BANDAGES/DRESSINGS) ×2 IMPLANT
BNDG GAUZE ELAST 4 BULKY (GAUZE/BANDAGES/DRESSINGS) ×2 IMPLANT
BNDG PLASTER X FAST 3X3 WHT LF (CAST SUPPLIES) IMPLANT
BNDG PLSTR 9X3 FST ST WHT (CAST SUPPLIES)
CHLORAPREP W/TINT 26 (MISCELLANEOUS) ×2 IMPLANT
CORD BIPOLAR FORCEPS 12FT (ELECTRODE) ×2 IMPLANT
COVER BACK TABLE 60X90IN (DRAPES) ×2 IMPLANT
COVER MAYO STAND STRL (DRAPES) ×2 IMPLANT
CUFF TOURN SGL QUICK 18X4 (TOURNIQUET CUFF) IMPLANT
CUFF TOURN SGL QUICK 24 (TOURNIQUET CUFF)
CUFF TRNQT CYL 24X4X16.5-23 (TOURNIQUET CUFF) IMPLANT
DRAPE EENT ADH APERT 31X51 STR (DRAPES) ×1 IMPLANT
DRAPE EXTREMITY T 121X128X90 (DISPOSABLE) ×2 IMPLANT
DRAPE SURG 17X23 STRL (DRAPES) ×1 IMPLANT
GAUZE XEROFORM 1X8 LF (GAUZE/BANDAGES/DRESSINGS) ×1 IMPLANT
GLOVE BIO SURGEON STRL SZ7 (GLOVE) ×3 IMPLANT
GLOVE BIOGEL PI IND STRL 7.0 (GLOVE) ×1 IMPLANT
GLOVE BIOGEL PI INDICATOR 7.0 (GLOVE) ×3
GOWN STRL REUS W/ TWL LRG LVL3 (GOWN DISPOSABLE) ×1 IMPLANT
GOWN STRL REUS W/TWL LRG LVL3 (GOWN DISPOSABLE) ×2
GOWN STRL REUS W/TWL XL LVL3 (GOWN DISPOSABLE) ×2 IMPLANT
NDL HYPO 25X1 1.5 SAFETY (NEEDLE) IMPLANT
NEEDLE HYPO 25X1 1.5 SAFETY (NEEDLE) ×2 IMPLANT
NS IRRIG 1000ML POUR BTL (IV SOLUTION) ×2 IMPLANT
PACK BASIN DAY SURGERY FS (CUSTOM PROCEDURE TRAY) ×2 IMPLANT
PAD CAST 3X4 CTTN HI CHSV (CAST SUPPLIES) ×1 IMPLANT
PADDING CAST COTTON 3X4 STRL (CAST SUPPLIES) ×2
SLEEVE SCD COMPRESS KNEE MED (STOCKING) IMPLANT
SUCTION FRAZIER HANDLE 10FR (MISCELLANEOUS)
SUCTION TUBE FRAZIER 10FR DISP (MISCELLANEOUS) IMPLANT
SUT ETHILON 4 0 PS 2 18 (SUTURE) ×2 IMPLANT
SUT MNCRL AB 3-0 PS2 18 (SUTURE) IMPLANT
SUT VICRYL 4-0 PS2 18IN ABS (SUTURE) IMPLANT
SYR BULB EAR ULCER 3OZ GRN STR (SYRINGE) ×2 IMPLANT
SYR CONTROL 10ML LL (SYRINGE) ×1 IMPLANT
TOWEL GREEN STERILE FF (TOWEL DISPOSABLE) ×3 IMPLANT
TUBE CONNECTING 20X1/4 (TUBING) IMPLANT
UNDERPAD 30X36 HEAVY ABSORB (UNDERPADS AND DIAPERS) ×2 IMPLANT

## 2021-11-09 NOTE — Addendum Note (Signed)
Addendum  created 11/09/21 1356 by Tristram Milian, Jewel Baize, CRNA   Intraprocedure Meds edited

## 2021-11-09 NOTE — Transfer of Care (Signed)
Immediate Anesthesia Transfer of Care Note  Patient: Nicolas [name]  Procedure(s) Performed: LEFT CARPAL TUNNEL RELEASE (Left: Hand)  Patient Location: PACU  Anesthesia Type:General  Level of Consciousness: awake  Airway & Oxygen Therapy: Patient Spontanous Breathing and Patient connected to face mask oxygen  Post-op Assessment: Report given to RN and Post -op Vital signs reviewed and stable  Post vital signs: Reviewed and stable  Last Vitals:  Vitals Value Taken Time  BP    Temp    Pulse 99 11/09/21 1108  Resp 12 11/09/21 1108  SpO2 94 % 11/09/21 1108  Vitals shown include unvalidated device data.  Last Pain:  Vitals:   11/09/21 0922  TempSrc: Oral  PainSc: 0-No pain      Patients Stated Pain Goal: 3 ([id number])  Complications: No notable events documented.

## 2021-11-09 NOTE — Discharge Instructions (Addendum)
Nicolas [name], M.D. Hand Surgery  POST-OPERATIVE DISCHARGE INSTRUCTIONS   PRESCRIPTIONS: - You have been given a prescription to be taken as directed for post-operative pain control.  You may also take over the counter ibuprofen/aleve and tylenol for pain. Take this as directed on the packaging. Do not exceed 3000 mg tylenol/acetaminophen in 24 hours.  Ibuprofen 600-800 mg (3-4) tablets by mouth every 6 hours as needed for pain.  *Next dose of Ibuprofen at 4:30 pm  OR  Aleve 2 tablets by mouth every 12 hours (twice daily) as needed for pain.   AND/OR  Tylenol 1000 mg (2 tablets) every 8 hours as needed for pain.  - Please use your pain medication carefully, as refills are limited and you may not be provided with one.  As stated above, please use over the counter pain medicine - it will also be helpful with decreasing your swelling.    ANESTHESIA: -After your surgery, post-surgical discomfort or pain is likely. This discomfort can last several days to a few weeks. At certain times of the day your discomfort may be more intense.   Did you receive a nerve block?   - A nerve block can provide pain relief for one hour to two days after your surgery. As long as the nerve block is working, you will experience little or no sensation in the area the surgeon operated on.  - As the nerve block wears off, you will begin to experience pain or discomfort. It is very important that you begin taking your prescribed pain medication before the nerve block fully wears off. Treating your pain at the first sign of the block wearing off will ensure your pain is better controlled and more tolerable when full-sensation returns. Do not wait until the pain is intolerable, as the medicine will be less effective. It is better to treat pain in advance than to try and catch up.   General Anesthesia:  If you did not receive a nerve block during your surgery, you will need to start taking your pain medication  shortly after your surgery and should continue to do so as prescribed by your surgeon.     ICE AND ELEVATION: - You may use ice for the first 48-72 hours, but it is not critical.   - Motion of your fingers is very important to decrease the swelling.  - Elevation, as much as possible for the next 48 hours, is critical for decreasing swelling as well as for pain relief. Elevation means when you are seated or lying down, you hand should be at or above your heart. When walking, the hand needs to be at or above the level of your elbow.  - If the bandage gets too tight, it may need to be loosened. Please contact our office and we will instruct you in how to do this.    SURGICAL BANDAGES:  - Keep your dressing and/or splint clean and dry at all times.  You can remove your dressing 4 days from now and change with a dry dressing or Band-Aids as needed thereafter. - You may place a plastic bag over your bandage to shower, but be careful, do not get your bandages wet.  - After the bandages have been removed, it is OK to get the stitches wet in a shower or with hand washing. Do Not soak or submerge the wound yet. Please do not use lotions or creams on the stitches.      HAND THERAPY:  - You  may not need any. If you do, we will begin this at your follow up visit in the clinic.    ACTIVITY AND WORK: - You are encouraged to move any fingers which are not in the bandage.  - Light use of the fingers is allowed to assist the other hand with daily hygiene and eating, but strong gripping or lifting is often uncomfortable and should be avoided.  - You might miss a variable period of time from work and hopefully this issue has been discussed prior to surgery. You may not do any heavy work with your affected hand for about 2 weeks.    [facility] [address] [place],  [place]  [address] [phone]    Post Anesthesia Home Care Instructions  Activity: Get plenty of rest for the  remainder of the day. A responsible individual must stay with you for 24 hours following the procedure.  For the next 24 hours, DO NOT: -Drive a car -Advertising copywriter -Drink alcoholic beverages -Take any medication unless instructed by your physician -Make any legal decisions or sign important papers.  Meals: Start with liquid foods such as gelatin or soup. Progress to regular foods as tolerated. Avoid greasy, spicy, heavy foods. If nausea and/or vomiting occur, drink only clear liquids until the nausea and/or vomiting subsides. Call your physician if vomiting continues.  Special Instructions/Symptoms: Your throat may feel dry or sore from the anesthesia or the breathing tube placed in your throat during surgery. If this causes discomfort, gargle with warm salt water. The discomfort should disappear within 24 hours.  If you had a scopolamine patch placed behind your ear for the management of post- operative nausea and/or vomiting:  1. The medication in the patch is effective for 72 hours, after which it should be removed.  Wrap patch in a tissue and discard in the trash. Wash hands thoroughly with soap and water. 2. You may remove the patch earlier than 72 hours if you experience unpleasant side effects which may include dry mouth, dizziness or visual disturbances. 3. Avoid touching the patch. Wash your hands with soap and water after contact with the patch.

## 2021-11-09 NOTE — Anesthesia Postprocedure Evaluation (Signed)
Anesthesia Post Note  Patient: Nicolas [name]  Procedure(s) Performed: LEFT CARPAL TUNNEL RELEASE (Left: Hand)     Patient location during evaluation: PACU Anesthesia Type: General Level of consciousness: awake Pain management: pain level controlled Vital Signs Assessment: post-procedure vital signs reviewed and stable Respiratory status: spontaneous breathing, nonlabored ventilation, respiratory function stable and patient connected to nasal cannula oxygen Cardiovascular status: blood pressure returned to baseline and stable Postop Assessment: no apparent nausea or vomiting Anesthetic complications: no   No notable events documented.  Last Vitals:  Vitals:   11/09/21 1130 11/09/21 1146  BP: (!) 163/84 (!) 160/92  Pulse: 89 87  Resp: 18 18  Temp:  36.9 C  SpO2: 93% 93%    Last Pain:  Vitals:   11/09/21 1146  TempSrc: Oral  PainSc: 0-No pain                 Anjelita Sheahan P Paxtyn Wisdom

## 2021-11-09 NOTE — Interval H&P Note (Signed)
History and Physical Interval Note:  11/09/2021 9:47 AM  Nicolas [name]  has presented today for surgery, with the diagnosis of CARPAL TUNNEL SYNDROME.  The various methods of treatment have been discussed with the patient and family. After consideration of risks, benefits and other options for treatment, the patient has consented to  Procedure(s): LEFT CARPAL TUNNEL RELEASE (Left) as a surgical intervention.  The patient's history has been reviewed, patient examined, no change in status, stable for surgery.  I have reviewed the patient's chart and labs.  Questions were answered to the patient's satisfaction.      Chung Chagoya

## 2021-11-09 NOTE — Op Note (Signed)
   Date of Surgery: 11/09/2021  INDICATIONS: Patient is a 46 y.o.-year-old male with left carpal tunnel syndrome that was confirmed on electrodiagnostic testing and has failed conservative management.  Risks, benefits, and alternatives to surgery were again discussed with the patient in the preoperative area. The patient wishes to proceed with surgery.  Informed consent was signed after our discussion.   PREOPERATIVE DIAGNOSIS: 1.  Left carpal tunnel syndrome  POSTOPERATIVE DIAGNOSIS: Same.  PROCEDURE: 1.  Left carpal tunnel release   SURGEON: Audria Nine, M.D.  ASSIST:   ANESTHESIA:  general  IV FLUIDS AND URINE: See anesthesia.  ESTIMATED BLOOD LOSS: Less than 5 mL.  IMPLANTS: * No implants in log *   DRAINS: None  COMPLICATIONS: None  DESCRIPTION OF PROCEDURE: The patient was met in the preoperative holding area where the surgical site was marked and the consent form was verified.  The patient was then taken to the operating room and transferred to the operating table.  All bony prominences were well padded.  A tourniquet was applied to the left forearm.  General endotracheal anesthesia was induced.   A formal time-out was performed to confirm that this was the correct patient, surgery, side, and site. The operative extremity was prepped and draped in the usual and sterile fashion.    Following a second timeout, the limb was exsanguinated and the tourniquet inflated to 250 mmHg.  A longitudinal incision was made in line with the radial border of the ring finger from distal to the wrist flexion crease to the intersection of Kaplan's cardinal line.  The skin and subcutaneous tissue was sharply divided.  The longitudinally running palmar fascia was incised.  The thenar musculature was bluntly swept off of the transverse carpal ligament.  The ligament was divided from proximal to distal until the fat surrounding the palmar arch was encountered. Retractors were then placed in the  proximal aspect of the wound to visualize the distal antebrachial fascia.  The fascia was sharply divided under direct visualization.   The wound was then thoroughly irrigated with sterile saline.  The tourniquet was deflated.  Hemostasis was achieved with direct pressure and bipolar electrocautery.  The wound was then closed with 4-0 nylon sutures in a horizontal mattress fashion. The wound was then dressed with xeroform, folded kerlix, and an ace wrap.  The patient was then reversed from anesthesia and transferred to the postoperative bed.  All counts were correct x 2 at the end of the procedure.  The patient was taken to the recovery unit in stable condition.      POSTOPERATIVE PLAN: He will be discharged home with appropriate pain medication and discharge instructions.  I will see him back in the office in 10 days for his first postop visit.  Audria Nine, MD 11:02 AM

## 2021-11-09 NOTE — Anesthesia Preprocedure Evaluation (Addendum)
Anesthesia Evaluation  Patient identified by MRN, date of birth, ID band Patient awake    Reviewed: Allergy & Precautions, NPO status , Patient's Chart, lab work & pertinent test results  Airway Mallampati: III  TM Distance: >3 FB Neck ROM: Full    Dental  (+) Missing,    Pulmonary former smoker,    Pulmonary exam normal        Cardiovascular hypertension, Pt. on medications Normal cardiovascular exam     Neuro/Psych  Headaches,  Neuromuscular disease negative psych ROS   GI/Hepatic PUD, (+)     substance abuse  ,   Endo/Other  negative endocrine ROS  Renal/GU Renal disease     Musculoskeletal negative musculoskeletal ROS (+)   Abdominal (+) + obese,   Peds  Hematology negative hematology ROS (+)   Anesthesia Other Findings CARPAL TUNNEL SYNDROME  Reproductive/Obstetrics                           Anesthesia Physical Anesthesia Plan  ASA: 2  Anesthesia Plan: General   Post-op Pain Management:    Induction: Intravenous  PONV Risk Score and Plan: 2 and Ondansetron and Dexamethasone  Airway Management Planned: LMA  Additional Equipment:   Intra-op Plan:   Post-operative Plan: Extubation in OR  Informed Consent: I have reviewed the patients History and Physical, chart, labs and discussed the procedure including the risks, benefits and alternatives for the proposed anesthesia with the patient or authorized representative who has indicated his/her understanding and acceptance.     Dental advisory given  Plan Discussed with: CRNA  Anesthesia Plan Comments:        Anesthesia Quick Evaluation

## 2021-11-09 NOTE — H&P (Signed)
HAND SURGERY  HPI: Nicolas [name] is a 46 y.o. male who presents with left carpal tunnel syndrome that was confirmed on electrodiagnostic testing and has failed conservative management.  He previously underwent right carpal tunnel release with significant symptom relief on this side.  He continues to complain of both daytime and nocturnal symptoms on the left side. He presents today for left carpal tunnel release.    Past Medical History:  Diagnosis Date   History of stomach ulcers    Hypertension    Kidney stone    bil , different times  passed stones   Migraines    Pancreatitis    Past Surgical History:  Procedure Laterality Date   BIOPSY N/A 02/11/2014   Procedure: BIOPSY;  Surgeon: Corbin Ade, MD;  Location: [facility];  Service: Endoscopy;  Laterality: N/A;   CARDIAC CATHETERIZATION  Aug 2015   normal EF, normal coronaries   CARDIAC CATHETERIZATION     CARPAL TUNNEL RELEASE Right 08/03/2021   Procedure: Right CARPAL TUNNEL RELEASE;  Surgeon: Marlyne Beards, MD;  Location: [facility];  Service: Orthopedics;  Laterality: Right;   ESOPHAGOGASTRODUODENOSCOPY (EGD) WITH PROPOFOL N/A 02/11/2014   Procedure: ESOPHAGOGASTRODUODENOSCOPY (EGD) WITH PROPOFOL;  Surgeon: Corbin Ade, MD;  Location: [facility];  Service: Endoscopy;  Laterality: N/A;   LEFT HEART CATHETERIZATION WITH CORONARY ANGIOGRAM N/A 01/18/2014   Procedure: LEFT HEART CATHETERIZATION WITH CORONARY ANGIOGRAM;  Surgeon: Lennette Bihari, MD;  Location: [facility] CATH LAB;  Service: Cardiovascular;  Laterality: N/A;   Social History   Socioeconomic History   Marital status: Married    Spouse name: Not on file   Number of children: Not on file   Years of education: Not on file   Highest education level: Not on file  Occupational History   Not on file  Tobacco Use   Smoking status: Former    Packs/day: 2.00    Years: 10.00    Pack years: 20.00    Types: Cigarettes    Quit date: 12/05/2020    Years since  quitting: 0.9   Smokeless tobacco: Never  Vaping Use   Vaping Use: Never used  Substance and Sexual Activity   Alcohol use: No   Drug use: No   Sexual activity: Yes    Birth control/protection: None  Other Topics Concern   Not on file  Social History Narrative   Not on file   Social Determinants of Health   Financial Resource Strain: Not on file  Food Insecurity: Not on file  Transportation Needs: Not on file  Physical Activity: Not on file  Stress: Not on file  Social Connections: Not on file   Family History  Problem Relation Age of Onset   Cancer Mother        Breast   Colon cancer Neg Hx    - negative except otherwise stated in the family history section No Known Allergies Prior to Admission medications   Medication Sig Start Date End Date Taking? Authorizing Provider  amLODipine (NORVASC) 5 MG tablet TAKE 1 TABLET BY MOUTH ONCE DAILY. 03/09/21  Yes Luking, Scott A, MD  rosuvastatin (CRESTOR) 5 MG tablet TAKE 1 TABLET BY MOUTH ONCE A DAY. 10/28/21  Yes Luking, Scott A, MD  clobetasol ointment (TEMOVATE) 0.05 % APPLY THIN AMOUNT TWICE DAILY TO AFFECTED AREA AS NEEDED.(DO NOT APPLY TO FACIAL AREA) 03/24/21   [name], MD  erythromycin ophthalmic ointment Place 1 application into both eyes at bedtime. 10/21/20  Novella Olive, NP  triamcinolone cream (KENALOG) 0.1 % APPLY TO AFFECTED AREAS TWICE DAILY. 02/04/21   [name], MD   No results found. - pertinent xrays, CT, MRI studies were reviewed and independently interpreted  Positive ROS: All other systems have been reviewed and were otherwise negative with the exception of those mentioned in the HPI and as above.  Physical Exam: General: No acute distress, resting comfortably Cardiovascular: BUE warm and well perfused Respiratory: Normal WOB on RA Skin: No lesions in the area of chief complaint Neurologic: Sensation intact distally Psychiatric: Patient is at baseline mood and affect  Left  Hand: Positive Tinel and Phalen signs Limited forearm supination at baseline since childhood Skin warm and dry w/ no lesions   Assessment: 46 yo M w/ L CTS that has failed conservative management.  Plan: OR today for L CTR We again reviewed the risks of surgery which include, but are no limited to, bleeding, infection, damage to the median nerve and it's branches, delayed wound healing, incomplete symptom relief, need for additional procedures. Informed consent was signed  Marlyne Beards, M.D. [facility] 9:43 AM

## 2021-11-09 NOTE — Telephone Encounter (Signed)
Contacted patient with Dr. Frazier Butt instructions. Patient states that he understands and has no further questions or concerns.

## 2021-11-09 NOTE — Telephone Encounter (Signed)
Please advise 

## 2021-11-09 NOTE — Anesthesia Procedure Notes (Signed)
Procedure Name: LMA Insertion Date/Time: 11/09/2021 10:30 AM Performed by: Sheryn Bison, CRNA Pre-anesthesia Checklist: Patient identified, Emergency Drugs available, Suction available and Patient being monitored Patient Re-evaluated:Patient Re-evaluated prior to induction Oxygen Delivery Method: Circle System Utilized Preoxygenation: Pre-oxygenation with 100% oxygen Induction Type: IV induction Ventilation: Mask ventilation without difficulty LMA: LMA inserted LMA Size: 4.0 Number of attempts: 1 Airway Equipment and Method: bite block Placement Confirmation: positive ETCO2 Tube secured with: Tape Dental Injury: Teeth and Oropharynx as per pre-operative assessment

## 2021-11-09 NOTE — Telephone Encounter (Signed)
Patient called Triage line. States he had L CTR SU today & did not get instructions. Would like to know if he can take off bandage? Can he get incision wet?    CB [phone]

## 2021-11-16 ENCOUNTER — Telehealth: Payer: Self-pay

## 2021-11-16 ENCOUNTER — Emergency Department (HOSPITAL_COMMUNITY)
Admission: EM | Admit: 2021-11-16 | Discharge: 2021-11-16 | Disposition: A | Discharge status: Home / Self Care | Payer: 59 | Attending: Emergency Medicine | Admitting: Emergency Medicine

## 2021-11-16 ENCOUNTER — Other Ambulatory Visit: Payer: Self-pay | Admitting: *Deleted

## 2021-11-16 ENCOUNTER — Encounter (HOSPITAL_COMMUNITY): Payer: Self-pay | Admitting: Emergency Medicine

## 2021-11-16 DIAGNOSIS — Z0000 Encounter for general adult medical examination without abnormal findings: Secondary | ICD-10-CM

## 2021-11-16 DIAGNOSIS — R220 Localized swelling, mass and lump, head: Secondary | ICD-10-CM | POA: Diagnosis present

## 2021-11-16 DIAGNOSIS — I10 Essential (primary) hypertension: Secondary | ICD-10-CM

## 2021-11-16 DIAGNOSIS — R7301 Impaired fasting glucose: Secondary | ICD-10-CM

## 2021-11-16 DIAGNOSIS — Z79899 Other long term (current) drug therapy: Secondary | ICD-10-CM | POA: Diagnosis not present

## 2021-11-16 DIAGNOSIS — T7840XA Allergy, unspecified, initial encounter: Secondary | ICD-10-CM | POA: Diagnosis not present

## 2021-11-16 DIAGNOSIS — E785 Hyperlipidemia, unspecified: Secondary | ICD-10-CM

## 2021-11-16 MED ORDER — PREDNISONE 10 MG PO TABS
60.0000 mg | ORAL_TABLET | Freq: Once | ORAL | Status: AC
Start: 2021-11-16 — End: 2021-11-16
  Administered 2021-11-16: 60 mg via ORAL
  Filled 2021-11-16: qty 1

## 2021-11-16 MED ORDER — DIPHENHYDRAMINE HCL 25 MG PO CAPS
25.0000 mg | ORAL_CAPSULE | Freq: Once | ORAL | Status: AC
  Administered 2021-11-16: 25 mg via ORAL
  Filled 2021-11-16: qty 1

## 2021-11-16 MED ORDER — PREDNISONE 50 MG PO TABS: 50.0000 mg | ORAL_TABLET | Freq: Every day | ORAL | 0 refills | Status: DC

## 2021-11-16 MED ORDER — ONDANSETRON HCL 8 MG PO TABS: ORAL_TABLET | ORAL | 0 refills | Status: DC

## 2021-11-16 NOTE — ED Triage Notes (Signed)
Pt states he has been having facial swelling since he had surgery on his wrist last week. Pt states the swelling will go away during the day but it continues to return.

## 2021-11-16 NOTE — Telephone Encounter (Signed)
Lipid, liver, metabolic 7, A1c Diagnosis hypertension hyperlipidemia fasting hyperglycemia, wellness  Patient should do these labs before his visit May go ahead and schedule wellness If any additional questions let me know thank you

## 2021-11-16 NOTE — Telephone Encounter (Signed)
Patient informed of md message and recommendations. Verbalized understanding. 

## 2021-11-16 NOTE — Telephone Encounter (Signed)
Patient's wife called and states that pt is feeling nauseated and his face is still swollen. He went to the ER for the facial swelling.  He hasn't vomited, just feeling nauseous. Please advise. Thank you

## 2021-11-16 NOTE — Telephone Encounter (Signed)
Labs ordered in epic

## 2021-11-16 NOTE — Telephone Encounter (Signed)
So we are somewhat limited in being able to know for certain what is causing this by basing it just on ER notes I reviewed over the ER notes Given some swelling around the eyes in hives on the back ER doctor is treating with prednisone and antihistamines which seems reasonable If he continues to have these type of issues I recommend a follow-up visit In some cases we will have to do referral and consultation with allergist It would also be beneficial for patient to take a medication like Zyrtec on a every single day basis We are more than happy to do a follow-up visit at his discretion

## 2021-11-16 NOTE — Telephone Encounter (Signed)
Caller name:Skeeter Kaufman   On DPR? :Yes   Call back number:[phone]   Provider they see: Luking   Reason for call:Pt needs Dr Lorin Picket to look at paperwork regarding ER visit this morning pt face was swelling and he does not believe that it is allergies

## 2021-11-16 NOTE — Telephone Encounter (Signed)
Zofran 8 mg one tid prn nausea #15 If ongoing issues FU If concerned about facila swelling FU Thursday or go back to ED if emergency

## 2021-11-16 NOTE — Telephone Encounter (Signed)
Patient informed of md message and recommendations. Verbalized understanding.  Pt states he needs to get a physical and blood work done soon.  His last labs were HIV antibody,hep C, lipid and CMET done on 12/09/20. Please advise. Thank you

## 2021-11-16 NOTE — ED Provider Notes (Signed)
[facility] EMERGENCY DEPARTMENT Provider Note   CSN: [id number] Arrival date & time: 11/16/21  0454     History  Chief Complaint  Patient presents with   Facial Swelling    Nicolas [name] is a 46 y.o. male.  The history is provided by the patient.  He has history of hypertension, pancreatitis, carpal tunnel syndrome status post bilateral release with most recent surgery on 5/31.  For the last week, he has been waking up at night with his face swollen and some hives on his back.  The face is not itchy but the back is.  He denies any difficulty breathing or swallowing.  Tonight his eyes were almost swollen shut so he decided to come in.  He denies any new medications and denies any new exposures.  He has never had anything like this before.  He has not taken anything at home for it.   Home Medications Prior to Admission medications   Medication Sig Start Date End Date Taking? Authorizing Provider  amLODipine (NORVASC) 5 MG tablet TAKE 1 TABLET BY MOUTH ONCE DAILY. 03/09/21   [name], MD  clobetasol ointment (TEMOVATE) 0.05 % APPLY THIN AMOUNT TWICE DAILY TO AFFECTED AREA AS NEEDED.(DO NOT APPLY TO FACIAL AREA) 03/24/21   [name], MD  erythromycin ophthalmic ointment Place 1 application into both eyes at bedtime. 10/21/20   Novella Olive, NP  rosuvastatin (CRESTOR) 5 MG tablet TAKE 1 TABLET BY MOUTH ONCE A DAY. 10/28/21   [name], MD  triamcinolone cream (KENALOG) 0.1 % APPLY TO AFFECTED AREAS TWICE DAILY. 02/04/21   [name], MD      Allergies    Patient has no known allergies.    Review of Systems   Review of Systems  All other systems reviewed and are negative.  Physical Exam Updated Vital Signs BP (!) 155/88   Pulse 83   Temp 98.4 F (36.9 C) (Oral)   Resp 16   Ht 6\' 3"  (1.905 m)   Wt 132 kg   SpO2 98%   BMI 36.37 kg/m  Physical Exam Vitals and nursing note reviewed.  46 year old male, resting comfortably and in no acute distress.  Vital signs are significant for elevated blood pressure. Oxygen saturation is 98%, which is normal. Head is normocephalic and atraumatic. PERRLA, EOMI. There is mild to moderate facial swelling most obvious in the periorbital area.  There is mild edema of the uvula, but no pooling of secretions and phonation is normal. Neck is nontender and supple without adenopathy or JVD. Back is nontender and there is no CVA tenderness. Lungs are clear without rales, wheezes, or rhonchi. Chest is nontender. Heart has regular rate and rhythm without murmur. Abdomen is soft, flat, nontender. Extremities have trace edema, full range of motion is present. Skin is warm and dry without rash. Neurologic: Mental status is normal, cranial nerves are intact, moves all extremities equally.  ED Results / Procedures / Treatments   Labs (all labs ordered are listed, but only abnormal results are displayed) Labs Reviewed - No data to display  EKG EKG Interpretation  Date/Time:  Wednesday November 16 2021 05:05:57 EDT Ventricular Rate:  89 PR Interval:  158 QRS Duration: 102 QT Interval:  380 QTC Calculation: 463 R Axis:   -42 Text Interpretation: Sinus rhythm Left axis deviation Abnormal R-wave progression, late transition Baseline wander in lead(s) V5 When compared with ECG of 11/18/2020, No significant change was found Confirmed by  [name] ([id number]) on 11/16/2021 5:12:22 AM  Procedures Procedures  Cardiac monitor shows normal sinus rhythm, per my interpretation.  Medications Ordered in ED Medications  predniSONE (DELTASONE) tablet 60 mg (has no administration in time range)  diphenhydrAMINE (BENADRYL) capsule 25 mg (has no administration in time range)    ED Course/ Medical Decision Making/ A&P                           Medical Decision Making Risk Prescription drug management.   Facial swelling and hives suspicious for an allergic reaction.  He has not been on any new medications and has no known new  exposures.  No indication for laboratory testing or imaging today.  I have ordered a dose of prednisone and diphenhydramine.  I have reviewed and interpreted the ECG, there is mild left axis deviation but no ST or T changes, no change from prior.  Following above-noted medication, patient states no significant change, but most of his symptoms actually resolved prior to coming to the emergency department.  He is discharged with a prescription for 5-day course of prednisone and he is advised to use over-the-counter second-generation antihistamines.  Given that symptoms always occur during the nighttime, I have recommended that he take the medication in the early evening hours.  Told to use diphenhydramine as needed.  Return precautions discussed.  He will need to discuss with his primary care provider possible referral to  Final Clinical Impression(s) / ED Diagnoses Final diagnoses:  Allergic reaction, initial encounter  Elevated blood pressure reading with diagnosis of hypertension    Rx / DC Orders ED Discharge Orders          Ordered    predniSONE (DELTASONE) 50 MG tablet  Daily        11/16/21 0652              [name], MD 11/16/21 [phone]

## 2021-11-16 NOTE — Discharge Instructions (Addendum)
Please take either loratadine (Claritin) or cetirizine (Zyrtec) once a day.  Preferably, take it in the early evening.  Take diphenhydramine (Benadryl) as needed for itching or swelling.  Follow-up with your primary care provider.  Please discuss with them whether you should be evaluated by an allergist.  If at anytime you develop difficulty breathing or swallowing, come to the emergency department immediately.

## 2021-11-17 ENCOUNTER — Encounter: Payer: Self-pay | Admitting: Family Medicine

## 2021-11-17 ENCOUNTER — Ambulatory Visit (INDEPENDENT_AMBULATORY_CARE_PROVIDER_SITE_OTHER): Payer: 59 | Admitting: Family Medicine

## 2021-11-17 VITALS — BP 154/85 | HR 109 | Temp 97.8°F | Wt 296.7 lb

## 2021-11-17 DIAGNOSIS — L03211 Cellulitis of face: Secondary | ICD-10-CM

## 2021-11-17 DIAGNOSIS — K047 Periapical abscess without sinus: Secondary | ICD-10-CM | POA: Diagnosis not present

## 2021-11-17 DIAGNOSIS — R7301 Impaired fasting glucose: Secondary | ICD-10-CM

## 2021-11-17 DIAGNOSIS — E785 Hyperlipidemia, unspecified: Secondary | ICD-10-CM

## 2021-11-17 DIAGNOSIS — R6882 Decreased libido: Secondary | ICD-10-CM | POA: Diagnosis not present

## 2021-11-17 DIAGNOSIS — L509 Urticaria, unspecified: Secondary | ICD-10-CM

## 2021-11-17 DIAGNOSIS — I10 Essential (primary) hypertension: Secondary | ICD-10-CM

## 2021-11-17 MED ORDER — AMOXICILLIN 500 MG PO CAPS: 500.0000 mg | ORAL_CAPSULE | Freq: Three times a day (TID) | ORAL | 0 refills | Status: AC

## 2021-11-17 NOTE — Progress Notes (Signed)
   Subjective:    Patient ID: Nicolas [name], male    DOB: December 15, 1975, 46 y.o.   MRN: [id number]  HPI Pt presents with facial swelling. Pt had carpal tunnel surgery back in February.    pt states he has issues with 2 teeth.   Pt states that the swelling began on Wednesday.    Pt would like to talk with provider about testosterone.    Review of Systems     Objective:   Physical Exam  He does have some puffiness around his eyes and of his facial area plus also tenderness of the gums      Assessment & Plan:  Antibiotic prescribed 1. Tooth infection Antibiotic prescribed - Hemoglobin A1c - Basic metabolic panel - Hepatic function panel - Lipid panel - Testosterone  2. Facial cellulitis Antibiotic prescribed should gradually get better if not better in the next 7 to 10 days notify us - Hemoglobin A1c - Basic metabolic panel - Hepatic function panel - Lipid panel - Testosterone  3. Hives Referral to allergist because the hives on the back more than likely food allergy - Hemoglobin A1c - Basic metabolic panel - Hepatic function panel - Lipid panel - Testosterone - Ambulatory referral to Allergy  4. Low libido Check testosterone level fasting realistic expectations discussed - Hemoglobin A1c - Basic metabolic panel - Hepatic function panel - Lipid panel - Testosterone  5. Essential hypertension Blood pressure continue current medication - Hemoglobin A1c - Basic metabolic panel - Hepatic function panel - Lipid panel - Testosterone  6. Hyperlipidemia, unspecified hyperlipidemia type Cholesterol check levels watch diet continue medication - Hemoglobin A1c - Basic metabolic panel - Hepatic function panel - Lipid panel - Testosterone  7. Fasting hyperglycemia Healthy diet check labs - Hemoglobin A1c - Basic metabolic panel - Hepatic function panel - Lipid panel - Testosterone

## 2021-11-18 ENCOUNTER — Other Ambulatory Visit: Payer: Self-pay | Admitting: Family Medicine

## 2021-11-18 ENCOUNTER — Ambulatory Visit (INDEPENDENT_AMBULATORY_CARE_PROVIDER_SITE_OTHER): Payer: 59 | Admitting: Orthopedic Surgery

## 2021-11-18 DIAGNOSIS — G5602 Carpal tunnel syndrome, left upper limb: Secondary | ICD-10-CM

## 2021-11-18 DIAGNOSIS — R7301 Impaired fasting glucose: Secondary | ICD-10-CM

## 2021-11-18 DIAGNOSIS — L03211 Cellulitis of face: Secondary | ICD-10-CM

## 2021-11-18 DIAGNOSIS — I10 Essential (primary) hypertension: Secondary | ICD-10-CM

## 2021-11-18 DIAGNOSIS — E785 Hyperlipidemia, unspecified: Secondary | ICD-10-CM

## 2021-11-18 DIAGNOSIS — R6882 Decreased libido: Secondary | ICD-10-CM

## 2021-11-18 NOTE — Progress Notes (Signed)
Post-Op Visit Note   Patient: Nicolas [name]           Date of Birth: December 07, 1975           MRN: [id number] Visit Date: 11/18/2021 PCP: [name], MD   Assessment & Plan:  Chief Complaint:  Chief Complaint  Patient presents with   Left Hand - Routine Post Op   Visit Diagnoses:  1. Carpal tunnel syndrome, left upper limb     Plan: Patient is 10 day s/p left carpal tunnel release.  He's doing well postoperatively.  His numbness and paresthesias have resolved as have his nocturnal symptoms.  His incision is clean and dry.  The sutures were removed.  He can follow up again as needed.   Follow-Up Instructions: No follow-ups on file.   Orders:  No orders of the defined types were placed in this encounter.  No orders of the defined types were placed in this encounter.   Imaging: No results found.  PMFS History: Patient Active Problem List   Diagnosis Date Noted   Carpal tunnel syndrome, left upper limb    Carpal tunnel syndrome, right upper limb    Bilateral carpal tunnel syndrome 06/02/2021   Bilateral hand numbness 05/12/2021   Acute bacterial conjunctivitis of both eyes 10/21/2020   Allergic conjunctivitis of both eyes 10/21/2020   Flank pain 05/04/2020   Acute cystitis without hematuria 05/04/2020   Peptic ulcer disease 02/10/2014   Gastric ulcer 02/09/2014   Nausea & vomiting 02/09/2014   Nausea with vomiting 02/08/2014   Obesity 02/01/2014   Alcohol use 01/31/2014   Acute pancreatitis 01/30/2014   Musculoskeletal chest pain 01/19/2014   Essential hypertension 10/24/2012   Past Medical History:  Diagnosis Date   History of stomach ulcers    Hypertension    Kidney stone    bil , different times  passed stones   Migraines    Pancreatitis     Family History  Problem Relation Age of Onset   Cancer Mother        Breast   Colon cancer Neg Hx     Past Surgical History:  Procedure Laterality Date   BIOPSY N/A 02/11/2014   Procedure: BIOPSY;   Surgeon: Corbin Ade, MD;  Location: [facility];  Service: Endoscopy;  Laterality: N/A;   CARDIAC CATHETERIZATION  Aug 2015   normal EF, normal coronaries   CARDIAC CATHETERIZATION     CARPAL TUNNEL RELEASE Right 08/03/2021   Procedure: Right CARPAL TUNNEL RELEASE;  Surgeon: Marlyne Beards, MD;  Location: [facility];  Service: Orthopedics;  Laterality: Right;   CARPAL TUNNEL RELEASE Left 11/09/2021   Procedure: LEFT CARPAL TUNNEL RELEASE;  Surgeon: Marlyne Beards, MD;  Location: [facility];  Service: Orthopedics;  Laterality: Left;   ESOPHAGOGASTRODUODENOSCOPY (EGD) WITH PROPOFOL N/A 02/11/2014   Procedure: ESOPHAGOGASTRODUODENOSCOPY (EGD) WITH PROPOFOL;  Surgeon: Corbin Ade, MD;  Location: [facility];  Service: Endoscopy;  Laterality: N/A;   LEFT HEART CATHETERIZATION WITH CORONARY ANGIOGRAM N/A 01/18/2014   Procedure: LEFT HEART CATHETERIZATION WITH CORONARY ANGIOGRAM;  Surgeon: Lennette Bihari, MD;  Location: [facility] CATH LAB;  Service: Cardiovascular;  Laterality: N/A;   Social History   Occupational History   Not on file  Tobacco Use   Smoking status: Former    Packs/day: 2.00    Years: 10.00    Total pack years: 20.00    Types: Cigarettes    Quit date: 12/05/2020    Years since  quitting: 0.9   Smokeless tobacco: Never  Vaping Use   Vaping Use: Never used  Substance and Sexual Activity   Alcohol use: No   Drug use: No   Sexual activity: Yes    Birth control/protection: None

## 2021-12-16 ENCOUNTER — Ambulatory Visit (INDEPENDENT_AMBULATORY_CARE_PROVIDER_SITE_OTHER): Payer: 59 | Admitting: Allergy

## 2021-12-16 ENCOUNTER — Encounter: Payer: Self-pay | Admitting: Allergy

## 2021-12-16 VITALS — BP 140/86 | HR 85 | Temp 97.6°F | Resp 16 | Ht 75.0 in | Wt 298.0 lb

## 2021-12-16 DIAGNOSIS — L309 Dermatitis, unspecified: Secondary | ICD-10-CM | POA: Diagnosis not present

## 2021-12-16 DIAGNOSIS — L508 Other urticaria: Secondary | ICD-10-CM

## 2021-12-16 DIAGNOSIS — T783XXD Angioneurotic edema, subsequent encounter: Secondary | ICD-10-CM

## 2021-12-16 MED ORDER — FAMOTIDINE 20 MG PO TABS: 20.0000 mg | ORAL_TABLET | Freq: Two times a day (BID) | ORAL | 5 refills | Status: DC

## 2021-12-16 MED ORDER — FEXOFENADINE HCL 180 MG PO TABS: 180.0000 mg | ORAL_TABLET | Freq: Two times a day (BID) | ORAL | 5 refills | Status: DC

## 2021-12-16 NOTE — Patient Instructions (Signed)
Swelling and Hives - at this time etiology of hives and swelling is unknown.  Hives can be caused by a variety of different triggers including illness/infection, foods, medications, stings, exercise, pressure, vibrations, extremes of temperature to name a few however majority of the time there is no identifiable trigger.  Your symptoms have been ongoing for >6 weeks making this chronic thus will obtain labwork to evaluate: CBC w diff, CMP, tryptase, hive panel, environmental panel, alpha-gal panel - for hives and swelling recommend use of antihistamine regimen as follows: Allegra 1 tab daily with Pepcid 1 tab daily.    If daily dosing of both are not enough to control hives or swelling then increase both to twice a day dosing.   - if high-dose antihistamines (twice a day dosing) is not enough to control hives/swelling then consider Xolair monthly injections for management of chronic hives/swelling.  - until labs come back would avoid red meat products.  If alpha gal panel is positive then will prescribe you an epinephrine device to have on hand.  - should significant symptoms recur or new symptoms occur, a journal is to be kept recording any foods eaten, beverages consumed, medications taken, activities performed, and environmental conditions within a 6 hour time period prior to the onset of symptoms.  Dermatitis - continue your current topical regimen as directed by your dermatologist  Follow-up in 2 months or sooner if needed

## 2021-12-16 NOTE — Progress Notes (Signed)
New Patient Note  RE: Nicolas [name] MRN: [id number] DOB: [birth year] Date of Office Visit: 12/16/2021  Primary care provider: [name], MD  Chief Complaint: swelling and hives  History of present illness: Nicolas [name] is a 46 y.o. male presenting today for evaluation of swelling and hives.    He states for the past 2 years he has had intermittent eye swelling. He states it was happening every week for a while.  He states his PCP initially thought stuff was getting in his eye as he works in [employer].  He also works in Therap[employer]. When he was having eye swelling he states he will also have crusting of the eyelids.  He states the eye swelling is happening less however he has had some new issues arise. Within the last couple months he has had face swelling episodes in the mornings. Last episode was this Monday.   This has been happening almost weekly.  He states he would go to sleep fine and wake up with facial swelling.  The facial swelling would resolve within a few hours without any intervention.   In the past couple weeks he has been noticing hives across his back.  The hives were itchy.  He states his body felt warm.  The hives lasted about a hour or so before resolved.  He did go to the ED with this on 11/16/2021 where on exam he was noted to have "mild to moderate facial swelling most obvious in the periorbital area.  There is mild edema of the uvula, but no pooling of secretions and phonation is normal".  He was treated with prednisone and Benadryl orally.He was monitored and able to discharge home to continue taking a prednisone burst.    He has had itching of his hands and can swell as well.  He did have carpal tunnel release in May of this year.    He states he has had his vents all cleaned out as didn't know if his symptoms are related to some type of allergen exposure.  No preceding illnesses.  No new medications or foods.  No changes in detergents/lotions/body  products.  He states he has always had dandruff in his scalp however it is worse.  He also has areas on his leg that is red and scaly.  He also has areas on his elbows that he states used to be completely white plaque that has improved.  He is seeing a dermatologist as he states he was diagnosed with psoriasis and eczema.  He has been prescribed triamcinolone cream, clobetasol ointment as well as betamethasone cream.    He states he had Covid in 2021 and feels like that may have set off this issue he has been having with swelling.   Review of systems: Review of Systems  Constitutional: Negative.   HENT: Negative.    Eyes: Negative.   Respiratory: Negative.    Cardiovascular: Negative.   Musculoskeletal: Negative.   Skin:        See HPI  Allergic/Immunologic: Negative.   Neurological: Negative.     All other systems negative unless noted above in HPI  Past medical history: Past Medical History:  Diagnosis Date   History of stomach ulcers    Hypertension    Kidney stone    bil , different times  passed stones   Migraines    Pancreatitis     Past surgical history: Past Surgical History:  Procedure Laterality Date   BIOPSY N/A  02/11/2014   Procedure: BIOPSY;  Surgeon: Corbin Ade, MD;  Location: [facility];  Service: Endoscopy;  Laterality: N/A;   CARDIAC CATHETERIZATION  Aug 2015   normal EF, normal coronaries   CARDIAC CATHETERIZATION     CARPAL TUNNEL RELEASE Right 08/03/2021   Procedure: Right CARPAL TUNNEL RELEASE;  Surgeon: Marlyne Beards, MD;  Location: [facility];  Service: Orthopedics;  Laterality: Right;   CARPAL TUNNEL RELEASE Left 11/09/2021   Procedure: LEFT CARPAL TUNNEL RELEASE;  Surgeon: Marlyne Beards, MD;  Location: [facility];  Service: Orthopedics;  Laterality: Left;   ESOPHAGOGASTRODUODENOSCOPY (EGD) WITH PROPOFOL N/A 02/11/2014   Procedure: ESOPHAGOGASTRODUODENOSCOPY (EGD) WITH PROPOFOL;  Surgeon: Corbin Ade, MD;   Location: [facility];  Service: Endoscopy;  Laterality: N/A;   LEFT HEART CATHETERIZATION WITH CORONARY ANGIOGRAM N/A 01/18/2014   Procedure: LEFT HEART CATHETERIZATION WITH CORONARY ANGIOGRAM;  Surgeon: Lennette Bihari, MD;  Location[facility];  Service: Cardiovascular;  Laterality: N/A;    Family history:  Family History  Problem Relation Age of Onset   Cancer Mother        Breast   Colon cancer Neg Hx     Social history: Lives in a home with carpeting with electric heating and central cooling.  Dog in the home.  There is no concern for water damage, mildew or roaches in the home.  He is self-employed. Tobacco Use   Smoking status: Former    Packs/day: 2.00    Years: 10.00    Total pack years: 20.00    Types: Cigarettes    Quit date: 12/05/2020    Years since quitting: 1.0   Smokeless tobacco: Never  Vaping Use   Vaping Use: Never used     Medication List: Current Outpatient Medications  Medication Sig Dispense Refill   amLODipine (NORVASC) 5 MG tablet TAKE 1 TABLET BY MOUTH ONCE DAILY. 30 tablet 3   clobetasol ointment (TEMOVATE) 0.05 % APPLY THIN AMOUNT TWICE DAILY TO AFFECTED AREA AS NEEDED.(DO NOT APPLY TO FACIAL AREA) 45 g 2   famotidine (PEPCID) 20 MG tablet Take 1 tablet (20 mg total) by mouth 2 (two) times daily. 60 tablet 5   fexofenadine (ALLEGRA ALLERGY) 180 MG tablet Take 1 tablet (180 mg total) by mouth 2 (two) times daily. 60 tablet 5   rosuvastatin (CRESTOR) 5 MG tablet TAKE 1 TABLET BY MOUTH ONCE A DAY. 30 tablet 0   triamcinolone cream (KENALOG) 0.1 % APPLY TO AFFECTED AREAS TWICE DAILY. 60 g 2   betamethasone dipropionate 0.05 % cream Apply topically.     No current facility-administered medications for this visit.    Known medication allergies: No Known Allergies   Physical examination: Blood pressure 140/86, pulse 85, temperature 97.6 F (36.4 C), resp. rate 16, height 6\' 3"  (1.905 m), weight 298 lb (135.2 kg), SpO2 98 %.  General: Alert,  interactive, in no acute distress. HEENT: PERRLA, TMs pearly gray, turbinates non-edematous without discharge, post-pharynx non erythematous. Neck: Supple without lymphadenopathy. Lungs: Clear to auscultation without wheezing, rhonchi or rales. {no increased work of breathing. CV: Normal S1, S2 without murmurs. Abdomen: Nondistended, nontender. Skin: Erythematous patch on the lower medial leg bilaterally with scale; left frontal area of scalp with erythema and scale . Extremities:  No clubbing, cyanosis or edema. Neuro:   Grossly intact.  Diagnositics/Labs: None today  Assessment and plan: Angioedema with urticaria - at this time etiology of hives and swelling is unknown.  Hives can be  caused by a variety of different triggers including illness/infection, foods, medications, stings, exercise, pressure, vibrations, extremes of temperature to name a few however majority of the time there is no identifiable trigger.  Your symptoms have been ongoing for >6 weeks making this chronic thus will obtain labwork to evaluate: CBC w diff, CMP, tryptase, hive panel, environmental panel, alpha-gal panel - for hives and swelling recommend use of antihistamine regimen as follows: Allegra 1 tab daily with Pepcid 1 tab daily.    If daily dosing of both are not enough to control hives or swelling then increase both to twice a day dosing.   - if high-dose antihistamines (twice a day dosing) is not enough to control hives/swelling then consider Xolair monthly injections for management of chronic hives/swelling.  - until labs come back would avoid red meat products.  If alpha gal panel is positive then will prescribe you an epinephrine device to have on hand.  - should significant symptoms recur or new symptoms occur, a journal is to be kept recording any foods eaten, beverages consumed, medications taken, activities performed, and environmental conditions within a 6 hour time period prior to the onset of  symptoms.  Dermatitis -psoriasis, eczema - continue your current topical regimen as directed by your dermatologist  Follow-up in 2 months or sooner if needed  I appreciate the opportunity to take part in Dene's care. Please do not hesitate to contact me with questions.  Sincerely,   Margo Aye, MD Allergy/Immunology [facility]

## 2021-12-23 LAB — CBC WITH DIFFERENTIAL
Basophils Absolute: 0 10*3/uL (ref 0.0–0.2)
Basos: 1 %
EOS (ABSOLUTE): 0.2 10*3/uL (ref 0.0–0.4)
Eos: 3 %
Hematocrit: 45.3 % (ref 37.5–51.0)
Hemoglobin: 15.6 g/dL (ref 13.0–17.7)
Immature Grans (Abs): 0 10*3/uL (ref 0.0–0.1)
Immature Granulocytes: 0 %
Lymphocytes Absolute: 2 10*3/uL (ref 0.7–3.1)
Lymphs: 33 %
MCH: 31.3 pg (ref 26.6–33.0)
MCHC: 34.4 g/dL (ref 31.5–35.7)
MCV: 91 fL (ref 79–97)
Monocytes Absolute: 0.5 10*3/uL (ref 0.1–0.9)
Monocytes: 8 %
Neutrophils Absolute: 3.5 10*3/uL (ref 1.4–7.0)
Neutrophils: 55 %
RBC: 4.98 x10E6/uL (ref 4.14–5.80)
RDW: 13.6 % (ref 11.6–15.4)
WBC: 6.2 10*3/uL (ref 3.4–10.8)

## 2021-12-23 LAB — ALLERGENS W/TOTAL IGE AREA 2
Alternaria Alternata IgE: <0.1 kU/L
Aspergillus Fumigatus IgE: <0.1 kU/L
Bermuda Grass IgE: 0.65 kU/L — AB
Cat Dander IgE: 0.14 kU/L — AB
Cedar, Mountain IgE: 0.59 kU/L — AB
Cladosporium Herbarum IgE: <0.1 kU/L
Cockroach, German IgE: 0.75 kU/L — AB
Common Silver Birch IgE: 0.37 kU/L — AB
Cottonwood IgE: 0.6 kU/L — AB
D Farinae IgE: 0.15 kU/L — AB
D Pteronyssinus IgE: 0.11 kU/L — AB
Dog Dander IgE: 0.28 kU/L — AB
Elm, American IgE: 0.67 kU/L — AB
Johnson Grass IgE: 0.69 kU/L — AB
Maple/Box Elder IgE: 0.64 kU/L — AB
Mouse Urine IgE: <0.1 kU/L
Oak, White IgE: 0.67 kU/L — AB
Pecan, Hickory IgE: 0.48 kU/L — AB
Penicillium Chrysogen IgE: <0.1 kU/L
Pigweed, Rough IgE: 0.58 kU/L — AB
Ragweed, Short IgE: 4.69 kU/L — AB
Sheep Sorrel IgE Qn: 0.67 kU/L — AB
Timothy Grass IgE: 0.74 kU/L — AB
White Mulberry IgE: 0.41 kU/L — AB

## 2021-12-23 LAB — THYROID ANTIBODIES
Thyroglobulin Antibody: <1 IU/mL (ref 0.0–0.9)
Thyroperoxidase Ab SerPl-aCnc: 11 IU/mL (ref 0–34)

## 2021-12-23 LAB — COMPREHENSIVE METABOLIC PANEL
ALT: 26 IU/L (ref 0–44)
AST: 20 IU/L (ref 0–40)
Albumin/Globulin Ratio: 1.6 (ref 1.2–2.2)
Albumin: 4.5 g/dL (ref 4.0–5.0)
Alkaline Phosphatase: 111 IU/L (ref 44–121)
BUN/Creatinine Ratio: 22 — ABNORMAL HIGH (ref 9–20)
BUN: 19 mg/dL (ref 6–24)
Bilirubin Total: 0.5 mg/dL (ref 0.0–1.2)
CO2: 23 mmol/L (ref 20–29)
Calcium: 9.5 mg/dL (ref 8.7–10.2)
Chloride: 101 mmol/L (ref 96–106)
Creatinine, Ser: 0.87 mg/dL (ref 0.76–1.27)
Globulin, Total: 2.8 g/dL (ref 1.5–4.5)
Glucose: 172 mg/dL — ABNORMAL HIGH (ref 70–99)
Potassium: 4.4 mmol/L (ref 3.5–5.2)
Sodium: 138 mmol/L (ref 134–144)
Total Protein: 7.3 g/dL (ref 6.0–8.5)
eGFR: 109 mL/min/{1.73_m2} (ref >59)

## 2021-12-23 LAB — CHRONIC URTICARIA: cu index: <4.4 (ref <10)

## 2021-12-23 LAB — ALPHA-GAL PANEL
Allergen Lamb IgE: 0.71 kU/L — AB
Beef IgE: 1.37 kU/L — AB
IgE (Immunoglobulin E), Serum: 774 IU/mL — ABNORMAL HIGH (ref 6–495)
O215-IgE Alpha-Gal: 3.55 kU/L — AB
Pork IgE: 0.78 kU/L — AB

## 2021-12-23 LAB — TRYPTASE: Tryptase: 17.4 ug/L — ABNORMAL HIGH (ref 2.2–13.2)

## 2021-12-26 ENCOUNTER — Other Ambulatory Visit: Payer: Self-pay | Admitting: Family Medicine

## 2021-12-26 DIAGNOSIS — I10 Essential (primary) hypertension: Secondary | ICD-10-CM

## 2022-01-24 ENCOUNTER — Other Ambulatory Visit: Payer: Self-pay | Admitting: Family Medicine

## 2022-01-24 DIAGNOSIS — I10 Essential (primary) hypertension: Secondary | ICD-10-CM

## 2022-02-27 ENCOUNTER — Other Ambulatory Visit: Payer: Self-pay | Admitting: Family Medicine

## 2022-02-27 DIAGNOSIS — I10 Essential (primary) hypertension: Secondary | ICD-10-CM

## 2022-03-28 ENCOUNTER — Ambulatory Visit (INDEPENDENT_AMBULATORY_CARE_PROVIDER_SITE_OTHER): Payer: 59 | Admitting: Family Medicine

## 2022-03-28 ENCOUNTER — Encounter: Payer: Self-pay | Admitting: Family Medicine

## 2022-03-28 VITALS — BP 130/80 | HR 82 | Temp 98.1°F | Wt 286.4 lb

## 2022-03-28 DIAGNOSIS — L400 Psoriasis vulgaris: Secondary | ICD-10-CM

## 2022-03-28 MED ORDER — CLOBETASOL PROPIONATE 0.05 % EX OINT: TOPICAL_OINTMENT | CUTANEOUS | 2 refills | Status: DC

## 2022-03-28 NOTE — Assessment & Plan Note (Signed)
Clobetasol was refilled.  I got the patient an appointment with [facility] dermatology in [facility] tomorrow.  Desires systemic treatment.

## 2022-03-28 NOTE — Patient Instructions (Signed)
[facility]  Address: [address], [place], [place] [address] Phone: [phone]  Appt 8 am tomorrow.

## 2022-03-28 NOTE — Progress Notes (Signed)
Subjective:  Patient ID: Nicolas [name], male    DOB: 1976-04-29  Age: 46 y.o. MRN: [id number]  CC: Chief Complaint  Patient presents with   psoriais    Pt arrives with psoriasis flare up on elbows and knees. Pt states he has heard about a pill that he would like to discuss.     HPI:  46 year old male with plaque psoriasis presents with worsening plaque psoriasis.  Affects the extensor surfaces of the elbow as well as the knees and lower extremities.  Areas are dry and cracked.  Areas tend to bleed.  He states that they are quite painful.  Interferes with sleep.  He has been treated with topical therapy for years.  He has seen dermatology several months ago.  Given the severity of his symptoms, he wishes to have systemic treatment.  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   Bilateral carpal tunnel syndrome 06/02/2021   Peptic ulcer disease 02/10/2014   Gastric ulcer 02/09/2014   Obesity 02/01/2014   Alcohol use 01/31/2014   Essential hypertension 10/24/2012    Social Hx   Social History   Socioeconomic History   Marital status: Married    Spouse name: Not on file   Number of children: Not on file   Years of education: Not on file   Highest education level: Not on file  Occupational History   Not on file  Tobacco Use   Smoking status: Former    Packs/day: 2.00    Years: 10.00    Total pack years: 20.00    Types: Cigarettes    Quit date: 12/05/2020    Years since quitting: 1.3   Smokeless tobacco: Never  Vaping Use   Vaping Use: Never used  Substance and Sexual Activity   Alcohol use: No   Drug use: No   Sexual activity: Yes    Birth control/protection: None  Other Topics Concern   Not on file  Social History Narrative   Not on file   Social Determinants of Health   Financial Resource Strain: Not on file  Food Insecurity: Not on file  Transportation Needs: Not on file  Physical Activity: Not on file  Stress:  Not on file  Social Connections: Not on file    Review of Systems Per HPI  Objective:  BP 130/80   Pulse 82   Temp 98.1 F (36.7 C)   Wt 286 lb 6.4 oz (129.9 kg)   SpO2 97%   BMI 35.80 kg/m      03/28/2022    2:21 PM 12/16/2021    8:37 AM 11/17/2021    4:05 PM  BP/Weight  Systolic BP 130 140 154  Diastolic BP 80 86 85  Wt. (Lbs) 286.4 298 296.7  BMI 35.8 kg/m2 37.25 kg/m2 37.08 kg/m2    Physical Exam Vitals and nursing note reviewed.  Constitutional:      General: He is not in acute distress.    Appearance: Normal appearance. He is obese. He is not ill-appearing.  Skin:    Comments: Extensor surfaces of the elbows and the extensor surfaces of the knees with thick, dry skin plaques.  Neurological:     Mental Status: He is alert.  Psychiatric:        Mood and Affect: Mood normal.        Behavior: Behavior normal.    Lab Results  Component Value Date   WBC 6.2 12/16/2021   HGB 15.6  12/16/2021   HCT 45.3 12/16/2021   PLT 277 11/18/2020   GLUCOSE 172 (H) 12/16/2021   CHOL 196 12/09/2020   TRIG 165 (H) 12/09/2020   HDL 34 (L) 12/09/2020   LDLCALC 132 (H) 12/09/2020   ALT 26 12/16/2021   AST 20 12/16/2021   NA 138 12/16/2021   K 4.4 12/16/2021   CL 101 12/16/2021   CREATININE 0.87 12/16/2021   BUN 19 12/16/2021   CO2 23 12/16/2021   INR 1.20 01/18/2014   HGBA1C 5.2 01/18/2014     Assessment & Plan:   Problem List Items Addressed This Visit       Musculoskeletal and Integument   Plaque psoriasis - Primary    Clobetasol was refilled.  I got the patient an appointment with [facility] dermatology in [facility] tomorrow.  Desires systemic treatment.      Relevant Orders   Ambulatory referral to Dermatology    Meds ordered this encounter  Medications   clobetasol ointment (TEMOVATE) 0.05 %    Sig: APPLY THIN AMOUNT TWICE DAILY TO AFFECTED AREA AS NEEDED.(DO NOT APPLY TO FACIAL AREA)    Dispense:  45 g    Refill:  [place]

## 2022-04-05 ENCOUNTER — Emergency Department (HOSPITAL_COMMUNITY): Payer: 59

## 2022-04-05 ENCOUNTER — Observation Stay (HOSPITAL_COMMUNITY)
Admission: EM | Admit: 2022-04-05 | Discharge: 2022-04-07 | Disposition: A | Discharge status: Home / Self Care | Payer: 59 | Attending: Family Medicine | Admitting: Family Medicine

## 2022-04-05 ENCOUNTER — Encounter (HOSPITAL_COMMUNITY): Payer: Self-pay | Admitting: Emergency Medicine

## 2022-04-05 DIAGNOSIS — R52 Pain, unspecified: Secondary | ICD-10-CM | POA: Diagnosis not present

## 2022-04-05 DIAGNOSIS — R7989 Other specified abnormal findings of blood chemistry: Secondary | ICD-10-CM | POA: Diagnosis not present

## 2022-04-05 DIAGNOSIS — I4510 Unspecified right bundle-branch block: Secondary | ICD-10-CM | POA: Diagnosis not present

## 2022-04-05 DIAGNOSIS — R0902 Hypoxemia: Secondary | ICD-10-CM | POA: Diagnosis not present

## 2022-04-05 DIAGNOSIS — N179 Acute kidney failure, unspecified: Secondary | ICD-10-CM

## 2022-04-05 DIAGNOSIS — I6522 Occlusion and stenosis of left carotid artery: Secondary | ICD-10-CM

## 2022-04-05 DIAGNOSIS — E785 Hyperlipidemia, unspecified: Secondary | ICD-10-CM

## 2022-04-05 DIAGNOSIS — Z87891 Personal history of nicotine dependence: Secondary | ICD-10-CM | POA: Insufficient documentation

## 2022-04-05 DIAGNOSIS — K219 Gastro-esophageal reflux disease without esophagitis: Secondary | ICD-10-CM | POA: Diagnosis not present

## 2022-04-05 DIAGNOSIS — R55 Syncope and collapse: Secondary | ICD-10-CM | POA: Diagnosis not present

## 2022-04-05 DIAGNOSIS — Z743 Need for continuous supervision: Secondary | ICD-10-CM | POA: Diagnosis not present

## 2022-04-05 DIAGNOSIS — R739 Hyperglycemia, unspecified: Secondary | ICD-10-CM | POA: Diagnosis not present

## 2022-04-05 DIAGNOSIS — A419 Sepsis, unspecified organism: Secondary | ICD-10-CM | POA: Diagnosis not present

## 2022-04-05 DIAGNOSIS — Z79899 Other long term (current) drug therapy: Secondary | ICD-10-CM | POA: Diagnosis not present

## 2022-04-05 DIAGNOSIS — I700 Atherosclerosis of aorta: Secondary | ICD-10-CM | POA: Diagnosis not present

## 2022-04-05 DIAGNOSIS — I10 Essential (primary) hypertension: Secondary | ICD-10-CM | POA: Diagnosis present

## 2022-04-05 DIAGNOSIS — R4182 Altered mental status, unspecified: Secondary | ICD-10-CM | POA: Diagnosis present

## 2022-04-05 LAB — COMPREHENSIVE METABOLIC PANEL
ALT: 37 U/L (ref 0–44)
AST: 32 U/L (ref 15–41)
Albumin: 4 g/dL (ref 3.5–5.0)
Alkaline Phosphatase: 95 U/L (ref 38–126)
Anion gap: 12 (ref 5–15)
BUN: 21 mg/dL — ABNORMAL HIGH (ref 6–20)
CO2: 26 mmol/L (ref 22–32)
Calcium: 9.1 mg/dL (ref 8.9–10.3)
Chloride: 103 mmol/L (ref 98–111)
Creatinine, Ser: 1.31 mg/dL — ABNORMAL HIGH (ref 0.61–1.24)
GFR, Estimated: >60 mL/min (ref >60)
Glucose, Bld: 177 mg/dL — ABNORMAL HIGH (ref 70–99)
Potassium: 3.5 mmol/L (ref 3.5–5.1)
Sodium: 141 mmol/L (ref 135–145)
Total Bilirubin: 0.8 mg/dL (ref 0.3–1.2)
Total Protein: 6.9 g/dL (ref 6.5–8.1)

## 2022-04-05 LAB — RAPID URINE DRUG SCREEN, HOSP PERFORMED
Amphetamines: NOT DETECTED
Barbiturates: NOT DETECTED
Benzodiazepines: NOT DETECTED
Cocaine: NOT DETECTED
Opiates: NOT DETECTED
Tetrahydrocannabinol: NOT DETECTED

## 2022-04-05 LAB — TROPONIN I (HIGH SENSITIVITY)
Troponin I (High Sensitivity): 8 ng/L (ref <18)
Troponin I (High Sensitivity): 189 ng/L (ref <18)

## 2022-04-05 LAB — CBC WITH DIFFERENTIAL/PLATELET
Abs Immature Granulocytes: 0.1 10*3/uL — ABNORMAL HIGH (ref 0.00–0.07)
Basophils Absolute: 0.1 10*3/uL (ref 0.0–0.1)
Basophils Relative: 1 %
Eosinophils Absolute: 0.1 10*3/uL (ref 0.0–0.5)
Eosinophils Relative: 1 %
HCT: 44.7 % (ref 39.0–52.0)
Hemoglobin: 15.5 g/dL (ref 13.0–17.0)
Lymphocytes Relative: 51 %
Lymphs Abs: 5.7 10*3/uL — ABNORMAL HIGH (ref 0.7–4.0)
MCH: 30.8 pg (ref 26.0–34.0)
MCHC: 34.7 g/dL (ref 30.0–36.0)
MCV: 88.9 fL (ref 80.0–100.0)
Monocytes Absolute: 0.4 10*3/uL (ref 0.1–1.0)
Monocytes Relative: 4 %
Myelocytes: 1 %
Neutro Abs: 4.7 10*3/uL (ref 1.7–7.7)
Neutrophils Relative %: 42 %
Platelets: 320 10*3/uL (ref 150–400)
RBC: 5.03 MIL/uL (ref 4.22–5.81)
RDW: 14.8 % (ref 11.5–15.5)
WBC: 11.2 10*3/uL — ABNORMAL HIGH (ref 4.0–10.5)
nRBC: 0 % (ref 0.0–0.2)

## 2022-04-05 LAB — BLOOD GAS, VENOUS
Acid-Base Excess: 6.5 mmol/L — ABNORMAL HIGH (ref 0.0–2.0)
Bicarbonate: 31.3 mmol/L — ABNORMAL HIGH (ref 20.0–28.0)
Drawn by: 4252
O2 Saturation: 96 %
Patient temperature: 36.7
pCO2, Ven: 43 mmHg — ABNORMAL LOW (ref 44–60)
pH, Ven: 7.46 — ABNORMAL HIGH (ref 7.25–7.43)
pO2, Ven: 69 mmHg — ABNORMAL HIGH (ref 32–45)

## 2022-04-05 LAB — URINALYSIS, ROUTINE W REFLEX MICROSCOPIC
Bilirubin Urine: NEGATIVE
Glucose, UA: NEGATIVE mg/dL
Hgb urine dipstick: NEGATIVE
Ketones, ur: NEGATIVE mg/dL
Leukocytes,Ua: NEGATIVE
Nitrite: NEGATIVE
Protein, ur: 100 mg/dL — AB
Specific Gravity, Urine: 1.013 (ref 1.005–1.030)
pH: 7 (ref 5.0–8.0)

## 2022-04-05 LAB — LACTIC ACID, PLASMA
Lactic Acid, Venous: 4.7 mmol/L (ref 0.5–1.9)
Lactic Acid, Venous: 1.6 mmol/L (ref 0.5–1.9)

## 2022-04-05 LAB — PROTIME-INR
INR: 1.1 (ref 0.8–1.2)
Prothrombin Time: 13.8 seconds (ref 11.4–15.2)

## 2022-04-05 LAB — APTT: aPTT: 30 seconds (ref 24–36)

## 2022-04-05 MED ORDER — NALOXONE HCL 2 MG/2ML IJ SOSY
1.0000 mg | PREFILLED_SYRINGE | Freq: Once | INTRAMUSCULAR | Status: AC
  Administered 2022-04-05: 1 mg via INTRAVENOUS

## 2022-04-05 MED ORDER — NALOXONE HCL 2 MG/2ML IJ SOSY
PREFILLED_SYRINGE | INTRAMUSCULAR | Status: AC
  Filled 2022-04-05: qty 2

## 2022-04-05 MED ORDER — SODIUM CHLORIDE 0.9 % IV BOLUS
1000.0000 mL | Freq: Once | INTRAVENOUS | Status: AC
  Administered 2022-04-05: 1000 mL via INTRAVENOUS

## 2022-04-05 MED ORDER — POTASSIUM CHLORIDE CRYS ER 20 MEQ PO TBCR
40.0000 meq | EXTENDED_RELEASE_TABLET | Freq: Once | ORAL | Status: AC
  Administered 2022-04-05: 40 meq via ORAL
  Filled 2022-04-05: qty 2

## 2022-04-05 MED ORDER — ASPIRIN 81 MG PO CHEW
324.0000 mg | CHEWABLE_TABLET | Freq: Once | ORAL | Status: AC
  Administered 2022-04-05: 324 mg via ORAL
  Filled 2022-04-05: qty 4

## 2022-04-05 MED ORDER — IOHEXOL 350 MG/ML SOLN
100.0000 mL | Freq: Once | INTRAVENOUS | Status: AC | PRN
  Administered 2022-04-05: 100 mL via INTRAVENOUS

## 2022-04-05 MED ORDER — MAGNESIUM SULFATE 2 GM/50ML IV SOLN
2.0000 g | Freq: Once | INTRAVENOUS | Status: AC
  Administered 2022-04-05: 2 g via INTRAVENOUS
  Filled 2022-04-05: qty 50

## 2022-04-05 NOTE — ED Triage Notes (Signed)
Pt BIB [facility] from home due to pt being unresponsive. Pt found to be hypoxic on scene at 65% with agonal respirations. Pt given 4mg  narcan nasal by EMS due to pinpoint pupils.   Pt awake and answering questions on arrival to ED. EDP at bedside.

## 2022-04-05 NOTE — ED Provider Notes (Signed)
[facility] EMERGENCY DEPARTMENT Provider Note   CSN: [id number] Arrival date & time: 04/05/22  1949     History  Chief Complaint  Patient presents with   Altered Mental Status    Nicolas [name] is a 46 y.o. male with past medical history significant for hypertension, previous alcohol use but no recent use per patient, peptic ulcer disease, psoriasis, who presents after found unresponsive by EMS and agonal respiration, oxygen saturation 65% on room air.  He denies any drug use, he has remote history of opioid, alcohol use, 8 to 10 years ago.  Patient reports that he took his home blood pressure, cholesterol medications, while dying his beard tonight, he felt like his chest was tightening, and his throat was closing and the next thing he remembers he was waking up in the hospital.  He denies any nausea, vomiting he has no track marks on his arm.  He reports he had a similar episode last year that they wrote off as heat stroke, he followed up with cardiology who did no further investigation.  He reports remote cardiac cath in 2015, on review no report is available, he reported no blockage but some kind of valve dysfunction was noted previously.   Altered Mental Status      Home Medications Prior to Admission medications   Medication Sig Start Date End Date Taking? Authorizing Provider  amLODipine (NORVASC) 5 MG tablet TAKE 1 TABLET BY MOUTH ONCE DAILY. 02/27/22  Yes Luking, Scott A, MD  clobetasol ointment (TEMOVATE) 0.05 % APPLY THIN AMOUNT TWICE DAILY TO AFFECTED AREA AS NEEDED.(DO NOT APPLY TO FACIAL AREA) 03/28/22  Yes Cook, Jayce G, DO  famotidine (PEPCID) 20 MG tablet Take 1 tablet (20 mg total) by mouth 2 (two) times daily. 12/16/21  Yes Padgett, Pilar Grammes, MD  rosuvastatin (CRESTOR) 5 MG tablet TAKE 1 TABLET BY MOUTH ONCE A DAY. 02/27/22  Yes [name], MD  fexofenadine (ALLEGRA ALLERGY) 180 MG tablet Take 1 tablet (180 mg total) by mouth 2 (two) times daily. Patient  not taking: Reported on 04/06/2022 12/16/21   Marcelyn Bruins, MD      Allergies    Patient has no known allergies.    Review of Systems   Review of Systems  Respiratory:  Positive for shortness of breath.   Neurological:  Positive for syncope.  All other systems reviewed and are negative.   Physical Exam Updated Vital Signs BP (!) 144/77 (BP Location: Left Arm)   Pulse 82   Temp 98.1 F (36.7 C)   Resp 18   Ht  (1.905 m)   Wt 130.7 kg   SpO2 98%   BMI 36.02 kg/m  Physical Exam Vitals and nursing note reviewed.  Constitutional:      General: He is not in acute distress.    Appearance: Normal appearance. He is ill-appearing.  HENT:     Head: Normocephalic and atraumatic.  Eyes:     General:        Right eye: No discharge.        Left eye: No discharge.  Cardiovascular:     Rate and Rhythm: Normal rate and regular rhythm.     Heart sounds: No murmur heard.    No friction rub. No gallop.  Pulmonary:     Effort: Pulmonary effort is normal.     Breath sounds: Normal breath sounds.     Comments: Patient initially arrives hypoxic with pressured respiration, but without any rhonchi, wheezing,  stridor, rales, or focal consolidation noted Abdominal:     General: Bowel sounds are normal.     Palpations: Abdomen is soft.     Comments: No significant tenderness to palpation  Skin:    General: Skin is warm and dry.     Capillary Refill: Capillary refill takes less than 2 seconds.  Neurological:     Mental Status: He is alert and oriented to person, place, and time.  Psychiatric:        Mood and Affect: Mood normal.        Behavior: Behavior normal.     Comments: Initially patient agitated, excitable, and is difficulty calming down to give a clear history, on reevaluation he is with normal affect, denies any SI, HI, AVH, normal thought content, without response to internal stimuli     ED Results / Procedures / Treatments   Labs (all labs ordered are listed,  but only abnormal results are displayed) Labs Reviewed  LACTIC ACID, PLASMA - Abnormal; Notable for the following components:      Result Value   Lactic Acid, Venous 4.7 (*)    All other components within normal limits  COMPREHENSIVE METABOLIC PANEL - Abnormal; Notable for the following components:   Glucose, Bld 177 (*)    BUN 21 (*)    Creatinine, Ser 1.31 (*)    All other components within normal limits  CBC WITH DIFFERENTIAL/PLATELET - Abnormal; Notable for the following components:   WBC 11.2 (*)    Lymphs Abs 5.7 (*)    Abs Immature Granulocytes 0.10 (*)    All other components within normal limits  URINALYSIS, ROUTINE W REFLEX MICROSCOPIC - Abnormal; Notable for the following components:   APPearance CLOUDY (*)    Protein, ur 100 (*)    Bacteria, UA RARE (*)    All other components within normal limits  BLOOD GAS, VENOUS - Abnormal; Notable for the following components:   pH, Ven 7.46 (*)    pCO2, Ven 43 (*)    pO2, Ven 69 (*)    Bicarbonate 31.3 (*)    Acid-Base Excess 6.5 (*)    All other components within normal limits  HEMOGLOBIN A1C - Abnormal; Notable for the following components:   Hgb A1c MFr Bld 5.9 (*)    All other components within normal limits  COMPREHENSIVE METABOLIC PANEL - Abnormal; Notable for the following components:   Glucose, Bld 220 (*)    Calcium 8.7 (*)    Total Protein 6.4 (*)    All other components within normal limits  CBC WITH DIFFERENTIAL/PLATELET - Abnormal; Notable for the following components:   WBC 17.0 (*)    Neutro Abs 12.7 (*)    All other components within normal limits  TROPONIN I (HIGH SENSITIVITY) - Abnormal; Notable for the following components:   Troponin I (High Sensitivity) 189 (*)    All other components within normal limits  TROPONIN I (HIGH SENSITIVITY) - Abnormal; Notable for the following components:   Troponin I (High Sensitivity) 94 (*)    All other components within normal limits  CULTURE, BLOOD (ROUTINE X 2)   CULTURE, BLOOD (ROUTINE X 2) W REFLEX TO ID PANEL  URINE CULTURE  LACTIC ACID, PLASMA  PROTIME-INR  APTT  RAPID URINE DRUG SCREEN, HOSP PERFORMED  HIV ANTIBODY (ROUTINE TESTING W REFLEX)  MAGNESIUM  PROCALCITONIN  TROPONIN I (HIGH SENSITIVITY)    EKG EKG Interpretation  Date/Time:  Wednesday April 05 2022 19:52:09 EDT Ventricular Rate:  108  PR Interval:  138 QRS Duration: 106 QT Interval:  383 QTC Calculation: 514 R Axis:   76 Text Interpretation: Sinus tachycardia Repol abnrm, severe global ischemia (LM/MVD) Prolonged QT interval morphology unchanged from september 2015 Confirmed by Kommor, Madison ([id number]) on 04/05/2022 9:51:26 PM  Radiology MR BRAIN WO CONTRAST  Result Date: 04/06/2022 EXAM: MRI HEAD WITHOUT CONTRAST TECHNIQUE: Multiplanar, multiecho pulse sequences of the brain and surrounding structures were obtained without intravenous contrast. COMPARISON:  Head CT April 05, 2022 FINDINGS: Incomplete study due to patient inability to lie still in the scanner for the duration of the procedure. Some of the images obtained are degraded by motion. Brain: No acute infarction, hemorrhage, hydrocephalus, extra-axial collection or mass lesion. Vascular: Normal flow voids. Skull and upper cervical spine: No focal marrow lesion identified. Sinuses/Orbits: Mild mucosal thickening scattered throughout the paranasal sinuses. The orbits are maintained. Other: None. IMPRESSION: 1. Incomplete study due to patient inability to lie still in the scanner for the duration of the procedure. Some of the images obtained are degraded by motion. 2. No acute intracranial abnormality identified. Electronically Signed   By: Pedro Earls M.D.   On: 04/06/2022 13:07   ECHOCARDIOGRAM COMPLETE  Result Date: 04/06/2022    ECHOCARDIOGRAM REPORT   Patient Name:   CANDACE RAMUS Date of Exam: 04/06/2022 Medical Rec #:  [id number]        Height:       75.0 in Accession #:    [id number]        Weight:       288.1 lb Date of Birth:  05/25/1976        BSA:          2.563 m Patient Age:    65 years         BP:           144/70 mmHg Patient Gender: M                HR:           82 bpm. Exam Location:  [facility] Procedure: 2D Echo, Cardiac Doppler, Color Doppler and Intracardiac            Opacification Agent Indications:    Elevated Troponin  History:        Patient has no prior history of Echocardiogram examinations.                 Signs/Symptoms:Syncope and Shortness of Breath; Risk                 Factors:Hypertension, Dyslipidemia and Former Smoker.  Sonographer:    Greer Pickerel Referring Phys: [id number] ASIA B [place]  Sonographer Comments: Technically difficult study due to poor echo windows, suboptimal parasternal window and suboptimal apical window. Image acquisition challenging due to respiratory motion and Image acquisition challenging due to patient body habitus. IMPRESSIONS  1. Left ventricular ejection fraction, by estimation, is 60 to 65%. The left ventricle has normal function. The left ventricle has no regional wall motion abnormalities. Left ventricular diastolic parameters were normal.  2. Right ventricular systolic function is normal. The right ventricular size is normal. Tricuspid regurgitation signal is inadequate for assessing PA pressure.  3. The mitral valve was not well visualized. No evidence of mitral valve regurgitation. No evidence of mitral stenosis.  4. The aortic valve was not well visualized. Aortic valve regurgitation is not visualized. No aortic stenosis is present.  5. The inferior vena cava is dilated  in size with >50% respiratory variability, suggesting right atrial pressure of 8 mmHg. FINDINGS  Left Ventricle: Left ventricular ejection fraction, by estimation, is 60 to 65%. The left ventricle has normal function. The left ventricle has no regional wall motion abnormalities. Definity contrast agent was given IV to delineate the left ventricular  endocardial borders.  The left ventricular internal cavity size was normal in size. There is no left ventricular hypertrophy. Left ventricular diastolic parameters were normal. Right Ventricle: The right ventricular size is normal. Right vetricular wall thickness was not well visualized. Right ventricular systolic function is normal. Tricuspid regurgitation signal is inadequate for assessing PA pressure. Left Atrium: Left atrial size was normal in size. Right Atrium: Right atrial size was normal in size. Pericardium: There is no evidence of pericardial effusion. Mitral Valve: The mitral valve was not well visualized. No evidence of mitral valve regurgitation. No evidence of mitral valve stenosis. Tricuspid Valve: The tricuspid valve is not well visualized. Tricuspid valve regurgitation is not demonstrated. No evidence of tricuspid stenosis. Aortic Valve: The aortic valve was not well visualized. Aortic valve regurgitation is not visualized. No aortic stenosis is present. Aortic valve mean gradient measures 6.1 mmHg. Aortic valve peak gradient measures 12.5 mmHg. Aortic valve area, by VTI measures 2.36 cm. Pulmonic Valve: The pulmonic valve was not well visualized. Pulmonic valve regurgitation is not visualized. No evidence of pulmonic stenosis. Aorta: The aortic root is normal in size and structure. Venous: The inferior vena cava is dilated in size with greater than 50% respiratory variability, suggesting right atrial pressure of 8 mmHg. IAS/Shunts: No atrial level shunt detected by color flow Doppler.  LEFT VENTRICLE PLAX 2D LV PW:         0.90 cm   Diastology LV IVS:        0.90 cm   LV e' medial:    9.46 cm/s LVOT diam:     2.20 cm   LV E/e' medial:  13.5 LV SV:         79        LV e' lateral:   11.30 cm/s LV SV Index:   31        LV E/e' lateral: 11.3 LVOT Area:     3.80 cm  RIGHT VENTRICLE RV S prime:     16.40 cm/s TAPSE (M-mode): 3.2 cm LEFT ATRIUM             Index        RIGHT ATRIUM           Index LA Vol (A2C):   47.5 ml  18.53 ml/m  RA Area:     22.10 cm LA Vol (A4C):   62.9 ml 24.54 ml/m  RA Volume:   72.00 ml  28.09 ml/m LA Biplane Vol: 58.6 ml 22.87 ml/m  AORTIC VALVE AV Area (Vmax):    2.49 cm AV Area (Vmean):   2.38 cm AV Area (VTI):     2.36 cm AV Vmax:           177.00 cm/s AV Vmean:          114.981 cm/s AV VTI:            0.336 m AV Peak Grad:      12.5 mmHg AV Mean Grad:      6.1 mmHg LVOT Vmax:         116.00 cm/s LVOT Vmean:        72.000 cm/s LVOT VTI:  0.209 m LVOT/AV VTI ratio: 0.62  AORTA Ao Root diam: 3.50 cm Ao Asc diam:  3.30 cm MITRAL VALVE MV Area (PHT): 4.39 cm     SHUNTS MV Decel Time: 173 msec     Systemic VTI:  0.21 m MV E velocity: 128.00 cm/s  Systemic Diam: 2.20 cm MV A velocity: 93.00 cm/s MV E/A ratio:  1.38 Dina Rich MD Electronically signed by Dina Rich MD Signature Date/Time: 04/06/2022/11:59:25 AM    Final    CT Angio Chest PE W and/or Wo Contrast  Result Date: 04/06/2022 CLINICAL DATA:  Found unresponsive and hypoxic EXAM: CT ANGIOGRAPHY CHEST CT ABDOMEN AND PELVIS WITH CONTRAST TECHNIQUE: Multidetector CT imaging of the chest was performed using the standard protocol during bolus administration of intravenous contrast. Multiplanar CT image reconstructions and MIPs were obtained to evaluate the vascular anatomy. Multidetector CT imaging of the abdomen and pelvis was performed using the standard protocol during bolus administration of intravenous contrast. RADIATION DOSE REDUCTION: This exam was performed according to the departmental dose-optimization program which includes automated exposure control, adjustment of the mA and/or kV according to patient size and/or use of iterative reconstruction technique. CONTRAST:  OMNIPAQUE IOHEXOL 350 MG/ML SOLN COMPARISON:  Chest x-ray from earlier in the same day, CT from 03/03/2021. FINDINGS: CTA CHEST FINDINGS Cardiovascular: Thoracic aorta is within normal limits without aneurysmal dilatation or dissection. Heart is  mildly enlarged in size. The pulmonary artery shows a normal branching pattern bilaterally. Some motion artifact is noted although no filling defect to suggest pulmonary embolism is seen. Mild coronary calcifications are noted. Mediastinum/Nodes: Thoracic inlet is within normal limits. Small mediastinal nodes are seen but not significant by size criteria. The esophagus is within normal limits. Lungs/Pleura: Lungs are well aerated bilaterally. No focal infiltrate or sizable effusion is seen. No pneumothorax is noted. Musculoskeletal: Degenerative changes of the thoracic spine are seen. No acute rib abnormality is noted. Review of the MIP images confirms the above findings. CT ABDOMEN and PELVIS FINDINGS Hepatobiliary: Gallbladder is decompressed. Liver appears within normal limits. Pancreas: Unremarkable. No pancreatic ductal dilatation or surrounding inflammatory changes. Spleen: Normal in size without focal abnormality. Adrenals/Urinary Tract: Adrenal glands demonstrate bilateral calcifications similar to that seen on prior exam. No focal lesion is noted. The kidneys demonstrate a normal enhancement pattern bilaterally. No renal calculi are identified. A 1.5 cm hypodensity is noted in the left kidney stable from the prior exam consistent with cyst. No further follow-up is recommended. The bladder is partially distended. Stomach/Bowel: Scattered fecal material is noted throughout the colon. Mild diverticular change is seen. No obstructive changes are noted. The appendix is within normal limits. Small bowel and stomach are unremarkable. Vascular/Lymphatic: Aortic atherosclerosis. No enlarged abdominal or pelvic lymph nodes. Reproductive: Prostate is unremarkable. Other: No abdominal wall hernia or abnormality. No abdominopelvic ascites. Musculoskeletal: No acute or significant osseous findings. Review of the MIP images confirms the above findings. IMPRESSION: CTA of the chest: No evidence of pulmonary embolism. CT of  the abdomen and pelvis: No acute abnormality noted to correspond with the given clinical history. Electronically Signed   By: Alcide Clever M.D.   On: 04/06/2022 00:13   CT ABDOMEN PELVIS W CONTRAST  Result Date: 04/06/2022 CLINICAL DATA:  Found unresponsive and hypoxic EXAM: CT ANGIOGRAPHY CHEST CT ABDOMEN AND PELVIS WITH CONTRAST TECHNIQUE: Multidetector CT imaging of the chest was performed using the standard protocol during bolus administration of intravenous contrast. Multiplanar CT image reconstructions and MIPs were obtained to evaluate  the vascular anatomy. Multidetector CT imaging of the abdomen and pelvis was performed using the standard protocol during bolus administration of intravenous contrast. RADIATION DOSE REDUCTION: This exam was performed according to the departmental dose-optimization program which includes automated exposure control, adjustment of the mA and/or kV according to patient size and/or use of iterative reconstruction technique. CONTRAST:  OMNIPAQUE IOHEXOL 350 MG/ML SOLN COMPARISON:  Chest x-ray from earlier in the same day, CT from 03/03/2021. FINDINGS: CTA CHEST FINDINGS Cardiovascular: Thoracic aorta is within normal limits without aneurysmal dilatation or dissection. Heart is mildly enlarged in size. The pulmonary artery shows a normal branching pattern bilaterally. Some motion artifact is noted although no filling defect to suggest pulmonary embolism is seen. Mild coronary calcifications are noted. Mediastinum/Nodes: Thoracic inlet is within normal limits. Small mediastinal nodes are seen but not significant by size criteria. The esophagus is within normal limits. Lungs/Pleura: Lungs are well aerated bilaterally. No focal infiltrate or sizable effusion is seen. No pneumothorax is noted. Musculoskeletal: Degenerative changes of the thoracic spine are seen. No acute rib abnormality is noted. Review of the MIP images confirms the above findings. CT ABDOMEN and PELVIS  FINDINGS Hepatobiliary: Gallbladder is decompressed. Liver appears within normal limits. Pancreas: Unremarkable. No pancreatic ductal dilatation or surrounding inflammatory changes. Spleen: Normal in size without focal abnormality. Adrenals/Urinary Tract: Adrenal glands demonstrate bilateral calcifications similar to that seen on prior exam. No focal lesion is noted. The kidneys demonstrate a normal enhancement pattern bilaterally. No renal calculi are identified. A 1.5 cm hypodensity is noted in the left kidney stable from the prior exam consistent with cyst. No further follow-up is recommended. The bladder is partially distended. Stomach/Bowel: Scattered fecal material is noted throughout the colon. Mild diverticular change is seen. No obstructive changes are noted. The appendix is within normal limits. Small bowel and stomach are unremarkable. Vascular/Lymphatic: Aortic atherosclerosis. No enlarged abdominal or pelvic lymph nodes. Reproductive: Prostate is unremarkable. Other: No abdominal wall hernia or abnormality. No abdominopelvic ascites. Musculoskeletal: No acute or significant osseous findings. Review of the MIP images confirms the above findings. IMPRESSION: CTA of the chest: No evidence of pulmonary embolism. CT of the abdomen and pelvis: No acute abnormality noted to correspond with the given clinical history. Electronically Signed   By: Alcide Clever M.D.   On: 04/06/2022 00:13   CT Head Wo Contrast  Result Date: 04/06/2022 CLINICAL DATA:  Syncope or presyncope, cerebrovascular cause suspected. Patient was unresponsive and hypoxic. EXAM: CT HEAD WITHOUT CONTRAST TECHNIQUE: Contiguous axial images were obtained from the base of the skull through the vertex without intravenous contrast. RADIATION DOSE REDUCTION: This exam was performed according to the departmental dose-optimization program which includes automated exposure control, adjustment of the mA and/or kV according to patient size and/or use  of iterative reconstruction technique. COMPARISON:  01/18/2014 FINDINGS: Brain: No evidence of acute infarction, hemorrhage, hydrocephalus, extra-axial collection or mass lesion/mass effect. Vascular: No hyperdense vessel or unexpected calcification. Skull: Normal. Negative for fracture or focal lesion. Sinuses/Orbits: No acute finding. Other: None. IMPRESSION: No acute intracranial abnormalities. Electronically Signed   By: Burman Nieves M.D.   On: 04/06/2022 00:10   DG Chest Port 1 View  Result Date: 04/05/2022 CLINICAL DATA:  Sepsis EXAM: PORTABLE CHEST 1 VIEW COMPARISON:  11/18/2020 FINDINGS: Two frontal views of the chest demonstrate an unremarkable cardiac silhouette. No airspace disease, effusion, or pneumothorax. No acute bony abnormalities. IMPRESSION: 1. No acute intrathoracic process. Electronically Signed   By: Maxwell Caul.D.  On: 04/05/2022 20:26    Procedures .Critical Care  Performed by: Olene Floss, PA-C Authorized by: Olene Floss, PA-C   Critical care provider statement:    Critical care time (minutes):  35   Critical care was necessary to treat or prevent imminent or life-threatening deterioration of the following conditions:  Respiratory failure   Critical care was time spent personally by me on the following activities:  Development of treatment plan with patient or surrogate, discussions with consultants, evaluation of patient's response to treatment, examination of patient, ordering and review of laboratory studies, ordering and review of radiographic studies, ordering and performing treatments and interventions, pulse oximetry, re-evaluation of patient's condition and review of old charts   Care discussed with: admitting provider       Medications Ordered in ED Medications  amLODipine (NORVASC) tablet 5 mg (5 mg Oral Given 04/06/22 0941)  rosuvastatin (CRESTOR) tablet 5 mg (5 mg Oral Given 04/06/22 0948)  famotidine (PEPCID) tablet 20 mg (20  mg Oral Given 04/06/22 0940)  heparin injection 5,000 Units (5,000 Units Subcutaneous Patient Refused/Not Given 04/06/22 1308)  0.9 %  sodium chloride infusion ( Intravenous Infusion Verify 04/06/22 0411)  acetaminophen (TYLENOL) tablet 650 mg (has no administration in time range)    Or  acetaminophen (TYLENOL) suppository 650 mg (has no administration in time range)  oxyCODONE (Oxy IR/ROXICODONE) immediate release tablet 5 mg (has no administration in time range)  ondansetron (ZOFRAN) tablet 4 mg (has no administration in time range)    Or  ondansetron (ZOFRAN) injection 4 mg (has no administration in time range)  perflutren lipid microspheres (DEFINITY) IV suspension (6 mLs Intravenous Given 04/06/22 1112)  naloxone (NARCAN) injection 1 mg ( Intravenous Duplicate 04/06/22 0254)  sodium chloride 0.9 % bolus 1,000 mL (0 mLs Intravenous Stopped 04/05/22 2252)  potassium chloride SA (KLOR-CON M) CR tablet 40 mEq (40 mEq Oral Given 04/05/22 2215)  magnesium sulfate IVPB 2 g 50 mL (0 g Intravenous Stopped 04/05/22 2318)  iohexol (OMNIPAQUE) 350 MG/ML injection 100 mL (100 mLs Intravenous Contrast Given 04/05/22 2343)  aspirin chewable tablet 324 mg (324 mg Oral Given 04/05/22 2318)    ED Course/ Medical Decision Making/ A&P                           Medical Decision Making Amount and/or Complexity of Data Reviewed Labs: ordered. Radiology: ordered. ECG/medicine tests: ordered.  Risk OTC drugs. Prescription drug management. Decision regarding hospitalization.  This patient is a 46 y.o. male who presents to the ED for concern of unresponsive episodes with hypoxia,, this involves an extensive number of treatment options, and is a complaint that carries with it a high risk of complications and morbidity. The emergent differential diagnosis prior to evaluation includes, but is not limited to, opioid overdose was the most suspected initial diagnosis given patient's presentation, and response  to Narcan on the scene, however he vehemently denies drug use, considered other respiratory abnormality, ACS, PE, cardiogenic or neurogenic syncope, panic attack, allergic reaction, or other cause of acute hypoxic respiratory failure and altered level of awareness.   This is not an exhaustive differential.   Past Medical History / Co-morbidities / Social History: Remote history of previous alcohol abuse, drug use, currently hypertension, acid reflux, hyperlipidemia  Additional history: Chart reviewed. Pertinent results include: Extensively reviewed lab work, imaging from previous emergency department visits, attempted to review old cardiology procedures, no record on file from patient's previous  heart catheterization other than a hand-drawn image of his arterial distribution  Physical Exam: Physical exam performed. The pertinent findings include: Somewhat agitated, and difficult to calm down on arrival, with some tachycardia, after fluid bolus, and rest he has improved, answers questions appropriately, return to normal vital signs.  On arrival he is requiring oxygen but is able to be titrated down to room air without difficulty after a few hours.  Lab Tests: I ordered, and personally interpreted labs.  The pertinent results include: CBC with mild leukocytosis, white blood cells 11.2, CMP notable for elevated creatinine at 1.31, moderate hyperglycemia glucose 177, otherwise unremarkable, UA overall unremarkable, some protein noted, his initial troponin is normal, but second is elevated at 189, fever likely demand ischemia from his hypoxic episode, but cannot rule out other ACS.  His initial lactic acid is significantly elevated at 4.7 but improved to 1.6 after fluid bolus x1 his UDS is negative, however notably her UDS does not test for fentanyl.   Imaging Studies: I ordered imaging studies including plain film chest x-ray. I independently visualized and interpreted imaging which showed no acute  intrathoracic abnormality. I agree with the radiologist interpretation.  Based on his undifferentiated cause of unresponsiveness, hypoxia will order CT PE study, abdomen pelvis with contrast, CT head without contrast for undifferentiated sepsis presentation   Cardiac Monitoring:  The patient was maintained on a cardiac monitor.  My attending physician Dr. Posey Rea viewed and interpreted the cardiac monitored which showed an underlying rhythm of: sinus tachycardia, prolonged QT. I agree with this interpretation.   Medications: I ordered medication including fluid bolus, potassium chloride, magnesium, aspirin for questionable NSTEMI presentation, questionable opioid overdose, dehydration. Reevaluation of the patient after these medicines showed that the patient improved. I have reviewed the patients home medicines and have made adjustments as needed.  1:18 PM Care of VEDDER BRITTIAN transferred to Dr. Blinda Leatherwood at the end of my shift as the patient will require reassessment once labs/imaging have resulted. Patient presentation, ED course, and plan of care discussed with review of all pertinent labs and imaging. Please see his/her note for further details regarding further ED course and disposition. Plan at time of handoff is admit pending imaging and further evaluation for NSTEMI, although I still have some suspicion of fentanyl overdose, if this event cannot be blamed on an opioid overdose certainly worry about cardiogenic or neurogenic syncope, and recommend at the very least observation and further investigation in an inpatient setting. This may be altered or completely changed at the discretion of the oncoming team pending results of further workup.   Final Clinical Impression(s) / ED Diagnoses Final diagnoses:  None    Rx / DC Orders ED Discharge Orders     None         [place] 04/06/22 1318    Kommor, Rosalia, MD 04/09/22 [phone]

## 2022-04-06 ENCOUNTER — Observation Stay (HOSPITAL_COMMUNITY): Payer: 59

## 2022-04-06 ENCOUNTER — Encounter (HOSPITAL_COMMUNITY): Payer: Self-pay | Admitting: Family Medicine

## 2022-04-06 ENCOUNTER — Telehealth: Payer: Self-pay | Admitting: *Deleted

## 2022-04-06 ENCOUNTER — Observation Stay (HOSPITAL_COMMUNITY)
Admit: 2022-04-06 | Discharge: 2022-04-06 | Disposition: A | Discharge status: Home / Self Care | Payer: 59 | Attending: Neurology | Admitting: Neurology

## 2022-04-06 ENCOUNTER — Other Ambulatory Visit: Payer: Self-pay

## 2022-04-06 ENCOUNTER — Observation Stay (HOSPITAL_BASED_OUTPATIENT_CLINIC_OR_DEPARTMENT_OTHER): Payer: 59

## 2022-04-06 DIAGNOSIS — I10 Essential (primary) hypertension: Secondary | ICD-10-CM

## 2022-04-06 DIAGNOSIS — R55 Syncope and collapse: Secondary | ICD-10-CM | POA: Diagnosis not present

## 2022-04-06 DIAGNOSIS — K219 Gastro-esophageal reflux disease without esophagitis: Secondary | ICD-10-CM

## 2022-04-06 DIAGNOSIS — I6523 Occlusion and stenosis of bilateral carotid arteries: Secondary | ICD-10-CM | POA: Diagnosis not present

## 2022-04-06 DIAGNOSIS — R7989 Other specified abnormal findings of blood chemistry: Secondary | ICD-10-CM

## 2022-04-06 DIAGNOSIS — E785 Hyperlipidemia, unspecified: Secondary | ICD-10-CM

## 2022-04-06 DIAGNOSIS — R778 Other specified abnormalities of plasma proteins: Secondary | ICD-10-CM

## 2022-04-06 DIAGNOSIS — J3489 Other specified disorders of nose and nasal sinuses: Secondary | ICD-10-CM | POA: Diagnosis not present

## 2022-04-06 DIAGNOSIS — R739 Hyperglycemia, unspecified: Secondary | ICD-10-CM

## 2022-04-06 DIAGNOSIS — N179 Acute kidney failure, unspecified: Secondary | ICD-10-CM | POA: Diagnosis not present

## 2022-04-06 LAB — BLOOD CULTURE ID PANEL (REFLEXED) - BCID2

## 2022-04-06 LAB — COMPREHENSIVE METABOLIC PANEL
ALT: 33 U/L (ref 0–44)
AST: 23 U/L (ref 15–41)
Albumin: 3.5 g/dL (ref 3.5–5.0)
Alkaline Phosphatase: 88 U/L (ref 38–126)
Anion gap: 8 (ref 5–15)
BUN: 20 mg/dL (ref 6–20)
CO2: 22 mmol/L (ref 22–32)
Calcium: 8.7 mg/dL — ABNORMAL LOW (ref 8.9–10.3)
Chloride: 108 mmol/L (ref 98–111)
Creatinine, Ser: 0.95 mg/dL (ref 0.61–1.24)
GFR, Estimated: >60 mL/min (ref >60)
Glucose, Bld: 220 mg/dL — ABNORMAL HIGH (ref 70–99)
Potassium: 4.2 mmol/L (ref 3.5–5.1)
Sodium: 138 mmol/L (ref 135–145)
Total Bilirubin: 0.4 mg/dL (ref 0.3–1.2)
Total Protein: 6.4 g/dL — ABNORMAL LOW (ref 6.5–8.1)

## 2022-04-06 LAB — HEMOGLOBIN A1C
Hgb A1c MFr Bld: 5.9 % — ABNORMAL HIGH (ref 4.8–5.6)
Mean Plasma Glucose: 122.63 mg/dL

## 2022-04-06 LAB — CBC WITH DIFFERENTIAL/PLATELET
Abs Immature Granulocytes: 0.07 10*3/uL (ref 0.00–0.07)
Basophils Absolute: 0 10*3/uL (ref 0.0–0.1)
Basophils Relative: 0 %
Eosinophils Absolute: 0.2 10*3/uL (ref 0.0–0.5)
Eosinophils Relative: 1 %
HCT: 41.2 % (ref 39.0–52.0)
Hemoglobin: 14.1 g/dL (ref 13.0–17.0)
Immature Granulocytes: 0 %
Lymphocytes Relative: 19 %
Lymphs Abs: 3.2 10*3/uL (ref 0.7–4.0)
MCH: 30.9 pg (ref 26.0–34.0)
MCHC: 34.2 g/dL (ref 30.0–36.0)
MCV: 90.4 fL (ref 80.0–100.0)
Monocytes Absolute: 0.9 10*3/uL (ref 0.1–1.0)
Monocytes Relative: 5 %
Neutro Abs: 12.7 10*3/uL — ABNORMAL HIGH (ref 1.7–7.7)
Neutrophils Relative %: 75 %
Platelets: 263 10*3/uL (ref 150–400)
RBC: 4.56 MIL/uL (ref 4.22–5.81)
RDW: 14.6 % (ref 11.5–15.5)
WBC: 17 10*3/uL — ABNORMAL HIGH (ref 4.0–10.5)
nRBC: 0 % (ref 0.0–0.2)

## 2022-04-06 LAB — ECHOCARDIOGRAM COMPLETE
AR max vel: 2.49 cm2
AV Area VTI: 2.36 cm2
AV Area mean vel: 2.38 cm2
AV Mean grad: 6.1 mmHg
AV Peak grad: 12.5 mmHg
Ao pk vel: 1.77 m/s
Area-P 1/2: 4.39 cm2
Height: 75 in
Weight: 4610.26 oz

## 2022-04-06 LAB — PROCALCITONIN: Procalcitonin: 1.67 ng/mL

## 2022-04-06 LAB — MAGNESIUM: Magnesium: 2.4 mg/dL (ref 1.7–2.4)

## 2022-04-06 LAB — HIV ANTIBODY (ROUTINE TESTING W REFLEX): HIV Screen 4th Generation wRfx: NONREACTIVE

## 2022-04-06 LAB — TROPONIN I (HIGH SENSITIVITY): Troponin I (High Sensitivity): 94 ng/L — ABNORMAL HIGH (ref <18)

## 2022-04-06 MED ORDER — AMLODIPINE BESYLATE 5 MG PO TABS
5.0000 mg | ORAL_TABLET | Freq: Every day | ORAL | Status: DC
  Administered 2022-04-06 – 2022-04-07 (×2): 5 mg via ORAL
  Filled 2022-04-06 (×2): qty 1

## 2022-04-06 MED ORDER — SODIUM CHLORIDE 0.9 % IV SOLN: INTRAVENOUS | Status: DC

## 2022-04-06 MED ORDER — FAMOTIDINE 20 MG PO TABS
20.0000 mg | ORAL_TABLET | Freq: Two times a day (BID) | ORAL | Status: DC
  Administered 2022-04-06 – 2022-04-07 (×4): 20 mg via ORAL
  Filled 2022-04-06 (×4): qty 1

## 2022-04-06 MED ORDER — HYDRALAZINE HCL 20 MG/ML IJ SOLN
10.0000 mg | INTRAMUSCULAR | Status: DC | PRN
  Administered 2022-04-06: 10 mg via INTRAVENOUS
  Filled 2022-04-06: qty 1

## 2022-04-06 MED ORDER — ACETAMINOPHEN 650 MG RE SUPP: 650.0000 mg | Freq: Four times a day (QID) | RECTAL | Status: DC | PRN

## 2022-04-06 MED ORDER — ONDANSETRON HCL 4 MG PO TABS: 4.0000 mg | ORAL_TABLET | Freq: Four times a day (QID) | ORAL | Status: DC | PRN

## 2022-04-06 MED ORDER — ONDANSETRON HCL 4 MG/2ML IJ SOLN: 4.0000 mg | Freq: Four times a day (QID) | INTRAMUSCULAR | Status: DC | PRN

## 2022-04-06 MED ORDER — ACETAMINOPHEN 325 MG PO TABS: 650.0000 mg | ORAL_TABLET | Freq: Four times a day (QID) | ORAL | Status: DC | PRN

## 2022-04-06 MED ORDER — HEPARIN SODIUM (PORCINE) 5000 UNIT/ML IJ SOLN: 5000.0000 [IU] | Freq: Three times a day (TID) | INTRAMUSCULAR | Status: DC

## 2022-04-06 MED ORDER — ROSUVASTATIN CALCIUM 10 MG PO TABS
5.0000 mg | ORAL_TABLET | Freq: Every day | ORAL | Status: DC
  Administered 2022-04-06 – 2022-04-07 (×2): 5 mg via ORAL
  Filled 2022-04-06 (×2): qty 1

## 2022-04-06 MED ORDER — PERFLUTREN LIPID MICROSPHERE
1.0000 mL | INTRAVENOUS | Status: AC | PRN
  Administered 2022-04-06: 6 mL via INTRAVENOUS

## 2022-04-06 MED ORDER — RIVAROXABAN 10 MG PO TABS
10.0000 mg | ORAL_TABLET | Freq: Every day | ORAL | Status: DC
  Administered 2022-04-06: 10 mg via ORAL
  Filled 2022-04-06: qty 1

## 2022-04-06 MED ORDER — OXYCODONE HCL 5 MG PO TABS: 5.0000 mg | ORAL_TABLET | ORAL | Status: DC | PRN

## 2022-04-06 NOTE — Progress Notes (Signed)
Date and time results received: 04/06/22 1430 (use smartphrase ".now" to insert current time)  Test: blood culture -aerobic  Critical Value: positive for growing gram positive cocci  Name of Provider Notified: Dr. Wynetta Emery   Orders Received? Or Actions Taken?:

## 2022-04-06 NOTE — Assessment & Plan Note (Signed)
-   Creatinine at baseline 0.8, creatinine today 1.31 - Patient did note hypotension with EMS - Patient also had lactic acidosis 4.7 - Lactic improved to 1.6 - Patient did receive 1 L normal saline - Creatinine has also likely improved by now as patient has been back to baseline - Trend with morning labs

## 2022-04-06 NOTE — Assessment & Plan Note (Signed)
-   No known history of diabetes - Check hemoglobin A1c

## 2022-04-06 NOTE — Progress Notes (Signed)
ASSUMPTION OF CARE NOTE   04/06/2022 2:39 PM  Nicolas [name] was seen and examined.  The H&P by the admitting provider, orders, imaging was reviewed.  Please see new orders.  Will continue to follow.   Vitals:   04/06/22 0640 04/06/22 1353  BP:  (!) 170/91  Pulse:  93  Resp:  20  Temp:  98.5 F (36.9 C)  SpO2: 98% 99%    Results for orders placed or performed during the hospital encounter of 04/05/22  Blood Culture (routine x 2)   Specimen: BLOOD LEFT HAND  Result Value Ref Range   Specimen Description BLOOD LEFT HAND    Special Requests      BOTTLES DRAWN AEROBIC AND ANAEROBIC Blood Culture adequate volume   Culture  Setup Time      GRAM POSITIVE COCCI AEROBIC BOTTLE ONLY Gram Stain Report Called to,Read Back By and Verified With: RAY,ANGEL 04/06/22 @1429  BY SSL Performed at [facility], [address] [place], [place] [address]    Culture PENDING    Report Status PENDING   Culture, blood (Routine X 2) w Reflex to ID Panel   Specimen: Left Antecubital  Result Value Ref Range   Specimen Description LEFT ANTECUBITAL    Special Requests      BOTTLES DRAWN AEROBIC AND ANAEROBIC Blood Culture adequate volume   Culture      NO GROWTH < 12 HOURS Performed at [facility], [address], [place], [place] [address]    Report Status PENDING   Lactic acid, plasma  Result Value Ref Range   Lactic Acid, Venous 4.7 (HH) 0.5 - 1.9 mmol/L  Lactic acid, plasma  Result Value Ref Range   Lactic Acid, Venous 1.6 0.5 - 1.9 mmol/L  Comprehensive metabolic panel  Result Value Ref Range   Sodium 141 135 - 145 mmol/L   Potassium 3.5 3.5 - 5.1 mmol/L   Chloride 103 98 - 111 mmol/L   CO2 26 22 - 32 mmol/L   Glucose, Bld 177 (H) 70 - 99 mg/dL   BUN 21 (H) 6 - 20 mg/dL   Creatinine, Ser 1.31 (H) 0.61 - 1.24 mg/dL   Calcium 9.1 8.9 - 10.3 mg/dL   Total Protein 6.9 6.5 - 8.1 g/dL   Albumin 4.0 3.5 - 5.0 g/dL   AST 32 15 - 41 U/L   ALT 37 0 - 44 U/L   Alkaline Phosphatase 95 38 - 126  U/L   Total Bilirubin 0.8 0.3 - 1.2 mg/dL   GFR, Estimated >60 >60 mL/min   Anion gap 12 5 - 15  CBC with Differential  Result Value Ref Range   WBC 11.2 (H) 4.0 - 10.5 K/uL   RBC 5.03 4.22 - 5.81 MIL/uL   Hemoglobin 15.5 13.0 - 17.0 g/dL   HCT 44.7 39.0 - 52.0 %   MCV 88.9 80.0 - 100.0 fL   MCH 30.8 26.0 - 34.0 pg   MCHC 34.7 30.0 - 36.0 g/dL   RDW 14.8 11.5 - 15.5 %   Platelets 320 150 - 400 K/uL   nRBC 0.0 0.0 - 0.2 %   Neutrophils Relative % 42 %   Neutro Abs 4.7 1.7 - 7.7 K/uL   Lymphocytes Relative 51 %   Lymphs Abs 5.7 (H) 0.7 - 4.0 K/uL   Monocytes Relative 4 %   Monocytes Absolute 0.4 0.1 - 1.0 K/uL   Eosinophils Relative 1 %   Eosinophils Absolute 0.1 0.0 - 0.5 K/uL   Basophils  Relative 1 %   Basophils Absolute 0.1 0.0 - 0.1 K/uL   RBC Morphology MORPHOLOGY UNREMARKABLE    Smear Review MORPHOLOGY UNREMARKABLE    Myelocytes 1 %   Abs Immature Granulocytes 0.10 (H) 0.00 - 0.07 K/uL   Reactive, Benign Lymphocytes PRESENT   Protime-INR  Result Value Ref Range   Prothrombin Time 13.8 11.4 - 15.2 seconds   INR 1.1 0.8 - 1.2  APTT  Result Value Ref Range   aPTT 30 24 - 36 seconds  Urinalysis, Routine w reflex microscopic  Result Value Ref Range   Color, Urine YELLOW YELLOW   APPearance CLOUDY (A) CLEAR   Specific Gravity, Urine 1.013 1.005 - 1.030   pH 7.0 5.0 - 8.0   Glucose, UA NEGATIVE NEGATIVE mg/dL   Hgb urine dipstick NEGATIVE NEGATIVE   Bilirubin Urine NEGATIVE NEGATIVE   Ketones, ur NEGATIVE NEGATIVE mg/dL   Protein, ur 071 (A) NEGATIVE mg/dL   Nitrite NEGATIVE NEGATIVE   Leukocytes,Ua NEGATIVE NEGATIVE   RBC / HPF 0-5 0 - 5 RBC/hpf   WBC, UA 0-5 0 - 5 WBC/hpf   Bacteria, UA RARE (A) NONE SEEN   Squamous Epithelial / LPF 0-5 0 - 5   Mucus PRESENT    Amorphous Crystal PRESENT   Blood gas, venous (at [facility] and [facility], not at [facility])  Result Value Ref Range   pH, Ven 7.46 (H) 7.25 - 7.43   pCO2, Ven 43 (L) 44 - 60 mmHg   pO2, Ven 69 (H) 32 - 45 mmHg    Bicarbonate 31.3 (H) 20.0 - 28.0 mmol/L   Acid-Base Excess 6.5 (H) 0.0 - 2.0 mmol/L   O2 Saturation 96 %   Patient temperature 36.7    Collection site BLOOD LEFT HAND    Drawn by 2197   Rapid urine drug screen (hospital performed)  Result Value Ref Range   Opiates NONE DETECTED NONE DETECTED   Cocaine NONE DETECTED NONE DETECTED   Benzodiazepines NONE DETECTED NONE DETECTED   Amphetamines NONE DETECTED NONE DETECTED   Tetrahydrocannabinol NONE DETECTED NONE DETECTED   Barbiturates NONE DETECTED NONE DETECTED  Hemoglobin A1c  Result Value Ref Range   Hgb A1c MFr Bld 5.9 (H) 4.8 - 5.6 %   Mean Plasma Glucose 122.63 mg/dL  HIV Antibody (routine testing w rflx)  Result Value Ref Range   HIV Screen 4th Generation wRfx Non Reactive Non Reactive  Comprehensive metabolic panel  Result Value Ref Range   Sodium 138 135 - 145 mmol/L   Potassium 4.2 3.5 - 5.1 mmol/L   Chloride 108 98 - 111 mmol/L   CO2 22 22 - 32 mmol/L   Glucose, Bld 220 (H) 70 - 99 mg/dL   BUN 20 6 - 20 mg/dL   Creatinine, Ser 5.88 0.61 - 1.24 mg/dL   Calcium 8.7 (L) 8.9 - 10.3 mg/dL   Total Protein 6.4 (L) 6.5 - 8.1 g/dL   Albumin 3.5 3.5 - 5.0 g/dL   AST 23 15 - 41 U/L   ALT 33 0 - 44 U/L   Alkaline Phosphatase 88 38 - 126 U/L   Total Bilirubin 0.4 0.3 - 1.2 mg/dL   GFR, Estimated >32 >54 mL/min   Anion gap 8 5 - 15  Magnesium  Result Value Ref Range   Magnesium 2.4 1.7 - 2.4 mg/dL  CBC with Differential/Platelet  Result Value Ref Range   WBC 17.0 (H) 4.0 - 10.5 K/uL   RBC 4.56 4.22 - 5.81 MIL/uL  Hemoglobin 14.1 13.0 - 17.0 g/dL   HCT 44.0 34.7 - 42.5 %   MCV 90.4 80.0 - 100.0 fL   MCH 30.9 26.0 - 34.0 pg   MCHC 34.2 30.0 - 36.0 g/dL   RDW 95.6 38.7 - 56.4 %   Platelets 263 150 - 400 K/uL   nRBC 0.0 0.0 - 0.2 %   Neutrophils Relative % 75 %   Neutro Abs 12.7 (H) 1.7 - 7.7 K/uL   Lymphocytes Relative 19 %   Lymphs Abs 3.2 0.7 - 4.0 K/uL   Monocytes Relative 5 %   Monocytes Absolute 0.9 0.1 - 1.0  K/uL   Eosinophils Relative 1 %   Eosinophils Absolute 0.2 0.0 - 0.5 K/uL   Basophils Relative 0 %   Basophils Absolute 0.0 0.0 - 0.1 K/uL   Immature Granulocytes 0 %   Abs Immature Granulocytes 0.07 0.00 - 0.07 K/uL  Procalcitonin - Baseline  Result Value Ref Range   Procalcitonin 1.67 ng/mL  ECHOCARDIOGRAM COMPLETE  Result Value Ref Range   Weight 4,610.26 oz   Height 75 in   BP 144/77 mmHg   Area-P 1/2 4.39 cm2   AR max vel 2.49 cm2   AV Area mean vel 2.38 cm2   AV Area VTI 2.36 cm2   AV Peak grad 12.5 mmHg   Ao pk vel 1.77 m/s   AV Mean grad 6.1 mmHg  Troponin I (High Sensitivity)  Result Value Ref Range   Troponin I (High Sensitivity) 8 <18 ng/L  Troponin I (High Sensitivity)  Result Value Ref Range   Troponin I (High Sensitivity) 189 (HH) <18 ng/L  Troponin I (High Sensitivity)  Result Value Ref Range   Troponin I (High Sensitivity) 94 (H) <18 ng/L     C. Laural Benes, MD [facility]   04/05/2022  7:52 PM How to contact the [facility] Attending or Consulting provider 7A - 7P or covering provider during after hours 7P -7A, for this patient?  Check the care team in [facility] and look for a) attending/consulting [facility] provider listed and b) the [facility] team listed Log into [email or web address] and use 's universal password to access. If you do not have the password, please contact the hospital operator. Locate the [facility] provider you are looking for under [facility] and page to a number that you can be directly reached. If you still have difficulty reaching the provider, please page the [facility] (Director on Call) for the Hospitalists listed on amion for assistance.

## 2022-04-06 NOTE — Assessment & Plan Note (Signed)
-   Likely demand ischemia in the setting of hypoxia - No chest pain - Patient does report occasional palpitations - Echo in the a.m. - Trend troponin with a.m. labs - Cardiology consulted from ED and did not believe that further cardiology work-up needed to be done - Continue monitor

## 2022-04-06 NOTE — Assessment & Plan Note (Signed)
Continue statin. 

## 2022-04-06 NOTE — Assessment & Plan Note (Signed)
Continue Pepcid  

## 2022-04-06 NOTE — Procedures (Signed)
Patient Name: Nicolas [name]  MRN: [id number]  Epilepsy Attending: Lora Havens  Referring Physician/Provider: Rolla Plate, DO  Date: 04/06/2022 Duration: 22.56 mins  Patient history: 46 year old male with syncope.  EEG to evaluate for seizure.  Level of alertness: Awake  AEDs during EEG study: None  Technical aspects: This EEG study was done with scalp electrodes positioned according to the 10-20 International system of electrode placement. Electrical activity was reviewed with band pass filter of 1-70Hz , sensitivity of 7 uV/mm, display speed of 64mm/sec with a 60Hz  notched filter applied as appropriate. EEG data were recorded continuously and digitally stored.  Video monitoring was available and reviewed as appropriate.  Description: Majority of the study was difficult to interpret du to significant movement and electrode artifact. After filtering for artifacts, briefly posterior dominant rhythm of 10-11Hz  activity of moderate voltage (25-35 uV) was seen predominantly in posterior head regions, symmetric and reactive to eye opening and eye closing.  Hyperventilation and photic stimulation were not performed.     IMPRESSION: This technically difficult study is within normal limits.  No seizures or epileptiform discharges were seen throughout the recording.  A normal interictal EEG does not exclude the diagnosis of epilepsy. If suspicion for interictal activity remains a concern, a prolonged study can be considered.   Nicolas [name]

## 2022-04-06 NOTE — Progress Notes (Signed)
EEG complete - results pending 

## 2022-04-06 NOTE — Telephone Encounter (Signed)
Instant msg from Dr. Wynetta Emery requesting a 30 day event monitor for syncope. Pt enrolled in preventice and order placed.

## 2022-04-06 NOTE — Progress Notes (Signed)
EEG tech at bedside to perform study.

## 2022-04-06 NOTE — Discharge Instructions (Signed)
They will be mailing you a 30 day cardiac monitor to your home to wear for 30 days as part of the work up.  Please wear the 30 day monitor for 30 days and it will be read by the cardiologist.     Seizure precautions: Per [facility] statutes, patients with seizures are not allowed to drive until they have been seizure-free for six months and cleared by a physician    Use caution when using heavy equipment or power tools. Avoid working on ladders or at heights. Take showers instead of baths. Ensure the water temperature is not too high on the home water heater. Do not go swimming alone. Do not lock yourself in a room alone (i.e. bathroom). When caring for infants or small children, sit down when holding, feeding, or changing them to minimize risk of injury to the child in the event you have a seizure. Maintain good sleep hygiene. Avoid alcohol.    If patient has another seizure, call 911 and bring them back to the ED if: A.  The seizure lasts longer than 5 minutes.      B.  The patient doesn't wake shortly after the seizure or has new problems such as difficulty seeing, speaking or moving following the seizure C.  The patient was injured during the seizure D.  The patient has a temperature over 102 F (39C) E.  The patient vomited during the seizure and now is having trouble breathing    During the Seizure   - First, ensure adequate ventilation and place patients on the floor on their left side  Loosen clothing around the neck and ensure the airway is patent. If the patient is clenching the teeth, do not force the mouth open with any object as this can cause severe damage - Remove all items from the surrounding that can be hazardous. The patient may be oblivious to what's happening and may not even know what he or she is doing. If the patient is confused and wandering, either gently guide him/her away and block access to outside areas - Reassure the individual and be comforting - Call  911. In most cases, the seizure ends before EMS arrives. However, there are cases when seizures may last over 3 to 5 minutes. Or the individual may have developed breathing difficulties or severe injuries. If a pregnant patient or a person with diabetes develops a seizure, it is prudent to call an ambulance. - Finally, if the patient does not regain full consciousness, then call EMS. Most patients will remain confused for about 45 to 90 minutes after a seizure, so you must use judgment in calling for help.    After the Seizure (Postictal Stage)   After a seizure, most patients experience confusion, fatigue, muscle pain and/or a headache. Thus, one should permit the individual to sleep. For the next few days, reassurance is essential. Being calm and helping reorient the person is also of importance.   Most seizures are painless and end spontaneously. Seizures are not harmful to others but can lead to complications such as stress on the lungs, brain and the heart. Individuals with prior lung problems may develop labored breathing and respiratory distress.     IMPORTANT INFORMATION: PAY CLOSE ATTENTION   PHYSICIAN DISCHARGE INSTRUCTIONS  Follow with Primary care provider  Kathyrn Drown, MD  and other consultants as instructed by your Hospitalist Physician  [place], WORSEN OR NEW PROBLEM  DEVELOPS   Please note: You were cared for by a hospitalist during your hospital stay. Every effort will be made to forward records to your primary care provider.  You can request that your primary care provider send for your hospital records if they have not received them.  Once you are discharged, your primary care physician will handle any further medical issues. Please note that NO REFILLS for any discharge medications will be authorized once you are discharged, as it is imperative that you return to your primary care physician (or establish a  relationship with a primary care physician if you do not have one) for your post hospital discharge needs so that they can reassess your need for medications and monitor your lab values.  Please get a complete blood count and chemistry panel checked by your Primary MD at your next visit, and again as instructed by your Primary MD.  Get Medicines reviewed and adjusted: Please take all your medications with you for your next visit with your Primary MD  Laboratory/radiological data: Please request your Primary MD to go over all hospital tests and procedure/radiological results at the follow up, please ask your primary care provider to get all Hospital records sent to his/her office.  In some cases, they will be blood work, cultures and biopsy results pending at the time of your discharge. Please request that your primary care provider follow up on these results.  If you are diabetic, please bring your blood sugar readings with you to your follow up appointment with primary care.    Please call and make your follow up appointments as soon as possible.    Also Note the following: If you experience worsening of your admission symptoms, develop shortness of breath, life threatening emergency, suicidal or homicidal thoughts you must seek medical attention immediately by calling 911 or calling your MD immediately  if symptoms less severe.  You must read complete instructions/literature along with all the possible adverse reactions/side effects for all the Medicines you take and that have been prescribed to you. Take any new Medicines after you have completely understood and accpet all the possible adverse reactions/side effects.   Do not drive when taking Pain medications or sleeping medications (Benzodiazepines)  Do not take more than prescribed Pain, Sleep and Anxiety Medications. It is not advisable to combine anxiety,sleep and pain medications without talking with your primary care  practitioner  Special Instructions: If you have smoked or chewed Tobacco  in the last 2 yrs please stop smoking, stop any regular Alcohol  and or any Recreational drug use.  Wear Seat belts while driving.  Do not drive if taking any narcotic, mind altering or controlled substances or recreational drugs or alcohol.

## 2022-04-06 NOTE — Progress Notes (Signed)
  Transition of Care [facility]) Screening Note   Patient Details  Name: Nicolas [name] Date of Birth: 09-25-1975   Transition of [facility]) CM/SW Contact:    Ihor Gully, LCSW Phone Number: 04/06/2022, 9:18 AM    Transition of Care Department [facility]) has reviewed patient and no TOC needs have been identified at this time. We will continue to monitor patient advancement through interdisciplinary progression rounds. If new patient transition needs arise, please place a TOC consult.

## 2022-04-06 NOTE — Assessment & Plan Note (Signed)
Continue Norvasc
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
## 2022-04-06 NOTE — H&P (Signed)
History and Physical    Patient: Nicolas [name] [id number] DOB: 02-08-1976 DOA: 04/05/2022 DOS: the patient was seen and examined on 04/06/2022 PCP: Kathyrn Drown, MD  Patient coming from: Home  Chief Complaint:  Chief Complaint  Patient presents with   Altered Mental Status   HPI: Nicolas [name] is a 46 y.o. male with medical history significant of stomach ulcers, hypertension, pancreatitis, and more presents ED with a chief complaint of syncope and collapse.  Patient reports that this is the third time this is happened.  The last time was in July 2022.  He reports that last time he felt off, pull over to the side of the road and passed out.  He reports that last time he was still aware of everything going on around him, and when EMS put oxygen on him he woke up.  This time he was completely out.  He reports he was breathing very heavy, and felt like something was wrong.  He called 911, and sat down on the couch.  He was completely at this time.  He had urinary incontinence.  His wife saw him after EMS got there.  She reports he was lifting up off the stretcher rhythmically.  He did not immediately wake up with oxygen this time.  Patient reports he remembers starting to wake up but being confused and still not feeling like himself.  Leading up to this he has had no cough, no fever.  He quit smoking after the last episode in July 2022.  Patient reports the last time he was working that day.  Today he was at home relaxing with his daughter.  He thought it might be related to panic attack.  Patient denies any diagnosis of anxiety, but does describe situations that sound like anxiety.  He has been going through high stress situation at home, facing some eviction and going to court.  He reports that he gets a fluttering in his chest and shortness of breath when dealing with that.  It lasts several seconds.  There is no chest pain.  There is no lightheadedness.  The episode resolves on its own.   Patient reports that the same thing happens when he is family dinner on Sunday and his children get too loud.  Patient denies any history of convulsions.  Wife denies tremors or convulsions, even though she did comment on him lifting up off the stretcher.  Patient is not sure how long he was unconscious.  Patient reports that he is completely returned to baseline except for that CT dye made him flush.  Patient has no other complaints at this time.  Patient does not smoke, drinks very rarely, does not use illicit drugs.  He is not vaccinated for COVID.  Patient is full code. Review of Systems: As mentioned in the history of present illness. All other systems reviewed and are negative. Past Medical History:  Diagnosis Date   History of stomach ulcers    Hypertension    Kidney stone    bil , different times  passed stones   Migraines    Pancreatitis    Past Surgical History:  Procedure Laterality Date   BIOPSY N/A 02/11/2014   Procedure: BIOPSY;  Surgeon: Daneil Dolin, MD;  Location: [facility];  Service: Endoscopy;  Laterality: N/A;   CARDIAC CATHETERIZATION  Aug 2015   normal EF, normal coronaries   CARDIAC CATHETERIZATION     CARPAL TUNNEL RELEASE Right 08/03/2021   Procedure: Right CARPAL TUNNEL  RELEASE;  Surgeon: Sherilyn Cooter, MD;  Location: [place];  Service: Orthopedics;  Laterality: Right;   CARPAL TUNNEL RELEASE Left 11/09/2021   Procedure: LEFT CARPAL TUNNEL RELEASE;  Surgeon: Sherilyn Cooter, MD;  Location: [place];  Service: Orthopedics;  Laterality: Left;   ESOPHAGOGASTRODUODENOSCOPY (EGD) WITH PROPOFOL N/A 02/11/2014   Procedure: ESOPHAGOGASTRODUODENOSCOPY (EGD) WITH PROPOFOL;  Surgeon: Daneil Dolin, MD;  Location: [facility];  Service: Endoscopy;  Laterality: N/A;   LEFT HEART CATHETERIZATION WITH CORONARY ANGIOGRAM N/A 01/18/2014   Procedure: LEFT HEART CATHETERIZATION WITH CORONARY ANGIOGRAM;  Surgeon: Troy Sine, MD;  Location: [facility];   Service: Cardiovascular;  Laterality: N/A;   Social History:  reports that he quit smoking about 16 months ago. His smoking use included cigarettes. He has a 20.00 pack-year smoking history. He has never used smokeless tobacco. He reports that he does not drink alcohol and does not use drugs.  No Known Allergies  Family History  Problem Relation Age of Onset   Cancer Mother        Breast   Colon cancer Neg Hx     Prior to Admission medications   Medication Sig Start Date End Date Taking? Authorizing Provider  amLODipine (NORVASC) 5 MG tablet TAKE 1 TABLET BY MOUTH ONCE DAILY. 02/27/22   Kathyrn Drown, MD  clobetasol ointment (TEMOVATE) 0.05 % APPLY THIN AMOUNT TWICE DAILY TO AFFECTED AREA AS NEEDED.(DO NOT APPLY TO FACIAL AREA) 03/28/22   Coral Spikes, DO  famotidine (PEPCID) 20 MG tablet Take 1 tablet (20 mg total) by mouth 2 (two) times daily. 12/16/21   Kennith Gain, MD  fexofenadine [facility] ALLERGY) 180 MG tablet Take 1 tablet (180 mg total) by mouth 2 (two) times daily. 12/16/21   Kennith Gain, MD  rosuvastatin (CRESTOR) 5 MG tablet TAKE 1 TABLET BY MOUTH ONCE A DAY. 02/27/22   Kathyrn Drown, MD    Physical Exam: Vitals:   04/05/22 2218 04/05/22 2230 04/06/22 0217 04/06/22 0222  BP: (!) 145/85 (!) 149/77 123/68   Pulse: (!) 107 (!) 102 73   Resp: 17 (!) 22 18   Temp:   97.9 F (36.6 C)   TempSrc:      SpO2: 98% 99% 97%   Weight:    130.7 kg  Height:    6\' 3"  (1.905 m)   1.  General: Patient lying supine in bed,  no acute distress   2. Psychiatric: Alert and oriented x 3, mood and behavior normal for situation, pleasant and cooperative with exam   3. Neurologic: Speech and language are normal, face is symmetric, moves all 4 extremities voluntarily, at baseline without acute deficits on limited exam   4. HEENMT:  Head is atraumatic, normocephalic, pupils reactive to light, neck is supple, trachea is midline, mucous membranes are moist   5.  Respiratory : Lungs are clear to auscultation bilaterally without wheezing, rhonchi, rales, no cyanosis, no increase in work of breathing or accessory muscle use   6. Cardiovascular : Heart rate normal, rhythm is regular, no murmurs, rubs or gallops, no peripheral edema, peripheral pulses palpated   7. Gastrointestinal:  Abdomen is soft, nondistended, nontender to palpation bowel sounds active, no masses or organomegaly palpated   8. Skin:  Skin is warm, dry and intact without rashes, acute lesions, or ulcers on limited exam   9.Musculoskeletal:  No acute deformities or trauma, no asymmetry in tone, no peripheral edema, peripheral pulses palpated, no  tenderness to palpation in the extremities  Data Reviewed: In the ED - Temp 98.1, heart rate 101-107, respiratory rate 17-25, blood pressure 124/77-149/92, satting 83-99% pH 7.46, PCO2 43, PO2 69 Troponin 8>> 189 No leukocytosis with a white blood cell count of 11.2, hemoglobin 15.5, platelets 320 Chemistry reveals AKI 1.31 - Hyperglycemia 177, hemoglobin A1c pending Chest x-ray shows no acute abnormality EKG shows sinus tachycardia 108, QTc 514 Blood culture pending CT abdomen chest with contrast shows no PE or no acute abnormality CT head shows no acute intracranial abnormality UDS negative Urine culture pending 1 L bolus given in the ED Admission requested for syncope abs  Assessment and Plan: * Syncope and collapse - Shortness of breath followed by syncope with urinary incontinence - Hypoxic with EMS arrival - Given lactic acidosis and possible postictal state-check EEG, consult neurology - CT head showed no acute intracranial abnormality - Blood culture pending - CT abdomen chest contrast shows no PE.  No acute abnormality in the abdomen or pelvis - Chest x-ray shows nothing acute - Returned to baseline at this time with no oxygen requirement - UDS negative - Patient does report stressful situation at home - Monitor on  telemetry - Echo in a.m. - Continue to monitor  Hyperglycemia - No known history of diabetes - Check hemoglobin A1c  AKI (acute kidney injury) ([place]) - Creatinine at baseline 0.8, creatinine today 1.31 - Patient did note hypotension with EMS - Patient also had lactic acidosis 4.7 - Lactic improved to 1.6 - Patient did receive 1 L normal saline - Creatinine has also likely improved by now as patient has been back to baseline - Trend with morning labs  GERD (gastroesophageal reflux disease) - Continue Pepcid  Hyperlipidemia - Continue statin  Elevated troponin - Likely demand ischemia in the setting of hypoxia - No chest pain - Patient does report occasional palpitations - Echo in the a.m. - Trend troponin with a.m. labs - Cardiology consulted from ED and did not believe that further cardiology work-up needed to be done - Continue monitor  Essential hypertension - Continue Norvasc      Advance Care Planning:   Code Status: Full Code   Consults: Neurology  Family Communication: Wife at bedside  Severity of Illness: The appropriate patient status for this patient is OBSERVATION. Observation status is judged to be reasonable and necessary in order to provide the required intensity of service to ensure the patient's safety. The patient's presenting symptoms, physical exam findings, and initial radiographic and laboratory data in the context of their medical condition is felt to place them at decreased risk for further clinical deterioration. Furthermore, it is anticipated that the patient will be medically stable for discharge from the hospital within 2 midnights of admission.   Author: Rolla Plate, DO 04/06/2022 5:06 AM  For on call review [email or web address].

## 2022-04-06 NOTE — Progress Notes (Signed)
  Echocardiogram 2D Echocardiogram has been performed.  Kathlen Sakurai J Marshayla Mitschke 04/06/2022, 12:07 PM 

## 2022-04-06 NOTE — ED Provider Notes (Signed)
Patient signed out to me to follow results.  Patient brought to the emergency department originally because of an unresponsive spell.  Patient was treated as possible overdose because of decreased respiratory rate, hypoxia and pinpoint pupils.  He reportedly did respond to Narcan, was awake, alert and talking at arrival to the ED.  Patient underwent CT angiography chest, CT abdomen and pelvis to further evaluate.  These have been reviewed and are negative.  Patient has had a second troponin that is significantly elevated from the first at 189.  This is felt to be demand secondary to his hypoxia.  This was discussed with Dr. Humphrey Rolls, on-call for cardiology.  Does not feel that this should be treated as acute coronary syndrome/NSTEMI at this time.  Likely secondary to the hypoxia.  Continue to cycle enzymes, repeat EKG in the morning, reconsider if there are drastic changes.   Orpah Greek, MD 04/06/22 0020

## 2022-04-06 NOTE — Assessment & Plan Note (Signed)
-   Shortness of breath followed by syncope with urinary incontinence - Hypoxic with EMS arrival - Given lactic acidosis and possible postictal state-check EEG, consult neurology - CT head showed no acute intracranial abnormality - Blood culture pending - CT abdomen chest contrast shows no PE.  No acute abnormality in the abdomen or pelvis - Chest x-ray shows nothing acute - Returned to baseline at this time with no oxygen requirement - UDS negative - Patient does report stressful situation at home - Monitor on telemetry - Echo in a.m. - Continue to monitor

## 2022-04-06 NOTE — Progress Notes (Signed)
Pt arrived to telemetry unit (300) around 0230. He is alert, oriented, and ambulatory. He has been NSR on monitor SPO2 98% on RA. Denies any pain. He is aware of EEG and Echo today. No acute events since arriving to unit. Nicolas Corona Edd Fabian

## 2022-04-06 NOTE — Progress Notes (Signed)
Date and time results received: 04/06/22 1917   Test: Blood culture Critical Value: (+) 1 of 4 Aerobic bottle, Gram stain shows gram + cocci  BCID is staphylococcus species  Name of Provider Notified: Dr. Mertie Clause  Orders Received? Or Actions Taken?: awaiting response

## 2022-04-07 ENCOUNTER — Telehealth: Payer: Self-pay | Admitting: Family Medicine

## 2022-04-07 DIAGNOSIS — R739 Hyperglycemia, unspecified: Secondary | ICD-10-CM

## 2022-04-07 DIAGNOSIS — R55 Syncope and collapse: Secondary | ICD-10-CM | POA: Diagnosis not present

## 2022-04-07 DIAGNOSIS — R7989 Other specified abnormal findings of blood chemistry: Secondary | ICD-10-CM | POA: Diagnosis not present

## 2022-04-07 DIAGNOSIS — K219 Gastro-esophageal reflux disease without esophagitis: Secondary | ICD-10-CM | POA: Diagnosis not present

## 2022-04-07 DIAGNOSIS — E785 Hyperlipidemia, unspecified: Secondary | ICD-10-CM | POA: Diagnosis not present

## 2022-04-07 DIAGNOSIS — I10 Essential (primary) hypertension: Secondary | ICD-10-CM | POA: Diagnosis not present

## 2022-04-07 DIAGNOSIS — N179 Acute kidney failure, unspecified: Secondary | ICD-10-CM | POA: Diagnosis not present

## 2022-04-07 LAB — CBC WITH DIFFERENTIAL/PLATELET
Abs Immature Granulocytes: 0.04 10*3/uL (ref 0.00–0.07)
Basophils Absolute: 0 10*3/uL (ref 0.0–0.1)
Basophils Relative: 0 %
Eosinophils Absolute: 0.4 10*3/uL (ref 0.0–0.5)
Eosinophils Relative: 3 %
HCT: 42.5 % (ref 39.0–52.0)
Hemoglobin: 14.6 g/dL (ref 13.0–17.0)
Immature Granulocytes: 0 %
Lymphocytes Relative: 26 %
Lymphs Abs: 3.2 10*3/uL (ref 0.7–4.0)
MCH: 30.7 pg (ref 26.0–34.0)
MCHC: 34.4 g/dL (ref 30.0–36.0)
MCV: 89.3 fL (ref 80.0–100.0)
Monocytes Absolute: 0.9 10*3/uL (ref 0.1–1.0)
Monocytes Relative: 7 %
Neutro Abs: 8.2 10*3/uL — ABNORMAL HIGH (ref 1.7–7.7)
Neutrophils Relative %: 64 %
Platelets: 271 10*3/uL (ref 150–400)
RBC: 4.76 MIL/uL (ref 4.22–5.81)
RDW: 14.6 % (ref 11.5–15.5)
WBC: 12.7 10*3/uL — ABNORMAL HIGH (ref 4.0–10.5)
nRBC: 0 % (ref 0.0–0.2)

## 2022-04-07 LAB — LIPID PANEL
Cholesterol: 136 mg/dL (ref 0–200)
HDL: 33 mg/dL — ABNORMAL LOW (ref >40)
LDL Cholesterol: 80 mg/dL (ref 0–99)
Total CHOL/HDL Ratio: 4.1 RATIO
Triglycerides: 116 mg/dL (ref <150)
VLDL: 23 mg/dL (ref 0–40)

## 2022-04-07 LAB — URINE CULTURE: Culture: <10000 — AB

## 2022-04-07 LAB — PROCALCITONIN: Procalcitonin: 1.16 ng/mL

## 2022-04-07 NOTE — Progress Notes (Signed)
Patient discharged home with personal belongings , discharge instructions and follow uup appointments left via personal vehicle. IV removed previously .

## 2022-04-07 NOTE — Discharge Summary (Signed)
Physician Discharge Summary  Nicolas [name] YYT:[id number] DOB: [birth year] DOA: 04/05/2022  PCP: [name], MD  Admit date: 04/05/2022 Discharge date: 04/07/2022  Admitted From:  HOME  Disposition:  HOME   PT INSISTED ON LEAVING NOW AND THREATENED TO SIGN OUT AMA SO WE DISCHARGED HIM WITH RECOMMENDATIONS FOR CLOSE OUTPATIENT FOLLOW UP.   Recommendations for Outpatient Follow-up:  Follow up with PCP in 1 weeks Follow up with neurology in 1 month (ambulatory referral made) Follow up with cardiology in 1 month (ambulatory referral made) Driving restriction until cleared by outpatient provider Please monitor left internal carotid artery stenosis (50-69%) 30 day cardiac monitor will be mailed to home to wear as part of syncope work up Please follow up on:  FINAL BLOOD CULTURE RESULTS (Pt refused to wait in hospital until we got final results of 1/4 positive blood culture) Seizure precautions: Per [facility] statutes, patients with seizures are not allowed to drive until they have been seizure-free for six months and cleared by a physician    Use caution when using heavy equipment or power tools. Avoid working on ladders or at heights. Take showers instead of baths. Ensure the water temperature is not too high on the home water heater. Do not go swimming alone. Do not lock yourself in a room alone (i.e. bathroom). When caring for infants or small children, sit down when holding, feeding, or changing them to minimize risk of injury to the child in the event you have a seizure. Maintain good sleep hygiene. Avoid alcohol.    Discharge Condition: STABLE   CODE STATUS: FULL DIET: heart  healthy 2 gm sodium restricted    Brief Hospitalization Summary: Please see all hospital notes, images, labs for full details of the hospitalization. ADMISSION HPI:  46 y.o. male with medical history significant of stomach ulcers, hypertension, pancreatitis, and more presents ED with a chief  complaint of syncope and collapse.  Patient reports that this is the third time this is happened.  The last time was in July 2022.  He reports that last time he felt off, pull over to the side of the road and passed out.  He reports that last time he was still aware of everything going on around him, and when EMS put oxygen on him he woke up.  This time he was completely out.  He reports he was breathing very heavy, and felt like something was wrong.  He called 911, and sat down on the couch.  He was completely at this time.  He had urinary incontinence.  His wife saw him after EMS got there.  She reports he was lifting up off the stretcher rhythmically.  He did not immediately wake up with oxygen this time.  Patient reports he remembers starting to wake up but being confused and still not feeling like himself.  Leading up to this he has had no cough, no fever.  He quit smoking after the last episode in July 2022.  Patient reports the last time he was working that day.  Today he was at home relaxing with his daughter.  He thought it might be related to panic attack.  Patient denies any diagnosis of anxiety, but does describe situations that sound like anxiety.  He has been going through high stress situation at home, facing some eviction and going to court.  He reports that he gets a fluttering in his chest and shortness of breath when dealing with that.  It lasts several seconds.  There is no chest pain.  There is no lightheadedness.  The episode resolves on its own.  Patient reports that the same thing happens when he is family dinner on Sunday and his children get too loud.  Patient denies any history of convulsions.  Wife denies tremors or convulsions, even though she did comment on him lifting up off the stretcher.  Patient is not sure how long he was unconscious.  Patient reports that he is completely returned to baseline except for that CT dye made him flush.  Patient has no other complaints at this time.    Patient does not smoke, drinks very rarely, does not use illicit drugs.  He is not vaccinated for COVID. Patient is full code.    HOSPITAL COURSE BY PROBLEM   Patient was admitted for recurrent syncopal work-up.  He had a 2D echocardiogram with findings listed below no significant findings to explain symptoms.  He also had a neurology consultation and had a negative EEG.  Patient was placed on driving restrictions.  Neurology recommended a 30-day cardiac monitor which has been arranged and the plan is for the monitor to be mailed to his home for him to wear for 30 days.  He verbalized understanding.  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  He is taking rosuvastatin 5 mg daily.  He is taking amlodipine for blood pressure management.  He was advised to follow-up with his PCP for ongoing management of his blood pressure.  Patient also was noted to have a positive blood culture showing gram-positive cocci.  Patient has been impatient and not wanting to wait in the hospital to get the final culture results back.  I suspect that this is likely a contaminant but patient was not willing to wait for [place] to get the results and threatened to leave the hospital AGAINST MEDICAL ADVICE.  We decided to discharge him and have him follow-up with his primary care provider which he has agreed to do.  Patient otherwise has been stable.  He was hydrated with IV fluids.  His lactic acidosis has resolved.  His urine toxicology screen was negative.  He was given written instructions and verbal instructions regarding driving restrictions until he can be cleared by his outpatient providers that is safe for him to return to driving or operating motor vehicles and machinery.  Patient verbalized understanding.  Discharge Diagnoses:  Principal Problem:   Syncope and collapse Active Problems:   Essential  hypertension   Elevated troponin   Hyperlipidemia   GERD (gastroesophageal reflux disease)   AKI (acute kidney injury) (HCC)   Hyperglycemia   Discharge Instructions: Discharge Instructions     Ambulatory referral to Cardiology   Complete by: As directed    Ambulatory referral to Neurology   Complete by: As directed    An appointment is requested in approximately: 4 weeks      Allergies as of 04/07/2022   No Known Allergies      Medication List     STOP taking these medications    fexofenadine 180 MG tablet Commonly known as: Allegra Allergy       TAKE these medications    amLODipine 5 MG tablet Commonly known as: NORVASC TAKE 1 TABLET BY MOUTH ONCE DAILY.   clobetasol ointment 0.05 % Commonly known as: TEMOVATE APPLY THIN AMOUNT TWICE DAILY TO AFFECTED AREA AS NEEDED.(DO NOT APPLY TO FACIAL  AREA)   famotidine 20 MG tablet Commonly known as: Pepcid Take 1 tablet (20 mg total) by mouth 2 (two) times daily.   rosuvastatin 5 MG tablet Commonly known as: CRESTOR TAKE 1 TABLET BY MOUTH ONCE A DAY.        Follow-up Information     Luking, Jonna Coup, MD. Schedule an appointment as soon as possible for a visit in 3 day(s).   Specialty: Family Medicine Why: Hospital Follow Up Contact information: [address] [place] [address] [phone]         [facility]. Schedule an appointment as soon as possible for a visit in 1 month(s).   Specialty: Neurology Why: Hospital Follow Up Contact information: [address] [place] [address] [phone]               No Known Allergies Allergies as of 04/07/2022   No Known Allergies      Medication List     STOP taking these medications    fexofenadine 180 MG tablet Commonly known as: Allegra Allergy       TAKE these medications    amLODipine 5 MG tablet Commonly known as: NORVASC TAKE 1 TABLET BY MOUTH ONCE DAILY.    clobetasol ointment 0.05 % Commonly known as: TEMOVATE APPLY THIN AMOUNT TWICE DAILY TO AFFECTED AREA AS NEEDED.(DO NOT APPLY TO FACIAL AREA)   famotidine 20 MG tablet Commonly known as: Pepcid Take 1 tablet (20 mg total) by mouth 2 (two) times daily.   rosuvastatin 5 MG tablet Commonly known as: CRESTOR TAKE 1 TABLET BY MOUTH ONCE A DAY.        Procedures/Studies: US Carotid Bilateral  Result Date: 04/06/2022 CLINICAL DATA:  Syncope EXAM: BILATERAL CAROTID DUPLEX ULTRASOUND TECHNIQUE: Wallace Cullens scale imaging, color Doppler and duplex ultrasound were performed of bilateral carotid and vertebral arteries in the neck. COMPARISON:  None Available. FINDINGS: Criteria: Quantification of carotid stenosis is based on velocity parameters that correlate the residual internal carotid diameter with NASCET-based stenosis levels, using the diameter of the distal internal carotid lumen as the denominator for stenosis measurement. The following velocity measurements were obtained: RIGHT ICA: 95/26 cm/sec CCA: 129/29 cm/sec SYSTOLIC ICA/CCA RATIO:  0.7 ECA:  217 cm/sec LEFT ICA: 138/16 cm/sec CCA: 147/29 cm/sec SYSTOLIC ICA/CCA RATIO:  0.9 ECA:  176 cm/sec RIGHT CAROTID ARTERY: Trace smooth heterogeneous atherosclerotic plaque in the proximal internal carotid artery. By peak systolic velocity criteria, the estimated stenosis is less than 50%. RIGHT VERTEBRAL ARTERY:  Patent with normal antegrade flow. LEFT CAROTID ARTERY: Mild heterogeneous atherosclerotic plaque in the proximal internal carotid artery. By peak systolic velocity criteria, the estimated stenosis falls in the 50-69 % range. LEFT VERTEBRAL ARTERY:  Patent with normal antegrade flow. IMPRESSION: 1. Mild (1-49%) stenosis proximal right internal carotid artery secondary to heterogenous atherosclerotic plaque. 2. Moderate (50-69%) stenosis proximal left internal carotid artery secondary to heterogenous atherosclerotic plaque. 3. Vertebral arteries are  patent with normal antegrade flow. Signed, Sterling Big, MD, RPVI [facility] Electronically Signed   By: Malachy Moan M.D.   On: 04/06/2022 15:14   MR BRAIN WO CONTRAST  Result Date: 04/06/2022 EXAM: MRI HEAD WITHOUT CONTRAST TECHNIQUE: Multiplanar, multiecho pulse sequences of the brain and surrounding structures were obtained without intravenous contrast. COMPARISON:  Head CT April 05, 2022 FINDINGS: Incomplete study due to patient inability to lie still in the scanner for the duration of the procedure. Some of  the images obtained are degraded by motion. Brain: No acute infarction, hemorrhage, hydrocephalus, extra-axial collection or mass lesion. Vascular: Normal flow voids. Skull and upper cervical spine: No focal marrow lesion identified. Sinuses/Orbits: Mild mucosal thickening scattered throughout the paranasal sinuses. The orbits are maintained. Other: None. IMPRESSION: 1. Incomplete study due to patient inability to lie still in the scanner for the duration of the procedure. Some of the images obtained are degraded by motion. 2. No acute intracranial abnormality identified. Electronically Signed   By: Baldemar Lenis M.D.   On: 04/06/2022 13:07   EEG adult  Result Date: 04/06/2022 Charlsie Quest, MD     04/06/2022  9:10 PM Patient Name: Nicolas [name] MRN: [id number] Epilepsy Attending: Charlsie Quest Referring Physician/Provider: Lilyan Gilford, DO Date: 04/06/2022 Duration: 22.56 mins Patient history: 46 year old male with syncope.  EEG to evaluate for seizure. Level of alertness: Awake AEDs during EEG study: None Technical aspects: This EEG study was done with scalp electrodes positioned according to the 10-20 International system of electrode placement. Electrical activity was reviewed with band pass filter of 1-70Hz , sensitivity of 7 uV/mm, display speed of 69mm/sec with a 60Hz  notched filter  applied as appropriate. EEG data were recorded continuously and digitally stored.  Video monitoring was available and reviewed as appropriate. Description: Majority of the study was difficult to interpret du to significant movement and electrode artifact. After filtering for artifacts, briefly posterior dominant rhythm of 10-11Hz  activity of moderate voltage (25-35 uV) was seen predominantly in posterior head regions, symmetric and reactive to eye opening and eye closing.  Hyperventilation and photic stimulation were not performed.   IMPRESSION: This technically difficult study is within normal limits. No seizures or epileptiform discharges were seen throughout the recording. A normal interictal EEG does not exclude the diagnosis of epilepsy. If suspicion for interictal activity remains a concern, a prolonged study can be considered.   ECHOCARDIOGRAM COMPLETE  Result Date: 04/06/2022    ECHOCARDIOGRAM REPORT   Patient Name:   Nicolas [name] Date of Exam: 04/06/2022 Medical Rec #:  04/08/2022        Height:       75.0 in Accession #:    [id number]       Weight:       288.1 lb Date of Birth:  Oct 09, 1975        BSA:          2.563 m Patient Age:    46 years         BP:           144/70 mmHg Patient Gender: M                HR:           82 bpm. Exam Location:  01/04/1977 Procedure: 2D Echo, Cardiac Doppler, Color Doppler and Intracardiac            Opacification Agent Indications:    Elevated Troponin  History:        Patient has no prior history of Echocardiogram examinations.                 Signs/Symptoms:Syncope and Shortness of Breath; Risk                 Factors:Hypertension, Dyslipidemia and Former Smoker.  Sonographer:    Jeani Hawking Referring Phys: Aron Baba ASIA B ZIERLE-GHOSH  Sonographer Comments: Technically difficult study due to poor echo windows, suboptimal  parasternal window and suboptimal apical window. Image acquisition challenging due to respiratory motion and Image  acquisition challenging due to patient body habitus. IMPRESSIONS  1. Left ventricular ejection fraction, by estimation, is 60 to 65%. The left ventricle has normal function. The left ventricle has no regional wall motion abnormalities. Left ventricular diastolic parameters were normal.  2. Right ventricular systolic function is normal. The right ventricular size is normal. Tricuspid regurgitation signal is inadequate for assessing PA pressure.  3. The mitral valve was not well visualized. No evidence of mitral valve regurgitation. No evidence of mitral stenosis.  4. The aortic valve was not well visualized. Aortic valve regurgitation is not visualized. No aortic stenosis is present.  5. The inferior vena cava is dilated in size with >50% respiratory variability, suggesting right atrial pressure of 8 mmHg. FINDINGS  Left Ventricle: Left ventricular ejection fraction, by estimation, is 60 to 65%. The left ventricle has normal function. The left ventricle has no regional wall motion abnormalities. Definity contrast agent was given IV to delineate the left ventricular  endocardial borders. The left ventricular internal cavity size was normal in size. There is no left ventricular hypertrophy. Left ventricular diastolic parameters were normal. Right Ventricle: The right ventricular size is normal. Right vetricular wall thickness was not well visualized. Right ventricular systolic function is normal. Tricuspid regurgitation signal is inadequate for assessing PA pressure. Left Atrium: Left atrial size was normal in size. Right Atrium: Right atrial size was normal in size. Pericardium: There is no evidence of pericardial effusion. Mitral Valve: The mitral valve was not well visualized. No evidence of mitral valve regurgitation. No evidence of mitral valve stenosis. Tricuspid Valve: The tricuspid valve is not well visualized. Tricuspid valve regurgitation is not demonstrated. No evidence of tricuspid stenosis. Aortic Valve:  The aortic valve was not well visualized. Aortic valve regurgitation is not visualized. No aortic stenosis is present. Aortic valve mean gradient measures 6.1 mmHg. Aortic valve peak gradient measures 12.5 mmHg. Aortic valve area, by VTI measures 2.36 cm. Pulmonic Valve: The pulmonic valve was not well visualized. Pulmonic valve regurgitation is not visualized. No evidence of pulmonic stenosis. Aorta: The aortic root is normal in size and structure. Venous: The inferior vena cava is dilated in size with greater than 50% respiratory variability, suggesting right atrial pressure of 8 mmHg. IAS/Shunts: No atrial level shunt detected by color flow Doppler.  LEFT VENTRICLE PLAX 2D LV PW:         0.90 cm   Diastology LV IVS:        0.90 cm   LV e' medial:    9.46 cm/s LVOT diam:     2.20 cm   LV E/e' medial:  13.5 LV SV:         79        LV e' lateral:   11.30 cm/s LV SV Index:   31        LV E/e' lateral: 11.3 LVOT Area:     3.80 cm  RIGHT VENTRICLE RV S prime:     16.40 cm/s TAPSE (M-mode): 3.2 cm LEFT ATRIUM             Index        RIGHT ATRIUM           Index LA Vol (A2C):   47.5 ml 18.53 ml/m  RA Area:     22.10 cm LA Vol (A4C):   62.9 ml 24.54 ml/m  RA Volume:   72.00 ml  28.09 ml/m LA Biplane Vol: 58.6 ml 22.87 ml/m  AORTIC VALVE AV Area (Vmax):    2.49 cm AV Area (Vmean):   2.38 cm AV Area (VTI):     2.36 cm AV Vmax:           177.00 cm/s AV Vmean:          114.981 cm/s AV VTI:            0.336 m AV Peak Grad:      12.5 mmHg AV Mean Grad:      6.1 mmHg LVOT Vmax:         116.00 cm/s LVOT Vmean:        72.000 cm/s LVOT VTI:          0.209 m LVOT/AV VTI ratio: 0.62  AORTA Ao Root diam: 3.50 cm Ao Asc diam:  3.30 cm MITRAL VALVE MV Area (PHT): 4.39 cm     SHUNTS MV Decel Time: 173 msec     Systemic VTI:  0.21 m MV E velocity: 128.00 cm/s  Systemic Diam: 2.20 cm MV A velocity: 93.00 cm/s MV E/A ratio:  1.38 Dina Rich MD Electronically signed by Dina Rich MD Signature Date/Time:  04/06/2022/11:59:25 AM    Final    CT Angio Chest PE W and/or Wo Contrast  Result Date: 04/06/2022 CLINICAL DATA:  Found unresponsive and hypoxic EXAM: CT ANGIOGRAPHY CHEST CT ABDOMEN AND PELVIS WITH CONTRAST TECHNIQUE: Multidetector CT imaging of the chest was performed using the standard protocol during bolus administration of intravenous contrast. Multiplanar CT image reconstructions and MIPs were obtained to evaluate the vascular anatomy. Multidetector CT imaging of the abdomen and pelvis was performed using the standard protocol during bolus administration of intravenous contrast. RADIATION DOSE REDUCTION: This exam was performed according to the departmental dose-optimization program which includes automated exposure control, adjustment of the mA and/or kV according to patient size and/or use of iterative reconstruction technique. CONTRAST:  OMNIPAQUE IOHEXOL 350 MG/ML SOLN COMPARISON:  Chest x-ray from earlier in the same day, CT from 03/03/2021. FINDINGS: CTA CHEST FINDINGS Cardiovascular: Thoracic aorta is within normal limits without aneurysmal dilatation or dissection. Heart is mildly enlarged in size. The pulmonary artery shows a normal branching pattern bilaterally. Some motion artifact is noted although no filling defect to suggest pulmonary embolism is seen. Mild coronary calcifications are noted. Mediastinum/Nodes: Thoracic inlet is within normal limits. Small mediastinal nodes are seen but not significant by size criteria. The esophagus is within normal limits. Lungs/Pleura: Lungs are well aerated bilaterally. No focal infiltrate or sizable effusion is seen. No pneumothorax is noted. Musculoskeletal: Degenerative changes of the thoracic spine are seen. No acute rib abnormality is noted. Review of the MIP images confirms the above findings. CT ABDOMEN and PELVIS FINDINGS Hepatobiliary: Gallbladder is decompressed. Liver appears within normal limits. Pancreas: Unremarkable. No pancreatic  ductal dilatation or surrounding inflammatory changes. Spleen: Normal in size without focal abnormality. Adrenals/Urinary Tract: Adrenal glands demonstrate bilateral calcifications similar to that seen on prior exam. No focal lesion is noted. The kidneys demonstrate a normal enhancement pattern bilaterally. No renal calculi are identified. A 1.5 cm hypodensity is noted in the left kidney stable from the prior exam consistent with cyst. No further follow-up is recommended. The bladder is partially distended. Stomach/Bowel: Scattered fecal material is noted throughout the colon. Mild diverticular change is seen. No obstructive changes are noted. The appendix is within normal limits. Small bowel and stomach are unremarkable. Vascular/Lymphatic: Aortic atherosclerosis. No enlarged abdominal  or pelvic lymph nodes. Reproductive: Prostate is unremarkable. Other: No abdominal wall hernia or abnormality. No abdominopelvic ascites. Musculoskeletal: No acute or significant osseous findings. Review of the MIP images confirms the above findings. IMPRESSION: CTA of the chest: No evidence of pulmonary embolism. CT of the abdomen and pelvis: No acute abnormality noted to correspond with the given clinical history. Electronically Signed   By: Inez Catalina M.D.   On: 04/06/2022 00:13   CT ABDOMEN PELVIS W CONTRAST  Result Date: 04/06/2022 CLINICAL DATA:  Found unresponsive and hypoxic EXAM: CT ANGIOGRAPHY CHEST CT ABDOMEN AND PELVIS WITH CONTRAST TECHNIQUE: Multidetector CT imaging of the chest was performed using the standard protocol during bolus administration of intravenous contrast. Multiplanar CT image reconstructions and MIPs were obtained to evaluate the vascular anatomy. Multidetector CT imaging of the abdomen and pelvis was performed using the standard protocol during bolus administration of intravenous contrast. RADIATION DOSE REDUCTION: This exam was performed according to the departmental dose-optimization program  which includes automated exposure control, adjustment of the mA and/or kV according to patient size and/or use of iterative reconstruction technique. CONTRAST:  155mL OMNIPAQUE IOHEXOL 350 MG/ML SOLN COMPARISON:  Chest x-ray from earlier in the same day, CT from 03/03/2021. FINDINGS: CTA CHEST FINDINGS Cardiovascular: Thoracic aorta is within normal limits without aneurysmal dilatation or dissection. Heart is mildly enlarged in size. The pulmonary artery shows a normal branching pattern bilaterally. Some motion artifact is noted although no filling defect to suggest pulmonary embolism is seen. Mild coronary calcifications are noted. Mediastinum/Nodes: Thoracic inlet is within normal limits. Small mediastinal nodes are seen but not significant by size criteria. The esophagus is within normal limits. Lungs/Pleura: Lungs are well aerated bilaterally. No focal infiltrate or sizable effusion is seen. No pneumothorax is noted. Musculoskeletal: Degenerative changes of the thoracic spine are seen. No acute rib abnormality is noted. Review of the MIP images confirms the above findings. CT ABDOMEN and PELVIS FINDINGS Hepatobiliary: Gallbladder is decompressed. Liver appears within normal limits. Pancreas: Unremarkable. No pancreatic ductal dilatation or surrounding inflammatory changes. Spleen: Normal in size without focal abnormality. Adrenals/Urinary Tract: Adrenal glands demonstrate bilateral calcifications similar to that seen on prior exam. No focal lesion is noted. The kidneys demonstrate a normal enhancement pattern bilaterally. No renal calculi are identified. A 1.5 cm hypodensity is noted in the left kidney stable from the prior exam consistent with cyst. No further follow-up is recommended. The bladder is partially distended. Stomach/Bowel: Scattered fecal material is noted throughout the colon. Mild diverticular change is seen. No obstructive changes are noted. The appendix is within normal limits. Small bowel and  stomach are unremarkable. Vascular/Lymphatic: Aortic atherosclerosis. No enlarged abdominal or pelvic lymph nodes. Reproductive: Prostate is unremarkable. Other: No abdominal wall hernia or abnormality. No abdominopelvic ascites. Musculoskeletal: No acute or significant osseous findings. Review of the MIP images confirms the above findings. IMPRESSION: CTA of the chest: No evidence of pulmonary embolism. CT of the abdomen and pelvis: No acute abnormality noted to correspond with the given clinical history. Electronically Signed   By: Inez Catalina M.D.   On: 04/06/2022 00:13   CT Head Wo Contrast  Result Date: 04/06/2022 CLINICAL DATA:  Syncope or presyncope, cerebrovascular cause suspected. Patient was unresponsive and hypoxic. EXAM: CT HEAD WITHOUT CONTRAST TECHNIQUE: Contiguous axial images were obtained from the base of the skull through the vertex without intravenous contrast. RADIATION DOSE REDUCTION: This exam was performed according to the departmental dose-optimization program which includes automated exposure control, adjustment of the  mA and/or kV according to patient size and/or use of iterative reconstruction technique. COMPARISON:  01/18/2014 FINDINGS: Brain: No evidence of acute infarction, hemorrhage, hydrocephalus, extra-axial collection or mass lesion/mass effect. Vascular: No hyperdense vessel or unexpected calcification. Skull: Normal. Negative for fracture or focal lesion. Sinuses/Orbits: No acute finding. Other: None. IMPRESSION: No acute intracranial abnormalities. Electronically Signed   By: Burman Nieves M.D.   On: 04/06/2022 00:10   DG Chest Port 1 View  Result Date: 04/05/2022 CLINICAL DATA:  Sepsis EXAM: PORTABLE CHEST 1 VIEW COMPARISON:  11/18/2020 FINDINGS: Two frontal views of the chest demonstrate an unremarkable cardiac silhouette. No airspace disease, effusion, or pneumothorax. No acute bony abnormalities. IMPRESSION: 1. No acute intrathoracic process. Electronically  Signed   By: Sharlet Salina M.D.   On: 04/05/2022 20:26     Subjective: Pt says that he will sign out AMA if we don't discharge him now.   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    Discharge Exam: Vitals:   04/06/22 2141 04/07/22 0622  BP: (!) 143/79 (!) 168/90  Pulse: 87 87  Resp:    Temp: 98.4 F (36.9 C) 98.3 F (36.8 C)  SpO2: 100% 99%   Vitals:   04/06/22 1800 04/06/22 1805 04/06/22 2141 04/07/22 0622  BP: (!) 161/107 (!) 160/87 (!) 143/79 (!) 168/90  Pulse: 94 90 87 87  Resp: 20     Temp:   98.4 F (36.9 C) 98.3 F (36.8 C)  TempSrc:    Oral  SpO2:   100% 99%  Weight:      Height:       General: Pt is alert, awake, not in acute distress Cardiovascular: normal S1/S2 +, no rubs, no gallops Respiratory: CTA bilaterally, no wheezing, no rhonchi Abdominal: Soft, NT, ND, bowel sounds + Extremities: multiple tattoos over entire body, no edema, no cyanosis   The results of significant diagnostics from this hospitalization (including imaging, microbiology, ancillary and laboratory) are listed below for reference.     Microbiology: Recent Results (from the past 240 hour(s))  Culture, blood (Routine X 2) w Reflex to ID Panel     Status: None (Preliminary result)   Collection Time: 04/05/22  8:25 PM   Specimen: Left Antecubital  Result Value Ref Range Status   Specimen Description LEFT ANTECUBITAL  Final   Special Requests   Final    BOTTLES DRAWN AEROBIC AND ANAEROBIC Blood Culture adequate volume   Culture   Final    NO GROWTH 2 DAYS Performed at [facility], [address], [place], [place] [address]    Report Status PENDING  Incomplete  Blood Culture (routine x 2)     Status: None (Preliminary result)   Collection Time: 04/05/22  8:26 PM   Specimen: BLOOD LEFT HAND  Result Value Ref Range Status   Specimen Description   Final    BLOOD LEFT HAND Performed at [facility], [address]  [place], [place] [address]    Special Requests   Final    BOTTLES DRAWN AEROBIC AND ANAEROBIC Blood Culture adequate volume Performed at [facility], [address], [place], [place] [address]    Culture  Setup Time   Final    GRAM POSITIVE COCCI AEROBIC BOTTLE ONLY Gram Stain Report Called to,Read Back By and Verified With: RAY,ANGEL 04/06/22 @1429  BY SSL CRITICAL RESULT CALLED TO, READ BACK BY AND VERIFIED WITH: RN C. Thad Ranger  [phone] @1917  FH Performed at [facility], [address], [place], [place]    Culture GRAM POSITIVE COCCI  Final   Report Status PENDING  Incomplete  Blood Culture ID Panel (Reflexed)     Status: Abnormal   Collection Time: 04/05/22  8:26 PM  Result Value Ref Range Status   Enterococcus faecalis NOT DETECTED NOT DETECTED Final   Enterococcus Faecium NOT DETECTED NOT DETECTED Final   Listeria monocytogenes NOT DETECTED NOT DETECTED Final   Staphylococcus species DETECTED (A) NOT DETECTED Final    Comment: CRITICAL RESULT CALLED TO, READ BACK BY AND VERIFIED WITH: RN C. 04/07/22 [phone] @1917  FH    Staphylococcus aureus (BCID) NOT DETECTED NOT DETECTED Final   Staphylococcus epidermidis NOT DETECTED NOT DETECTED Final   Staphylococcus lugdunensis NOT DETECTED NOT DETECTED Final   Streptococcus species NOT DETECTED NOT DETECTED Final   Streptococcus agalactiae NOT DETECTED NOT DETECTED Final   Streptococcus pneumoniae NOT DETECTED NOT DETECTED Final   Streptococcus pyogenes NOT DETECTED NOT DETECTED Final   A.calcoaceticus-baumannii NOT DETECTED NOT DETECTED Final   Bacteroides fragilis NOT DETECTED NOT DETECTED Final   Enterobacterales NOT DETECTED NOT DETECTED Final   Enterobacter cloacae complex NOT DETECTED NOT DETECTED Final   Escherichia coli NOT DETECTED NOT DETECTED Final   Klebsiella aerogenes NOT DETECTED NOT DETECTED Final   Klebsiella oxytoca NOT DETECTED NOT DETECTED Final   Klebsiella pneumoniae NOT DETECTED NOT DETECTED Final    Proteus species NOT DETECTED NOT DETECTED Final   Salmonella species NOT DETECTED NOT DETECTED Final   Serratia marcescens NOT DETECTED NOT DETECTED Final   Haemophilus influenzae NOT DETECTED NOT DETECTED Final   Neisseria meningitidis NOT DETECTED NOT DETECTED Final   Pseudomonas aeruginosa NOT DETECTED NOT DETECTED Final   Stenotrophomonas maltophilia NOT DETECTED NOT DETECTED Final   Candida albicans NOT DETECTED NOT DETECTED Final   Candida auris NOT DETECTED NOT DETECTED Final   Candida glabrata NOT DETECTED NOT DETECTED Final   Candida krusei NOT DETECTED NOT DETECTED Final   Candida parapsilosis NOT DETECTED NOT DETECTED Final   Candida tropicalis NOT DETECTED NOT DETECTED Final   Cryptococcus neoformans/gattii NOT DETECTED NOT DETECTED Final    Comment: Performed at [facility], [address], [place], [address] [place]  Urine Culture     Status: Abnormal   Collection Time: 04/05/22  9:25 PM   Specimen: Urine, Clean Catch  Result Value Ref Range Status   Specimen Description   Final    URINE, CLEAN CATCH Performed at [facility], [address] [place] [address], [place], [address] [place]    Special Requests   Final    NONE Performed at [facility], [address] [place], [address] [place]    Culture (A)  Final    <10,000 COLONIES/mL INSIGNIFICANT GROWTH Performed at [facility], [address] [place], [address] [place]    Report Status 04/07/2022 FINAL  Final  Culture, blood (single) w Reflex to ID Panel     Status: None (Preliminary result)   Collection Time: 04/06/22  3:21 PM   Specimen: BLOOD LEFT ARM  Result Value Ref Range Status   Specimen Description BLOOD LEFT ARM  Final   Special Requests   Final    BOTTLES DRAWN AEROBIC AND ANAEROBIC Blood Culture adequate volume   Culture   Final    NO GROWTH < 24 HOURS Performed at [facility], [address] [place], [address] [place]    Report  Status PENDING  Incomplete     Labs: BNP (last 3 results) No  results for input(s): "BNP" in the last 8760 hours. Basic Metabolic Panel: Recent Labs  Lab 04/05/22 1953 04/06/22 0330  NA 141 138  K 3.5 4.2  CL 103 108  CO2 26 22  GLUCOSE 177* 220*  BUN 21* 20  CREATININE 1.31* 0.95  CALCIUM 9.1 8.7*  MG  --  2.4   Liver Function Tests: Recent Labs  Lab 04/05/22 1953 04/06/22 0330  AST 32 23  ALT 37 33  ALKPHOS 95 88  BILITOT 0.8 0.4  PROT 6.9 6.4*  ALBUMIN 4.0 3.5   No results for input(s): "LIPASE", "AMYLASE" in the last 168 hours. No results for input(s): "AMMONIA" in the last 168 hours. CBC: Recent Labs  Lab 04/05/22 1953 04/06/22 0330 04/07/22 0413  WBC 11.2* 17.0* 12.7*  NEUTROABS 4.7 12.7* 8.2*  HGB 15.5 14.1 14.6  HCT 44.7 41.2 42.5  MCV 88.9 90.4 89.3  PLT 320 263 271   Cardiac Enzymes: No results for input(s): "CKTOTAL", "CKMB", "CKMBINDEX", "TROPONINI" in the last 168 hours. BNP: Invalid input(s): "POCBNP" CBG: No results for input(s): "GLUCAP" in the last 168 hours. D-Dimer No results for input(s): "DDIMER" in the last 72 hours. Hgb A1c Recent Labs    04/05/22 2001  HGBA1C 5.9*   Lipid Profile Recent Labs    04/07/22 0413  CHOL 136  HDL 33*  LDLCALC 80  TRIG 161  CHOLHDL 4.1   Thyroid function studies No results for input(s): "TSH", "T4TOTAL", "T3FREE", "THYROIDAB" in the last 72 hours.  Invalid input(s): "FREET3" Anemia work up No results for input(s): "VITAMINB12", "FOLATE", "FERRITIN", "TIBC", "IRON", "RETICCTPCT" in the last 72 hours. Urinalysis    Component Value Date/Time   COLORURINE YELLOW 04/05/2022 2125   APPEARANCEUR CLOUDY (A) 04/05/2022 2125   LABSPEC 1.013 04/05/2022 2125   PHURINE 7.0 04/05/2022 2125   GLUCOSEU NEGATIVE 04/05/2022 2125   HGBUR NEGATIVE 04/05/2022 2125   BILIRUBINUR NEGATIVE 04/05/2022 2125   BILIRUBINUR + 05/20/2020 1343   KETONESUR NEGATIVE 04/05/2022 2125   PROTEINUR 100 (A) 04/05/2022 2125   UROBILINOGEN 0.2 05/20/2020 1343   UROBILINOGEN 4.0 (H)  02/09/2014 2021   NITRITE NEGATIVE 04/05/2022 2125   LEUKOCYTESUR NEGATIVE 04/05/2022 2125   Sepsis Labs Recent Labs  Lab 04/05/22 1953 04/06/22 0330 04/07/22 0413  WBC 11.2* 17.0* 12.7*   Microbiology Recent Results (from the past 240 hour(s))  Culture, blood (Routine X 2) w Reflex to ID Panel     Status: None (Preliminary result)   Collection Time: 04/05/22  8:25 PM   Specimen: Left Antecubital  Result Value Ref Range Status   Specimen Description LEFT ANTECUBITAL  Final   Special Requests   Final    BOTTLES DRAWN AEROBIC AND ANAEROBIC Blood Culture adequate volume   Culture   Final    NO GROWTH 2 DAYS Performed at [facility], [address] [place], [place] [address]    Report Status PENDING  Incomplete  Blood Culture (routine x 2)     Status: None (Preliminary result)   Collection Time: 04/05/22  8:26 PM   Specimen: BLOOD LEFT HAND  Result Value Ref Range Status   Specimen Description   Final    BLOOD LEFT HAND Performed at [facility], [address], [place], [place] [address]    Special Requests   Final    BOTTLES DRAWN AEROBIC AND ANAEROBIC Blood Culture adequate volume Performed at [facility], [address] [place], [place] [address]    Culture  Setup Time   Final    GRAM POSITIVE COCCI AEROBIC BOTTLE ONLY Gram Stain Report Called to,Read Back By and Verified With: RAY,ANGEL 04/06/22  BY SSL CRITICAL RESULT CALLED TO, READ BACK BY AND VERIFIED WITH: RN Lambert Mody [phone]  FH Performed at [facility], [address], [place], [place] [address]    Culture GRAM POSITIVE COCCI  Final   Report Status PENDING  Incomplete  Blood Culture ID Panel (Reflexed)     Status: Abnormal   Collection Time: 04/05/22  8:26 PM  Result Value Ref Range Status   Enterococcus faecalis NOT DETECTED NOT DETECTED Final   Enterococcus Faecium NOT DETECTED NOT DETECTED Final   Listeria monocytogenes NOT DETECTED NOT DETECTED Final   Staphylococcus species DETECTED  (A) NOT DETECTED Final    Comment: CRITICAL RESULT CALLED TO, READ BACK BY AND VERIFIED WITH: RN C. Thad Ranger [phone]  FH    Staphylococcus aureus (BCID) NOT DETECTED NOT DETECTED Final   Staphylococcus epidermidis NOT DETECTED NOT DETECTED Final   Staphylococcus lugdunensis NOT DETECTED NOT DETECTED Final   Streptococcus species NOT DETECTED NOT DETECTED Final   Streptococcus agalactiae NOT DETECTED NOT DETECTED Final   Streptococcus pneumoniae NOT DETECTED NOT DETECTED Final   Streptococcus pyogenes NOT DETECTED NOT DETECTED Final   A.calcoaceticus-baumannii NOT DETECTED NOT DETECTED Final   Bacteroides fragilis NOT DETECTED NOT DETECTED Final   Enterobacterales NOT DETECTED NOT DETECTED Final   Enterobacter cloacae complex NOT DETECTED NOT DETECTED Final   Escherichia coli NOT DETECTED NOT DETECTED Final   Klebsiella aerogenes NOT DETECTED NOT DETECTED Final   Klebsiella oxytoca NOT DETECTED NOT DETECTED Final   Klebsiella pneumoniae NOT DETECTED NOT DETECTED Final   Proteus species NOT DETECTED NOT DETECTED Final   Salmonella species NOT DETECTED NOT DETECTED Final   Serratia marcescens NOT DETECTED NOT DETECTED Final   Haemophilus influenzae NOT DETECTED NOT DETECTED Final   Neisseria meningitidis NOT DETECTED NOT DETECTED Final   Pseudomonas aeruginosa NOT DETECTED NOT DETECTED Final   Stenotrophomonas maltophilia NOT DETECTED NOT DETECTED Final   Candida albicans NOT DETECTED NOT DETECTED Final   Candida auris NOT DETECTED NOT DETECTED Final   Candida glabrata NOT DETECTED NOT DETECTED Final   Candida krusei NOT DETECTED NOT DETECTED Final   Candida parapsilosis NOT DETECTED NOT DETECTED Final   Candida tropicalis NOT DETECTED NOT DETECTED Final   Cryptococcus neoformans/gattii NOT DETECTED NOT DETECTED Final    Comment: Performed at [facility], [address], [place], [place] [address]  Urine Culture     Status: Abnormal   Collection Time: 04/05/22  9:25 PM    Specimen: Urine, Clean Catch  Result Value Ref Range Status   Specimen Description   Final    URINE, CLEAN CATCH Performed at  [facility], [address], [place], [place] [address]    Special Requests   Final    NONE Performed at [facility], [address], [place], [place] [address]    Culture (A)  Final    <10,000 COLONIES/mL INSIGNIFICANT GROWTH Performed at [facility], [address] [place], [place] [address]    Report Status 04/07/2022 FINAL  Final  Culture, blood (single) w Reflex to ID Panel     Status: None (Preliminary result)   Collection Time: 04/06/22  3:21 PM   Specimen: BLOOD LEFT ARM  Result Value Ref Range Status   Specimen Description  BLOOD LEFT ARM  Final   Special Requests   Final    BOTTLES DRAWN AEROBIC AND ANAEROBIC Blood Culture adequate volume   Culture   Final    NO GROWTH < 24 HOURS Performed at [facility], [address] [place], [place] [address]    Report Status PENDING  Incomplete   Time coordinating discharge:  36 mins  SIGNED:  Standley Dakins, MD  [facility] 04/07/2022, 10:14 AM How to contact the [facility] Attending or Consulting provider 7A - 7P or covering provider during after hours 7P -7A, for this patient?  Check the care team in [facility] and look for a) attending/consulting [facility] provider listed and b) the [facility] team listed Log into [email or web address] and use [place]'s universal password to access. If you do not have the password, please contact the hospital operator. Locate the [facility] provider you are looking for under [facility] and page to a number that you can be directly reached. If you still have difficulty reaching the provider, please page the [facility] (Director on Call) for the Hospitalists listed on amion for assistance.

## 2022-04-07 NOTE — Telephone Encounter (Signed)
Please see result note on blood culture Please see how he is doing Obviously if patient is running high fever severely ill he may need to go back to the ER but otherwise this is most likely a contaminated blood culture and not a sign of true infection

## 2022-04-07 NOTE — Telephone Encounter (Signed)
Per Result note: Autumn left message to return call

## 2022-04-08 LAB — CULTURE, BLOOD (ROUTINE X 2): Special Requests: ADEQUATE

## 2022-04-10 LAB — CULTURE, BLOOD (ROUTINE X 2)
Culture: NO GROWTH
Special Requests: ADEQUATE

## 2022-04-10 NOTE — Telephone Encounter (Signed)
Pt returned call and verbalized understanding  

## 2022-04-11 ENCOUNTER — Encounter: Payer: Self-pay | Admitting: Medical

## 2022-04-11 ENCOUNTER — Ambulatory Visit: Payer: 59 | Attending: Medical | Admitting: Medical

## 2022-04-11 VITALS — BP 144/92 | HR 101 | Ht 74.0 in | Wt 294.0 lb

## 2022-04-11 DIAGNOSIS — R55 Syncope and collapse: Secondary | ICD-10-CM

## 2022-04-11 DIAGNOSIS — I779 Disorder of arteries and arterioles, unspecified: Secondary | ICD-10-CM | POA: Diagnosis not present

## 2022-04-11 DIAGNOSIS — E782 Mixed hyperlipidemia: Secondary | ICD-10-CM

## 2022-04-11 DIAGNOSIS — I10 Essential (primary) hypertension: Secondary | ICD-10-CM | POA: Diagnosis not present

## 2022-04-11 DIAGNOSIS — R7989 Other specified abnormal findings of blood chemistry: Secondary | ICD-10-CM | POA: Diagnosis not present

## 2022-04-11 DIAGNOSIS — Z87891 Personal history of nicotine dependence: Secondary | ICD-10-CM

## 2022-04-11 LAB — CULTURE, BLOOD (SINGLE)
Culture: NO GROWTH
Special Requests: ADEQUATE

## 2022-04-11 MED ORDER — ROSUVASTATIN CALCIUM 20 MG PO TABS: 20.0000 mg | ORAL_TABLET | Freq: Every day | ORAL | 3 refills | Status: DC

## 2022-04-11 MED ORDER — METOPROLOL TARTRATE 100 MG PO TABS: 100.0000 mg | ORAL_TABLET | ORAL | 0 refills | Status: DC

## 2022-04-11 MED ORDER — ASPIRIN 81 MG PO TBEC: 81.0000 mg | DELAYED_RELEASE_TABLET | Freq: Every day | ORAL | 3 refills | Status: DC

## 2022-04-11 NOTE — Progress Notes (Signed)
Cardiology Office Note:    Date:  04/11/2022   ID:  [name] Nicolas [name], DOB 03/24/1976, MRN [id number]  PCP:  Nicolas Sciara, MD  [facility] Cardiologist:  None  [facility] Electrophysiologist:  None   Referring MD: Nicolas Fleet, MD   Chief Complaint: 2 week follow-up  History of Present Illness:    Nicolas [name] is a 46 y.o. male with a hx of HTN, tobacco use, syncope, nonobstructive CAD who presents for hospital follow-up.   Patient had a prior left heart cath in August 2015 showing minimal disease.  The heart pump function was preserved.  Patient was seen in the ER 1622 for lightheadedness and shortness of breath.  He felt weak, nauseated and EMS was called.  He received IV fluids. Troponin levels 5, 19, 16.  Last seen 02/03/2021 and was overall stable from a cardiac perspective.  Recentyl admittted late October 2023 for syncope and collapse.  Patient reports he was out for only a couple minutes.  EMS was called who brought him to the ER.  Echocardiogram showed normal LVEF and diastolic dysfunction, normal valves.  EEG was negative.  He was placed on driving restrictions.  Ultrasound of the carotid showed left stenosis 50 to 69% and right stenosis 1 to 49%.  He was noted to have positive blood culture showing gram-positive cocci, it was suspected it was a contaminant.  Patient did not want to stay for further work-up.  He was sent home with a 30-day heart monitor.  Today, the patient  reports most recent syncopal episode was the hospitalization. When he passes out he feels lightheaded and dizzy. Feels heart racing. No chest pain. Short of breath before passing out. Has lost consciousness. Was outpfor maybe a ouple minutes. He does not smoke. He occasinoally drinks alcohol.   Past Medical History:  Diagnosis Date   History of stomach ulcers    Hypertension    Kidney stone    bil , different times  passed stones   Migraines    Pancreatitis     Past Surgical  History:  Procedure Laterality Date   BIOPSY N/A 02/11/2014   Procedure: BIOPSY;  Surgeon: Nicolas Ade, MD;  Location: [facility];  Service: Endoscopy;  Laterality: N/A;   CARDIAC CATHETERIZATION  Aug 2015   normal EF, normal coronaries   CARDIAC CATHETERIZATION     CARPAL TUNNEL RELEASE Right 08/03/2021   Procedure: Right CARPAL TUNNEL RELEASE;  Surgeon: Nicolas Beards, MD;  Location: [facility];  Service: Orthopedics;  Laterality: Right;   CARPAL TUNNEL RELEASE Left 11/09/2021   Procedure: LEFT CARPAL TUNNEL RELEASE;  Surgeon: Nicolas Beards, MD;  Location:  SURGERY CENTER;  Service: Orthopedics;  Laterality: Left;   ESOPHAGOGASTRODUODENOSCOPY (EGD) WITH PROPOFOL N/A 02/11/2014   Procedure: ESOPHAGOGASTRODUODENOSCOPY (EGD) WITH PROPOFOL;  Surgeon: Nicolas Ade, MD;  Location: [facility];  Service: Endoscopy;  Laterality: N/A;   LEFT HEART CATHETERIZATION WITH CORONARY ANGIOGRAM N/A 01/18/2014   Procedure: LEFT HEART CATHETERIZATION WITH CORONARY ANGIOGRAM;  Surgeon: Nicolas Bihari, MD;  Location: [facility] CATH [facility];  Service: Cardiovascular;  Laterality: N/A;    Current Medications: Current Meds  Medication Sig   amLODipine (NORVASC) 5 MG tablet TAKE 1 TABLET BY MOUTH ONCE DAILY.   aspirin EC 81 MG tablet Take 1 tablet (81 mg total) by mouth daily. Swallow whole.   clobetasol ointment (TEMOVATE) 0.05 % APPLY THIN AMOUNT TWICE DAILY TO AFFECTED AREA AS NEEDED.(DO NOT APPLY TO FACIAL AREA)  metoprolol tartrate (LOPRESSOR) 100 MG tablet Take 1 tablet (100 mg total) by mouth as directed. Take Two Hours Prior To CT Scan   rosuvastatin (CRESTOR) 20 MG tablet Take 1 tablet (20 mg total) by mouth daily.   [DISCONTINUED] rosuvastatin (CRESTOR) 5 MG tablet TAKE 1 TABLET BY MOUTH ONCE A DAY.     Allergies:   Patient has no known allergies.   Social History   Socioeconomic History   Marital status: Married    Spouse name: Not on file   Number of children: Not on file   Years  of education: Not on file   Highest education level: Not on file  Occupational History   Not on file  Tobacco Use   Smoking status: Former    Packs/day: 2.00    Years: 10.00    Total pack years: 20.00    Types: Cigarettes    Quit date: 12/05/2020    Years since quitting: 1.3   Smokeless tobacco: Never  Vaping Use   Vaping Use: Never used  Substance and Sexual Activity   Alcohol use: No   Drug use: No   Sexual activity: Yes    Birth control/protection: None  Other Topics Concern   Not on file  Social History Narrative   Not on file   Social Determinants of Health   Financial Resource Strain: Not on file  Food Insecurity: No Food Insecurity (04/06/2022)   Hunger Vital Sign    Worried About Running Out of Food in the Last Year: Never true    Ran Out of Food in the Last Year: Never true  Transportation Needs: No Transportation Needs (04/06/2022)   PRAPARE - Hydrologist (Medical): No    Lack of Transportation (Non-Medical): No  Physical Activity: Not on file  Stress: Not on file  Social Connections: Not on file     Family History: The patient's family history includes Cancer in his mother. There is no history of Colon cancer.  ROS:   Please see the history of present illness.     All other systems reviewed and are negative.  EKGs/Labs/Other Studies Reviewed:    The following studies were reviewed today:  Echo 03/2022  1. Left ventricular ejection fraction, by estimation, is 60 to 65%. The  left ventricle has normal function. The left ventricle has no regional  wall motion abnormalities. Left ventricular diastolic parameters were  normal.   2. Right ventricular systolic function is normal. The right ventricular  size is normal. Tricuspid regurgitation signal is inadequate for assessing  PA pressure.   3. The mitral valve was not well visualized. No evidence of mitral valve  regurgitation. No evidence of mitral stenosis.   4. The  aortic valve was not well visualized. Aortic valve regurgitation  is not visualized. No aortic stenosis is present.   5. The inferior vena cava is dilated in size with >50% respiratory  variability, suggesting right atrial pressure of 8 mmHg.   EKG:  EKG is ordered today.  The ekg ordered today demonstrates normal sinus rhythm, 85 bpm, nonspecific T wave changes  Recent Labs: 04/06/2022: ALT 33; BUN 20; Creatinine, Ser 0.95; Magnesium 2.4; Potassium 4.2; Sodium 138 04/07/2022: Hemoglobin 14.6; Platelets 271  Recent Lipid Panel    Component Value Date/Time   CHOL 136 04/07/2022 0413   CHOL 196 12/09/2020 0810   TRIG 116 04/07/2022 0413   HDL 33 (L) 04/07/2022 0413   HDL 34 (L) 12/09/2020 0810  CHOLHDL 4.1 04/07/2022 0413   VLDL 23 04/07/2022 0413   LDLCALC 80 04/07/2022 0413   LDLCALC 132 (H) 12/09/2020 0810    Physical Exam:    VS:  BP (!) 144/92   Pulse (!) 101   Ht 6\' 2"  (1.88 m)   Wt 294 lb (133.4 kg)   SpO2 96%   BMI 37.75 kg/m     Wt Readings from Last 3 Encounters:  04/11/22 294 lb (133.4 kg)  04/06/22 288 lb 2.3 oz (130.7 kg)  03/28/22 286 lb 6.4 oz (129.9 kg)     GEN:  Well nourished, well developed in no acute distress HEENT: Normal NECK: No JVD; No carotid bruits LYMPHATICS: No lymphadenopathy CARDIAC: RRR, no murmurs, rubs, gallops RESPIRATORY:  Clear to auscultation without rales, wheezing or rhonchi  ABDOMEN: Soft, non-tender, non-distended MUSCULOSKELETAL:  No edema; No deformity  SKIN: Warm and dry NEUROLOGIC:  Alert and oriented x 3 PSYCHIATRIC:  Normal affect   ASSESSMENT:    1. Syncope and collapse   2. Elevated troponin   3. Bilateral carotid artery disease, unspecified type (HCC)   4. Essential hypertension   5. Hyperlipidemia, mixed   6. History of tobacco use    PLAN:    In order of problems listed above:  Syncope and collapse Patient reports 3 episodes of pre-syncope/syncope.  Patient was patiently admitted of October for  syncope and collapse.  Work-up was overall unremarkable.  Ultrasound of the carotids showed 1 to 49% stenosis in the right and 50 to 69% stenosis in the left.  Echo showed normal LVEF, normal RV SF, no valvular abnormalities.  Patient denies recurrent syncopal episode.  The patient brought his heart monitor, we will place it on him today.  EKG today shows normal sinus rhythm with no significant changes.  Orthostatics negative.  We will follow-up after the heart monitor is complete.  Elevated troponin Troponin elevated to 160s in the hospital.  No chest pain reported.  He reports dyspnea prior to syncopal episodes.  Echo in the hospital showed normal LVEF of and normal diastolic parameters. I will order a cardiac CTA.  Prior imaging showed mild nonobstructive CAD. I will start aspirin 81 mg daily. I will increase Crestor to 20 mg daily.  Carotid artery disease Recent carotid ultrasound showed mild 1 to 49% stenosis in the right carotid artery and moderate 50 to 69% stenosis in the left carotid artery.  We will continue to follow with serial ultrasounds.  Continue aspirin and statin.  HLD LDL 80,  Goal less than 70. Increase Crestor to 20 mg daily as above.    HTN Blood pressure is mildly elevated.  He is on amlodipine 5 mg daily.  Patient has been having ED issues, he is unsure about addition/titration of medication.  Can consider adding Coreg for CAD and blood pressure. Can discuss this at follow-up.  History of tobacco use Patient quit smoking in July 2022.  Disposition: Follow up in 6-8 week(s) with MD/APP     Signed, Ifrah Vest August 2022, PA-C  04/11/2022 4:39 PM    [facility]

## 2022-04-11 NOTE — Patient Instructions (Signed)
Medication Instructions:  Increase Crestor to 20 mg Daily  Start Aspirin 81 mg Daily   *If you need a refill on your cardiac medications before your next appointment, please call your pharmacy*   Lab Work: NONE   If you have labs (blood work) drawn today and your tests are completely normal, you will receive your results only by: MyChart Message (if you have MyChart) OR A paper copy in the mail If you have any lab test that is abnormal or we need to change your treatment, we will call you to review the results.   Testing/Procedures:   Your cardiac CT will be scheduled at one of the below locations:   [facility] [address] [place], [place] [address] [phone]  OR  [facility] [address] [place], [place] [address] [phone]  OR   [facility] [address] [place], [place] [address] [phone]  If scheduled at [facility], please arrive at the [facility] and Children's Entrance (Entrance C2) of [facility] 30 minutes prior to test start time. You can use the FREE valet parking offered at entrance C (encouraged to control the heart rate for the test)  Proceed to the [facility] Radiology Department (first floor) to check-in and test prep.  All radiology patients and guests should use entrance C2 at [facility], accessed from [facility], even though the hospital's physical address listed is [address].    If scheduled at [facility] or [facility], please arrive 15 mins early for check-in and test prep.   Please follow these instructions carefully (unless otherwise directed):  Hold all erectile dysfunction medications at least 3 days (72 hrs) prior to test. (Ie viagra, cialis, sildenafil, tadalafil, etc) We will administer nitroglycerin during this exam.   On the Night Before  the Test: Be sure to Drink plenty of water. Do not consume any caffeinated/decaffeinated beverages or chocolate 12 hours prior to your test. Do not take any antihistamines 12 hours prior to your test. On the Day of the Test: Drink plenty of water until 1 hour prior to the test. Do not eat any food 1 hour prior to test. You may take your regular medications prior to the test.  Take metoprolol (Lopressor) two hours prior to test. HOLD Furosemide/Hydrochlorothiazide morning of the test.   *For Clinical Staff only. Please instruct patient the following:* Heart Rate Medication Recommendations for Cardiac CT  Resting HR < 50 bpm  No medication  Resting HR 50-60 bpm and BP >110/50 mmHG   Consider Metoprolol tartrate 25 mg PO 90-120 min prior to scan  Resting HR 60-65 bpm and BP >110/50 mmHG  Metoprolol tartrate 50 mg PO 90-120 minutes prior to scan   Resting HR > 65 bpm and BP >110/50 mmHG  Metoprolol tartrate 100 mg PO 90-120 minutes prior to scan  Consider Ivabradine 10-15 mg PO or a calcium channel blocker for resting HR >60 bpm and contraindication to metoprolol tartrate  Consider Ivabradine 10-15 mg PO in combination with metoprolol tartrate for HR >80 bpm         After the Test: Drink plenty of water. After receiving IV contrast, you may experience a mild flushed feeling. This is normal. On occasion, you may experience a mild rash up to 24 hours after the test. This is not dangerous. If this occurs, you can take Benadryl 25 mg and  increase your fluid intake. If you experience trouble breathing, this can be serious. If it is severe call 911 IMMEDIATELY. If it is mild, please call our office. If you take any of these medications: Glipizide/Metformin, Avandament, Glucavance, please do not take 48 hours after completing test unless otherwise instructed.  We will call to schedule your test 2-4 weeks out understanding that some insurance companies will need an authorization prior to the  service being performed.   For non-scheduling related questions, please contact the cardiac imaging nurse navigator should you have any questions/concerns: Marchia Bond, Cardiac Imaging Nurse Navigator Gordy Clement, Cardiac Imaging Nurse Navigator  Heart and Vascular Services Direct Office Dial: [phone]   For scheduling needs, including cancellations and rescheduling, please call [place], [phone].    Follow-Up: At [facility], you and your health needs are our priority.  As part of our continuing mission to provide you with exceptional heart care, we have created designated Provider Care Teams.  These Care Teams include your primary Cardiologist (physician) and Advanced Practice Providers (APPs -  Physician Assistants and Nurse Practitioners) who all work together to provide you with the care you need, when you need it.  We recommend signing up for the patient portal called "MyChart".  Sign up information is provided on this After Visit Summary.  MyChart is used to connect with patients for Virtual Visits (Telemedicine).  Patients are able to view lab/test results, encounter notes, upcoming appointments, etc.  Non-urgent messages can be sent to your provider as well.   To learn more about what you can do with MyChart, go to [email or web address].    Your next appointment:   6 -8 week(s)  The format for your next appointment:   In Person  Provider:   You will see one of the following Advanced Practice Providers on your designated Care Team:   Bernerd Pho, PA-C  Ermalinda Barrios, PA-C    Other Instructions Thank you for choosing [facility]!    Important Information About Sugar

## 2022-04-12 ENCOUNTER — Ambulatory Visit: Payer: 59 | Attending: Internal Medicine

## 2022-04-12 ENCOUNTER — Encounter: Payer: Self-pay | Admitting: Family Medicine

## 2022-04-12 ENCOUNTER — Ambulatory Visit (INDEPENDENT_AMBULATORY_CARE_PROVIDER_SITE_OTHER): Payer: 59 | Admitting: Family Medicine

## 2022-04-12 VITALS — BP 142/90 | HR 90 | Temp 98.2°F | Ht 74.0 in | Wt 288.0 lb

## 2022-04-12 DIAGNOSIS — I10 Essential (primary) hypertension: Secondary | ICD-10-CM

## 2022-04-12 DIAGNOSIS — I6522 Occlusion and stenosis of left carotid artery: Secondary | ICD-10-CM

## 2022-04-12 DIAGNOSIS — R55 Syncope and collapse: Secondary | ICD-10-CM

## 2022-04-12 MED ORDER — CLOBETASOL PROPIONATE 0.05 % EX OINT: TOPICAL_OINTMENT | CUTANEOUS | 2 refills | Status: DC

## 2022-04-12 MED ORDER — SERTRALINE HCL 50 MG PO TABS: 50.0000 mg | ORAL_TABLET | Freq: Every day | ORAL | 3 refills | Status: DC

## 2022-04-12 NOTE — Progress Notes (Signed)
   Subjective:    Patient ID: Nicolas [name], male    DOB: 11-20-1975, 46 y.o.   MRN: [id number]  HPI Follow up hospiatl follow up , syncopy and collapse , started asa 81 mg Hospital admission as well as hospital discharge summary reviewed Labs were reviewed as well as scans and test Notes from cardiology reviewed Notes from neurology reviewed Patient had an unexplained syncopal spell Patient is undergoing telemetry testing Patient had a normal EEG Patient states he felt the spell coming on then he felt lightheaded then he states he passed out It is noted that the patient had about carotid artery stenosis   Review of Systems     Objective:   Physical Exam  General-in no acute distress Eyes-no discharge Lungs-respiratory rate normal, CTA CV-no murmurs,RRR Extremities skin warm dry no edema Neuro grossly normal Behavior normal, alert       Assessment & Plan:  Hospital follow-up Syncopal episode Very important to get LDL below 70 preferably at 50 Continue Crestor 20 mg Blood pressure mildly elevated recheck again within 2 weeks Your blood pressure is not where it needs to be you will need to add additional medicine Stress related issues along with anxiety GAD Sertraline 50 mg half tablet daily and then 1 tablet daily thereafter Cardiac testing very important patient encouraged to follow through On Syncopal spell Unlikely to be a seizure because he felt it coming on and EEG normal.  We will see neurology as an outpatient It is possible he could have underlying cardiac issue undergoing further work-up currently Patient is able to drive but if he feels lightheaded dizzy or off balance or headache numbness weakness or any similar event do not drive We will set up neurology consult Will do carotid studies in 1 year We will do follow-up lipids within 3 months Blood pressure visit next week to recheck blood pressure

## 2022-04-13 NOTE — Addendum Note (Signed)
Addended by: Vicente Males on: 04/13/2022 11:23 AM   Modules accepted: Orders

## 2022-04-13 NOTE — Progress Notes (Signed)
Urgent referral to neurology placed. Carotid artery placed in reminder file.

## 2022-04-19 ENCOUNTER — Encounter: Payer: Self-pay | Admitting: Family Medicine

## 2022-04-19 ENCOUNTER — Ambulatory Visit (INDEPENDENT_AMBULATORY_CARE_PROVIDER_SITE_OTHER): Payer: 59 | Admitting: Family Medicine

## 2022-04-19 VITALS — BP 132/88 | Wt 292.8 lb

## 2022-04-19 DIAGNOSIS — R0683 Snoring: Secondary | ICD-10-CM

## 2022-04-19 DIAGNOSIS — R739 Hyperglycemia, unspecified: Secondary | ICD-10-CM

## 2022-04-19 NOTE — Progress Notes (Signed)
   Subjective:    Patient ID: Nicolas [name], male    DOB: September 16, 1975, 46 y.o.   MRN: [id number]  HPI Pt arrives for follow up on HTN. No issues with blood pressure at this time.  Patient is taking his medicine Trying to watch diet Trying to minimize salt He has not had any passing out spells Energy level doing okay No chest pressure tightness or shortness of breath Has coronary CTA coming up later this month also has Zio patch which he will turn back in and he waits to hear what it shows  Review of Systems     Objective:   Physical Exam  General-in no acute distress Eyes-no discharge Lungs-respiratory rate normal, CTA CV-no murmurs,RRR Extremities skin warm dry no edema Neuro grossly normal Behavior normal, alert       Assessment & Plan:   Blood pressure is acceptable.  Ideally would like to see a little bit better he is going to start doing little more walking watching diet minimizing salt here follow-up here in early December  He has dental surgery for implants second week in December Has CAT scan for his coronary arteries later in November await the results of this For now continue aspirin

## 2022-04-21 DIAGNOSIS — I10 Essential (primary) hypertension: Secondary | ICD-10-CM | POA: Diagnosis not present

## 2022-04-21 DIAGNOSIS — Z6834 Body mass index (BMI) 34.0-34.9, adult: Secondary | ICD-10-CM | POA: Diagnosis not present

## 2022-04-21 DIAGNOSIS — Z87891 Personal history of nicotine dependence: Secondary | ICD-10-CM | POA: Diagnosis not present

## 2022-04-21 DIAGNOSIS — E669 Obesity, unspecified: Secondary | ICD-10-CM | POA: Diagnosis not present

## 2022-04-21 DIAGNOSIS — R739 Hyperglycemia, unspecified: Secondary | ICD-10-CM | POA: Diagnosis not present

## 2022-04-21 DIAGNOSIS — E785 Hyperlipidemia, unspecified: Secondary | ICD-10-CM | POA: Diagnosis not present

## 2022-04-22 ENCOUNTER — Telehealth: Payer: Self-pay | Admitting: Family Medicine

## 2022-04-22 LAB — HEMOGLOBIN A1C
Est. average glucose Bld gHb Est-mCnc: 134 mg/dL
Hgb A1c MFr Bld: 6.3 % — ABNORMAL HIGH (ref 4.8–5.6)

## 2022-04-22 LAB — TESTOSTERONE: Testosterone: 315 ng/dL (ref 264–916)

## 2022-04-22 NOTE — Telephone Encounter (Signed)
Patient requesting approval for dental implants he has a cardiac test at the end of November if this comes back looking good then we would recommend holding his aspirin 1 week before the procedure he has an appointment in early December we will see how he is doing at that time. Please keep this form available at the nurses station for his December visit thank you

## 2022-04-28 ENCOUNTER — Encounter: Payer: Self-pay | Admitting: Neurology

## 2022-04-28 ENCOUNTER — Inpatient Hospital Stay: Payer: 59 | Admitting: Neurology

## 2022-05-08 ENCOUNTER — Other Ambulatory Visit: Payer: Self-pay | Admitting: Family Medicine

## 2022-05-08 ENCOUNTER — Telehealth (HOSPITAL_COMMUNITY): Payer: Self-pay | Admitting: Emergency Medicine

## 2022-05-08 DIAGNOSIS — I10 Essential (primary) hypertension: Secondary | ICD-10-CM | POA: Diagnosis not present

## 2022-05-08 NOTE — Telephone Encounter (Signed)
Attempted to call patient regarding upcoming cardiac CT appointment. °Left message on voicemail with name and callback number °Maxmillian Carsey RN Navigator Cardiac Imaging °[facility] °[phone] Office °[phone] Cell ° °

## 2022-05-09 LAB — BASIC METABOLIC PANEL
BUN/Creatinine Ratio: 15 (ref 9–20)
BUN: 13 mg/dL (ref 6–24)
CO2: 18 mmol/L — ABNORMAL LOW (ref 20–29)
Calcium: 9 mg/dL (ref 8.7–10.2)
Chloride: 107 mmol/L — ABNORMAL HIGH (ref 96–106)
Creatinine, Ser: 0.84 mg/dL (ref 0.76–1.27)
Glucose: 170 mg/dL — ABNORMAL HIGH (ref 70–99)
Potassium: 4.3 mmol/L (ref 3.5–5.2)
Sodium: 144 mmol/L (ref 134–144)
eGFR: 110 mL/min/{1.73_m2} (ref >59)

## 2022-05-10 ENCOUNTER — Ambulatory Visit (HOSPITAL_COMMUNITY)
Admission: RE | Admit: 2022-05-10 | Discharge: 2022-05-10 | Disposition: A | Discharge status: Home / Self Care | Payer: 59 | Source: Ambulatory Visit | Attending: Medical | Admitting: Medical

## 2022-05-10 ENCOUNTER — Encounter (HOSPITAL_COMMUNITY): Payer: Self-pay

## 2022-05-10 DIAGNOSIS — R9430 Abnormal result of cardiovascular function study, unspecified: Secondary | ICD-10-CM

## 2022-05-10 DIAGNOSIS — R7989 Other specified abnormal findings of blood chemistry: Secondary | ICD-10-CM | POA: Diagnosis present

## 2022-05-10 MED ORDER — METOPROLOL TARTRATE 5 MG/5ML IV SOLN
INTRAVENOUS | Status: AC
  Filled 2022-05-10: qty 10

## 2022-05-10 MED ORDER — NITROGLYCERIN 0.4 MG SL SUBL
SUBLINGUAL_TABLET | SUBLINGUAL | Status: AC
  Filled 2022-05-10: qty 2

## 2022-05-10 MED ORDER — DILTIAZEM HCL 25 MG/5ML IV SOLN
INTRAVENOUS | Status: AC
  Administered 2022-05-10: 5 mg via INTRAVENOUS
  Filled 2022-05-10: qty 5

## 2022-05-10 MED ORDER — DILTIAZEM HCL 25 MG/5ML IV SOLN: 5.0000 mg | Freq: Once | INTRAVENOUS | Status: AC

## 2022-05-10 MED ORDER — IOHEXOL 350 MG/ML SOLN
115.0000 mL | Freq: Once | INTRAVENOUS | Status: AC | PRN
  Administered 2022-05-10: 115 mL via INTRAVENOUS

## 2022-05-10 MED ORDER — METOPROLOL TARTRATE 5 MG/5ML IV SOLN
INTRAVENOUS | Status: AC
  Administered 2022-05-10: 10 mg
  Filled 2022-05-10: qty 10

## 2022-05-11 ENCOUNTER — Encounter: Payer: Self-pay | Admitting: Family Medicine

## 2022-05-11 NOTE — Telephone Encounter (Signed)
Nurses Please pass along the following message to [facility]  Coronary testing looks very good.  In my opinion he does not need to continue the aspirin.  It would be a good idea to follow-up with cardiology as planned.  As for his dental procedure everything looks good that he could proceed forward with that but it is important to be off aspirin at least 1 week before the procedure.  (I believe I did see a form, or way to be filled out and sent to the dental folks please locate this form we can fill this out and send)  Thanks-Dr. Lorin Picket

## 2022-05-17 ENCOUNTER — Ambulatory Visit: Payer: 59 | Admitting: Family Medicine

## 2022-05-17 ENCOUNTER — Encounter: Payer: Self-pay | Admitting: Family Medicine

## 2022-05-17 VITALS — BP 136/82 | HR 90 | Temp 98.5°F | Wt 300.2 lb

## 2022-05-17 DIAGNOSIS — I6529 Occlusion and stenosis of unspecified carotid artery: Secondary | ICD-10-CM | POA: Diagnosis not present

## 2022-05-17 DIAGNOSIS — Z87898 Personal history of other specified conditions: Secondary | ICD-10-CM | POA: Diagnosis not present

## 2022-05-17 DIAGNOSIS — I10 Essential (primary) hypertension: Secondary | ICD-10-CM

## 2022-05-17 DIAGNOSIS — K029 Dental caries, unspecified: Secondary | ICD-10-CM | POA: Diagnosis not present

## 2022-05-17 MED ORDER — HYDROCODONE-ACETAMINOPHEN 10-325 MG PO TABS: 1.0000 | ORAL_TABLET | Freq: Four times a day (QID) | ORAL | 0 refills | Status: AC | PRN

## 2022-05-17 NOTE — Progress Notes (Signed)
05/17/22-Reminder placed in reminder file

## 2022-05-17 NOTE — Progress Notes (Signed)
   Subjective:    Patient ID: Nicolas [name], male    DOB: 11/01/1975, 46 y.o.   MRN: [id number]  HPI Patient arrives today for follow up. No concerns or problems.  He had recent coronary artery test which came back looking great His cardiac monitor is still pending There was referral for appointment with neurologist he states he missed the appointment but he is working on getting that back Has dental procedure coming up for which he was going to have all of his teeth removed and implants put in  Patient has history of morbid obesity, hypertension, carotid artery disease, hyperlipidemia, stress  Previous smoking history but no longer smokes  Patient has had some syncopal spells for which he has had further work-up by cardiology and neurology which have been negative so far  Review of Systems     Objective:   Physical Exam General-in no acute distress Eyes-no discharge Lungs-respiratory rate normal, CTA CV-no murmurs,RRR Extremities skin warm dry no edema Neuro grossly normal Behavior normal, alert        Assessment & Plan:   1. Essential hypertension HTN- patient seen for follow-up regarding HTN.   Diet, medication compliance, appropriate labs and refills were completed.   Importance of keeping blood pressure under good control to lessen the risk of complications discussed Regular follow-up visits discussed   2. Morbid obesity (HCC) Portion control regular physical activity stressed  3. History of syncope Has not had any more spells.  So far work-up negative.  Coronary negative.  Awaiting telemetry testing results It is safe for the patient to drive he has been counseled that if he starts experiencing symptoms he needs to pull off the side of the road summon for help via 911 if necessary lay down if necessary currently right now safe to drive We will reinitiate neurology consult 4. Stenosis of carotid artery, unspecified laterality Left-sided carotid artery  stenosis we already have in the reminder file for him to repeat this next year I would recommend continuing 81 mg aspirin every day as well as cholesterol medicine  5. Dental caries Patient is approved to have his dental surgery but he was reminded right now to stay off of the aspirin for 1 week before and may resume the aspirin 1 to 2 days after the procedure

## 2022-05-17 NOTE — Telephone Encounter (Signed)
Form was filled out please send most recent lab results as well as EKG and office visit with the form thank you

## 2022-05-17 NOTE — Telephone Encounter (Signed)
Pt completed his office visit today. Please advise. Thank you

## 2022-05-21 ENCOUNTER — Telehealth: Payer: Self-pay | Admitting: Family Medicine

## 2022-05-21 NOTE — Telephone Encounter (Signed)
Nurses We are trying to help set him up for sleep study referral Need more information from the patient in order to get this approved Please connect with Loraine Leriche First question we need for him to fill out a [place] questionnaire or a [employer] questionnaire  #2 need for him to write on the questionnaire how he does at nighttime in regards to sleep-such as snoring?  If his wife notes pauses with the breathing?  Daytime sleepiness?  The sooner he can turn this in for my review then we can get moving forward on consultation for sleep study for evaluation of sleep apnea

## 2022-05-22 NOTE — Telephone Encounter (Signed)
MyChart message sent to patient.

## 2022-05-24 ENCOUNTER — Telehealth: Payer: Self-pay | Admitting: Family Medicine

## 2022-05-24 DIAGNOSIS — R0683 Snoring: Secondary | ICD-10-CM

## 2022-05-24 NOTE — Telephone Encounter (Signed)
Pt came by office to pick up [facility] questionnaire.

## 2022-05-24 NOTE — Telephone Encounter (Signed)
[place] questionnaire placed in provider office. Please advise. Thank you

## 2022-05-24 NOTE — Telephone Encounter (Signed)
Patient does have significant snoring He also has pauses with his breathing at night He finds himself fatigued tired during the day and sleepy.  Does not fall asleep with driving Patient would benefit from sleep study. Derinda Late questionnaire is positive Go ahead with referral for sleep study for probable sleep apnea to Dr.Athar practice  Nurses-please send burning questionnaire as well as a copy of this phone message with the referral thank you

## 2022-05-26 NOTE — Addendum Note (Signed)
Addended by: Marlowe Shores on: 05/26/2022 09:14 AM   Modules accepted: Orders

## 2022-05-26 NOTE — Telephone Encounter (Signed)
Referral placed. Pt wife Arline Asp made aware. Referral, [place] questionnaire, and pt message faxed to Dr.Athar office.

## 2022-06-02 ENCOUNTER — Encounter: Payer: Self-pay | Admitting: Nurse Practitioner

## 2022-06-02 ENCOUNTER — Ambulatory Visit: Payer: 59 | Attending: Nurse Practitioner | Admitting: Nurse Practitioner

## 2022-06-02 VITALS — BP 128/82 | HR 71 | Ht 74.0 in | Wt 289.6 lb

## 2022-06-02 DIAGNOSIS — E781 Pure hyperglyceridemia: Secondary | ICD-10-CM

## 2022-06-02 DIAGNOSIS — I10 Essential (primary) hypertension: Secondary | ICD-10-CM | POA: Diagnosis not present

## 2022-06-02 DIAGNOSIS — E785 Hyperlipidemia, unspecified: Secondary | ICD-10-CM

## 2022-06-02 DIAGNOSIS — R55 Syncope and collapse: Secondary | ICD-10-CM

## 2022-06-02 DIAGNOSIS — I779 Disorder of arteries and arterioles, unspecified: Secondary | ICD-10-CM | POA: Diagnosis not present

## 2022-06-02 DIAGNOSIS — E669 Obesity, unspecified: Secondary | ICD-10-CM

## 2022-06-02 DIAGNOSIS — F439 Reaction to severe stress, unspecified: Secondary | ICD-10-CM

## 2022-06-02 NOTE — Progress Notes (Unsigned)
Cardiology Office Note:    Date:  06/02/2022  ID:  Nicolas [name], DOB Oct 07, 1975, MRN [id number]  PCP:  Kathyrn Drown, MD   [facility] Cardiologist:  Chalmers Guest, MD     Referring MD: Kathyrn Drown, MD   CC: Here for follow-up  History of Present Illness:    Nicolas [name] is a 46 y.o. male with a hx of the following:  Carotid artery stenosis HTN GERD ETOH use Obesity HLD, hypertriglyceridemia Stress/anxiety  Patient is a very pleasant 46 year old male with past medical history as mentioned above.  Previous cardiovascular history includes left heart cath performed in 2015 that showed minimal coronary artery disease.  He was evaluated in the ED on November 18, 2020 complaining of shortness of breath and lightheadedness.  Associated symptoms including weakness, nausea, and shortness of breath.  He received IV fluids.  Troponins were unremarkable.  He was later sent home.  First evaluated by Dr. Dorris Carnes on February 03, 2021.  Overall was doing well from a cardiac perspective, did note occasional palpitations.  Did admit to being very active and working outside, as well as bilateral carpal tunnel syndrome.  Was told to follow-up as needed.  He was admitted in October 2023 for syncope and collapse, was out for couple minutes.  He was transported to the ED by EMS.  Echocardiogram revealed normal EF, normal heart valves, diastolic dysfunction.  EEG was negative.  Was placed on driving restrictions.  Carotid ultrasound revealed 50 to 69% stenosis along left ICA, right ICA stenosis was 1 to 49%.  Did have positive blood cultures that revealed gram-positive cocci.  Was sent home with 30-day heart monitor.    He was evaluated by Cadence Jorene Minors on April 11, 2022 for hospital follow-up. He denied any recurrent syncopal episodes.  He was short of breath before passing out but denied any chest pain.  Lost consciousness for around a few minutes.  Denied  tobacco use, admitted to occasionally drinking alcohol.  Orthostatics were negative in office that day.  Cardiac CTA was ordered and revealed coronary calcium score 0.  No evidence of CAD. 30-day monitor results were pending.   Today he presents for office follow-up with his family.  He states he is doing well. He denies any recent cardiac complaints or concerns. Denies any more episodes of syncope. Denies any chest pain, shortness of breath, palpitations, near-syncope, orthopnea, PND, swelling, significant weight changes, acute bleeding, or claudication. BP well controlled.  Does admit to stress and fatigue, and possible history of vertigo.  Wife who is present in the room thinks Zoloft is causing poor motiviation. Works in [employer]. Denies any other questions or concerns today. I have updated his chart.   Past Medical History:  Diagnosis Date   Anxiety    Carotid artery disease ([place])    History of stomach ulcers    Hypertension    Hypertriglyceridemia    Kidney stone    bil , different times  passed stones   Migraines    Obesity (BMI 30-39.9)    Pancreatitis    Stress    Syncope and collapse     Past Surgical History:  Procedure Laterality Date   BIOPSY N/A 02/11/2014   Procedure: BIOPSY;  Surgeon: Daneil Dolin, MD;  Location: [facility];  Service: Endoscopy;  Laterality: N/A;   CARDIAC CATHETERIZATION  Aug 2015   normal EF, normal coronaries   CARDIAC CATHETERIZATION     CARPAL  TUNNEL RELEASE Right 08/03/2021   Procedure: Right CARPAL TUNNEL RELEASE;  Surgeon: Sherilyn Cooter, MD;  Location: [place];  Service: Orthopedics;  Laterality: Right;   CARPAL TUNNEL RELEASE Left 11/09/2021   Procedure: LEFT CARPAL TUNNEL RELEASE;  Surgeon: Sherilyn Cooter, MD;  Location: [place];  Service: Orthopedics;  Laterality: Left;   ESOPHAGOGASTRODUODENOSCOPY (EGD) WITH PROPOFOL N/A 02/11/2014   Procedure: ESOPHAGOGASTRODUODENOSCOPY (EGD) WITH PROPOFOL;  Surgeon:  Daneil Dolin, MD;  Location: [facility];  Service: Endoscopy;  Laterality: N/A;   LEFT HEART CATHETERIZATION WITH CORONARY ANGIOGRAM N/A 01/18/2014   Procedure: LEFT HEART CATHETERIZATION WITH CORONARY ANGIOGRAM;  Surgeon: Troy Sine, MD;  Location: [facility] CATH LAB;  Service: Cardiovascular;  Laterality: N/A;    Current Medications: Current Meds  Medication Sig   amLODipine (NORVASC) 5 MG tablet TAKE 1 TABLET BY MOUTH ONCE DAILY.   aspirin EC 81 MG tablet Take 1 tablet (81 mg total) by mouth daily. Swallow whole.   rosuvastatin (CRESTOR) 20 MG tablet Take 1 tablet (20 mg total) by mouth daily.   sertraline (ZOLOFT) 50 MG tablet Take 1 tablet (50 mg total) by mouth daily.     Allergies:   Patient has no known allergies.   Social History   Socioeconomic History   Marital status: Married    Spouse name: Not on file   Number of children: Not on file   Years of education: Not on file   Highest education level: Not on file  Occupational History   Not on file  Tobacco Use   Smoking status: Former    Packs/day: 2.00    Years: 10.00    Total pack years: 20.00    Types: Cigarettes    Quit date: 12/05/2020    Years since quitting: 1.4   Smokeless tobacco: Never  Vaping Use   Vaping Use: Never used  Substance and Sexual Activity   Alcohol use: No   Drug use: No   Sexual activity: Yes    Birth control/protection: None  Other Topics Concern   Not on file  Social History Narrative   Not on file   Social Determinants of Health   Financial Resource Strain: Not on file  Food Insecurity: No Food Insecurity (04/06/2022)   Hunger Vital Sign    Worried About Running Out of Food in the Last Year: Never true    Ran Out of Food in the Last Year: Never true  Transportation Needs: No Transportation Needs (04/06/2022)   PRAPARE - Hydrologist (Medical): No    Lack of Transportation (Non-Medical): No  Physical Activity: Not on file  Stress: Not on file  Social  Connections: Not on file     Family History: The patient's family history includes Cancer in his mother. There is no history of Colon cancer.  ROS:   Review of Systems  Constitutional: Negative.   HENT: Negative.    Eyes: Negative.   Respiratory: Negative.    Cardiovascular: Negative.   Gastrointestinal: Negative.   Genitourinary: Negative.   Musculoskeletal: Negative.   Skin: Negative.   Neurological:  Positive for dizziness. Negative for tingling, tremors, sensory change, speech change, focal weakness, seizures, loss of consciousness, weakness and headaches.       See HPI.   Endo/Heme/Allergies: Negative.   Psychiatric/Behavioral: Negative.         See HPI.     Please see the history of present illness.    All other systems  reviewed and are negative.  EKGs/Labs/Other Studies Reviewed:    The following studies were reviewed today:   EKG:  EKG is not ordered today.   Coronary CTA on May 11, 2022: 1.  Coronary calcium score 0. 2.  No evidence of CAD. 3.  No significant extracardiac findings.  30-day monitor on April 12, 2022: Results are not available.    Bilateral carotid duplex on April 06, 2022: IMPRESSION: 1. Mild (1-49%) stenosis proximal right internal carotid artery secondary to heterogenous atherosclerotic plaque. 2. Moderate (50-69%) stenosis proximal left internal carotid artery secondary to heterogenous atherosclerotic plaque. 3. Vertebral arteries are patent with normal antegrade flow.  Echocardiogram on April 06, 2022:  1. Left ventricular ejection fraction, by estimation, is 60 to 65%. The  left ventricle has normal function. The left ventricle has no regional  wall motion abnormalities. Left ventricular diastolic parameters were  normal.   2. Right ventricular systolic function is normal. The right ventricular  size is normal. Tricuspid regurgitation signal is inadequate for assessing  PA pressure.   3. The mitral valve was not well  visualized. No evidence of mitral valve  regurgitation. No evidence of mitral stenosis.   4. The aortic valve was not well visualized. Aortic valve regurgitation  is not visualized. No aortic stenosis is present.   5. The inferior vena cava is dilated in size with >50% respiratory  variability, suggesting right atrial pressure of 8 mmHg.  Recent Labs: 04/06/2022: ALT 33; Magnesium 2.4 04/07/2022: Hemoglobin 14.6; Platelets 271 05/08/2022: BUN 13; Creatinine, Ser 0.84; Potassium 4.3; Sodium 144  Recent Lipid Panel    Component Value Date/Time   CHOL 136 04/07/2022 0413   CHOL 196 12/09/2020 0810   TRIG 116 04/07/2022 0413   HDL 33 (L) 04/07/2022 0413   HDL 34 (L) 12/09/2020 0810   CHOLHDL 4.1 04/07/2022 0413   VLDL 23 04/07/2022 0413   LDLCALC 80 04/07/2022 0413   LDLCALC 132 (H) 12/09/2020 0810    Physical Exam:    VS:  BP 128/82   Pulse 71   Ht 6\' 2"  (1.88 m)   Wt 289 lb 9.6 oz (131.4 kg)   SpO2 97%   BMI 37.18 kg/m     Wt Readings from Last 3 Encounters:  06/02/22 289 lb 9.6 oz (131.4 kg)  05/17/22 (!) 300 lb 3.2 oz (136.2 kg)  04/19/22 292 lb 12.8 oz (132.8 kg)     GEN: Obese, 46 y.o. male in no acute distress HEENT: Normal NECK: No JVD; No carotid bruits CARDIAC: S1/S2, RRR, no murmurs, rubs, gallops; 2+ peripheral pulses throughout, strong equal bilaterally RESPIRATORY:  Clear to auscultation without rales, wheezing or rhonchi  MUSCULOSKELETAL:  No edema; No deformity  SKIN: Warm and dry NEUROLOGIC:  Alert and oriented x 3 PSYCHIATRIC:  Normal affect   ASSESSMENT:    1. Syncope and collapse   2. Bilateral carotid artery disease, unspecified type (HCC)   3. Hypertension, unspecified type   4. Hyperlipidemia, unspecified hyperlipidemia type   5. Hypertriglyceridemia   6. Obesity (BMI 30-39.9)   7. Stress    PLAN:    In order of problems listed above:  Syncope and collapse No recurrent episodes of near syncope or syncope. Echo normal. No hx of  orthostatic hypotension. Past EEG negative. Results are not available for 30 day monitor.  End of service report did not show any evidence of A-fib.  Readings revealed sinus rhythm, rate controlled.  It appears that reading physician was Dr. 54  Ross.  Will CC her to see final reports.  Continue current medication regimen. Was previously placed on driving restrictions, he verbalized understanding. Heart healthy diet and regular cardiovascular exercise encouraged. Encouraged to continue to follow with PCP as he may have vertigo.   Carotid artery disease Carotid Doppler in 03/2022 revealed mild stenosis (149%) proximal right internal carotid artery secondary to atherosclerotic plaque.  Moderate stenosis (50 to 69%) stenosis proximal left internal carotid artery secondary to atherosclerotic plaque.  Vertebral arteries were patent with normal antegrade flow.  Continue aspirin and rosuvastatin. Heart healthy diet and regular cardiovascular exercise encouraged.   HTN BP today, 128/82.  BP well-controlled at home.  Continue amlodipine. Discussed to monitor BP at home at least 2 hours after medications and sitting for 5-10 minutes. Heart healthy diet and regular cardiovascular exercise encouraged.   HLD, hypertriglyceridemia Recent labs revealed total cholesterol 136, HDL 33, LDL 80, and triglycerides 116.  Continue rosuvastatin. Heart healthy diet and regular cardiovascular exercise encouraged.   Obesity BMI today 37.18.  Has lost over 10 pounds since early this month.  Had recent mouth/oral surgery that led to this. Weight loss via diet and exercise encouraged. Discussed the impact being overweight would have on cardiovascular risk. Heart healthy diet and regular cardiovascular exercise encouraged.   6.  Stress He says he has had stress recently.  Was recently started on Zoloft by PCP. Wife and patient feel like this medication is not helping.  I recommended to follow-up with PCP to discuss options.  They  both verbalized understanding.    7. Disposition: Follow-up with Dr. Dellia Cloud in 3 months or sooner if anything changes.   Medication Adjustments/Labs and Tests Ordered: Current medicines are reviewed at length with the patient today.  Concerns regarding medicines are outlined above.  No orders of the defined types were placed in this encounter.  No orders of the defined types were placed in this encounter.   Patient Instructions  Medication Instructions:  Continue all current medications.  Labwork: none  Testing/Procedures: none  Follow-Up: 3 months - establish with Dr. Dellia Cloud   Any Other Special Instructions Will Be Listed Below (If Applicable).   If you need a refill on your cardiac medications before your next appointment, please call your pharmacy.    Signed, Finis Bud, NP  06/05/2022 8:22 AM    [place]

## 2022-06-02 NOTE — Patient Instructions (Addendum)
Medication Instructions:  Continue all current medications.  Labwork: none  Testing/Procedures: none  Follow-Up: 3 months - establish with Dr. Jenene Slicker   Any Other Special Instructions Will Be Listed Below (If Applicable).   If you need a refill on your cardiac medications before your next appointment, please call your pharmacy.

## 2022-06-05 ENCOUNTER — Encounter: Payer: Self-pay | Admitting: Nurse Practitioner

## 2022-07-06 ENCOUNTER — Ambulatory Visit
Admission: RE | Admit: 2022-07-06 | Discharge: 2022-07-06 | Disposition: A | Discharge status: Home / Self Care | Payer: 59 | Source: Ambulatory Visit

## 2022-07-06 VITALS — BP 159/95 | HR 94 | Temp 98.2°F | Resp 20

## 2022-07-06 DIAGNOSIS — J208 Acute bronchitis due to other specified organisms: Secondary | ICD-10-CM | POA: Diagnosis not present

## 2022-07-06 MED ORDER — ALBUTEROL SULFATE HFA 108 (90 BASE) MCG/ACT IN AERS: 2.0000 | INHALATION_SPRAY | RESPIRATORY_TRACT | 0 refills | Status: DC | PRN

## 2022-07-06 MED ORDER — PROMETHAZINE-DM 6.25-15 MG/5ML PO SYRP: 5.0000 mL | ORAL_SOLUTION | Freq: Four times a day (QID) | ORAL | 0 refills | Status: DC | PRN

## 2022-07-06 MED ORDER — PREDNISONE 20 MG PO TABS: 40.0000 mg | ORAL_TABLET | Freq: Every day | ORAL | 0 refills | Status: DC

## 2022-07-06 NOTE — ED Triage Notes (Signed)
Pt reports cough and shortness of breath x 6 days. Cough is worse at night, and feel hot at night. Nyquil gives some relief.   Report 3 negative COVID test.

## 2022-07-06 NOTE — ED Provider Notes (Signed)
[facility]    CSN: [id number] Arrival date & time: 07/06/22  1519      History   Chief Complaint Chief Complaint  Patient presents with   Appointment    1530   Cough   Shortness of Breath         HPI Nicolas [name] is a 47 y.o. male.   Patient presenting today with almost a week of cough, chest tightness, shortness of breath, sore throat, congestion, body aches, sweats.  Denies chest pain, abdominal pain, nausea vomiting or diarrhea.  Symptoms are much worse at night or with exertion per patient.  States NyQuil provides some mild temporary relief.  Has taken multiple COVID test at this time with negative results.  No known history of chronic pulmonary disease.  Former cigarette smoker.    Past Medical History:  Diagnosis Date   Anxiety    Carotid artery disease ([place])    History of stomach ulcers    Hypertension    Hypertriglyceridemia    Kidney stone    bil , different times  passed stones   Migraines    Obesity (BMI 30-39.9)    Pancreatitis    Stress    Syncope and collapse     Patient Active Problem List   Diagnosis Date Noted   Stenosis of carotid artery 05/17/2022   Syncope 04/06/2022   Elevated troponin 04/06/2022   Hyperlipidemia 04/06/2022   GERD (gastroesophageal reflux disease) 04/06/2022   AKI (acute kidney injury) ([place]) 04/06/2022   Hyperglycemia 04/06/2022   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   Bilateral carpal tunnel syndrome 06/02/2021   Peptic ulcer disease 02/10/2014   Gastric ulcer 02/09/2014   Morbid obesity ([place]) 02/01/2014   Alcohol use 01/31/2014   Essential hypertension 10/24/2012    Past Surgical History:  Procedure Laterality Date   BIOPSY N/A 02/11/2014   Procedure: BIOPSY;  Surgeon: Daneil Dolin, MD;  Location: [facility];  Service: Endoscopy;  Laterality: N/A;   CARDIAC CATHETERIZATION  Aug 2015   normal EF, normal coronaries   CARDIAC CATHETERIZATION     CARPAL TUNNEL RELEASE Right 08/03/2021   Procedure: Right  CARPAL TUNNEL RELEASE;  Surgeon: Sherilyn Cooter, MD;  Location: [place];  Service: Orthopedics;  Laterality: Right;   CARPAL TUNNEL RELEASE Left 11/09/2021   Procedure: LEFT CARPAL TUNNEL RELEASE;  Surgeon: Sherilyn Cooter, MD;  Location: [place];  Service: Orthopedics;  Laterality: Left;   ESOPHAGOGASTRODUODENOSCOPY (EGD) WITH PROPOFOL N/A 02/11/2014   Procedure: ESOPHAGOGASTRODUODENOSCOPY (EGD) WITH PROPOFOL;  Surgeon: Daneil Dolin, MD;  Location: [facility];  Service: Endoscopy;  Laterality: N/A;   LEFT HEART CATHETERIZATION WITH CORONARY ANGIOGRAM N/A 01/18/2014   Procedure: LEFT HEART CATHETERIZATION WITH CORONARY ANGIOGRAM;  Surgeon: Troy Sine, MD;  Location[facility] C[facility];  Service: Cardiovascular;  Laterality: N/A;       Home Medications    Prior to Admission medications   Medication Sig Start Date End Date Taking? Authorizing Provider  albuterol (VENTOLIN HFA) 108 (90 Base) MCG/ACT inhaler Inhale 2 puffs into the lungs every 4 (four) hours as needed for wheezing or shortness of breath. 07/06/22  Yes Volney American, PA-C  predniSONE (DELTASONE) 20 MG tablet Take 2 tablets (40 mg total) by mouth daily with breakfast. 07/06/22  Yes Volney American, PA-C  promethazine-dextromethorphan (PROMETHAZINE-DM) 6.25-15 MG/5ML syrup Take 5 mLs by mouth 4 (four) times daily as needed. 07/06/22  Yes Volney American, PA-C  Pseudoeph-Doxylamine-DM-APAP (NYQUIL PO) Take by  mouth.   Yes [provider]  amLODipine (NORVASC) 5 MG tablet TAKE 1 TABLET BY MOUTH ONCE DAILY. 05/09/22   [name], MD  aspirin EC 81 MG tablet Take 1 tablet (81 mg total) by mouth daily. Swallow whole. 04/11/22   Furth, Cadence H, PA-C  rosuvastatin (CRESTOR) 20 MG tablet Take 1 tablet (20 mg total) by mouth daily. 04/11/22 04/06/23  Furth, Cadence H, PA-C  sertraline (ZOLOFT) 50 MG tablet Take 1 tablet (50 mg total) by mouth daily. 04/12/22   [name],  MD    Family History Family History  Problem Relation Age of Onset   Cancer Mother        Breast   Colon cancer Neg Hx     Social History Social History   Tobacco Use   Smoking status: Former    Packs/day: 2.00    Years: 10.00    Total pack years: 20.00    Types: Cigarettes    Quit date: 12/05/2020    Years since quitting: 1.5   Smokeless tobacco: Never  Vaping Use   Vaping Use: Never used  Substance Use Topics   Alcohol use: No   Drug use: No     Allergies   Patient has no known allergies.   Review of Systems Review of Systems PER HPI  Physical Exam Triage Vital Signs ED Triage Vitals  Enc Vitals Group     BP 07/06/22 1530 (!) 159/95     Pulse Rate 07/06/22 1530 94     Resp 07/06/22 1530 20     Temp 07/06/22 1530 98.2 F (36.8 C)     Temp Source 07/06/22 1530 Oral     SpO2 07/06/22 1530 96 %     Weight --      Height --      Head Circumference --      Peak Flow --      Pain Score 07/06/22 1534 0     Pain Loc --      Pain Edu? --      Excl. in GC? --    No data found.  Updated Vital Signs BP (!) 159/95 (BP Location: Right Arm)   Pulse 94   Temp 98.2 F (36.8 C) (Oral)   Resp 20   SpO2 96%   Visual Acuity Right Eye Distance:   Left Eye Distance:   Bilateral Distance:    Right Eye Near:   Left Eye Near:    Bilateral Near:     Physical Exam Vitals and nursing note reviewed.  Constitutional:      Appearance: He is well-developed.  HENT:     Head: Atraumatic.     Right Ear: External ear normal.     Left Ear: External ear normal.     Nose: Rhinorrhea present.     Mouth/Throat:     Pharynx: Posterior oropharyngeal erythema present. No oropharyngeal exudate.  Eyes:     Conjunctiva/sclera: Conjunctivae normal.     Pupils: Pupils are equal, round, and reactive to light.  Cardiovascular:     Rate and Rhythm: Normal rate and regular rhythm.  Pulmonary:     Effort: Pulmonary effort is normal. No respiratory distress.     Breath sounds:  No wheezing or rales.  Musculoskeletal:        General: Normal range of motion.     Cervical back: Normal range of motion and neck supple.  Lymphadenopathy:     Cervical: No cervical adenopathy.  Skin:    General: Skin is warm and dry.  Neurological:     Mental Status: He is alert and oriented to person, place, and time.  Psychiatric:        Behavior: Behavior normal.      UC Treatments / Results  Labs (all labs ordered are listed, but only abnormal results are displayed) Labs Reviewed - No data to display  EKG   Radiology No results found.  Procedures Procedures (including critical care time)  Medications Ordered in UC Medications - No data to display  Initial Impression / Assessment and Plan / UC Course  I have reviewed the triage vital signs and the nursing notes.  Pertinent labs & imaging results that were available during my care of the patient were reviewed by me and considered in my medical decision making (see chart for details).     Mildly hypertensive in triage, otherwise vital signs within normal limits.  Suspect viral bronchitis.  Treat with prednisone, albuterol inhaler, Phenergan DM, over-the-counter supportive medications and home care.  Return for worsening symptoms.  Final Clinical Impressions(s) / UC Diagnoses   Final diagnoses:  Viral bronchitis     Discharge Instructions      You may take Flonase nasal spray twice daily, plain Mucinex, Tylenol, ibuprofen to help with your symptoms.  Drink plenty of fluids, get lots of rest    ED Prescriptions     Medication Sig Dispense Auth. Provider   predniSONE (DELTASONE) 20 MG tablet Take 2 tablets (40 mg total) by mouth daily with breakfast. 10 tablet Volney American, PA-C   albuterol (VENTOLIN HFA) 108 (90 Base) MCG/ACT inhaler Inhale 2 puffs into the lungs every 4 (four) hours as needed for wheezing or shortness of breath. [place], [place]   promethazine-dextromethorphan  (PROMETHAZINE-DM) 6.25-15 MG/5ML syrup Take 5 mLs by mouth 4 (four) times daily as needed. 100 mL Volney American, [place]      PDMP not reviewed this encounter.   Volney American, [place] 07/06/22 1551

## 2022-07-06 NOTE — Discharge Instructions (Signed)
You may take Flonase nasal spray twice daily, plain Mucinex, Tylenol, ibuprofen to help with your symptoms.  Drink plenty of fluids, get lots of rest

## 2022-08-01 ENCOUNTER — Other Ambulatory Visit: Payer: Self-pay | Admitting: Family Medicine

## 2022-09-01 ENCOUNTER — Other Ambulatory Visit: Payer: Self-pay | Admitting: Family Medicine

## 2022-09-01 DIAGNOSIS — I10 Essential (primary) hypertension: Secondary | ICD-10-CM

## 2022-09-04 ENCOUNTER — Ambulatory Visit: Payer: 59 | Attending: Internal Medicine | Admitting: Internal Medicine

## 2022-09-04 ENCOUNTER — Encounter: Payer: Self-pay | Admitting: Internal Medicine

## 2022-09-04 VITALS — BP 138/82 | HR 92 | Ht 75.0 in | Wt 299.6 lb

## 2022-09-04 DIAGNOSIS — I779 Disorder of arteries and arterioles, unspecified: Secondary | ICD-10-CM | POA: Diagnosis not present

## 2022-09-04 NOTE — Patient Instructions (Addendum)
Medication Instructions:  Your physician recommends that you continue on your current medications as directed. Please refer to the Current Medication list given to you today.  Labwork: none  Testing/Procedures: Your physician has requested that you have a carotid duplex in 7 months This test is an ultrasound of the carotid arteries in your neck. It looks at blood flow through these arteries that supply the brain with blood. Allow one hour for this exam. There are no restrictions or special instructions.  Follow-Up: Your physician recommends that you schedule a follow-up appointment in: as needed  Any Other Special Instructions Will Be Listed Below (If Applicable).  If you need a refill on your cardiac medications before your next appointment, please call your pharmacy.

## 2022-09-04 NOTE — Progress Notes (Signed)
Cardiology Office Note  Date: 09/04/2022   ID: [name], DOB 1976-03-05, MRN [id number]  PCP:  Kathyrn Drown, MD  Cardiologist:  Chalmers Guest, MD Electrophysiologist:  None   Reason for Office Visit: Follow-up of syncope   History of Present Illness: Nicolas [name] is a 47 y.o. male known to have HTN, HLD, carotid artery stenosis presented to the cardiology clinic for follow-up of syncope.  Patient was admitted in 03/2022 for syncope. Per patient, he had acute onset of SOB followed by loss of consciousness. He called 911 before he lost consciousness and the next minute he woke up was in the hospital. 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 He had 3 spells so far and the last episode was in 03/2022. All the cardiac workup has been negative, echocardiogram showed normal LVEF and no valve abnormalities, 30-day event monitor showed no evidence of pauses or arrhythmias and CT calcium scoring of coronaries is 0. He presents today for follow-up visit. He denies any symptoms of chest pain, SOB, fatigue, palpitations, syncope, leg swelling. No recurrent syncopal spells after the last episode. His carotid artery ultrasound showed L carotid artery stenosis, 50 to 69% and R carotid artery stenosis, 1 to 49%.  Past Medical History:  Diagnosis Date   Anxiety    Carotid artery disease ([place])    History of stomach ulcers    Hypertension    Hypertriglyceridemia    Kidney stone    bil , different times  passed stones   Migraines    Obesity (BMI 30-39.9)    Pancreatitis    Stress    Syncope and collapse     Past Surgical History:  Procedure Laterality Date   BIOPSY N/A 02/11/2014   Procedure: BIOPSY;  Surgeon: Daneil Dolin, MD;  Location: [facility];  Service: Endoscopy;  Laterality: N/A;   CARDIAC CATHETERIZATION  Aug 2015   normal EF, normal coronaries   CARDIAC CATHETERIZATION     CARPAL TUNNEL RELEASE Right 08/03/2021   Procedure: Right CARPAL TUNNEL  RELEASE;  Surgeon: Sherilyn Cooter, MD;  Location: [facility];  Service: Orthopedics;  Laterality: Right;   CARPAL TUNNEL RELEASE Left 11/09/2021   Procedure: LEFT CARPAL TUNNEL RELEASE;  Surgeon: Sherilyn Cooter, MD;  Location: [facility];  Service: Orthopedics;  Laterality: Left;   ESOPHAGOGASTRODUODENOSCOPY (EGD) WITH PROPOFOL N/A 02/11/2014   Procedure: ESOPHAGOGASTRODUODENOSCOPY (EGD) WITH PROPOFOL;  Surgeon: Daneil Dolin, MD;  Location: [facility];  Service: Endoscopy;  Laterality: N/A;   LEFT HEART CATHETERIZATION WITH CORONARY ANGIOGRAM N/A 01/18/2014   Procedure: LEFT HEART CATHETERIZATION WITH CORONARY ANGIOGRAM;  Surgeon: Troy Sine, MD;  Location: [facility] C[facility];  Service: Cardiovascular;  Laterality: N/A;    Current Outpatient Medications  Medication Sig Dispense Refill   albuterol (VENTOLIN HFA) 108 (90 Base) MCG/ACT inhaler Inhale 2 puffs into the lungs every 4 (four) hours as needed for wheezing or shortness of breath. 18 g 0   amLODipine (NORVASC) 5 MG tablet TAKE 1 TABLET BY MOUTH ONCE DAILY. 30 tablet 1   aspirin EC 81 MG tablet Take 1 tablet (81 mg total) by mouth daily. Swallow whole. 90 tablet 3   clobetasol ointment (TEMOVATE) 0.05 % APPLY TO AFFECTED AREAS TWICE DAILY AS NEEDED. DO NOT APPLY TO FACE. 45 g 0   rosuvastatin (CRESTOR) 20 MG tablet Take 1 tablet (20 mg total) by mouth daily. 90 tablet 3   No  current facility-administered medications for this visit.   Allergies:  Patient has no known allergies.   Social History: The patient  reports that he quit smoking about 20 months ago. His smoking use included cigarettes. He has a 20.00 pack-year smoking history. He has never used smokeless tobacco. He reports that he does not drink alcohol and does not use drugs.   Family History: The patient's family history includes Cancer in his mother.   ROS:  Please see the history of present illness. Otherwise, complete review of systems is positive  for none.  All other systems are reviewed and negative.   Physical Exam: VS:  BP 138/82   Pulse 92   Ht 6\' 3"  (1.905 m)   Wt 299 lb 9.6 oz (135.9 kg)   SpO2 95%   BMI 37.45 kg/m , BMI Body mass index is 37.45 kg/m.  Wt Readings from Last 3 Encounters:  09/04/22 299 lb 9.6 oz (135.9 kg)  06/02/22 289 lb 9.6 oz (131.4 kg)  05/17/22 (!) 300 lb 3.2 oz (136.2 kg)    General: Patient appears comfortable at rest. HEENT: Conjunctiva and lids normal, oropharynx clear with moist mucosa. Neck: Supple, no elevated JVP or carotid bruits, no thyromegaly. Lungs: Clear to auscultation, nonlabored breathing at rest. Cardiac: Regular rate and rhythm, no S3 or significant systolic murmur, no pericardial rub. Abdomen: Soft, nontender, no hepatomegaly, bowel sounds present, no guarding or rebound. Extremities: No pitting edema, distal pulses 2+. Skin: Warm and dry. Musculoskeletal: No kyphosis. Neuropsychiatric: Alert and oriented x3, affect grossly appropriate.  ECG:  NSR  Recent Labwork: 04/06/2022: ALT 33; AST 23; Magnesium 2.4 04/07/2022: Hemoglobin 14.6; Platelets 271 05/08/2022: BUN 13; Creatinine, Ser 0.84; Potassium 4.3; Sodium 144     Component Value Date/Time   CHOL 136 04/07/2022 0413   CHOL 196 12/09/2020 0810   TRIG 116 04/07/2022 0413   HDL 33 (L) 04/07/2022 0413   HDL 34 (L) 12/09/2020 0810   CHOLHDL 4.1 04/07/2022 0413   VLDL 23 04/07/2022 0413   LDLCALC 80 04/07/2022 0413   LDLCALC 132 (H) 12/09/2020 0810    Other Studies Reviewed Today:   Assessment and Plan: Patient is a 47 year old M known to have HTN, HLD presented to cardiology clinic for follow-up visit.  # Syncope, noncardiac -Echocardiogram showed normal LVEF and no valve abnormalities. CT calcium scoring of coronaries is 0. 30-day event monitor did not show any evidence of pauses or arrhythmias.  # HLD -Continue rosuvastatin 20 mg nightly  # HTN, controlled -Continue amlodipine 5 mg once daily  #  Left carotid artery stenosis, more than 50% -Continue aspirin 81 mg once daily and rosuvastatin 20 mg nightly. Will obtain USG ultrasound of carotids in 7 months.  Educated patient about symptoms of carotid artery stenosis including upper extremity weakness/numbness and dysarthria.  Syncope/dizziness is not a symptom of CAS.   I have spent a total of 30 minutes with patient reviewing chart, EKGs, labs and examining patient as well as establishing an assessment and plan that was discussed with the patient.  > 50% of time was spent in direct patient care.      Medication Adjustments/Labs and Tests Ordered: Current medicines are reviewed at length with the patient today.  Concerns regarding medicines are outlined above.   Tests Ordered: Orders Placed This Encounter  Procedures   VAS US CAROTID    Medication Changes: No orders of the defined types were placed in this encounter.   Disposition:  Follow up prn  Signed Satoria Dunlop Fidel Levy, MD, 09/04/2022 4:11 PM    [facility], [place], [place] [address]

## 2022-09-27 ENCOUNTER — Encounter: Payer: Self-pay | Admitting: Family Medicine

## 2022-10-02 ENCOUNTER — Telehealth: Payer: Self-pay | Admitting: Family Medicine

## 2022-10-02 DIAGNOSIS — I10 Essential (primary) hypertension: Secondary | ICD-10-CM

## 2022-10-02 DIAGNOSIS — R7989 Other specified abnormal findings of blood chemistry: Secondary | ICD-10-CM

## 2022-10-02 DIAGNOSIS — E785 Hyperlipidemia, unspecified: Secondary | ICD-10-CM

## 2022-10-02 DIAGNOSIS — R739 Hyperglycemia, unspecified: Secondary | ICD-10-CM

## 2022-10-02 NOTE — Telephone Encounter (Signed)
Nurses May use OTC Claritin loratadine 10 mg 1 every day Flonase 2 sprays each nostril daily with 2 refills If this does not improve over the next 7 to 10 days with this treatment I highly recommend an office visit to be seen

## 2022-10-02 NOTE — Telephone Encounter (Signed)
Nurses at this point I would recommend for [facility] to do follow-up labs A1c, lipid, metabolic 7, testosterone This blood work needs to be done fasting Diagnosis prediabetes, hyperlipidemia, low testosterone He needs to do these fasting If the testosterone level is low on this blood draw by protocol we would have to repeat the testosterone again in 30 days-FYI Also if testosterone is low we would do testosterone supplementation through specialist But if testosterone level is within normal limits then they would not be doing any type of supplementation  Continuing to eat healthy and stay active is wise as well  Regular follow-up visit sometime this spring/early summer would be wise  His previous testosterone level was low normal-FYI

## 2022-10-02 NOTE — Telephone Encounter (Signed)
Patient states last two weeks has been waking up with no voice in the morning, cough up a lot of phlegm. His voices come back when he drinks something hot for a little while but goes out again about dust. He is requesting something be called in he states has had this in the past and he have called in something but couldn't remember what it was. [employer]

## 2022-10-03 NOTE — Telephone Encounter (Signed)
See my chart message from patient - provider recommendations sent to patient via my chart.

## 2022-10-13 ENCOUNTER — Other Ambulatory Visit: Payer: Self-pay | Admitting: Family Medicine

## 2022-10-24 ENCOUNTER — Emergency Department (HOSPITAL_COMMUNITY)
Admission: EM | Admit: 2022-10-24 | Discharge: 2022-10-24 | Disposition: A | Discharge status: Home / Self Care | Payer: 59 | Attending: Emergency Medicine | Admitting: Emergency Medicine

## 2022-10-24 ENCOUNTER — Emergency Department (HOSPITAL_COMMUNITY): Payer: 59

## 2022-10-24 ENCOUNTER — Other Ambulatory Visit: Payer: Self-pay

## 2022-10-24 DIAGNOSIS — R1084 Generalized abdominal pain: Secondary | ICD-10-CM | POA: Diagnosis not present

## 2022-10-24 DIAGNOSIS — R030 Elevated blood-pressure reading, without diagnosis of hypertension: Secondary | ICD-10-CM | POA: Diagnosis not present

## 2022-10-24 DIAGNOSIS — K2980 Duodenitis without bleeding: Secondary | ICD-10-CM | POA: Insufficient documentation

## 2022-10-24 DIAGNOSIS — R109 Unspecified abdominal pain: Secondary | ICD-10-CM | POA: Diagnosis not present

## 2022-10-24 DIAGNOSIS — I700 Atherosclerosis of aorta: Secondary | ICD-10-CM | POA: Diagnosis not present

## 2022-10-24 DIAGNOSIS — I10 Essential (primary) hypertension: Secondary | ICD-10-CM | POA: Diagnosis not present

## 2022-10-24 LAB — CBC
HCT: 50 % (ref 39.0–52.0)
Hemoglobin: 17.5 g/dL — ABNORMAL HIGH (ref 13.0–17.0)
MCH: 30.1 pg (ref 26.0–34.0)
MCHC: 35 g/dL (ref 30.0–36.0)
MCV: 86.1 fL (ref 80.0–100.0)
Platelets: 273 10*3/uL (ref 150–400)
RBC: 5.81 MIL/uL (ref 4.22–5.81)
RDW: 14.9 % (ref 11.5–15.5)
WBC: 12.3 10*3/uL — ABNORMAL HIGH (ref 4.0–10.5)
nRBC: 0 % (ref 0.0–0.2)

## 2022-10-24 LAB — COMPREHENSIVE METABOLIC PANEL
ALT: 33 U/L (ref 0–44)
AST: 21 U/L (ref 15–41)
Albumin: 3.5 g/dL (ref 3.5–5.0)
Alkaline Phosphatase: 65 U/L (ref 38–126)
Anion gap: 9 (ref 5–15)
BUN: 26 mg/dL — ABNORMAL HIGH (ref 6–20)
CO2: 23 mmol/L (ref 22–32)
Calcium: 8.5 mg/dL — ABNORMAL LOW (ref 8.9–10.3)
Chloride: 101 mmol/L (ref 98–111)
Creatinine, Ser: 0.79 mg/dL (ref 0.61–1.24)
GFR, Estimated: >60 mL/min (ref >60)
Glucose, Bld: 130 mg/dL — ABNORMAL HIGH (ref 70–99)
Potassium: 3.7 mmol/L (ref 3.5–5.1)
Sodium: 133 mmol/L — ABNORMAL LOW (ref 135–145)
Total Bilirubin: 0.6 mg/dL (ref 0.3–1.2)
Total Protein: 6.8 g/dL (ref 6.5–8.1)

## 2022-10-24 LAB — URINALYSIS, ROUTINE W REFLEX MICROSCOPIC
Bilirubin Urine: NEGATIVE
Glucose, UA: NEGATIVE mg/dL
Hgb urine dipstick: NEGATIVE
Ketones, ur: NEGATIVE mg/dL
Leukocytes,Ua: NEGATIVE
Nitrite: NEGATIVE
Protein, ur: 30 mg/dL — AB
Specific Gravity, Urine: 1.029 (ref 1.005–1.030)
pH: 6 (ref 5.0–8.0)

## 2022-10-24 LAB — LIPASE, BLOOD: Lipase: 28 U/L (ref 11–51)

## 2022-10-24 MED ORDER — HYDROMORPHONE HCL 1 MG/ML IJ SOLN
0.5000 mg | Freq: Once | INTRAMUSCULAR | Status: AC
  Administered 2022-10-24: 0.5 mg via INTRAVENOUS
  Filled 2022-10-24: qty 0.5

## 2022-10-24 MED ORDER — LACTATED RINGERS IV BOLUS
1000.0000 mL | Freq: Once | INTRAVENOUS | Status: AC
  Administered 2022-10-24: 1000 mL via INTRAVENOUS

## 2022-10-24 MED ORDER — ONDANSETRON HCL 4 MG/2ML IJ SOLN
4.0000 mg | Freq: Once | INTRAMUSCULAR | Status: AC
  Administered 2022-10-24: 4 mg via INTRAVENOUS
  Filled 2022-10-24: qty 2

## 2022-10-24 MED ORDER — METRONIDAZOLE 500 MG PO TABS: 500.0000 mg | ORAL_TABLET | Freq: Two times a day (BID) | ORAL | 0 refills | Status: DC

## 2022-10-24 MED ORDER — PANTOPRAZOLE SODIUM 40 MG IV SOLR
40.0000 mg | Freq: Once | INTRAVENOUS | Status: AC
  Administered 2022-10-24: 40 mg via INTRAVENOUS
  Filled 2022-10-24: qty 10

## 2022-10-24 MED ORDER — PANTOPRAZOLE SODIUM 40 MG PO TBEC: 40.0000 mg | DELAYED_RELEASE_TABLET | Freq: Two times a day (BID) | ORAL | 0 refills | Status: DC

## 2022-10-24 MED ORDER — CLARITHROMYCIN 500 MG PO TABS: 500.0000 mg | ORAL_TABLET | Freq: Two times a day (BID) | ORAL | 0 refills | Status: DC

## 2022-10-24 MED ORDER — TRAMADOL HCL 50 MG PO TABS: 50.0000 mg | ORAL_TABLET | Freq: Four times a day (QID) | ORAL | 0 refills | Status: DC | PRN

## 2022-10-24 MED ORDER — CLARITHROMYCIN 500 MG PO TABS
500.0000 mg | ORAL_TABLET | Freq: Once | ORAL | Status: AC
  Administered 2022-10-24: 500 mg via ORAL
  Filled 2022-10-24: qty 1

## 2022-10-24 MED ORDER — METRONIDAZOLE 500 MG PO TABS
500.0000 mg | ORAL_TABLET | Freq: Once | ORAL | Status: AC
  Administered 2022-10-24: 500 mg via ORAL
  Filled 2022-10-24: qty 1

## 2022-10-24 MED ORDER — IOHEXOL 300 MG/ML  SOLN
100.0000 mL | Freq: Once | INTRAMUSCULAR | Status: AC | PRN
  Administered 2022-10-24: 100 mL via INTRAVENOUS

## 2022-10-24 NOTE — ED Provider Notes (Signed)
[place] EMERGENCY DEPARTMENT AT [facility] Provider Note   CSN: [id number] Arrival date & time: 10/24/22  1546     History  Chief Complaint  Patient presents with   Abdominal Pain   Emesis    Nicolas [name] is a 47 y.o. male.  Pt c/o abd pain in the past four days. Symptoms acute onset, moderate, persistent, non radiating. Nausea. Multiple episodes diarrhea, not bloody. No recent antibiotic use or travel. Family members with recent nvd symptoms, but their symptoms improved. Pt denies dysuria or gu c/o. No back/flank pain. No chest pain or sob. No fever or chills. Did get first dose of skirizi for eczema last week.   The history is provided by the patient, medical records and the spouse.  Abdominal Pain Associated symptoms: diarrhea, nausea and vomiting   Associated symptoms: no chest pain, no chills, no cough, no dysuria, no fever, no shortness of breath and no sore throat   Emesis Associated symptoms: abdominal pain and diarrhea   Associated symptoms: no chills, no cough, no fever, no headaches and no sore throat        Home Medications Prior to Admission medications   Medication Sig Start Date End Date Taking? Authorizing Provider  clarithromycin (BIAXIN) 500 MG tablet Take 1 tablet (500 mg total) by mouth 2 (two) times daily. 10/24/22  Yes Cathren Laine, MD  metroNIDAZOLE (FLAGYL) 500 MG tablet Take 1 tablet (500 mg total) by mouth 2 (two) times daily. 10/24/22  Yes Cathren Laine, MD  pantoprazole (PROTONIX) 40 MG tablet Take 1 tablet (40 mg total) by mouth 2 (two) times daily. 10/24/22  Yes Cathren Laine, MD  traMADol (ULTRAM) 50 MG tablet Take 1 tablet (50 mg total) by mouth every 6 (six) hours as needed. 10/24/22  Yes Cathren Laine, MD  albuterol (VENTOLIN HFA) 108 (90 Base) MCG/ACT inhaler Inhale 2 puffs into the lungs every 4 (four) hours as needed for wheezing or shortness of breath. 07/06/22   Particia Nearing, PA-C  amLODipine (NORVASC) 5 MG tablet  TAKE 1 TABLET BY MOUTH ONCE DAILY. 09/01/22   [name], MD  aspirin EC 81 MG tablet Take 1 tablet (81 mg total) by mouth daily. Swallow whole. 04/11/22   Furth, Cadence H, PA-C  clobetasol ointment (TEMOVATE) 0.05 % APPLY TO AFFECTED AREAS TWICE DAILY AS NEEDED. DO NOT APPLY TO FACE. 10/16/22   [name], MD  rosuvastatin (CRESTOR) 20 MG tablet Take 1 tablet (20 mg total) by mouth daily. 04/11/22 04/06/23  Furth, Cadence H, PA-C      Allergies    Patient has no known allergies.    Review of Systems   Review of Systems  Constitutional:  Negative for chills and fever.  HENT:  Negative for sore throat.   Eyes:  Negative for redness.  Respiratory:  Negative for cough and shortness of breath.   Cardiovascular:  Negative for chest pain.  Gastrointestinal:  Positive for abdominal pain, diarrhea, nausea and vomiting.  Genitourinary:  Negative for dysuria and flank pain.  Musculoskeletal:  Negative for back pain and neck pain.  Skin:  Negative for rash.  Neurological:  Negative for headaches.  Hematological:  Does not bruise/bleed easily.  Psychiatric/Behavioral:  Negative for confusion.     Physical Exam Updated Vital Signs BP (!) 156/92   Pulse 86   Temp 98.1 F (36.7 C) (Oral)   Resp 19   SpO2 99%  Physical Exam Vitals and nursing note reviewed.  Constitutional:  Appearance: Normal appearance. He is well-developed.  HENT:     Head: Atraumatic.     Nose: Nose normal.     Mouth/Throat:     Mouth: Mucous membranes are moist.     Pharynx: Oropharynx is clear.  Eyes:     General: No scleral icterus.    Conjunctiva/sclera: Conjunctivae normal.  Neck:     Trachea: No tracheal deviation.  Cardiovascular:     Rate and Rhythm: Normal rate and regular rhythm.     Pulses: Normal pulses.     Heart sounds: Normal heart sounds. No murmur heard.    No friction rub. No gallop.  Pulmonary:     Effort: Pulmonary effort is normal. No accessory muscle usage or respiratory  distress.     Breath sounds: Normal breath sounds.  Abdominal:     General: Bowel sounds are normal. There is no distension.     Palpations: Abdomen is soft.     Tenderness: There is abdominal tenderness. There is no guarding.     Comments: Mid abd tenderness.   Genitourinary:    Comments: No cva tenderness. Musculoskeletal:        General: No swelling or tenderness.     Cervical back: Normal range of motion and neck supple. No rigidity.     Right lower leg: No edema.     Left lower leg: No edema.  Skin:    General: Skin is warm and dry.     Findings: No rash.  Neurological:     Mental Status: He is alert.     Comments: Alert, speech clear.   Psychiatric:        Mood and Affect: Mood normal.     ED Results / Procedures / Treatments   Labs (all labs ordered are listed, but only abnormal results are displayed) Results for orders placed or performed during the hospital encounter of 10/24/22  Lipase, blood  Result Value Ref Range   Lipase 28 11 - 51 U/L  Comprehensive metabolic panel  Result Value Ref Range   Sodium 133 (L) 135 - 145 mmol/L   Potassium 3.7 3.5 - 5.1 mmol/L   Chloride 101 98 - 111 mmol/L   CO2 23 22 - 32 mmol/L   Glucose, Bld 130 (H) 70 - 99 mg/dL   BUN 26 (H) 6 - 20 mg/dL   Creatinine, Ser 1.61 0.61 - 1.24 mg/dL   Calcium 8.5 (L) 8.9 - 10.3 mg/dL   Total Protein 6.8 6.5 - 8.1 g/dL   Albumin 3.5 3.5 - 5.0 g/dL   AST 21 15 - 41 U/L   ALT 33 0 - 44 U/L   Alkaline Phosphatase 65 38 - 126 U/L   Total Bilirubin 0.6 0.3 - 1.2 mg/dL   GFR, Estimated >09 >60 mL/min   Anion gap 9 5 - 15  CBC  Result Value Ref Range   WBC 12.3 (H) 4.0 - 10.5 K/uL   RBC 5.81 4.22 - 5.81 MIL/uL   Hemoglobin 17.5 (H) 13.0 - 17.0 g/dL   HCT 45.4 09.8 - 11.9 %   MCV 86.1 80.0 - 100.0 fL   MCH 30.1 26.0 - 34.0 pg   MCHC 35.0 30.0 - 36.0 g/dL   RDW 14.7 82.9 - 56.2 %   Platelets 273 150 - 400 K/uL   nRBC 0.0 0.0 - 0.2 %  Urinalysis, Routine w reflex microscopic -Urine, Clean  Catch  Result Value Ref Range   Color, Urine YELLOW YELLOW   APPearance  CLEAR CLEAR   Specific Gravity, Urine 1.029 1.005 - 1.030   pH 6.0 5.0 - 8.0   Glucose, UA NEGATIVE NEGATIVE mg/dL   Hgb urine dipstick NEGATIVE NEGATIVE   Bilirubin Urine NEGATIVE NEGATIVE   Ketones, ur NEGATIVE NEGATIVE mg/dL   Protein, ur 30 (A) NEGATIVE mg/dL   Nitrite NEGATIVE NEGATIVE   Leukocytes,Ua NEGATIVE NEGATIVE   RBC / HPF 0-5 0 - 5 RBC/hpf   WBC, UA 0-5 0 - 5 WBC/hpf   Bacteria, UA RARE (A) NONE SEEN   Squamous Epithelial / HPF 0-5 0 - 5 /HPF   Mucus PRESENT     EKG None  Radiology CT ABDOMEN PELVIS W CONTRAST  Result Date: 10/24/2022 CLINICAL DATA:  Abdominal pain since Saturday EXAM: CT ABDOMEN AND PELVIS WITH CONTRAST TECHNIQUE: Multidetector CT imaging of the abdomen and pelvis was performed using the standard protocol following bolus administration of intravenous contrast. RADIATION DOSE REDUCTION: This exam was performed according to the departmental dose-optimization program which includes automated exposure control, adjustment of the mA and/or kV according to patient size and/or use of iterative reconstruction technique. CONTRAST:  OMNIPAQUE IOHEXOL 300 MG/ML  SOLN COMPARISON:  04/05/2022 and previous FINDINGS: Lower chest: Trace pericardial effusion. No pleural effusion. Visualized lung bases clear. Hepatobiliary: No focal liver abnormality is seen. No gallstones, gallbladder wall thickening, or biliary dilatation. Pancreas: Unremarkable. No pancreatic ductal dilatation or surrounding inflammatory changes. Spleen: Normal in size without focal abnormality. Adrenals/Urinary Tract: Coarse bilateral adrenal calcifications present since scans dating back to 01/01/2002. Benign 1.9 cm mid left renal cyst present since 02/09/2014 ; no follow-up warranted. No urolithiasis or hydronephrosis. Urinary bladder nondistended. Stomach/Bowel: Stomach is incompletely distended, unremarkable. Pylorus normal.  Duodenal bulb distends normally. There is new circumferential wall thickening through the mid and distal portions of the duodenum, with mild distension, and moderate regional retroperitoneal inflammatory/edematous change. No evidence of perforation or abscess. The jejunum and ileum are nondistended, unremarkable. Normal appendix. Colon is incompletely distended by gas and fecal material, unremarkable. Vascular/Lymphatic: Minimal scattered aortoiliac calcified atheromatous plaque without aneurysm. Portal vein patent. No abdominal or pelvic adenopathy. Reproductive: Mild prostate enlargement with scattered coarse calcifications. Other: Bilateral pelvic phleboliths.  No ascites.  No free air. Musculoskeletal: Mild T12 vertebral compression deformity stable since prior study. IMPRESSION: 1. New circumferential wall thickening through the mid and distal portions of the duodenum, with moderate regional retroperitoneal inflammatory/edematous change, suggesting duodenitis. No evidence of perforation or abscess. 2.  Aortic Atherosclerosis (ICD10-I70.0). Electronically Signed   By: Corlis Leak M.D.   On: 10/24/2022 19:48    Procedures Procedures    Medications Ordered in ED Medications  lactated ringers bolus 1,000 mL (1,000 mLs Intravenous New Bag/Given 10/24/22 1854)  HYDROmorphone (DILAUDID) injection 0.5 mg (0.5 mg Intravenous Given 10/24/22 1857)  ondansetron (ZOFRAN) injection 4 mg (4 mg Intravenous Given 10/24/22 1856)  iohexol (OMNIPAQUE) 300 MG/ML solution 100 mL (100 mLs Intravenous Contrast Given 10/24/22 1920)  clarithromycin (BIAXIN) tablet 500 mg (500 mg Oral Given 10/24/22 2111)  pantoprazole (PROTONIX) injection 40 mg (40 mg Intravenous Given 10/24/22 2034)  metroNIDAZOLE (FLAGYL) tablet 500 mg (500 mg Oral Given 10/24/22 2034)  HYDROmorphone (DILAUDID) injection 0.5 mg (0.5 mg Intravenous Given 10/24/22 2057)    ED Course/ Medical Decision Making/ A&P                             Medical Decision  Making Problems Addressed: Abdominal pain, generalized: acute  illness or injury with systemic symptoms that poses a threat to life or bodily functions Duodenitis: acute illness or injury with systemic symptoms that poses a threat to life or bodily functions Elevated blood pressure reading: acute illness or injury  Amount and/or Complexity of Data Reviewed Independent Historian:     Details: So/ hx External Data Reviewed: notes. Labs: ordered. Decision-making details documented in ED Course. Radiology: ordered and independent interpretation performed. Decision-making details documented in ED Course.  Risk Prescription drug management. Parenteral controlled substances. Decision regarding hospitalization.  Iv ns. Continuous pulse ox and cardiac monitoring. Labs ordered/sent. Imaging ordered.   Differential diagnosis includes dehydration, aki, GE, etc. Dispo decision including potential need for admission considered - will get labs and imaging and reassess.   Reviewed nursing notes and prior charts for additional history. External reports reviewed. Additional history from: family.   LR bolus. Dilaudid iv. Zofran iv.   Cardiac monitor: sinus rhythm, rate 94.  Labs reviewed/interpreted by me - wbc mildly high.   CT reviewed/interpreted by me - duodenitis.  H pylori test sent.   Protonix iv, biaxin po, flagyl po. Rx for home.  Recheck pain controlle.d no nv. Vital stable.   Rec close pcp/gi  f/u.  Return precautions provided.          Final Clinical Impression(s) / ED Diagnoses Final diagnoses:  Abdominal pain, generalized  Duodenitis  Elevated blood pressure reading    Rx / DC Orders ED Discharge Orders          Ordered    metroNIDAZOLE (FLAGYL) 500 MG tablet  2 times daily        10/24/22 2119    pantoprazole (PROTONIX) 40 MG tablet  2 times daily        10/24/22 2119    traMADol (ULTRAM) 50 MG tablet  Every 6 hours PRN        10/24/22 2119    clarithromycin  (BIAXIN) 500 MG tablet  2 times daily        10/24/22 2119              Cathren Laine, MD 10/24/22 2122

## 2022-10-24 NOTE — ED Notes (Signed)
AC called to bring clarithromycin.

## 2022-10-24 NOTE — ED Notes (Signed)
Patient transported to CT 

## 2022-10-24 NOTE — ED Triage Notes (Signed)
Pt with n/v/d since Saturday. Other family members had the same but improved.  Pt has not been able to eat but has been holding down liquids.

## 2022-10-24 NOTE — Discharge Instructions (Addendum)
It was our pleasure to provide your ER care today - we hope that you feel better.  Your scan was read as showing: 1. New circumferential wall thickening through the mid and distal portions of the duodenum, with moderate regional retroperitoneal inflammatory/edematous change, suggesting duodenitis.   Drink plenty of fluids/stay well hydrated. Take protonix (acid blocker medication) as prescribed. Take antibiotics (biaxin and flagyl) as prescribed.  Take acetaminophen as need for pain.  You may also take ultram as need for pain - no driving when taking. Follow up closely with primary care doctor/gi specialist in the next 1-2 weeks - call office to arrange appointment. Also follow up closely with your doctor regarding your blood pressure that is high tonight.   Return to ER if worse, new symptoms, fevers, new or worsening or severe abdominal pain, persistent vomiting, or other concern.   You were given pain meds in the ER - no driving for the next 6 hours.

## 2022-10-30 ENCOUNTER — Telehealth: Payer: Self-pay

## 2022-10-30 ENCOUNTER — Other Ambulatory Visit: Payer: Self-pay

## 2022-10-30 ENCOUNTER — Inpatient Hospital Stay (HOSPITAL_COMMUNITY)
Admission: EM | Admit: 2022-10-30 | Discharge: 2022-11-02 | DRG: 378 | Disposition: A | Discharge status: Home / Self Care | Payer: 59 | Attending: Family Medicine | Admitting: Family Medicine

## 2022-10-30 ENCOUNTER — Emergency Department (HOSPITAL_COMMUNITY): Payer: 59

## 2022-10-30 ENCOUNTER — Encounter (HOSPITAL_COMMUNITY): Payer: Self-pay | Admitting: *Deleted

## 2022-10-30 DIAGNOSIS — K311 Adult hypertrophic pyloric stenosis: Secondary | ICD-10-CM | POA: Diagnosis present

## 2022-10-30 DIAGNOSIS — D122 Benign neoplasm of ascending colon: Secondary | ICD-10-CM | POA: Diagnosis present

## 2022-10-30 DIAGNOSIS — K3189 Other diseases of stomach and duodenum: Secondary | ICD-10-CM | POA: Diagnosis present

## 2022-10-30 DIAGNOSIS — Z7982 Long term (current) use of aspirin: Secondary | ICD-10-CM

## 2022-10-30 DIAGNOSIS — R7303 Prediabetes: Secondary | ICD-10-CM

## 2022-10-30 DIAGNOSIS — I6523 Occlusion and stenosis of bilateral carotid arteries: Secondary | ICD-10-CM | POA: Diagnosis present

## 2022-10-30 DIAGNOSIS — D62 Acute posthemorrhagic anemia: Secondary | ICD-10-CM | POA: Diagnosis not present

## 2022-10-30 DIAGNOSIS — K264 Chronic or unspecified duodenal ulcer with hemorrhage: Secondary | ICD-10-CM | POA: Diagnosis present

## 2022-10-30 DIAGNOSIS — Z87891 Personal history of nicotine dependence: Secondary | ICD-10-CM

## 2022-10-30 DIAGNOSIS — E669 Obesity, unspecified: Secondary | ICD-10-CM | POA: Diagnosis present

## 2022-10-30 DIAGNOSIS — L400 Psoriasis vulgaris: Secondary | ICD-10-CM | POA: Diagnosis present

## 2022-10-30 DIAGNOSIS — E119 Type 2 diabetes mellitus without complications: Secondary | ICD-10-CM

## 2022-10-30 DIAGNOSIS — Z79899 Other long term (current) drug therapy: Secondary | ICD-10-CM

## 2022-10-30 DIAGNOSIS — K922 Gastrointestinal hemorrhage, unspecified: Secondary | ICD-10-CM | POA: Diagnosis not present

## 2022-10-30 DIAGNOSIS — K625 Hemorrhage of anus and rectum: Secondary | ICD-10-CM

## 2022-10-30 DIAGNOSIS — E1165 Type 2 diabetes mellitus with hyperglycemia: Secondary | ICD-10-CM | POA: Diagnosis present

## 2022-10-30 DIAGNOSIS — K5731 Diverticulosis of large intestine without perforation or abscess with bleeding: Secondary | ICD-10-CM | POA: Diagnosis present

## 2022-10-30 DIAGNOSIS — I6529 Occlusion and stenosis of unspecified carotid artery: Secondary | ICD-10-CM | POA: Diagnosis present

## 2022-10-30 DIAGNOSIS — K648 Other hemorrhoids: Secondary | ICD-10-CM | POA: Diagnosis present

## 2022-10-30 DIAGNOSIS — K2981 Duodenitis with bleeding: Secondary | ICD-10-CM | POA: Diagnosis not present

## 2022-10-30 DIAGNOSIS — R197 Diarrhea, unspecified: Secondary | ICD-10-CM

## 2022-10-30 DIAGNOSIS — Z8711 Personal history of peptic ulcer disease: Secondary | ICD-10-CM

## 2022-10-30 DIAGNOSIS — F419 Anxiety disorder, unspecified: Secondary | ICD-10-CM | POA: Diagnosis present

## 2022-10-30 DIAGNOSIS — R7401 Elevation of levels of liver transaminase levels: Secondary | ICD-10-CM

## 2022-10-30 DIAGNOSIS — K219 Gastro-esophageal reflux disease without esophagitis: Secondary | ICD-10-CM | POA: Diagnosis present

## 2022-10-30 DIAGNOSIS — Z6835 Body mass index (BMI) 35.0-35.9, adult: Secondary | ICD-10-CM

## 2022-10-30 DIAGNOSIS — N281 Cyst of kidney, acquired: Secondary | ICD-10-CM | POA: Diagnosis not present

## 2022-10-30 DIAGNOSIS — I10 Essential (primary) hypertension: Secondary | ICD-10-CM | POA: Diagnosis present

## 2022-10-30 DIAGNOSIS — E785 Hyperlipidemia, unspecified: Secondary | ICD-10-CM | POA: Diagnosis present

## 2022-10-30 LAB — C-REACTIVE PROTEIN: CRP: <0.5 mg/dL (ref <1.0)

## 2022-10-30 LAB — COMPREHENSIVE METABOLIC PANEL
ALT: 77 U/L — ABNORMAL HIGH (ref 0–44)
AST: 39 U/L (ref 15–41)
Albumin: 3 g/dL — ABNORMAL LOW (ref 3.5–5.0)
Alkaline Phosphatase: 59 U/L (ref 38–126)
Anion gap: 6 (ref 5–15)
BUN: 33 mg/dL — ABNORMAL HIGH (ref 6–20)
CO2: 25 mmol/L (ref 22–32)
Calcium: 8 mg/dL — ABNORMAL LOW (ref 8.9–10.3)
Chloride: 105 mmol/L (ref 98–111)
Creatinine, Ser: 0.8 mg/dL (ref 0.61–1.24)
GFR, Estimated: >60 mL/min (ref >60)
Glucose, Bld: 178 mg/dL — ABNORMAL HIGH (ref 70–99)
Potassium: 4.5 mmol/L (ref 3.5–5.1)
Sodium: 136 mmol/L (ref 135–145)
Total Bilirubin: 0.4 mg/dL (ref 0.3–1.2)
Total Protein: 5.3 g/dL — ABNORMAL LOW (ref 6.5–8.1)

## 2022-10-30 LAB — HEMOGLOBIN AND HEMATOCRIT, BLOOD
HCT: 26.1 % — ABNORMAL LOW (ref 39.0–52.0)
Hemoglobin: 9 g/dL — ABNORMAL LOW (ref 13.0–17.0)

## 2022-10-30 LAB — CBC
HCT: 31.2 % — ABNORMAL LOW (ref 39.0–52.0)
HCT: 26.2 % — ABNORMAL LOW (ref 39.0–52.0)
HCT: 25.2 % — ABNORMAL LOW (ref 39.0–52.0)
Hemoglobin: 10.9 g/dL — ABNORMAL LOW (ref 13.0–17.0)
Hemoglobin: 9.1 g/dL — ABNORMAL LOW (ref 13.0–17.0)
Hemoglobin: 8.7 g/dL — ABNORMAL LOW (ref 13.0–17.0)
MCH: 30.4 pg (ref 26.0–34.0)
MCH: 30.1 pg (ref 26.0–34.0)
MCH: 30.2 pg (ref 26.0–34.0)
MCHC: 34.9 g/dL (ref 30.0–36.0)
MCHC: 34.7 g/dL (ref 30.0–36.0)
MCHC: 34.5 g/dL (ref 30.0–36.0)
MCV: 86.9 fL (ref 80.0–100.0)
MCV: 86.8 fL (ref 80.0–100.0)
MCV: 87.5 fL (ref 80.0–100.0)
Platelets: 354 10*3/uL (ref 150–400)
Platelets: 308 10*3/uL (ref 150–400)
Platelets: 317 10*3/uL (ref 150–400)
RBC: 3.59 MIL/uL — ABNORMAL LOW (ref 4.22–5.81)
RBC: 3.02 MIL/uL — ABNORMAL LOW (ref 4.22–5.81)
RBC: 2.88 MIL/uL — ABNORMAL LOW (ref 4.22–5.81)
RDW: 14.6 % (ref 11.5–15.5)
RDW: 14.9 % (ref 11.5–15.5)
RDW: 15.2 % (ref 11.5–15.5)
WBC: 11.4 10*3/uL — ABNORMAL HIGH (ref 4.0–10.5)
WBC: 11.4 10*3/uL — ABNORMAL HIGH (ref 4.0–10.5)
WBC: 10.3 10*3/uL (ref 4.0–10.5)
nRBC: 0 % (ref 0.0–0.2)
nRBC: 0 % (ref 0.0–0.2)
nRBC: 0 % (ref 0.0–0.2)

## 2022-10-30 LAB — ABO/RH: ABO/RH(D): O POS

## 2022-10-30 LAB — TYPE AND SCREEN: ABO/RH(D): O POS

## 2022-10-30 LAB — TSH: TSH: 2.746 u[IU]/mL (ref 0.350–4.500)

## 2022-10-30 LAB — SEDIMENTATION RATE: Sed Rate: 7 mm/hr (ref 0–16)

## 2022-10-30 LAB — HEMOGLOBIN A1C
Hgb A1c MFr Bld: 6.8 % — ABNORMAL HIGH (ref 4.8–5.6)
Mean Plasma Glucose: 148.46 mg/dL

## 2022-10-30 LAB — MRSA NEXT GEN BY PCR, NASAL: MRSA by PCR Next Gen: NOT DETECTED

## 2022-10-30 MED ORDER — ONDANSETRON HCL 4 MG PO TABS: 4.0000 mg | ORAL_TABLET | Freq: Four times a day (QID) | ORAL | Status: DC | PRN

## 2022-10-30 MED ORDER — ONDANSETRON HCL 4 MG/2ML IJ SOLN
4.0000 mg | Freq: Once | INTRAMUSCULAR | Status: AC
  Administered 2022-10-30: 4 mg via INTRAVENOUS
  Filled 2022-10-30: qty 2

## 2022-10-30 MED ORDER — ONDANSETRON HCL 4 MG/2ML IJ SOLN
4.0000 mg | Freq: Four times a day (QID) | INTRAMUSCULAR | Status: DC | PRN
  Administered 2022-10-30 – 2022-10-31 (×2): 4 mg via INTRAVENOUS
  Filled 2022-10-30 (×2): qty 2

## 2022-10-30 MED ORDER — PANTOPRAZOLE SODIUM 40 MG IV SOLR
40.0000 mg | Freq: Once | INTRAVENOUS | Status: AC
  Administered 2022-10-30: 40 mg via INTRAVENOUS
  Filled 2022-10-30: qty 10

## 2022-10-30 MED ORDER — METRONIDAZOLE 500 MG PO TABS: 500.0000 mg | ORAL_TABLET | Freq: Two times a day (BID) | ORAL | Status: DC

## 2022-10-30 MED ORDER — PEG 3350-KCL-NA BICARB-NACL 420 G PO SOLR
4000.0000 mL | Freq: Once | ORAL | Status: AC
  Administered 2022-10-30: 4000 mL via ORAL

## 2022-10-30 MED ORDER — DICYCLOMINE HCL 10 MG PO CAPS
20.0000 mg | ORAL_CAPSULE | Freq: Once | ORAL | Status: AC
  Administered 2022-10-30: 20 mg via ORAL
  Filled 2022-10-30: qty 2

## 2022-10-30 MED ORDER — ACETAMINOPHEN 650 MG RE SUPP: 650.0000 mg | Freq: Four times a day (QID) | RECTAL | Status: DC | PRN

## 2022-10-30 MED ORDER — IOHEXOL 350 MG/ML SOLN
100.0000 mL | Freq: Once | INTRAVENOUS | Status: AC | PRN
  Administered 2022-10-30: 100 mL via INTRAVENOUS

## 2022-10-30 MED ORDER — PANTOPRAZOLE SODIUM 40 MG IV SOLR
40.0000 mg | Freq: Two times a day (BID) | INTRAVENOUS | Status: DC
  Administered 2022-10-30 – 2022-11-02 (×6): 40 mg via INTRAVENOUS
  Filled 2022-10-30 (×6): qty 10

## 2022-10-30 MED ORDER — MORPHINE SULFATE (PF) 4 MG/ML IV SOLN
4.0000 mg | Freq: Once | INTRAVENOUS | Status: AC
  Administered 2022-10-30: 4 mg via INTRAVENOUS
  Filled 2022-10-30: qty 1

## 2022-10-30 MED ORDER — LACTATED RINGERS IV BOLUS
1000.0000 mL | Freq: Once | INTRAVENOUS | Status: AC
  Administered 2022-10-30: 1000 mL via INTRAVENOUS

## 2022-10-30 MED ORDER — CLARITHROMYCIN 500 MG PO TABS: 500.0000 mg | ORAL_TABLET | Freq: Two times a day (BID) | ORAL | Status: DC

## 2022-10-30 MED ORDER — MORPHINE SULFATE (PF) 2 MG/ML IV SOLN
2.0000 mg | INTRAVENOUS | Status: DC | PRN
  Administered 2022-10-30 – 2022-10-31 (×3): 2 mg via INTRAVENOUS
  Filled 2022-10-30 (×4): qty 1

## 2022-10-30 MED ORDER — CHLORHEXIDINE GLUCONATE CLOTH 2 % EX PADS
6.0000 | MEDICATED_PAD | Freq: Every day | CUTANEOUS | Status: DC
  Administered 2022-10-31: 6 via TOPICAL

## 2022-10-30 MED ORDER — ROSUVASTATIN CALCIUM 20 MG PO TABS
20.0000 mg | ORAL_TABLET | Freq: Every day | ORAL | Status: DC
  Administered 2022-10-31 – 2022-11-02 (×3): 20 mg via ORAL
  Filled 2022-10-30 (×3): qty 1

## 2022-10-30 MED ORDER — ACETAMINOPHEN 325 MG PO TABS
650.0000 mg | ORAL_TABLET | Freq: Four times a day (QID) | ORAL | Status: DC | PRN
  Administered 2022-11-01: 650 mg via ORAL
  Filled 2022-10-30 (×2): qty 2

## 2022-10-30 NOTE — Consult Note (Cosign Needed Addendum)
Gastroenterology Consult   Referring Provider: [facility] Primary Care Physician:  [name], MD Primary Gastroenterologist:  Dr. Jena Gauss,  last seen in 2015  Patient ID: Nicolas [name]; [id number]; 06/06/1976   Admit date: 10/30/2022  LOS: 0 days   Date of Consultation: 10/30/2022  Reason for Consultation:  Rectal bleeding, acute blood loss anemia  History of Present Illness   Nicolas [name] is a 47 y.o. year old male with history of CAD, PUD in 2015 without follow-up EGD for surveillance, HTN, migraines, kidney stones, presenting to the ED this morning after acute onset of abdominal cramping and bloody diarrhea. He was also seen in the ED on 5/14 with abdominal cramping and diarrhea that was non-blood. CT at that time with contrast circumferential wall thickening through mid and distal portions of duodenum, suggesting duodenitis. GI now consulted due to rectal bleeding with acute blood loss anemia.    In the ED: Hgb 10.9, down from 17 about a week ago. Baseline appears 14 range. Recheck this afternoon down to 9. BUN 33, creatinine 0.80. Isolated elevation of ALT at 77. CTA today: small focus of hyperenhancement along right wall of anus, likely representing internal hemorrhoid. No signs of active GI bleed.    Patient notes that last week he was seen in the ED due to 4 days of diarrhea. CT at that time with possible duodenitis. He was doing well when discharged from the ED. He ate chicken from [employer] last night. Started having abdominal cramps both epigastric and lower abdomen but predominantly located epigastric. He had multiple episodes of bloody stool. Last episode of bleeding around 0800. Abdomen crampy. No N/V. History of GERD but takes Pepcid prn. Was prescribed Protonix by ED when seen last week. 81 mg aspirin daily otherwise no NSAIDs.   No prior colonoscopy. EGD last in 2015 as noted below. No FH colon cancer, colon polyps, IBD.       EGD 2015 by Dr. Jena Gauss  : Erosive reflux esophagitis. Large area of prepyloric/antral ulceration. Status post biopsy. This inflammatory process likely extends to the area of the pancreatic head producing "bystander" pancreatitis. Negative H.pylori.   Past Medical History:  Diagnosis Date   Anxiety    Carotid artery disease (HCC)    History of stomach ulcers    Hypertension    Hypertriglyceridemia    Kidney stone    bil , different times  passed stones   Migraines    Obesity (BMI 30-39.9)    Pancreatitis    Stress    Syncope and collapse     Past Surgical History:  Procedure Laterality Date   BIOPSY N/A 02/11/2014   Procedure: BIOPSY;  Surgeon: Corbin Ade, MD;  Location: [facility];  Service: Endoscopy;  Laterality: N/A;   CARDIAC CATHETERIZATION  Aug 2015   normal EF, normal coronaries   CARDIAC CATHETERIZATION     CARPAL TUNNEL RELEASE Right 08/03/2021   Procedure: Right CARPAL TUNNEL RELEASE;  Surgeon: Marlyne Beards, MD;  Location: [facility];  Service: Orthopedics;  Laterality: Right;   CARPAL TUNNEL RELEASE Left 11/09/2021   Procedure: LEFT CARPAL TUNNEL RELEASE;  Surgeon: Marlyne Beards, MD;  Location: [facility];  Service: Orthopedics;  Laterality: Left;   ESOPHAGOGASTRODUODENOSCOPY (EGD) WITH PROPOFOL N/A 02/11/2014   Procedure: ESOPHAGOGASTRODUODENOSCOPY (EGD) WITH PROPOFOL;  Surgeon: Corbin Ade, MD;  Location: [facility];  Service: Endoscopy;  Laterality: N/A;   LEFT HEART CATHETERIZATION WITH CORONARY ANGIOGRAM N/A 01/18/2014  Procedure: LEFT HEART CATHETERIZATION WITH CORONARY ANGIOGRAM;  Surgeon: Lennette Bihari, MD;  Location: [facility] C[facility];  Service: Cardiovascular;  Laterality: N/A;    Prior to Admission medications   Medication Sig Start Date End Date Taking? Authorizing Provider  albuterol (VENTOLIN HFA) 108 (90 Base) MCG/ACT inhaler Inhale 2 puffs into the lungs every 4 (four) hours as needed for wheezing or shortness of breath. 07/06/22  Yes Particia Nearing, PA-C  amLODipine (NORVASC) 5 MG tablet TAKE 1 TABLET BY MOUTH ONCE DAILY. Patient taking differently: Take 5 mg by mouth daily. 09/01/22  Yes [name], MD  aspirin EC 81 MG tablet Take 1 tablet (81 mg total) by mouth daily. Swallow whole. 04/11/22  Yes Furth, Cadence H, PA-C  clarithromycin (BIAXIN) 500 MG tablet Take 1 tablet (500 mg total) by mouth 2 (two) times daily. 10/24/22  Yes Cathren Laine, MD  clobetasol ointment (TEMOVATE) 0.05 % APPLY TO AFFECTED AREAS TWICE DAILY AS NEEDED. DO NOT APPLY TO FACE. Patient taking differently: Apply 1 Application topically 2 (two) times daily. 10/16/22  Yes [name], MD  famotidine (PEPCID) 20 MG tablet Take 20 mg by mouth 2 (two) times daily. 09/09/22  Yes [provider]  metroNIDAZOLE (FLAGYL) 500 MG tablet Take 1 tablet (500 mg total) by mouth 2 (two) times daily. 10/24/22  Yes Cathren Laine, MD  pantoprazole (PROTONIX) 40 MG tablet Take 1 tablet (40 mg total) by mouth 2 (two) times daily. 10/24/22  Yes Cathren Laine, MD  rosuvastatin (CRESTOR) 20 MG tablet Take 1 tablet (20 mg total) by mouth daily. 04/11/22 04/06/23 Yes Furth, Cadence H, PA-C  SKYRIZI PEN 150 MG/ML SOAJ Inject 150 mg into the skin See admin instructions.   Yes [provider]  traMADol (ULTRAM) 50 MG tablet Take 1 tablet (50 mg total) by mouth every 6 (six) hours as needed. Patient taking differently: Take 50 mg by mouth every 6 (six) hours as needed for moderate pain. 10/24/22  Yes Cathren Laine, MD    Current Facility-Administered Medications  Medication Dose Route Frequency Provider Last Rate Last Admin   pantoprazole (PROTONIX) injection 40 mg  40 mg Intravenous Once Kommor, Madison, MD       Current Outpatient Medications  Medication Sig Dispense Refill   albuterol (VENTOLIN HFA) 108 (90 Base) MCG/ACT inhaler Inhale 2 puffs into the lungs every 4 (four) hours as needed for wheezing or shortness of breath. 18 g 0   amLODipine (NORVASC) 5 MG  tablet TAKE 1 TABLET BY MOUTH ONCE DAILY. (Patient taking differently: Take 5 mg by mouth daily.) 30 tablet 1   aspirin EC 81 MG tablet Take 1 tablet (81 mg total) by mouth daily. Swallow whole. 90 tablet 3   clarithromycin (BIAXIN) 500 MG tablet Take 1 tablet (500 mg total) by mouth 2 (two) times daily. 28 tablet 0   clobetasol ointment (TEMOVATE) 0.05 % APPLY TO AFFECTED AREAS TWICE DAILY AS NEEDED. DO NOT APPLY TO FACE. (Patient taking differently: Apply 1 Application topically 2 (two) times daily.) 45 g 0   famotidine (PEPCID) 20 MG tablet Take 20 mg by mouth 2 (two) times daily.     metroNIDAZOLE (FLAGYL) 500 MG tablet Take 1 tablet (500 mg total) by mouth 2 (two) times daily. 28 tablet 0   pantoprazole (PROTONIX) 40 MG tablet Take 1 tablet (40 mg total) by mouth 2 (two) times daily. 30 tablet 0   rosuvastatin (CRESTOR) 20 MG tablet Take 1 tablet (20 mg  total) by mouth daily. 90 tablet 3   SKYRIZI PEN 150 MG/ML SOAJ Inject 150 mg into the skin See admin instructions.     traMADol (ULTRAM) 50 MG tablet Take 1 tablet (50 mg total) by mouth every 6 (six) hours as needed. (Patient taking differently: Take 50 mg by mouth every 6 (six) hours as needed for moderate pain.) 20 tablet 0    Allergies as of 10/30/2022   (No Known Allergies)    Family History  Problem Relation Age of Onset   Cancer Mother        Breast   Colon cancer Neg Hx   Neg Colon polyps, negative IBD family history.   Social History   Socioeconomic History   Marital status: Married    Spouse name: Not on file   Number of children: Not on file   Years of education: Not on file   Highest education level: Not on file  Occupational History   Not on file  Tobacco Use   Smoking status: Former    Packs/day: 2.00    Years: 10.00    Additional pack years: 0.00    Total pack years: 20.00    Types: Cigarettes    Quit date: 12/05/2020    Years since quitting: 1.9   Smokeless tobacco: Never  Vaping Use   Vaping Use: Never  used  Substance and Sexual Activity   Alcohol use: No   Drug use: No   Sexual activity: Yes    Birth control/protection: None  Other Topics Concern   Not on file  Social History Narrative   Not on file   Social Determinants of Health   Financial Resource Strain: Not on file  Food Insecurity: No Food Insecurity (04/06/2022)   Hunger Vital Sign    Worried About Running Out of Food in the Last Year: Never true    Ran Out of Food in the Last Year: Never true  Transportation Needs: No Transportation Needs (04/06/2022)   PRAPARE - Administrator, Civil Service (Medical): No    Lack of Transportation (Non-Medical): No  Physical Activity: Not on file  Stress: Not on file  Social Connections: Not on file  Intimate Partner Violence: Not At Risk (04/06/2022)   Humiliation, Afraid, Rape, and Kick questionnaire    Fear of Current or Ex-Partner: No    Emotionally Abused: No    Physically Abused: No    Sexually Abused: No     Review of Systems   Gen: Denies any fever, chills, loss of appetite, change in weight or weight loss CV: Denies chest pain, heart palpitations, syncope, edema  Resp: Denies shortness of breath with rest, cough, wheezing, coughing up blood, and pleurisy. GI: see HPI GU : Denies urinary burning, blood in urine, urinary frequency, and urinary incontinence. MS: Denies joint pain, limitation of movement, swelling, cramps, and atrophy.  Derm: Denies rash, itching, dry skin, hives. Psych: Denies depression, anxiety, memory loss, hallucinations, and confusion. Heme: Denies bruising or bleeding Neuro:  Denies any headaches, dizziness, paresthesias, shaking  Physical Exam   Vital Signs in last 24 hours: Temp:  [98.9 F (37.2 C)-99.1 F (37.3 C)] 98.9 F (37.2 C) (05/20 1512) Pulse Rate:  [83-101] 83 (05/20 1500) Resp:  [10-20] 17 (05/20 1500) BP: (106-137)/(64-83) 115/64 (05/20 1500) SpO2:  [99 %-100 %] 99 % (05/20 1500)    General:   Alert,   Well-developed, well-nourished, pleasant and cooperative in NAD Head:  Normocephalic and atraumatic. Eyes:  Sclera  clear, no icterus.   Ears:  Normal auditory acuity. Mouth:  No deformity or lesions, dentition normal. Lungs:  Clear throughout to auscultation.    Heart:  S1 S2 present without murmurs Abdomen:  Soft, mildly TTP epigastric and lower abdomen and nondistended. No masses, hepatosplenomegaly or hernias noted. Normal bowel sounds, without guarding, and without rebound.   Rectal: deferred   Msk:  Symmetrical without gross deformities. Normal posture. Extremities:  Without edema. Neurologic:  Alert and  oriented x4. Skin:  Intact without significant lesions or rashes. Psych:  Alert and cooperative. Normal mood and affect.  Intake/Output from previous day: No intake/output data recorded. Intake/Output this shift: No intake/output data recorded.    Labs/Studies   Recent Labs Recent Labs    10/30/22 1058 10/30/22 1444  WBC 11.4*  --   HGB 10.9* 9.0*  HCT 31.2* 26.1*  PLT 354  --    BMET Recent Labs    10/30/22 1058  NA 136  K 4.5  CL 105  CO2 25  GLUCOSE 178*  BUN 33*  CREATININE 0.80  CALCIUM 8.0*   LFT Recent Labs    10/30/22 1058  PROT 5.3*  ALBUMIN 3.0*  AST 39  ALT 77*  ALKPHOS 59  BILITOT 0.4     Radiology/Studies CT ANGIO ABDOMEN PELVIS  W & WO CONTRAST  Result Date: 10/30/2022 CLINICAL DATA:  Bright red blood per rectum EXAM: CTA ABDOMEN AND PELVIS WITHOUT AND WITH CONTRAST TECHNIQUE: Multidetector CT imaging of the abdomen and pelvis was performed using the standard protocol during bolus administration of intravenous contrast. Multiplanar reconstructed images and MIPs were obtained and reviewed to evaluate the vascular anatomy. RADIATION DOSE REDUCTION: This exam was performed according to the departmental dose-optimization program which includes automated exposure control, adjustment of the mA and/or kV according to patient size and/or use  of iterative reconstruction technique. CONTRAST:  OMNIPAQUE IOHEXOL 350 MG/ML SOLN COMPARISON:  10/24/2022, 02/09/2014 FINDINGS: VASCULAR Aorta: Normal caliber aorta without aneurysm, dissection, vasculitis or significant stenosis. Mild calcified and noncalcified atherosclerotic plaque. Celiac: Patent without evidence of aneurysm, dissection, vasculitis or significant stenosis. SMA: Patent without evidence of aneurysm, dissection, vasculitis or significant stenosis. Common hepatic artery arises from the SMA, an anatomic variant. Renals: Both renal arteries are patent without evidence of aneurysm, dissection, vasculitis, fibromuscular dysplasia or significant stenosis. IMA: Patent. Inflow: Patent without evidence of aneurysm, dissection, vasculitis or significant stenosis. Proximal Outflow: Bilateral common femoral and visualized portions of the superficial and profunda femoral arteries are patent without evidence of aneurysm, dissection, vasculitis or significant stenosis. Veins: Major venous structures are patent by portal venous phase imaging. Review of the MIP images confirms the above findings. NON-VASCULAR Lower chest: Included lung bases are clear.  Heart size is normal. Hepatobiliary: No focal liver abnormality is seen. No gallstones, gallbladder wall thickening, or biliary dilatation. Pancreas: Unremarkable. No pancreatic ductal dilatation or surrounding inflammatory changes. Spleen: Normal in size without focal abnormality. Adrenals/Urinary Tract: Unchanged appearance of the adrenal glands with coarse calcifications present bilaterally. Simple left renal cyst which does not require follow-up imaging. Kidneys enhance symmetrically. No renal stone or hydronephrosis. Urinary bladder within normal limits. Stomach/Bowel: Stomach within normal limits. No dilated loops of bowel. Normal appendix in the right lower quadrant (series 15, image 71) no focal bowel wall thickening or inflammatory changes. Small  focus of hyperenhancement along the right wall of the anus (series 9, image 24), likely representing internal hemorrhoid. Elsewhere, no abnormal contrast accumulation within the bowel. Lymphatic: Mildly  enlarged lymph node in the porta hepatis measuring 1.4 cm in short axis (series 15, image 30), unchanged since at least 2015 and considered benign. No new or enlarging abdominopelvic lymph nodes. Reproductive: Prostate is unremarkable. Other: No free fluid. No abdominopelvic fluid collection. No pneumoperitoneum. No abdominal wall hernia. Musculoskeletal: No acute or significant osseous findings. IMPRESSION: 1. Small focus of hyperenhancement along the right wall of the anus, likely representing internal hemorrhoid. Elsewhere, no findings of active GI bleed. 2. No acute abdominopelvic findings. Negative for abdominal aortic aneurysm or dissection. Electronically Signed   By: Duanne Guess D.O.   On: 10/30/2022 12:30     Assessment   Nicolas [name] is a 47 y.o. year old male  with history of CAD, PUD in 2015 without follow-up EGD for surveillance, HTN, migraines, kidney stones, presenting to the ED this morning after acute onset of abdominal cramping and bloody stools. GI consulted due to rectal bleeding and acute blood loss anemia.   Rectal bleeding with acute blood loss anemia: Hgb on presentation 10.9 and down to 9 this afternoon from a baseline of around 14. Prior Hgb a week ago 17 but likely hemoconcentrated at that time. Symptom onset after eating [employer] chicken, starting with abdominal cramping and followed by multiple episodes of rectal bleeding. Differentials broad including diverticular, ischemic, less likely occult malignancy or IBD, unable to rule out rapid transit UGI bleed as he does have history of PUD in past and notes majority of discomfort in epigastric region of abdomen. CTA without active bleeding today. Recent CT last week with suggestion of duodenitis. He is hemodynamically  stable.Notably, diarrhea from last week had resolved after ED evaluation.   Will prep for colonoscopy on 5/21 with possible upper endoscopy if colonoscopy is negative. Continue with serial H/H.   As of note: patient recently had significant dental work with implants and wanted to mention this as we had discussed possible upper endoscopy.     Plan / Recommendations    Clear liquids NPO except sips after midnight Golytely 4 liters now Tap water enema in morning Follow H/H Transfuse as needed Continue PPI IV BID Colonoscopy with possible EGD on 5/21 by Dr. Levon Hedger. Discussed risks and benefits with stated understanding.      10/30/2022, 3:53 PM  Gelene Mink, PhD, ANP-BC [facility]

## 2022-10-30 NOTE — Assessment & Plan Note (Addendum)
Started on Skirizi this month and had his first injection on 10/19/22.  Rule out IBD as can see psoriasis with IBD

## 2022-10-30 NOTE — Assessment & Plan Note (Signed)
47 year old with acute onset of bright red blood per rectum this morning with associated generalized abdominal pain -obs to stepdown/progressive -hgb with large drop 14.6>17.5 (5/14)>10.9>9.0 -CTA abdo/pelvis with no findings of active bleed. Does have internal hemorrhoid -CT abdomen/pelvis on 5/14 suggestive of duodenitis and has been taking flagyl/clarithromycin x 7 days  -no hx of familial colon cancer/weight loss or anemia -will check inflammatory markers for IBD especially with known history of psoriasis +TSH -trend CBC q 6 hours and will transfuse if hgb <7 -type and screen -continue IV protonix  -clear liquid diet and NPO after midnight -GI consulted with plans for colonoscopy tomorrow

## 2022-10-30 NOTE — Assessment & Plan Note (Signed)
Continue IV Protonix 

## 2022-10-30 NOTE — Assessment & Plan Note (Signed)
Continue crestor 20mg daily  

## 2022-10-30 NOTE — Assessment & Plan Note (Signed)
Followed by cardiology -imaging 03/2022: 1. Mild (1-49%) stenosis proximal right internal carotid artery secondary to heterogenous atherosclerotic plaque. 2. Moderate (50-69%) stenosis proximal left internal carotid artery secondary to heterogenous atherosclerotic plaque. Hold ASA in setting of GI bleed Continue statin

## 2022-10-30 NOTE — Assessment & Plan Note (Addendum)
Well controlled and has not had medication today Hold norvasc for now

## 2022-10-30 NOTE — ED Provider Notes (Signed)
[place] EMERGENCY DEPARTMENT AT [facility] Provider Note  CSN: [id number] Arrival date & time: 10/30/22 4098  Chief Complaint(s) Rectal Bleeding  HPI Nicolas [name] is a 47 y.o. male with PMH nephrolithiasis, pancreatitis who presents emergency room for evaluation of rectal bleeding and abdominal pain.  Patient states that he was seen on 10/24/2022 and discharged with enteritis.  He states that his symptoms improved and he ate [employer] last night, leading to abrupt onset abdominal cramping and bloody diarrhea.  He has been unable to tolerate p.o. this morning secondary to worsening symptoms.  Denies chest pain, shortness of breath, headache, fever or other systemic symptoms.  Has had multiple episodes of bright red diarrhea.   Past Medical History Past Medical History:  Diagnosis Date   Anxiety    Carotid artery disease (HCC)    History of stomach ulcers    Hypertension    Hypertriglyceridemia    Kidney stone    bil , different times  passed stones   Migraines    Obesity (BMI 30-39.9)    Pancreatitis    Stress    Syncope and collapse    Patient Active Problem List   Diagnosis Date Noted   Stenosis of carotid artery 05/17/2022   Syncope 04/06/2022   Elevated troponin 04/06/2022   Hyperlipidemia 04/06/2022   GERD (gastroesophageal reflux disease) 04/06/2022   AKI (acute kidney injury) (HCC) 04/06/2022   Hyperglycemia 04/06/2022   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   Bilateral carpal tunnel syndrome 06/02/2021   Peptic ulcer disease 02/10/2014   Gastric ulcer 02/09/2014   Morbid obesity (HCC) 02/01/2014   Alcohol use 01/31/2014   Essential hypertension 10/24/2012   Home Medication(s) Prior to Admission medications   Medication Sig Start Date End Date Taking? Authorizing Provider  albuterol (VENTOLIN HFA) 108 (90 Base) MCG/ACT inhaler Inhale 2 puffs into the lungs every 4 (four) hours as needed for wheezing or shortness of breath. 07/06/22   Particia Nearing,  PA-C  amLODipine (NORVASC) 5 MG tablet TAKE 1 TABLET BY MOUTH ONCE DAILY. 09/01/22   [name], MD  aspirin EC 81 MG tablet Take 1 tablet (81 mg total) by mouth daily. Swallow whole. 04/11/22   Furth, Cadence H, PA-C  clarithromycin (BIAXIN) 500 MG tablet Take 1 tablet (500 mg total) by mouth 2 (two) times daily. 10/24/22   Cathren Laine, MD  clobetasol ointment (TEMOVATE) 0.05 % APPLY TO AFFECTED AREAS TWICE DAILY AS NEEDED. DO NOT APPLY TO FACE. 10/16/22   [name], MD  metroNIDAZOLE (FLAGYL) 500 MG tablet Take 1 tablet (500 mg total) by mouth 2 (two) times daily. 10/24/22   Cathren Laine, MD  pantoprazole (PROTONIX) 40 MG tablet Take 1 tablet (40 mg total) by mouth 2 (two) times daily. 10/24/22   Cathren Laine, MD  rosuvastatin (CRESTOR) 20 MG tablet Take 1 tablet (20 mg total) by mouth daily. 04/11/22 04/06/23  Furth, Cadence H, PA-C  traMADol (ULTRAM) 50 MG tablet Take 1 tablet (50 mg total) by mouth every 6 (six) hours as needed. 10/24/22   Cathren Laine, MD  Past Surgical History Past Surgical History:  Procedure Laterality Date   BIOPSY N/A 02/11/2014   Procedure: BIOPSY;  Surgeon: Corbin Ade, MD;  Location: [facility];  Service: Endoscopy;  Laterality: N/A;   CARDIAC CATHETERIZATION  Aug 2015   normal EF, normal coronaries   CARDIAC CATHETERIZATION     CARPAL TUNNEL RELEASE Right 08/03/2021   Procedure: Right CARPAL TUNNEL RELEASE;  Surgeon: Marlyne Beards, MD;  Location: [facility];  Service: Orthopedics;  Laterality: Right;   CARPAL TUNNEL RELEASE Left 11/09/2021   Procedure: LEFT CARPAL TUNNEL RELEASE;  Surgeon: Marlyne Beards, MD;  Location: [facility];  Service: Orthopedics;  Laterality: Left;   ESOPHAGOGASTRODUODENOSCOPY (EGD) WITH PROPOFOL N/A 02/11/2014   Procedure: ESOPHAGOGASTRODUODENOSCOPY (EGD) WITH PROPOFOL;   Surgeon: Corbin Ade, MD;  Location: [facility];  Service: Endoscopy;  Laterality: N/A;   LEFT HEART CATHETERIZATION WITH CORONARY ANGIOGRAM N/A 01/18/2014   Procedure: LEFT HEART CATHETERIZATION WITH CORONARY ANGIOGRAM;  Surgeon: Lennette Bihari, MD;  Location: [facility] CATH [facility];  Service: Cardiovascular;  Laterality: N/A;   Family History Family History  Problem Relation Age of Onset   Cancer Mother        Breast   Colon cancer Neg Hx     Social History Social History   Tobacco Use   Smoking status: Former    Packs/day: 2.00    Years: 10.00    Additional pack years: 0.00    Total pack years: 20.00    Types: Cigarettes    Quit date: 12/05/2020    Years since quitting: 1.9   Smokeless tobacco: Never  Vaping Use   Vaping Use: Never used  Substance Use Topics   Alcohol use: No   Drug use: No   Allergies Patient has no known allergies.  Review of Systems Review of Systems  Gastrointestinal:  Positive for blood in stool and diarrhea.    Physical Exam Vital Signs  I have reviewed the triage vital signs BP 106/75 (BP Location: Right Arm)   Pulse (!) 101   Temp 99.1 F (37.3 C) (Oral)   Resp 20   SpO2 100%   Physical Exam Constitutional:      General: He is not in acute distress.    Appearance: Normal appearance.  HENT:     Head: Normocephalic and atraumatic.     Nose: No congestion or rhinorrhea.  Eyes:     General:        Right eye: No discharge.        Left eye: No discharge.     Extraocular Movements: Extraocular movements intact.     Pupils: Pupils are equal, round, and reactive to light.  Cardiovascular:     Rate and Rhythm: Normal rate and regular rhythm.     Heart sounds: No murmur heard. Pulmonary:     Effort: No respiratory distress.     Breath sounds: No wheezing or rales.  Abdominal:     General: There is no distension.     Tenderness: There is abdominal tenderness.  Musculoskeletal:        General: Normal range of motion.     Cervical back: Normal  range of motion.  Skin:    General: Skin is warm and dry.  Neurological:     General: No focal deficit present.     Mental Status: He is alert.     ED Results and Treatments Labs (all labs ordered are listed, but only abnormal results are displayed) Labs  Reviewed  GASTROINTESTINAL PANEL BY PCR, STOOL (REPLACES STOOL CULTURE)  COMPREHENSIVE METABOLIC PANEL  CBC  POC OCCULT BLOOD, ED  TYPE AND SCREEN                                                                                                                          Radiology No results found.  Pertinent labs & imaging results that were available during my care of the patient were reviewed by me and considered in my medical decision making (see MDM for details).  Medications Ordered in ED Medications  lactated ringers bolus 1,000 mL (has no administration in time range)  ondansetron (ZOFRAN) injection 4 mg (has no administration in time range)  morphine (PF) 4 MG/ML injection 4 mg (has no administration in time range)                                                                                                                                     Procedures Procedures  (including critical care time)  Medical Decision Making / ED Course   This patient presents to the ED for concern of bright red blood per rectum, this involves an extensive number of treatment options, and is a complaint that carries with it a high risk of complications and morbidity.  The differential diagnosis includes upper GI bleed including PUD, varices, boerhaave's, Mallory Weiss tear, aortoenteric fistula versus lower GI bleed including mass, diverticulosis/diverticulitis, hemorrhoids, anal fissure, mesenteric ischemia, aortoenteric fistula, colitis  MDM: Patient seen emergency room for evaluation of bright red blood per rectum.  Physical exam with generalized tenderness over the abdomen worse in left lower quadrant.  Laboratory evaluation with a  leukocytosis to 11.4, hemoglobin 10.9 which is a significant decrease from patient's apparent baseline of around 14 and drastic difference from hemoglobin obtained 6 days ago of 17.5 (likely hemoconcentrated).  CT angio obtained for this hemoglobin drop that shows a small area of hyperintensity on the right wall of the anus, likely internal hemorrhoid.  Patient pain controlled, but repeat H&H showing a hemoglobin of 9.0.  PPI initiated and I spoke with Dr. Marletta Lor of gastroenterology who will come to evaluate the patient.  Patient has not had anything to eat since 5 PM yesterday and thus likely will be prepped tonight for colonoscopy versus flex sig tomorrow.  Patient then admitted.   Additional history obtained: -Additional history obtained from wife -External  records from outside source obtained and reviewed including: Chart review including previous notes, labs, imaging, consultation notes   Lab Tests: -I ordered, reviewed, and interpreted labs.   The pertinent results include:   Labs Reviewed  GASTROINTESTINAL PANEL BY PCR, STOOL (REPLACES STOOL CULTURE)  COMPREHENSIVE METABOLIC PANEL  CBC  POC OCCULT BLOOD, ED  TYPE AND SCREEN       Imaging Studies ordered: I ordered imaging studies including CT angio abdomen pelvis I independently visualized and interpreted imaging. I agree with the radiologist interpretation   Medicines ordered and prescription drug management: Meds ordered this encounter  Medications   lactated ringers bolus 1,000 mL   ondansetron (ZOFRAN) injection 4 mg   morphine (PF) 4 MG/ML injection 4 mg    -I have reviewed the patients home medicines and have made adjustments as needed  Critical interventions none  Consultations Obtained: I requested consultation with the gastroenterologist on-call Dr. Marletta Lor,  and discussed lab and imaging findings as well as pertinent plan - they recommend: Clear liquid diet, prep for colonoscopy versus flex sig  tomorrow   Cardiac Monitoring: The patient was maintained on a cardiac monitor.  I personally viewed and interpreted the cardiac monitored which showed an underlying rhythm of: NSR  Social Determinants of Health:  Factors impacting patients care include: none   Reevaluation: After the interventions noted above, I reevaluated the patient and found that they have :improved  Co morbidities that complicate the patient evaluation  Past Medical History:  Diagnosis Date   Anxiety    Carotid artery disease (HCC)    History of stomach ulcers    Hypertension    Hypertriglyceridemia    Kidney stone    bil , different times  passed stones   Migraines    Obesity (BMI 30-39.9)    Pancreatitis    Stress    Syncope and collapse       Dispostion: I considered admission for this patient, and due to downtrending hemoglobin and concern for GI bleed patient require hospital admission     Final Clinical Impression(s) / ED Diagnoses Final diagnoses:  None     @PCDICTATION @    Glendora Score, MD 10/30/22 [phone]

## 2022-10-30 NOTE — H&P (Addendum)
History and Physical    Patient: Nicolas [name] ZOX:[id number] DOB: 09-03-1975 DOA: 10/30/2022 DOS: the patient was seen and examined on 10/30/2022 PCP: [name], MD  Patient coming from: Home - lives with his wife and kids    Chief Complaint: abdominal pain/rectal bleeding.   HPI: Nicolas [name] is a 47 y.o. male with medical history significant of HTN, HLD, carotid artery stenosis, hx of PUD, GERD, psoriasis who presented to ED with complaints of abdominal pain and rectal bleeding.  He ate at [employer] last night around 5pm. This morning around 7:30am he went to the bathroom and he had bright red blood from his rectum with associated generalized abdominal pain. Pain rated as 6/10, does not radiate. Described as intermittent cramping.  He came to straight to ED. He has not had any more BM or bright red blood from rectum. He has generalized abdominal pain. He was nauseated, but feels fine after the zofran. He never has had any GI bleeding. Never had a colonoscopy. No family history of colon cancer or IBD. He has no mouth sores.   He was seen in ED on 5/14 after having 4 days of diarrhea. CT abdomen/pelvis at that time showed new circumferential wall thickening though the mid and distal portions of the duodenum with moderate regional retroperitoneal inflammatory/edematous changes, suggesting duodenitis. No evidence of perforation or abscess. He was given flagyl and clarithromycin which he has been taking. He states his diarrhea subsided and he felt great until he ate at [employer] last night.    Denies any fevers, has had chills/sweats, vision changes/headaches, chest pain or palpitations, shortness of breath or cough, dysuria or leg swelling.   He does not smoke or drink alcohol.  ER Course:  vitals: afebrile, bp: 115/64, HR: 83, RR: 17, oxygen: 99%RA Pertinent labs: hgb: 10.9>9.0, wbc: 11.4, ALT: 77,  CTA abdomen/pelvis: 1. Small focus of hyperenhancement along the right wall of the  anus, likely representing internal hemorrhoid. Elsewhere, no findings of active GI bleed. 2. No acute abdominopelvic findings. Negative for abdominal aortic aneurysm or dissection. In ED: given 1L IVF, protonix, zofran and morphine. [facility] asked to admit.     Review of Systems: As mentioned in the history of present illness. All other systems reviewed and are negative. Past Medical History:  Diagnosis Date   Anxiety    Carotid artery disease (HCC)    History of stomach ulcers    Hypertension    Hypertriglyceridemia    Kidney stone    bil , different times  passed stones   Migraines    Obesity (BMI 30-39.9)    Pancreatitis    Stress    Syncope and collapse    Past Surgical History:  Procedure Laterality Date   BIOPSY N/A 02/11/2014   Procedure: BIOPSY;  Surgeon: Corbin Ade, MD;  Location: [facility];  Service: Endoscopy;  Laterality: N/A;   CARDIAC CATHETERIZATION  Aug 2015   normal EF, normal coronaries   CARDIAC CATHETERIZATION     CARPAL TUNNEL RELEASE Right 08/03/2021   Procedure: Right CARPAL TUNNEL RELEASE;  Surgeon: Marlyne Beards, MD;  Location: [facility];  Service: Orthopedics;  Laterality: Right;   CARPAL TUNNEL RELEASE Left 11/09/2021   Procedure: LEFT CARPAL TUNNEL RELEASE;  Surgeon: Marlyne Beards, MD;  Location: [facility];  Service: Orthopedics;  Laterality: Left;   ESOPHAGOGASTRODUODENOSCOPY (EGD) WITH PROPOFOL N/A 02/11/2014   Procedure: ESOPHAGOGASTRODUODENOSCOPY (EGD) WITH PROPOFOL;  Surgeon: Corbin Ade, MD;  Location:  [facility];  Service: Endoscopy;  Laterality: N/A;   LEFT HEART CATHETERIZATION WITH CORONARY ANGIOGRAM N/A 01/18/2014   Procedure: LEFT HEART CATHETERIZATION WITH CORONARY ANGIOGRAM;  Surgeon: Lennette Bihari, MD;  Location: [facility] C[facility];  Service: Cardiovascular;  Laterality: N/A;   Social History:  reports that he quit smoking about 22 months ago. His smoking use included cigarettes. He has a 20.00 pack-year smoking  history. He has never used smokeless tobacco. He reports that he does not drink alcohol and does not use drugs.  No Known Allergies  Family History  Problem Relation Age of Onset   Cancer Mother        Breast   Colon cancer Neg Hx     Prior to Admission medications   Medication Sig Start Date End Date Taking? Authorizing Provider  albuterol (VENTOLIN HFA) 108 (90 Base) MCG/ACT inhaler Inhale 2 puffs into the lungs every 4 (four) hours as needed for wheezing or shortness of breath. 07/06/22  Yes Particia Nearing, PA-C  amLODipine (NORVASC) 5 MG tablet TAKE 1 TABLET BY MOUTH ONCE DAILY. Patient taking differently: Take 5 mg by mouth daily. 09/01/22  Yes [name], MD  aspirin EC 81 MG tablet Take 1 tablet (81 mg total) by mouth daily. Swallow whole. 04/11/22  Yes Furth, Cadence H, PA-C  clarithromycin (BIAXIN) 500 MG tablet Take 1 tablet (500 mg total) by mouth 2 (two) times daily. 10/24/22  Yes Cathren Laine, MD  clobetasol ointment (TEMOVATE) 0.05 % APPLY TO AFFECTED AREAS TWICE DAILY AS NEEDED. DO NOT APPLY TO FACE. Patient taking differently: Apply 1 Application topically 2 (two) times daily. 10/16/22  Yes [name], MD  famotidine (PEPCID) 20 MG tablet Take 20 mg by mouth 2 (two) times daily. 09/09/22  Yes [provider]  metroNIDAZOLE (FLAGYL) 500 MG tablet Take 1 tablet (500 mg total) by mouth 2 (two) times daily. 10/24/22  Yes Cathren Laine, MD  pantoprazole (PROTONIX) 40 MG tablet Take 1 tablet (40 mg total) by mouth 2 (two) times daily. 10/24/22  Yes Cathren Laine, MD  rosuvastatin (CRESTOR) 20 MG tablet Take 1 tablet (20 mg total) by mouth daily. 04/11/22 04/06/23 Yes Furth, Cadence H, PA-C  SKYRIZI PEN 150 MG/ML SOAJ Inject 150 mg into the skin See admin instructions.   Yes [provider]  traMADol (ULTRAM) 50 MG tablet Take 1 tablet (50 mg total) by mouth every 6 (six) hours as needed. Patient taking differently: Take 50 mg by mouth every 6 (six)  hours as needed for moderate pain. 10/24/22  Yes Cathren Laine, MD    Physical Exam: Vitals:   10/30/22 1700 10/30/22 1823 10/30/22 1830 10/30/22 1900  BP: (!) 132/90  124/67 121/66  Pulse:   90 88  Resp:   15 15  Temp:  98.9 F (37.2 C)    TempSrc:  Oral    SpO2:   98% 97%  Weight:  127.6 kg    Height:  6\' 3"  (1.905 m)     General:  Appears calm and comfortable and is in NAD. obese Eyes:  PERRL, EOMI, normal lids, iris ENT:  grossly normal hearing, lips & tongue, mmm; appropriate dentition Neck:  no LAD, masses or thyromegaly; faint carotid bruit left  Cardiovascular:  RRR, no m/r/g. No LE edema.  Respiratory:   CTA bilaterally with no wheezes/rales/rhonchi.  Normal respiratory effort. Abdomen:  soft, diffusely TTP > in lower quadrants and epigastric area. BS +  Back:   normal alignment,  no CVAT Skin:  no rash or induration seen on limited exam Musculoskeletal:  grossly normal tone BUE/BLE, good ROM, no bony abnormality Lower extremity:  No LE edema.  Limited foot exam with no ulcerations.  2+ distal pulses. Psychiatric:  grossly normal mood and affect, speech fluent and appropriate, AOx3 Neurologic:  CN 2-12 grossly intact, moves all extremities in coordinated fashion, sensation intact   Radiological Exams on Admission: Independently reviewed - see discussion in A/P where applicable  CT ANGIO ABDOMEN PELVIS  W & WO CONTRAST  Result Date: 10/30/2022 CLINICAL DATA:  Bright red blood per rectum EXAM: CTA ABDOMEN AND PELVIS WITHOUT AND WITH CONTRAST TECHNIQUE: Multidetector CT imaging of the abdomen and pelvis was performed using the standard protocol during bolus administration of intravenous contrast. Multiplanar reconstructed images and MIPs were obtained and reviewed to evaluate the vascular anatomy. RADIATION DOSE REDUCTION: This exam was performed according to the departmental dose-optimization program which includes automated exposure control, adjustment of the mA and/or kV  according to patient size and/or use of iterative reconstruction technique. CONTRAST:  OMNIPAQUE IOHEXOL 350 MG/ML SOLN COMPARISON:  10/24/2022, 02/09/2014 FINDINGS: VASCULAR Aorta: Normal caliber aorta without aneurysm, dissection, vasculitis or significant stenosis. Mild calcified and noncalcified atherosclerotic plaque. Celiac: Patent without evidence of aneurysm, dissection, vasculitis or significant stenosis. SMA: Patent without evidence of aneurysm, dissection, vasculitis or significant stenosis. Common hepatic artery arises from the SMA, an anatomic variant. Renals: Both renal arteries are patent without evidence of aneurysm, dissection, vasculitis, fibromuscular dysplasia or significant stenosis. IMA: Patent. Inflow: Patent without evidence of aneurysm, dissection, vasculitis or significant stenosis. Proximal Outflow: Bilateral common femoral and visualized portions of the superficial and profunda femoral arteries are patent without evidence of aneurysm, dissection, vasculitis or significant stenosis. Veins: Major venous structures are patent by portal venous phase imaging. Review of the MIP images confirms the above findings. NON-VASCULAR Lower chest: Included lung bases are clear.  Heart size is normal. Hepatobiliary: No focal liver abnormality is seen. No gallstones, gallbladder wall thickening, or biliary dilatation. Pancreas: Unremarkable. No pancreatic ductal dilatation or surrounding inflammatory changes. Spleen: Normal in size without focal abnormality. Adrenals/Urinary Tract: Unchanged appearance of the adrenal glands with coarse calcifications present bilaterally. Simple left renal cyst which does not require follow-up imaging. Kidneys enhance symmetrically. No renal stone or hydronephrosis. Urinary bladder within normal limits. Stomach/Bowel: Stomach within normal limits. No dilated loops of bowel. Normal appendix in the right lower quadrant (series 15, image 71) no focal bowel wall  thickening or inflammatory changes. Small focus of hyperenhancement along the right wall of the anus (series 9, image 24), likely representing internal hemorrhoid. Elsewhere, no abnormal contrast accumulation within the bowel. Lymphatic: Mildly enlarged lymph node in the porta hepatis measuring 1.4 cm in short axis (series 15, image 30), unchanged since at least 2015 and considered benign. No new or enlarging abdominopelvic lymph nodes. Reproductive: Prostate is unremarkable. Other: No free fluid. No abdominopelvic fluid collection. No pneumoperitoneum. No abdominal wall hernia. Musculoskeletal: No acute or significant osseous findings. IMPRESSION: 1. Small focus of hyperenhancement along the right wall of the anus, likely representing internal hemorrhoid. Elsewhere, no findings of active GI bleed. 2. No acute abdominopelvic findings. Negative for abdominal aortic aneurysm or dissection. Electronically Signed   By: Duanne Guess D.O.   On: 10/30/2022 12:30    EKG: pending    Labs on Admission: I have personally reviewed the available labs and imaging studies at the time of the admission.  Pertinent labs:  hgb: 10.9>9.0,  wbc: 11.4,  ALT: 77,  Assessment and Plan: Principal Problem:   Acute GI bleeding Active Problems:   Diarrhea   Elevated ALT measurement   Essential hypertension   Hyperlipidemia   Stenosis of carotid artery   Prediabetes   Plaque psoriasis   GERD (gastroesophageal reflux disease)    Assessment and Plan: * Acute GI bleeding 47 year old with acute onset of bright red blood per rectum this morning with associated generalized abdominal pain -obs to stepdown/progressive -hgb with large drop 14.6>17.5 (5/14)>10.9>9.0 -CTA abdo/pelvis with no findings of active bleed. Does have internal hemorrhoid -CT abdomen/pelvis on 5/14 suggestive of duodenitis and has been taking flagyl/clarithromycin x 7 days  -no hx of familial colon cancer/weight loss or anemia -will check  inflammatory markers for IBD especially with known history of psoriasis +TSH -trend CBC q 6 hours and will transfuse if hgb <7 -type and screen -continue IV protonix  -clear liquid diet and NPO after midnight -GI consulted with plans for colonoscopy tomorrow   Diarrhea History of 4 days of diarrhea on 5/14 with findings on CT suggestive of duodenitis Stool PCR +c.diff pending since on antibiotics Enteric precautions  No fever/chills. Hold abx for now    Elevated ALT measurement Possibly secondary to GI viral illness vs. Skyrizi  Continue to trend    Essential hypertension Well controlled and has not had medication today Hold norvasc for now   Hyperlipidemia Continue crestor 20mg  daily   Stenosis of carotid artery Followed by cardiology -imaging 03/2022: 1. Mild (1-49%) stenosis proximal right internal carotid artery secondary to heterogenous atherosclerotic plaque. 2. Moderate (50-69%) stenosis proximal left internal carotid artery secondary to heterogenous atherosclerotic plaque. Hold ASA in setting of GI bleed Continue statin   Prediabetes Persistent hyperglycemia with prediabetes in 04/2022 with A1C of 6.3 Repeat A1C today, carb modified diet Lifestyle modifications   Plaque psoriasis Started on Skirizi this month and had his first injection on 10/19/22.  Rule out IBD as can see psoriasis with IBD   GERD (gastroesophageal reflux disease) Continue IV Protonix     Advance Care Planning:   Code Status: Full Code   Consults: GI: Dr. Marletta Lor   DVT Prophylaxis: SCDs  Family Communication: none   Severity of Illness: The appropriate patient status for this patient is OBSERVATION. Observation status is judged to be reasonable and necessary in order to provide the required intensity of service to ensure the patient's safety. The patient's presenting symptoms, physical exam findings, and initial radiographic and laboratory data in the context of their medical condition  is felt to place them at decreased risk for further clinical deterioration. Furthermore, it is anticipated that the patient will be medically stable for discharge from the hospital within 2 midnights of admission.   Author: Orland Mustard, MD 10/30/2022 8:01 PM  For on call review [email or web address].

## 2022-10-30 NOTE — ED Triage Notes (Signed)
Pt states he has had bright red blood in his stools since last night

## 2022-10-30 NOTE — Assessment & Plan Note (Signed)
Persistent hyperglycemia with prediabetes in 04/2022 with A1C of 6.3 Repeat A1C today, carb modified diet Lifestyle modifications

## 2022-10-30 NOTE — Assessment & Plan Note (Addendum)
History of 4 days of diarrhea on 5/14 with findings on CT suggestive of duodenitis Stool PCR +c.diff pending since on antibiotics Enteric precautions  No fever/chills. Hold abx for now

## 2022-10-30 NOTE — Assessment & Plan Note (Signed)
Possibly secondary to GI viral illness vs. Skyrizi  Continue to trend

## 2022-10-30 NOTE — ED Provider Notes (Signed)
    ED Course / MDM   Clinical Course as of 10/30/22 1624  Mon Oct 30, 2022  1519 Received sign out from previous provider Dr. Posey Rea, presenting with rectal bleeding and diarrhea. Pending repeat H&H.  [WS]  1624 Signed out to Dr. Artis Flock, who has admitted the patient.  [WS]    Clinical Course User Index [WS] Lonell Grandchild, MD   Medical Decision Making Amount and/or Complexity of Data Reviewed Labs: ordered. Radiology: ordered.  Risk Prescription drug management. Decision regarding hospitalization.          Lonell Grandchild, MD 10/30/22 [phone]

## 2022-10-31 ENCOUNTER — Encounter (HOSPITAL_COMMUNITY)
Admission: EM | Disposition: A | Discharge status: Home / Self Care | Payer: Self-pay | Source: Home / Self Care | Attending: Family Medicine

## 2022-10-31 ENCOUNTER — Observation Stay (HOSPITAL_COMMUNITY): Payer: 59 | Admitting: Anesthesiology

## 2022-10-31 DIAGNOSIS — K2980 Duodenitis without bleeding: Secondary | ICD-10-CM | POA: Diagnosis not present

## 2022-10-31 DIAGNOSIS — D126 Benign neoplasm of colon, unspecified: Secondary | ICD-10-CM | POA: Diagnosis not present

## 2022-10-31 DIAGNOSIS — K269 Duodenal ulcer, unspecified as acute or chronic, without hemorrhage or perforation: Secondary | ICD-10-CM

## 2022-10-31 DIAGNOSIS — I6523 Occlusion and stenosis of bilateral carotid arteries: Secondary | ICD-10-CM | POA: Diagnosis not present

## 2022-10-31 DIAGNOSIS — K319 Disease of stomach and duodenum, unspecified: Secondary | ICD-10-CM | POA: Diagnosis not present

## 2022-10-31 DIAGNOSIS — E785 Hyperlipidemia, unspecified: Secondary | ICD-10-CM | POA: Diagnosis not present

## 2022-10-31 DIAGNOSIS — K922 Gastrointestinal hemorrhage, unspecified: Secondary | ICD-10-CM | POA: Diagnosis present

## 2022-10-31 DIAGNOSIS — D630 Anemia in neoplastic disease: Secondary | ICD-10-CM | POA: Diagnosis not present

## 2022-10-31 DIAGNOSIS — F419 Anxiety disorder, unspecified: Secondary | ICD-10-CM | POA: Diagnosis not present

## 2022-10-31 DIAGNOSIS — K2981 Duodenitis with bleeding: Secondary | ICD-10-CM | POA: Diagnosis not present

## 2022-10-31 DIAGNOSIS — D649 Anemia, unspecified: Secondary | ICD-10-CM | POA: Diagnosis not present

## 2022-10-31 DIAGNOSIS — Z8711 Personal history of peptic ulcer disease: Secondary | ICD-10-CM | POA: Diagnosis not present

## 2022-10-31 DIAGNOSIS — D62 Acute posthemorrhagic anemia: Secondary | ICD-10-CM | POA: Diagnosis not present

## 2022-10-31 DIAGNOSIS — K921 Melena: Secondary | ICD-10-CM

## 2022-10-31 DIAGNOSIS — E1165 Type 2 diabetes mellitus with hyperglycemia: Secondary | ICD-10-CM | POA: Diagnosis not present

## 2022-10-31 DIAGNOSIS — K3189 Other diseases of stomach and duodenum: Secondary | ICD-10-CM

## 2022-10-31 DIAGNOSIS — D122 Benign neoplasm of ascending colon: Secondary | ICD-10-CM

## 2022-10-31 DIAGNOSIS — K648 Other hemorrhoids: Secondary | ICD-10-CM | POA: Diagnosis not present

## 2022-10-31 DIAGNOSIS — Z87891 Personal history of nicotine dependence: Secondary | ICD-10-CM | POA: Diagnosis not present

## 2022-10-31 DIAGNOSIS — K5730 Diverticulosis of large intestine without perforation or abscess without bleeding: Secondary | ICD-10-CM | POA: Diagnosis not present

## 2022-10-31 DIAGNOSIS — K264 Chronic or unspecified duodenal ulcer with hemorrhage: Secondary | ICD-10-CM | POA: Diagnosis not present

## 2022-10-31 DIAGNOSIS — Z79899 Other long term (current) drug therapy: Secondary | ICD-10-CM | POA: Diagnosis not present

## 2022-10-31 DIAGNOSIS — K635 Polyp of colon: Secondary | ICD-10-CM | POA: Diagnosis not present

## 2022-10-31 DIAGNOSIS — Z7982 Long term (current) use of aspirin: Secondary | ICD-10-CM | POA: Diagnosis not present

## 2022-10-31 DIAGNOSIS — I10 Essential (primary) hypertension: Secondary | ICD-10-CM

## 2022-10-31 DIAGNOSIS — K5731 Diverticulosis of large intestine without perforation or abscess with bleeding: Secondary | ICD-10-CM | POA: Diagnosis not present

## 2022-10-31 DIAGNOSIS — K311 Adult hypertrophic pyloric stenosis: Secondary | ICD-10-CM | POA: Diagnosis not present

## 2022-10-31 DIAGNOSIS — E669 Obesity, unspecified: Secondary | ICD-10-CM | POA: Diagnosis not present

## 2022-10-31 DIAGNOSIS — Z6835 Body mass index (BMI) 35.0-35.9, adult: Secondary | ICD-10-CM | POA: Diagnosis not present

## 2022-10-31 DIAGNOSIS — E119 Type 2 diabetes mellitus without complications: Secondary | ICD-10-CM

## 2022-10-31 DIAGNOSIS — L400 Psoriasis vulgaris: Secondary | ICD-10-CM | POA: Diagnosis not present

## 2022-10-31 DIAGNOSIS — K219 Gastro-esophageal reflux disease without esophagitis: Secondary | ICD-10-CM | POA: Diagnosis not present

## 2022-10-31 HISTORY — PX: COLONOSCOPY WITH PROPOFOL: SHX5780

## 2022-10-31 HISTORY — PX: POLYPECTOMY: SHX149

## 2022-10-31 HISTORY — PX: ESOPHAGOGASTRODUODENOSCOPY (EGD) WITH PROPOFOL: SHX5813

## 2022-10-31 HISTORY — PX: BIOPSY: SHX5522

## 2022-10-31 HISTORY — PX: SUBMUCOSAL TATTOO INJECTION: SHX6856

## 2022-10-31 LAB — GASTROINTESTINAL PANEL BY PCR, STOOL (REPLACES STOOL CULTURE)

## 2022-10-31 LAB — COMPREHENSIVE METABOLIC PANEL
ALT: 59 U/L — ABNORMAL HIGH (ref 0–44)
AST: 30 U/L (ref 15–41)
Albumin: 2.6 g/dL — ABNORMAL LOW (ref 3.5–5.0)
Alkaline Phosphatase: 40 U/L (ref 38–126)
Anion gap: 4 — ABNORMAL LOW (ref 5–15)
BUN: 21 mg/dL — ABNORMAL HIGH (ref 6–20)
CO2: 27 mmol/L (ref 22–32)
Calcium: 7.8 mg/dL — ABNORMAL LOW (ref 8.9–10.3)
Chloride: 103 mmol/L (ref 98–111)
Creatinine, Ser: 0.82 mg/dL (ref 0.61–1.24)
GFR, Estimated: >60 mL/min (ref >60)
Glucose, Bld: 141 mg/dL — ABNORMAL HIGH (ref 70–99)
Potassium: 3.7 mmol/L (ref 3.5–5.1)
Sodium: 134 mmol/L — ABNORMAL LOW (ref 135–145)
Total Bilirubin: 0.7 mg/dL (ref 0.3–1.2)
Total Protein: 4.7 g/dL — ABNORMAL LOW (ref 6.5–8.1)

## 2022-10-31 LAB — CBC
HCT: 23.4 % — ABNORMAL LOW (ref 39.0–52.0)
Hemoglobin: 8.1 g/dL — ABNORMAL LOW (ref 13.0–17.0)
MCH: 30.6 pg (ref 26.0–34.0)
MCHC: 34.6 g/dL (ref 30.0–36.0)
MCV: 88.3 fL (ref 80.0–100.0)
Platelets: 294 10*3/uL (ref 150–400)
RBC: 2.65 MIL/uL — ABNORMAL LOW (ref 4.22–5.81)
RDW: 15.1 % (ref 11.5–15.5)
WBC: 8.8 10*3/uL (ref 4.0–10.5)
nRBC: 0 % (ref 0.0–0.2)

## 2022-10-31 LAB — OCCULT BLOOD X 1 CARD TO LAB, STOOL: Fecal Occult Bld: POSITIVE — AB

## 2022-10-31 LAB — C DIFFICILE QUICK SCREEN W PCR REFLEX
C Diff antigen: NEGATIVE
C Diff interpretation: NOT DETECTED
C Diff toxin: NEGATIVE

## 2022-10-31 SURGERY — COLONOSCOPY WITH PROPOFOL
Anesthesia: General

## 2022-10-31 MED ORDER — PROPOFOL 10 MG/ML IV BOLUS
INTRAVENOUS | Status: DC | PRN
  Administered 2022-10-31: 100 mg via INTRAVENOUS
  Administered 2022-10-31: 50 mg via INTRAVENOUS

## 2022-10-31 MED ORDER — DEXMEDETOMIDINE HCL IN NACL 80 MCG/20ML IV SOLN
INTRAVENOUS | Status: DC | PRN
  Administered 2022-10-31: 8 ug via INTRAVENOUS
  Administered 2022-10-31: 12 ug via INTRAVENOUS

## 2022-10-31 MED ORDER — PHENYLEPHRINE 80 MCG/ML (10ML) SYRINGE FOR IV PUSH (FOR BLOOD PRESSURE SUPPORT)
PREFILLED_SYRINGE | INTRAVENOUS | Status: DC | PRN
  Administered 2022-10-31 (×2): 160 ug via INTRAVENOUS
  Administered 2022-10-31 (×2): 80 ug via INTRAVENOUS

## 2022-10-31 MED ORDER — LACTATED RINGERS IV SOLN: INTRAVENOUS | Status: DC

## 2022-10-31 MED ORDER — MIDAZOLAM HCL 2 MG/2ML IJ SOLN
2.0000 mg | Freq: Once | INTRAMUSCULAR | Status: AC
  Administered 2022-10-31: 2 mg via INTRAVENOUS

## 2022-10-31 MED ORDER — MIDAZOLAM HCL 2 MG/2ML IJ SOLN
INTRAMUSCULAR | Status: AC
  Filled 2022-10-31: qty 2

## 2022-10-31 MED ORDER — SODIUM CHLORIDE 0.9 % IV SOLN: INTRAVENOUS | Status: DC

## 2022-10-31 MED ORDER — LIDOCAINE HCL (CARDIAC) PF 100 MG/5ML IV SOSY
PREFILLED_SYRINGE | INTRAVENOUS | Status: DC | PRN
  Administered 2022-10-31: 50 mg via INTRAVENOUS

## 2022-10-31 MED ORDER — SODIUM CHLORIDE 0.9 % IV SOLN: INTRAVENOUS | Status: AC

## 2022-10-31 MED ORDER — PROPOFOL 500 MG/50ML IV EMUL
INTRAVENOUS | Status: DC | PRN
  Administered 2022-10-31: 100 ug/kg/min via INTRAVENOUS
  Administered 2022-10-31: 200 ug/kg/min via INTRAVENOUS

## 2022-10-31 MED ORDER — SPOT INK MARKER SYRINGE KIT
PACK | SUBMUCOSAL | Status: DC | PRN
  Administered 2022-10-31: 4 mL via SUBMUCOSAL

## 2022-10-31 MED ORDER — PHENYLEPHRINE 80 MCG/ML (10ML) SYRINGE FOR IV PUSH (FOR BLOOD PRESSURE SUPPORT)
PREFILLED_SYRINGE | INTRAVENOUS | Status: AC
  Filled 2022-10-31: qty 10

## 2022-10-31 NOTE — Anesthesia Postprocedure Evaluation (Signed)
Anesthesia Post Note  Patient: IMARI WOLBECK  Procedure(s) Performed: COLONOSCOPY WITH PROPOFOL ESOPHAGOGASTRODUODENOSCOPY (EGD) WITH PROPOFOL BIOPSY SUBMUCOSAL TATTOO INJECTION POLYPECTOMY INTESTINAL  Patient location during evaluation: Phase II Anesthesia Type: General Level of consciousness: awake and alert and oriented Pain management: pain level controlled Vital Signs Assessment: post-procedure vital signs reviewed and stable Respiratory status: spontaneous breathing, nonlabored ventilation and respiratory function stable Cardiovascular status: blood pressure returned to baseline and stable Postop Assessment: no apparent nausea or vomiting Anesthetic complications: no  No notable events documented.   Last Vitals:  Vitals:   10/31/22 1233 10/31/22 1245  BP: (!) 103/46 (!) 134/98  Pulse: 79 77  Resp: 14 13  Temp: 36.9 C   SpO2: 100% 100%    Last Pain:  Vitals:   10/31/22 1245  TempSrc:   PainSc: 0-No pain                 Sovereign Ramiro C Zyler Hyson

## 2022-10-31 NOTE — Op Note (Signed)
[facility] Patient Name: Nicolas [name] Procedure Date: 10/31/2022 11:34 AM MRN: [id number] Date of Birth: Aug 24, 1975 Attending MD: Katrinka Blazing , , [id number] CSN: [id number] Age: 47 Admit Type: Inpatient Procedure:                Upper GI endoscopy Indications:              Hematochezia Providers:                Katrinka Blazing, Buel Ream. Thomasena Edis RN, RN, Ina Homes, Technician Referring MD:              Medicines:                Monitored Anesthesia Care Complications:            No immediate complications. Estimated Blood Loss:     Estimated blood loss: none. Procedure:                Pre-Anesthesia Assessment:                           - Prior to the procedure, a History and Physical                            was performed, and patient medications, allergies                            and sensitivities were reviewed. The patient's                            tolerance of previous anesthesia was reviewed.                           - The risks and benefits of the procedure and the                            sedation options and risks were discussed with the                            patient. All questions were answered and informed                            consent was obtained.                           - ASA Grade Assessment: II - A patient with mild                            systemic disease.                           After obtaining informed consent, the endoscope was                            passed under direct vision. Throughout the  procedure, the patient's blood pressure, pulse, and                            oxygen saturations were monitored continuously. The                            [id number]-[id number] ([id number]) scope was introduced through the                            mouth, and advanced to the second part of duodenum.                            The upper GI endoscopy was accomplished without                             difficulty. The patient tolerated the procedure                            well. Scope In: 11:44:57 AM Scope Out: 12:00:08 PM Total Procedure Duration: 0 hours 15 minutes 11 seconds  Findings:      The examined esophagus was normal.      Two 3 to 10 mm scars were found in the gastric antrum. The scar tissue       was healthy in appearance. Biopsies from body and antrum were taken with       a cold forceps for Helicobacter pylori testing.      A benign-appearing, intrinsic mild stenosis was found at the pylorus,       likely due to scarring. This was traversed.      Segmental moderate inflammation characterized by congestion (edema) and       erythema was found in the entire duodenum.      Dispersed 5 to 15 mm linear scars were found in the second portion of       the duodenum. There was assocaited nodularity to these areas. Biopsies       were taken with a cold forceps for histology.      One non-bleeding superficial duodenal ulcer with a clean ulcer base       (Forrest Class III) was found in the ampulla. there was some associated       nodularity. The lesion was 6 mm in largest dimension. Biopsies were       taken with a cold forceps for histology. Impression:               - Normal esophagus.                           - Scars in the gastric antrum. Biopsied.                           - Gastric stenosis was found at the pylorus.                           - Duodenitis.                           - Duodenal scars with associated nodularity.  Biopsied.                           - Non-bleeding duodenal ulcer with a clean ulcer                            base (Forrest Class III). Biopsied. Moderate Sedation:      Per Anesthesia Care Recommendation:           - No aspirin, ibuprofen, naproxen, or other                            non-steroidal anti-inflammatory drugs.                           - Use Protonix (pantoprazole) 40 mg PO BID.                            - Await pathology results.                           - Proceed with colonoscopy. Procedure Code(s):        --- Professional ---                           [phone], Esophagogastroduodenoscopy, flexible,                            transoral; with biopsy, single or multiple Diagnosis Code(s):        --- Professional ---                           K31.89, Other diseases of stomach and duodenum                           K31.1, Adult hypertrophic pyloric stenosis                           K29.80, Duodenitis without bleeding                           K26.9, Duodenal ulcer, unspecified as acute or                            chronic, without hemorrhage or perforation                           K92.1, Melena (includes Hematochezia) CPT copyright 2022 American Medical Association. All rights reserved. The codes documented in this report are preliminary and upon coder review may  be revised to meet current compliance requirements. Katrinka Blazing, MD Katrinka Blazing,  10/31/2022 12:35:34 PM This report has been signed electronically. Number of Addenda: 0

## 2022-10-31 NOTE — Brief Op Note (Addendum)
10/31/2022  12:33 PM  PATIENT:  Nicolas [name]  47 y.o. male  PRE-OPERATIVE DIAGNOSIS:  rectal bleeding, acute blood loss anemia  POST-OPERATIVE DIAGNOSIS:  EGD: gastric scars x2, gastric congestion,small bowel ulcer COLON: tattoo in small bowel; ascending colon polyp (CS); diverticulosis;  PROCEDURE:  Procedure(s): COLONOSCOPY WITH PROPOFOL (N/A) ESOPHAGOGASTRODUODENOSCOPY (EGD) WITH PROPOFOL (N/A) BIOPSY SUBMUCOSAL TATTOO INJECTION POLYPECTOMY INTESTINAL  SURGEON:  Surgeon(s) and Role:    * Dolores Frame, MD - Primary  Patient underwent EGD and colonoscopy under propofol sedation.  Tolerated the procedure adequately.   EGD FINDINGS: - The examined esophagus was normal.  - Two 3 to 10 mm scars were found in the gastric antrum.  The scar tissue was healthy in appearance.  Biopsies from body and antrum were taken with a cold forceps for Helicobacter pylori testing.  - A benign-appearing, intrinsic mild stenosis was found at the pylorus, likely due to scarring.  This was traversed.  - Segmental moderate inflammation characterized by congestion (edema) and erythema was found in the entire duodenum.  - Dispersed 5 to 15 mm linear scars were found in the second portion of the duodenum.  There was assocaited nodularity to these areas. Biopsies were taken with a cold forceps for histology.  - One non-bleeding superficial duodenal ulcer with a clean ulcer base (Forrest Class III) was found in the ampulla. there was some associated nodularity. The lesion was 6 mm in largest dimension.  Biopsies were taken with a cold forceps for histology.   COLONOSCOPY FINDINGS: - The examined portion of the ileum was normal.  Tattooed.  - One 4 mm polyp in the ascending colon, removed with a cold snare.  Resected and retrieved.  - Diverticulosis in the sigmoid colon.  - The distal rectum and anal verge are normal on retroflexion view.   RECOMMENDATIONS - Return patient to hospital ward for  ongoing care.  - Clear liquid diet today.  - Trend H/H daily. - If recurrent clinical bleeding or further drop in Hb, will need to proceed with capsule endoscopy. - No aspirin, ibuprofen, naproxen, or other non-steroidal anti-inflammatory drugs.  - Use Protonix (pantoprazole) 40 mg PO BID.  - Await pathology results.  Repeat colonoscopy for surveillance based on pathology results.   Katrinka Blazing, MD Gastroenterology and Hepatology [facility]

## 2022-10-31 NOTE — Progress Notes (Signed)
Patient returned from scheduled endoscopy (EGD) this morning, patient in stable condition, diet upgraded to clear liquids, ambulated to in room bathroom independently. Laboratory called to report Norovirus resulted from GI panel taken today, physician notified, no new orders received, enteric precautions already in place, will continue to follow enteric precautions.  Patient tolerating CLD with nausea or vomiting reported from patient.

## 2022-10-31 NOTE — Op Note (Signed)
[facility] Patient Name: Nicolas [name] Procedure Date: 10/31/2022 12:04 PM MRN: [id number] Date of Birth: [birth year] Attending MD: Katrinka Blazing , , [id number] CSN: [id number] Age: 47 Admit Type: Outpatient Procedure:                Colonoscopy Indications:              Hematochezia Providers:                Katrinka Blazing, Crystal Page, Pandora Leiter,                            Technician Referring MD:              Medicines:                Monitored Anesthesia Care Complications:            No immediate complications. Estimated Blood Loss:     Estimated blood loss: none. Procedure:                Pre-Anesthesia Assessment:                           - Prior to the procedure, a History and Physical                            was performed, and patient medications, allergies                            and sensitivities were reviewed. The patient's                            tolerance of previous anesthesia was reviewed.                           - The risks and benefits of the procedure and the                            sedation options and risks were discussed with the                            patient. All questions were answered and informed                            consent was obtained.                           - ASA Grade Assessment: II - A patient with mild                            systemic disease.                           After obtaining informed consent, the colonoscope                            was passed under direct vision. Throughout the  procedure, the patient's blood pressure, pulse, and                            oxygen saturations were monitored continuously. The                            [id number]-HQ190L ([id number]) scope was introduced through                            the anus and advanced to the 20 cm into the ileum.                            The colonoscopy was performed without difficulty.                            The  patient tolerated the procedure well. The                            quality of the bowel preparation was adequate. Scope In: 12:07:24 PM Scope Out: 12:28:36 PM Scope Withdrawal Time: 0 hours 17 minutes 6 seconds  Total Procedure Duration: 0 hours 21 minutes 12 seconds  Findings:      The perianal and digital rectal examinations were normal.      The terminal ileum appeared normal. Area was tattooed with an injection       of 4 mL of Spot (carbon black) to mark most distal are reached (roughly       20 cm).      A 4 mm polyp was found in the ascending colon. The polyp was sessile.       The polyp was removed with a cold snare. Resection and retrieval were       complete.      A few small-mouthed diverticula were found in the sigmoid colon.      The retroflexed view of the distal rectum and anal verge was normal and       showed no anal or rectal abnormalities. Impression:               - The examined portion of the ileum was normal.                            Tattooed.                           - One 4 mm polyp in the ascending colon, removed                            with a cold snare. Resected and retrieved.                           - Diverticulosis in the sigmoid colon.                           - The distal rectum and anal verge are normal on  retroflexion view. Moderate Sedation:      Per Anesthesia Care Recommendation:           - Return patient to hospital ward for ongoing care.                           - Clear liquid diet today.                           - Trend H/H daily.                           - If recurrent clinical bleeding or further drop in                            Hb, will need to proceed with capsule endoscopy.                           - Repeat colonoscopy for surveillance based on                            pathology results. Procedure Code(s):        --- Professional ---                           [phone], Colonoscopy, flexible; with  removal of                            tumor(s), polyp(s), or other lesion(s) by snare                            technique                           45381, Colonoscopy, flexible; with directed                            submucosal injection(s), any substance Diagnosis Code(s):        --- Professional ---                           D12.2, Benign neoplasm of ascending colon                           K92.1, Melena (includes Hematochezia)                           K57.30, Diverticulosis of large intestine without                            perforation or abscess without bleeding CPT copyright 2022 American [employer] Association. All rights reserved. The codes documented in this report are preliminary and upon coder review may  be revised to meet current compliance requirements. Katrinka Blazing, MD Katrinka Blazing,  10/31/2022 12:46:03 PM This report has been signed electronically. Number of Addenda: 0

## 2022-10-31 NOTE — Anesthesia Preprocedure Evaluation (Signed)
Anesthesia Evaluation  Patient identified by MRN, date of birth, ID band Patient awake    Reviewed: Allergy & Precautions, H&P , NPO status , Patient's Chart, lab work & pertinent test results  History of Anesthesia Complications Negative for: history of anesthetic complications  Airway Mallampati: III  TM Distance: >3 FB Neck ROM: Full    Dental  (+) Dental Advisory Given, Teeth Intact   Pulmonary sleep apnea (snoring) , former smoker   Pulmonary exam normal breath sounds clear to auscultation       Cardiovascular hypertension, Pt. on medications Normal cardiovascular exam Rhythm:Regular Rate:Normal  1. Left ventricular ejection fraction, by estimation, is 60 to 65%. The  left ventricle has normal function. The left ventricle has no regional  wall motion abnormalities. Left ventricular diastolic parameters were  normal.   2. Right ventricular systolic function is normal. The right ventricular  size is normal. Tricuspid regurgitation signal is inadequate for assessing  PA pressure.   3. The mitral valve was not well visualized. No evidence of mitral valve  regurgitation. No evidence of mitral stenosis.   4. The aortic valve was not well visualized. Aortic valve regurgitation  is not visualized. No aortic stenosis is present.   5. The inferior vena cava is dilated in size with >50% respiratory  variability, suggesting right atrial pressure of 8 mmHg.   Elevated troponins   Neuro/Psych  Headaches PSYCHIATRIC DISORDERS Anxiety      Neuromuscular disease    GI/Hepatic Neg liver ROS, PUD,GERD  Medicated,,GI bleeding    Endo/Other  negative endocrine ROS    Renal/GU Renal disease (stones)  negative genitourinary   Musculoskeletal negative musculoskeletal ROS (+)    Abdominal   Peds negative pediatric ROS (+)  Hematology  (+) Blood dyscrasia, anemia   Anesthesia Other Findings   Reproductive/Obstetrics negative  OB ROS                             Anesthesia Physical Anesthesia Plan  ASA: 3  Anesthesia Plan: General   Post-op Pain Management: Minimal or no pain anticipated   Induction: Intravenous  PONV Risk Score and Plan: Propofol infusion  Airway Management Planned: Nasal Cannula and Natural Airway  Additional Equipment:   Intra-op Plan:   Post-operative Plan:   Informed Consent: I have reviewed the patients History and Physical, chart, labs and discussed the procedure including the risks, benefits and alternatives for the proposed anesthesia with the patient or authorized representative who has indicated his/her understanding and acceptance.     Dental advisory given  Plan Discussed with: CRNA and Surgeon  Anesthesia Plan Comments:         Anesthesia Quick Evaluation

## 2022-10-31 NOTE — Transfer of Care (Signed)
Immediate Anesthesia Transfer of Care Note  Patient: ASHTIAN BOHNING  Procedure(s) Performed: COLONOSCOPY WITH PROPOFOL ESOPHAGOGASTRODUODENOSCOPY (EGD) WITH PROPOFOL BIOPSY SUBMUCOSAL TATTOO INJECTION POLYPECTOMY INTESTINAL  Patient Location: PACU  Anesthesia Type:General  Level of Consciousness: awake and patient cooperative  Airway & Oxygen Therapy: Patient Spontanous Breathing  Post-op Assessment: Report given to RN and Post -op Vital signs reviewed and stable  Post vital signs: Reviewed and stable  Last Vitals:  Vitals Value Taken Time  BP 103/46 10/31/22  1235  Temp 98.5 10/31/22 1233  Pulse 81 10/31/22 1233  Resp 12 10/31/22 1233  SpO2 100 % 10/31/22 1233  Vitals shown include unvalidated device data.  Last Pain:  Vitals:   10/31/22 1137  TempSrc:   PainSc: 3          Complications: No notable events documented.

## 2022-10-31 NOTE — Telephone Encounter (Signed)
Error

## 2022-10-31 NOTE — Anesthesia Procedure Notes (Signed)
Date/Time: 10/31/2022 11:39 AM  Performed by: Julian Reil, CRNAPre-anesthesia Checklist: Patient identified, Emergency Drugs available, Suction available and Patient being monitored Patient Re-evaluated:Patient Re-evaluated prior to induction Oxygen Delivery Method: Nasal cannula Induction Type: IV induction Placement Confirmation: positive ETCO2

## 2022-10-31 NOTE — Progress Notes (Signed)
Patient upgraded to soft diet per physician.

## 2022-10-31 NOTE — Progress Notes (Signed)
PROGRESS NOTE     Nicolas [name], is a 47 y.o. male, DOB - Jan 16, 1976, GNF:[id number]  Admit date - 10/30/2022   Admitting Physician Evelean Bigler Mariea Clonts, MD  Outpatient Primary MD for the patient is [name], MD  LOS - 0  Chief Complaint  Patient presents with   Rectal Bleeding      Brief Narrative:  47 y.o. male with medical history significant of HTN, HLD, carotid artery stenosis, hx of PUD, GERD, psoriasis admitted with bright red blood per rectum on 10/30/2022 -Stool GI pathogen revealed norovirus -EGD and colonoscopy findings on 10/31/2022 noted    -Assessment and Plan: 1)Acute GI bleeding/Bright Red blood per Rectum -hgb with large drop 14.6>17.5 (5/14)>10.9>9.0>.8.1 -CTA abdo/pelvis with no findings of active bleed. Does have internal hemorrhoid -CT abdomen/pelvis on 5/14 suggestive of duodenitis and has been taking flagyl/clarithromycin x 7 days  --EGD and colonoscopy on 10/31/2022 noted -If recurrent clinical bleeding or further drop in Hb, will need to proceed with capsule endoscopy.  -Advance diet  2)Norovirus Diarrhea -C. difficile negative -Symptomatic and supportive treatment  3)HTN--- Norvasc on hold  4)HLD--Continue crestor 20mg  daily   5)Stenosis of carotid artery Followed by cardiology -imaging 03/2022: 1. Mild (1-49%) stenosis proximal right internal carotid artery secondary to heterogenous atherosclerotic plaque. 2. Moderate (50-69%) stenosis proximal left internal carotid artery secondary to heterogenous atherosclerotic plaque. Hold ASA in setting of GI bleed Continue statin   6)DM2-A1c 6.8 reflecting good diabetic control -Low calorie diet, portion control and increase physical activity discussed with patient -Body mass index is 35.22 kg/m. -Follow-up with PCP with for management of diabetes    Plaque psoriasis Started on Skirizi this month and had his first injection on 10/19/22.  Rule out IBD as can see psoriasis with IBD   GERD  (gastroesophageal reflux disease)/duodenitis Continue IV Protonix   Status is: Inpatient   Disposition: The patient is from: Home              Anticipated d/c is to: Home              Anticipated d/c date is: 1 day              Patient currently is not medically stable to d/c. Barriers: Not Clinically Stable-   Code Status :  -  Code Status: Full Code   Family Communication:   (patient is alert, awake and coherent)  Discussed with wife at bedside  DVT Prophylaxis  :   - SCDs  SCDs Start: 10/30/22 1624   Lab Results  Component Value Date   PLT 294 10/31/2022    Inpatient Medications  Scheduled Meds:  Chlorhexidine Gluconate Cloth  6 each Topical Q0600   pantoprazole (PROTONIX) IV  40 mg Intravenous Q12H   rosuvastatin  20 mg Oral Daily   Continuous Infusions:  sodium chloride     PRN Meds:.acetaminophen **OR** acetaminophen, morphine injection, ondansetron **OR** ondansetron (ZOFRAN) IV   Anti-infectives (From admission, onward)    Start     Dose/Rate Route Frequency Ordered Stop   10/30/22 2200  clarithromycin (BIAXIN) tablet 500 mg  Status:  Discontinued        500 mg Oral 2 times daily 10/30/22 1829 10/30/22 1845   10/30/22 2200  metroNIDAZOLE (FLAGYL) tablet 500 mg  Status:  Discontinued        500 mg Oral 2 times daily 10/30/22 1829 10/30/22 1845         Subjective: Nicolas [name] today has no fevers, no  emesis,  No chest pain,   - Diarrhea improving --wife at bedside questions answered -Patient wants his diet advanced   Objective: Vitals:   10/31/22 1130 10/31/22 1233 10/31/22 1245 10/31/22 1631  BP: 118/63 (!) 103/46 (!) 134/98   Pulse: 86 79 77   Resp: 10 14 13    Temp:  98.5 F (36.9 C)  98 F (36.7 C)  TempSrc:    Oral  SpO2: 100% 100% 100%   Weight:      Height:        Intake/Output Summary (Last 24 hours) at 10/31/2022 1856 Last data filed at 10/31/2022 1224 Gross per 24 hour  Intake 600 ml  Output --  Net 600 ml   Filed Weights    10/30/22 1823 10/31/22 0523  Weight: 127.6 kg 127.8 kg    Physical Exam  Gen:- Awake Alert,  in no apparent distress  HEENT:- [place].AT, No sclera icterus Neck-Supple Neck,No JVD,.  Lungs-  CTAB , fair symmetrical air movement CV- S1, S2 normal, regular  Abd-  +ve B.Sounds, Abd Soft, periumbilical and epigastric area discomfort to palpation no rebound or guarding Extremity/Skin:- No  edema, pedal pulses present  Psych-affect is appropriate, oriented x3 Neuro-no new focal deficits, no tremors  Data Reviewed: I have personally reviewed following labs and imaging studies  CBC: Recent Labs  Lab 10/30/22 1058 10/30/22 1444 10/30/22 1626 10/30/22 2143 10/31/22 0457  WBC 11.4*  --  11.4* 10.3 8.8  HGB 10.9* 9.0* 9.1* 8.7* 8.1*  HCT 31.2* 26.1* 26.2* 25.2* 23.4*  MCV 86.9  --  86.8 87.5 88.3  PLT 354  --  308 317 294   Basic Metabolic Panel: Recent Labs  Lab 10/30/22 1058 10/31/22 0457  NA 136 134*  K 4.5 3.7  CL 105 103  CO2 25 27  GLUCOSE 178* 141*  BUN 33* 21*  CREATININE 0.80 0.82  CALCIUM 8.0* 7.8*   GFR: Estimated Creatinine Clearance: 163.8 mL/min (by C-G formula based on SCr of 0.82 mg/dL). Liver Function Tests: Recent Labs  Lab 10/30/22 1058 10/31/22 0457  AST 39 30  ALT 77* 59*  ALKPHOS 59 40  BILITOT 0.4 0.7  PROT 5.3* 4.7*  ALBUMIN 3.0* 2.6*   HbA1C: Recent Labs    10/30/22 1058  HGBA1C 6.8*   Recent Results (from the past 240 hour(s))  MRSA Next Gen by PCR, Nasal     Status: None   Collection Time: 10/30/22  6:15 PM   Specimen: Nasal Mucosa; Nasal Swab  Result Value Ref Range Status   MRSA by PCR Next Gen NOT DETECTED NOT DETECTED Final    Comment: (NOTE) The GeneXpert MRSA Assay (FDA approved for NASAL specimens only), is one component of a comprehensive MRSA colonization surveillance program. It is not intended to diagnose MRSA infection nor to guide or monitor treatment for MRSA infections. Test performance is not FDA approved in  patients less than 62 years old. Performed at [facility], [address], [place], [place] [address]   Gastrointestinal Panel by PCR , Stool     Status: Abnormal   Collection Time: 10/30/22 11:00 PM  Result Value Ref Range Status   Campylobacter species NOT DETECTED NOT DETECTED Final   Plesimonas shigelloides NOT DETECTED NOT DETECTED Final   Salmonella species NOT DETECTED NOT DETECTED Final   Yersinia enterocolitica NOT DETECTED NOT DETECTED Final   Vibrio species NOT DETECTED NOT DETECTED Final   Vibrio cholerae NOT DETECTED NOT DETECTED Final   Enteroaggregative E coli (  EAEC) NOT DETECTED NOT DETECTED Final   Enteropathogenic E coli (EPEC) NOT DETECTED NOT DETECTED Final   Enterotoxigenic E coli (ETEC) NOT DETECTED NOT DETECTED Final   Shiga like toxin producing E coli (STEC) NOT DETECTED NOT DETECTED Final   Shigella/Enteroinvasive E coli (EIEC) NOT DETECTED NOT DETECTED Final   Cryptosporidium NOT DETECTED NOT DETECTED Final   Cyclospora cayetanensis NOT DETECTED NOT DETECTED Final   Entamoeba histolytica NOT DETECTED NOT DETECTED Final   Giardia lamblia NOT DETECTED NOT DETECTED Final   Adenovirus F40/41 NOT DETECTED NOT DETECTED Final   Astrovirus NOT DETECTED NOT DETECTED Final   Norovirus GI/GII DETECTED (A) NOT DETECTED Final    Comment: RESULT CALLED TO, READ BACK BY AND VERIFIED WITH: JADA MUSE RN 1403 10/31/22 HNM    Rotavirus A NOT DETECTED NOT DETECTED Final   Sapovirus (I, II, IV, and V) NOT DETECTED NOT DETECTED Final    Comment: Performed at [facility], [address] [place], [place] [address]  C Difficile Quick Screen w PCR reflex     Status: None   Collection Time: 10/30/22 11:00 PM   Specimen: STOOL  Result Value Ref Range Status   C Diff antigen NEGATIVE NEGATIVE Final   C Diff toxin NEGATIVE NEGATIVE Final   C Diff interpretation No C. difficile detected.  Final    Comment: Performed at [facility], [address] [place], [place] [address]      Radiology Studies: CT ANGIO ABDOMEN PELVIS  W & WO CONTRAST  Result Date: 10/30/2022 CLINICAL DATA:  Bright red blood per rectum EXAM: CTA ABDOMEN AND PELVIS WITHOUT AND WITH CONTRAST TECHNIQUE: Multidetector CT imaging of the abdomen and pelvis was performed using the standard protocol during bolus administration of intravenous contrast. Multiplanar reconstructed images and MIPs were obtained and reviewed to evaluate the vascular anatomy. RADIATION DOSE REDUCTION: This exam was performed according to the departmental dose-optimization program which includes automated exposure control, adjustment of the mA and/or kV according to patient size and/or use of iterative reconstruction technique. CONTRAST:  OMNIPAQUE IOHEXOL 350 MG/ML SOLN COMPARISON:  10/24/2022, 02/09/2014 FINDINGS: VASCULAR Aorta: Normal caliber aorta without aneurysm, dissection, vasculitis or significant stenosis. Mild calcified and noncalcified atherosclerotic plaque. Celiac: Patent without evidence of aneurysm, dissection, vasculitis or significant stenosis. SMA: Patent without evidence of aneurysm, dissection, vasculitis or significant stenosis. Common hepatic artery arises from the SMA, an anatomic variant. Renals: Both renal arteries are patent without evidence of aneurysm, dissection, vasculitis, fibromuscular dysplasia or significant stenosis. IMA: Patent. Inflow: Patent without evidence of aneurysm, dissection, vasculitis or significant stenosis. Proximal Outflow: Bilateral common femoral and visualized portions of the superficial and profunda femoral arteries are patent without evidence of aneurysm, dissection, vasculitis or significant stenosis. Veins: Major venous structures are patent by portal venous phase imaging. Review of the MIP images confirms the above findings. NON-VASCULAR Lower chest: Included lung bases are clear.  Heart size is normal. Hepatobiliary: No focal liver abnormality is seen. No gallstones,  gallbladder wall thickening, or biliary dilatation. Pancreas: Unremarkable. No pancreatic ductal dilatation or surrounding inflammatory changes. Spleen: Normal in size without focal abnormality. Adrenals/Urinary Tract: Unchanged appearance of the adrenal glands with coarse calcifications present bilaterally. Simple left renal cyst which does not require follow-up imaging. Kidneys enhance symmetrically. No renal stone or hydronephrosis. Urinary bladder within normal limits. Stomach/Bowel: Stomach within normal limits. No dilated loops of bowel. Normal appendix in the right lower quadrant (series 15, image 71) no focal bowel wall thickening or inflammatory  changes. Small focus of hyperenhancement along the right wall of the anus (series 9, image 24), likely representing internal hemorrhoid. Elsewhere, no abnormal contrast accumulation within the bowel. Lymphatic: Mildly enlarged lymph node in the porta hepatis measuring 1.4 cm in short axis (series 15, image 30), unchanged since at least 2015 and considered benign. No new or enlarging abdominopelvic lymph nodes. Reproductive: Prostate is unremarkable. Other: No free fluid. No abdominopelvic fluid collection. No pneumoperitoneum. No abdominal wall hernia. Musculoskeletal: No acute or significant osseous findings. IMPRESSION: 1. Small focus of hyperenhancement along the right wall of the anus, likely representing internal hemorrhoid. Elsewhere, no findings of active GI bleed. 2. No acute abdominopelvic findings. Negative for abdominal aortic aneurysm or dissection. Electronically Signed   By: Duanne Guess D.O.   On: 10/30/2022 12:30     Scheduled Meds:  Chlorhexidine Gluconate Cloth  6 each Topical Q0600   pantoprazole (PROTONIX) IV  40 mg Intravenous Q12H   rosuvastatin  20 mg Oral Daily   Continuous Infusions:  sodium chloride       LOS: 0 days    Shon Hale M.D on 10/31/2022 at 6:56 PM  Go to [email or web address] - for contact info  [facility] - Office  [phone]  If 7PM-7AM, please contact night-coverage [email or web address] 10/31/2022, 6:56 PM

## 2022-10-31 NOTE — Progress Notes (Signed)
We will proceed with colonoscopy and possible EGD as scheduled.  I thoroughly discussed with the patient the procedure, including the risks involved. Patient understands what the procedure involves including the benefits and any risks. Patient understands alternatives to the proposed procedure. Risks including (but not limited to) bleeding, tearing of the lining (perforation), rupture of adjacent organs, problems with heart and lung function, infection, and medication reactions. A small percentage of complications may require surgery, hospitalization, repeat endoscopic procedure, and/or transfusion.  Patient understood and agreed.  Javious Hallisey Castaneda, MD Gastroenterology and Hepatology [facility]  

## 2022-10-31 NOTE — Progress Notes (Addendum)
  Transition of Care [facility]) Screening Note   Patient Details  Name: DUC REISCHMAN Date of Birth: 05-Dec-1975   Transition of [facility]) CM/SW Contact:    Villa Herb, LCSWA Phone Number: 10/31/2022, 10:03 AM    Transition of Care Department [facility]) has reviewed patient and no TOC needs have been identified at this time. We will continue to monitor patient advancement through interdisciplinary progression rounds. If new patient transition needs arise, please place a TOC consult.

## 2022-11-01 ENCOUNTER — Telehealth: Payer: Self-pay | Admitting: Family Medicine

## 2022-11-01 ENCOUNTER — Other Ambulatory Visit: Payer: Self-pay | Admitting: Allergy

## 2022-11-01 DIAGNOSIS — K219 Gastro-esophageal reflux disease without esophagitis: Secondary | ICD-10-CM

## 2022-11-01 DIAGNOSIS — E785 Hyperlipidemia, unspecified: Secondary | ICD-10-CM | POA: Diagnosis not present

## 2022-11-01 DIAGNOSIS — D62 Acute posthemorrhagic anemia: Secondary | ICD-10-CM

## 2022-11-01 DIAGNOSIS — I10 Essential (primary) hypertension: Secondary | ICD-10-CM

## 2022-11-01 DIAGNOSIS — K922 Gastrointestinal hemorrhage, unspecified: Secondary | ICD-10-CM | POA: Diagnosis not present

## 2022-11-01 LAB — PREPARE RBC (CROSSMATCH)

## 2022-11-01 LAB — HEMOGLOBIN AND HEMATOCRIT, BLOOD
HCT: 23.5 % — ABNORMAL LOW (ref 39.0–52.0)
Hemoglobin: 8.1 g/dL — ABNORMAL LOW (ref 13.0–17.0)

## 2022-11-01 LAB — BASIC METABOLIC PANEL
Anion gap: 5 (ref 5–15)
BUN: 19 mg/dL (ref 6–20)
CO2: 26 mmol/L (ref 22–32)
Calcium: 7.9 mg/dL — ABNORMAL LOW (ref 8.9–10.3)
Chloride: 105 mmol/L (ref 98–111)
Creatinine, Ser: 0.88 mg/dL (ref 0.61–1.24)
GFR, Estimated: >60 mL/min (ref >60)
Glucose, Bld: 122 mg/dL — ABNORMAL HIGH (ref 70–99)
Potassium: 4 mmol/L (ref 3.5–5.1)
Sodium: 136 mmol/L (ref 135–145)

## 2022-11-01 LAB — TYPE AND SCREEN
Antibody Screen: NEGATIVE
Unit division: 0

## 2022-11-01 LAB — CBC
HCT: 21.5 % — ABNORMAL LOW (ref 39.0–52.0)
Hemoglobin: 7.3 g/dL — ABNORMAL LOW (ref 13.0–17.0)
MCH: 30.5 pg (ref 26.0–34.0)
MCHC: 34 g/dL (ref 30.0–36.0)
MCV: 90 fL (ref 80.0–100.0)
Platelets: 285 10*3/uL (ref 150–400)
RBC: 2.39 MIL/uL — ABNORMAL LOW (ref 4.22–5.81)
RDW: 15.2 % (ref 11.5–15.5)
WBC: 12.5 10*3/uL — ABNORMAL HIGH (ref 4.0–10.5)
nRBC: 0 % (ref 0.0–0.2)

## 2022-11-01 LAB — BPAM RBC: Blood Product Expiration Date: 202406212359

## 2022-11-01 LAB — SURGICAL PATHOLOGY

## 2022-11-01 MED ORDER — SODIUM CHLORIDE 0.9% IV SOLUTION: Freq: Once | INTRAVENOUS | Status: AC

## 2022-11-01 MED ORDER — MORPHINE SULFATE (PF) 2 MG/ML IV SOLN: 1.0000 mg | INTRAVENOUS | Status: DC | PRN

## 2022-11-01 NOTE — Progress Notes (Addendum)
   11/01/22 0124  Assess: MEWS Score  Temp (!) 101.6 F (38.7 C)  BP (!) 104/48  MAP (mmHg) (!) 62  Pulse Rate 100  Resp 20  Level of Consciousness Alert  SpO2 100 %  O2 Device Room Air  Assess: MEWS Score  MEWS Temp 2  MEWS Systolic 0  MEWS Pulse 0  MEWS RR 0  MEWS LOC 0  MEWS Score 2  MEWS Score Color Yellow  Assess: if the MEWS score is Yellow or Red  Were vital signs taken at a resting state? Yes  Focused Assessment No change from prior assessment  Does the patient meet 2 or more of the SIRS criteria? No  MEWS guidelines implemented  No, previously yellow, continue vital signs every 4 hours  Notify: Charge Nurse/RN  Name of Charge Nurse/RN Notified [place], RN  Provider Notification  Provider Name/Title Thomes Dinning, MD  Date Provider Notified 11/01/22  Time Provider Notified 0158  Method of Notification  (secure chat)  Notification Reason  (elevated temp, pt already yellow)  Provider response No new orders  Date of Provider Response 11/01/22  Time of Provider Response 0159  Assess: SIRS CRITERIA  SIRS Temperature  1  SIRS Pulse 1  SIRS Respirations  0  SIRS WBC 0  SIRS Score Sum  2   Pt arrived to floor as a yellow mews due to elevated pulse rate. Around 0120, pt complained of chills and said he felt like he had a fever. Vitals checked, temp noted to be elevated. Pt remains yellow mews. PRN Tylenol administered, MD made aware. Plan of care ongoing.

## 2022-11-01 NOTE — Progress Notes (Addendum)
Subjective: Feeling some better today. Notes some abdominal soreness. No BMs this morning. Denies rectal bleeding today no nausea or vomiting. Ate breakfast this morning without issue.   Objective: Vital signs in last 24 hours: Temp:  [98 F (36.7 C)-101.6 F (38.7 C)] 98.3 F (36.8 C) (05/22 0502) Pulse Rate:  [77-120] 101 (05/22 0848) Resp:  [10-20] 19 (05/22 0848) BP: (103-162)/(46-98) 112/53 (05/22 0848) SpO2:  [94 %-100 %] 94 % (05/22 0848) Weight:  [128.5 kg] 128.5 kg (05/21 2319) Last BM Date : 10/31/22 General:   Alert and oriented, pleasant Head:  Normocephalic and atraumatic. Eyes:  No icterus, sclera clear. Conjuctiva pink.  Mouth:  Without lesions, mucosa pink and moist.  Neck:  Supple, without thyromegaly or masses.  Heart:  S1, S2 present, no murmurs noted.  Lungs: Clear to auscultation bilaterally, without wheezing, rales, or rhonchi.  Abdomen:  Bowel sounds present, soft, generalized "soreness" to abdomen upon palpation, non-distended. No HSM or hernias noted. No rebound or guarding. No masses appreciated  Msk:  Symmetrical without gross deformities. Normal posture. Pulses:  Normal pulses noted. Extremities:  Without clubbing or edema. Neurologic:  Alert and  oriented x4;  grossly normal neurologically. Skin:  Warm and dry, intact without significant lesions.  Psych:  Alert and cooperative. Normal mood and affect.  Intake/Output from previous day: 05/21 0701 - 05/22 0700 In: 600 [I.V.:600] Out: -  Intake/Output this shift: No intake/output data recorded.  Lab Results: Recent Labs    10/30/22 2143 10/31/22 0457 11/01/22 0434  WBC 10.3 8.8 12.5*  HGB 8.7* 8.1* 7.3*  HCT 25.2* 23.4* 21.5*  PLT 317 294 285   BMET Recent Labs    10/30/22 1058 10/31/22 0457 11/01/22 0434  NA 136 134* 136  K 4.5 3.7 4.0  CL 105 103 105  CO2 25 27 26   GLUCOSE 178* 141* 122*  BUN 33* 21* 19  CREATININE 0.80 0.82 0.88  CALCIUM 8.0* 7.8* 7.9*   LFT Recent Labs     10/30/22 1058 10/31/22 0457  PROT 5.3* 4.7*  ALBUMIN 3.0* 2.6*  AST 39 30  ALT 77* 59*  ALKPHOS 59 40  BILITOT 0.4 0.7    Studies/Results: CT ANGIO ABDOMEN PELVIS  W & WO CONTRAST  Result Date: 10/30/2022 CLINICAL DATA:  Bright red blood per rectum EXAM: CTA ABDOMEN AND PELVIS WITHOUT AND WITH CONTRAST TECHNIQUE: Multidetector CT imaging of the abdomen and pelvis was performed using the standard protocol during bolus administration of intravenous contrast. Multiplanar reconstructed images and MIPs were obtained and reviewed to evaluate the vascular anatomy. RADIATION DOSE REDUCTION: This exam was performed according to the departmental dose-optimization program which includes automated exposure control, adjustment of the mA and/or kV according to patient size and/or use of iterative reconstruction technique. CONTRAST:  OMNIPAQUE IOHEXOL 350 MG/ML SOLN COMPARISON:  10/24/2022, 02/09/2014 FINDINGS: VASCULAR Aorta: Normal caliber aorta without aneurysm, dissection, vasculitis or significant stenosis. Mild calcified and noncalcified atherosclerotic plaque. Celiac: Patent without evidence of aneurysm, dissection, vasculitis or significant stenosis. SMA: Patent without evidence of aneurysm, dissection, vasculitis or significant stenosis. Common hepatic artery arises from the SMA, an anatomic variant. Renals: Both renal arteries are patent without evidence of aneurysm, dissection, vasculitis, fibromuscular dysplasia or significant stenosis. IMA: Patent. Inflow: Patent without evidence of aneurysm, dissection, vasculitis or significant stenosis. Proximal Outflow: Bilateral common femoral and visualized portions of the superficial and profunda femoral arteries are patent without evidence of aneurysm, dissection, vasculitis or significant stenosis. Veins: Major venous structures are patent  by portal venous phase imaging. Review of the MIP images confirms the above findings. NON-VASCULAR Lower chest:  Included lung bases are clear.  Heart size is normal. Hepatobiliary: No focal liver abnormality is seen. No gallstones, gallbladder wall thickening, or biliary dilatation. Pancreas: Unremarkable. No pancreatic ductal dilatation or surrounding inflammatory changes. Spleen: Normal in size without focal abnormality. Adrenals/Urinary Tract: Unchanged appearance of the adrenal glands with coarse calcifications present bilaterally. Simple left renal cyst which does not require follow-up imaging. Kidneys enhance symmetrically. No renal stone or hydronephrosis. Urinary bladder within normal limits. Stomach/Bowel: Stomach within normal limits. No dilated loops of bowel. Normal appendix in the right lower quadrant (series 15, image 71) no focal bowel wall thickening or inflammatory changes. Small focus of hyperenhancement along the right wall of the anus (series 9, image 24), likely representing internal hemorrhoid. Elsewhere, no abnormal contrast accumulation within the bowel. Lymphatic: Mildly enlarged lymph node in the porta hepatis measuring 1.4 cm in short axis (series 15, image 30), unchanged since at least 2015 and considered benign. No new or enlarging abdominopelvic lymph nodes. Reproductive: Prostate is unremarkable. Other: No free fluid. No abdominopelvic fluid collection. No pneumoperitoneum. No abdominal wall hernia. Musculoskeletal: No acute or significant osseous findings. IMPRESSION: 1. Small focus of hyperenhancement along the right wall of the anus, likely representing internal hemorrhoid. Elsewhere, no findings of active GI bleed. 2. No acute abdominopelvic findings. Negative for abdominal aortic aneurysm or dissection. Electronically Signed   By: Duanne Guess D.O.   On: 10/30/2022 12:30    Assessment: Nicolas [name] is a 47 year old male with history of CAD, PUD in 2015 without follow-up EGD for surveillance, HTN, migraines, kidney stones, presenting to the ED Monday after acute onset of  abdominal cramping and bloody stools. GI consulted for rectal bleeding/anemia.  Rectal bleeding and blood loss anemia:  Reported having diarrhea and rectal bleeding after eating [employer] chicken, starting with abdominal cramping.  CT without active bleeding on Monday.  Recent CT of abdomen last week suggestive of duodenitis.  Patient underwent colonoscopy and EGD yesterday with findings of a polyp in the colon, gastric stenosis, duodenitis, nonbleeding duodenal ulcer with clean ulcer base.  Biopsies pending.  Recommend PPI twice daily and avoid NSAID use.  Hemoglobin on presentation 10.9, however has continued to slowly decline throughout admission with hemoglobin of 7.3 this morning, likely some aspect of equilibration/hemodilution, rectal bleeding has subsided. Recommend transfusing 1 unit PRBCs today given his hemoglobin level. If patient continues to have further decline in hemoglobin thereafter, will need to undergo further evaluation with givens capsule study.   Plan: Continue PPI BID Recommend transfusing 1 unit PRBCs today Trend h&h Avoid all NSAIDs Consider capsule study if bleeding persists or hemoglobin continues to decline further   LOS: 1 day    11/01/2022, 9:33 AM   Khris Jansson L. Jeanmarie Hubert, MSN, APRN, AGNP-C Adult-Gerontology Nurse Practitioner [facility] at [facility]  **addendum, discussed pathology results from EGD and Colonoscopy with the patient per Dr. Wilburt Finlay request. Patient verbalized understanding, will plan to repeat Colonoscopy in 7 years due to presence of TA.

## 2022-11-01 NOTE — Progress Notes (Signed)
PROGRESS NOTE     Nicolas [name], is a 47 y.o. male, DOB - [birth year], ZDG:[id number]  Admit date - 10/30/2022   Admitting Physician Courage Mariea Clonts, MD  Outpatient Primary MD for the patient is [name], MD  LOS - 1  Chief Complaint  Patient presents with   Rectal Bleeding      Brief Narrative:  47 y.o. male with medical history significant of HTN, HLD, carotid artery stenosis, hx of PUD, GERD, psoriasis admitted with bright red blood per rectum on 10/30/2022 -Stool GI pathogen revealed norovirus -EGD and colonoscopy findings on 10/31/2022 noted below    -Assessment and Plan: 1)Acute GI bleeding/Bright Red blood per Rectum -hgb with large drop 14.6>17.5 (5/14)>10.9>9.0>.8.1>7.3 -CTA abdo/pelvis with no findings of active bleed. Does have internal hemorrhoid -CT abdomen/pelvis on 5/14 suggestive of duodenitis and has been taking flagyl/clarithromycin x 7 days  --EGD and colonoscopy on 10/31/2022 noted -If recurrent clinical bleeding or further drop in Hb, GI will likely proceed with capsule endoscopy.  -Advance diet -Hg down to 7.3, per GI recs, transfusing 1 unit PRBC  -recheck H/H after transfusion and again in AM   2)Norovirus Diarrhea -C. difficile negative -Symptomatic and supportive treatment  3)HTN--- Norvasc on hold  4)HLD--Continue crestor 20mg  daily   5)Stenosis of carotid artery Followed by cardiology -imaging 03/2022: 1. Mild (1-49%) stenosis proximal right internal carotid artery secondary to heterogenous atherosclerotic plaque. 2. Moderate (50-69%) stenosis proximal left internal carotid artery secondary to heterogenous atherosclerotic plaque. Hold ASA in setting of GI bleed Continue statin   6)DM2-A1c 6.8 reflecting good diabetic control -Low calorie diet, portion control and increase physical activity discussed with patient -Body mass index is 35.41 kg/m. -Follow-up with PCP with for management of diabetes  Plaque psoriasis Started on Skirizi  this month and had his first injection on 10/19/22.  Rule out IBD as can see psoriasis with IBD - biopsies pending from colonoscopy  GERD (gastroesophageal reflux disease)/duodenitis Continue IV Protonix   Status is: Inpatient   Disposition: The patient is from: Home              Anticipated d/c is to: Home              Anticipated d/c date is: 1 day              Patient currently is not medically stable to d/c. Barriers: Not Clinically Stable-   Code Status :  -  Code Status: Full Code   Family Communication:   (patient is alert, awake and coherent)  Discussed with wife at bedside  DVT Prophylaxis  :   - SCDs  SCDs Start: 10/30/22 1624   Lab Results  Component Value Date   PLT 285 11/01/2022    Inpatient Medications  Scheduled Meds:  sodium chloride   Intravenous Once   pantoprazole (PROTONIX) IV  40 mg Intravenous Q12H   rosuvastatin  20 mg Oral Daily   Continuous Infusions:  sodium chloride 50 mL/hr at 11/01/22 0153   PRN Meds:.acetaminophen **OR** acetaminophen, morphine injection, ondansetron **OR** ondansetron (ZOFRAN) IV   Anti-infectives (From admission, onward)    Start     Dose/Rate Route Frequency Ordered Stop   10/30/22 2200  clarithromycin (BIAXIN) tablet 500 mg  Status:  Discontinued        500 mg Oral 2 times daily 10/30/22 1829 10/30/22 1845   10/30/22 2200  metroNIDAZOLE (FLAGYL) tablet 500 mg  Status:  Discontinued        500  mg Oral 2 times daily 10/30/22 1829 10/30/22 1845         Subjective: [name] having mild lower abdominal pain after procedures, had a fever early this morning to 101.6, no BMs since yesterday.    Objective: Vitals:   11/01/22 0124 11/01/22 0502 11/01/22 0848 11/01/22 0955  BP: (!) 104/48 (!) 105/57 (!) 112/53 118/62  Pulse: 100 91 (!) 101 97  Resp: 20 20 19 16   Temp: (!) 101.6 F (38.7 C) 98.3 F (36.8 C)  99 F (37.2 C)  TempSrc: Oral Oral  Oral  SpO2: 100% 100% 94% 99%  Weight:      Height:         Intake/Output Summary (Last 24 hours) at 11/01/2022 1135 Last data filed at 10/31/2022 1224 Gross per 24 hour  Intake 600 ml  Output --  Net 600 ml   Filed Weights   10/30/22 1823 10/31/22 0523 10/31/22 2319  Weight: 127.6 kg 127.8 kg 128.5 kg    Physical Exam  Physical Exam Constitutional:      General: He is not in acute distress.    Appearance: Normal appearance. He is not ill-appearing, toxic-appearing or diaphoretic.  HENT:     Head: Normocephalic and atraumatic.     Nose: Nose normal.     Mouth/Throat:     Mouth: Mucous membranes are moist.  Eyes:     Extraocular Movements: Extraocular movements intact.     Pupils: Pupils are equal, round, and reactive to light.  Cardiovascular:     Rate and Rhythm: Normal rate.  Pulmonary:     Effort: Pulmonary effort is normal.  Abdominal:     General: Abdomen is flat. Bowel sounds are normal. There is no distension.     Palpations: Abdomen is soft. There is no mass.  Musculoskeletal:        General: Normal range of motion.  Skin:    General: Skin is warm and dry.  Neurological:     General: No focal deficit present.     Mental Status: He is alert.  Psychiatric:        Mood and Affect: Mood normal.      Data Reviewed: I have personally reviewed following labs and imaging studies  CBC: Recent Labs  Lab 10/30/22 1058 10/30/22 1444 10/30/22 1626 10/30/22 2143 10/31/22 0457 11/01/22 0434  WBC 11.4*  --  11.4* 10.3 8.8 12.5*  HGB 10.9* 9.0* 9.1* 8.7* 8.1* 7.3*  HCT 31.2* 26.1* 26.2* 25.2* 23.4* 21.5*  MCV 86.9  --  86.8 87.5 88.3 90.0  PLT 354  --  308 317 294 285   Basic Metabolic Panel: Recent Labs  Lab 10/30/22 1058 10/31/22 0457 11/01/22 0434  NA 136 134* 136  K 4.5 3.7 4.0  CL 105 103 105  CO2 25 27 26   GLUCOSE 178* 141* 122*  BUN 33* 21* 19  CREATININE 0.80 0.82 0.88  CALCIUM 8.0* 7.8* 7.9*   GFR: Estimated Creatinine Clearance: 153.1 mL/min (by C-G formula based on SCr of 0.88 mg/dL). Liver  Function Tests: Recent Labs  Lab 10/30/22 1058 10/31/22 0457  AST 39 30  ALT 77* 59*  ALKPHOS 59 40  BILITOT 0.4 0.7  PROT 5.3* 4.7*  ALBUMIN 3.0* 2.6*   HbA1C: Recent Labs    10/30/22 1058  HGBA1C 6.8*   Recent Results (from the past 240 hour(s))  MRSA Next Gen by PCR, Nasal     Status: None   Collection Time: 10/30/22  6:15 PM  Specimen: Nasal Mucosa; Nasal Swab  Result Value Ref Range Status   MRSA by PCR Next Gen NOT DETECTED NOT DETECTED Final    Comment: (NOTE) The GeneXpert MRSA Assay (FDA approved for NASAL specimens only), is one component of a comprehensive MRSA colonization surveillance program. It is not intended to diagnose MRSA infection nor to guide or monitor treatment for MRSA infections. Test performance is not FDA approved in patients less than 19 years old. Performed at [facility], [address], [place], [place] [address]   Gastrointestinal Panel by PCR , Stool     Status: Abnormal   Collection Time: 10/30/22 11:00 PM  Result Value Ref Range Status   Campylobacter species NOT DETECTED NOT DETECTED Final   Plesimonas shigelloides NOT DETECTED NOT DETECTED Final   Salmonella species NOT DETECTED NOT DETECTED Final   Yersinia enterocolitica NOT DETECTED NOT DETECTED Final   Vibrio species NOT DETECTED NOT DETECTED Final   Vibrio cholerae NOT DETECTED NOT DETECTED Final   Enteroaggregative E coli (EAEC) NOT DETECTED NOT DETECTED Final   Enteropathogenic E coli (EPEC) NOT DETECTED NOT DETECTED Final   Enterotoxigenic E coli (ETEC) NOT DETECTED NOT DETECTED Final   Shiga like toxin producing E coli (STEC) NOT DETECTED NOT DETECTED Final   Shigella/Enteroinvasive E coli (EIEC) NOT DETECTED NOT DETECTED Final   Cryptosporidium NOT DETECTED NOT DETECTED Final   Cyclospora cayetanensis NOT DETECTED NOT DETECTED Final   Entamoeba histolytica NOT DETECTED NOT DETECTED Final   Giardia lamblia NOT DETECTED NOT DETECTED Final   Adenovirus F40/41 NOT DETECTED  NOT DETECTED Final   Astrovirus NOT DETECTED NOT DETECTED Final   Norovirus GI/GII DETECTED (A) NOT DETECTED Final    Comment: RESULT CALLED TO, READ BACK BY AND VERIFIED WITH: JADA MUSE RN 1403 10/31/22 HNM    Rotavirus A NOT DETECTED NOT DETECTED Final   Sapovirus (I, II, IV, and V) NOT DETECTED NOT DETECTED Final    Comment: Performed at [facility], [address] [place], [place] [address]  C Difficile Quick Screen w PCR reflex     Status: None   Collection Time: 10/30/22 11:00 PM   Specimen: STOOL  Result Value Ref Range Status   C Diff antigen NEGATIVE NEGATIVE Final   C Diff toxin NEGATIVE NEGATIVE Final   C Diff interpretation No C. difficile detected.  Final    Comment: Performed at [facility], [address], [place], [place] [address]     Radiology Studies: CT ANGIO ABDOMEN PELVIS  W & WO CONTRAST  Result Date: 10/30/2022 CLINICAL DATA:  Bright red blood per rectum EXAM: CTA ABDOMEN AND PELVIS WITHOUT AND WITH CONTRAST TECHNIQUE: Multidetector CT imaging of the abdomen and pelvis was performed using the standard protocol during bolus administration of intravenous contrast. Multiplanar reconstructed images and MIPs were obtained and reviewed to evaluate the vascular anatomy. RADIATION DOSE REDUCTION: This exam was performed according to the departmental dose-optimization program which includes automated exposure control, adjustment of the mA and/or kV according to patient size and/or use of iterative reconstruction technique. CONTRAST:  OMNIPAQUE IOHEXOL 350 MG/ML SOLN COMPARISON:  10/24/2022, 02/09/2014 FINDINGS: VASCULAR Aorta: Normal caliber aorta without aneurysm, dissection, vasculitis or significant stenosis. Mild calcified and noncalcified atherosclerotic plaque. Celiac: Patent without evidence of aneurysm, dissection, vasculitis or significant stenosis. SMA: Patent without evidence of aneurysm, dissection, vasculitis or significant stenosis. Common hepatic  artery arises from the SMA, an anatomic variant. Renals: Both renal arteries are patent without evidence of aneurysm,  dissection, vasculitis, fibromuscular dysplasia or significant stenosis. IMA: Patent. Inflow: Patent without evidence of aneurysm, dissection, vasculitis or significant stenosis. Proximal Outflow: Bilateral common femoral and visualized portions of the superficial and profunda femoral arteries are patent without evidence of aneurysm, dissection, vasculitis or significant stenosis. Veins: Major venous structures are patent by portal venous phase imaging. Review of the MIP images confirms the above findings. NON-VASCULAR Lower chest: Included lung bases are clear.  Heart size is normal. Hepatobiliary: No focal liver abnormality is seen. No gallstones, gallbladder wall thickening, or biliary dilatation. Pancreas: Unremarkable. No pancreatic ductal dilatation or surrounding inflammatory changes. Spleen: Normal in size without focal abnormality. Adrenals/Urinary Tract: Unchanged appearance of the adrenal glands with coarse calcifications present bilaterally. Simple left renal cyst which does not require follow-up imaging. Kidneys enhance symmetrically. No renal stone or hydronephrosis. Urinary bladder within normal limits. Stomach/Bowel: Stomach within normal limits. No dilated loops of bowel. Normal appendix in the right lower quadrant (series 15, image 71) no focal bowel wall thickening or inflammatory changes. Small focus of hyperenhancement along the right wall of the anus (series 9, image 24), likely representing internal hemorrhoid. Elsewhere, no abnormal contrast accumulation within the bowel. Lymphatic: Mildly enlarged lymph node in the porta hepatis measuring 1.4 cm in short axis (series 15, image 30), unchanged since at least 2015 and considered benign. No new or enlarging abdominopelvic lymph nodes. Reproductive: Prostate is unremarkable. Other: No free fluid. No abdominopelvic fluid  collection. No pneumoperitoneum. No abdominal wall hernia. Musculoskeletal: No acute or significant osseous findings. IMPRESSION: 1. Small focus of hyperenhancement along the right wall of the anus, likely representing internal hemorrhoid. Elsewhere, no findings of active GI bleed. 2. No acute abdominopelvic findings. Negative for abdominal aortic aneurysm or dissection. Electronically Signed   By: Duanne Guess D.O.   On: 10/30/2022 12:30     Scheduled Meds:  sodium chloride   Intravenous Once   pantoprazole (PROTONIX) IV  40 mg Intravenous Q12H   rosuvastatin  20 mg Oral Daily   Continuous Infusions:  sodium chloride 50 mL/hr at 11/01/22 0153     LOS: 1 day    Standley Dakins M.D on 11/01/2022 at 11:35 AM  Go to [email or web address] - for contact info  [facility] - Office  [phone]  If 7PM-7AM, please contact night-coverage [email or web address] 11/01/2022, 11:35 AM

## 2022-11-02 ENCOUNTER — Telehealth: Payer: Self-pay | Admitting: Gastroenterology

## 2022-11-02 DIAGNOSIS — E785 Hyperlipidemia, unspecified: Secondary | ICD-10-CM | POA: Diagnosis not present

## 2022-11-02 DIAGNOSIS — I10 Essential (primary) hypertension: Secondary | ICD-10-CM | POA: Diagnosis not present

## 2022-11-02 DIAGNOSIS — K219 Gastro-esophageal reflux disease without esophagitis: Secondary | ICD-10-CM | POA: Diagnosis not present

## 2022-11-02 DIAGNOSIS — K922 Gastrointestinal hemorrhage, unspecified: Secondary | ICD-10-CM | POA: Diagnosis not present

## 2022-11-02 LAB — BPAM RBC
ISSUE DATE / TIME: 202405221332
Unit Type and Rh: 5100

## 2022-11-02 LAB — CBC
HCT: 24.9 % — ABNORMAL LOW (ref 39.0–52.0)
Hemoglobin: 8.4 g/dL — ABNORMAL LOW (ref 13.0–17.0)
MCH: 30.5 pg (ref 26.0–34.0)
MCHC: 33.7 g/dL (ref 30.0–36.0)
MCV: 90.5 fL (ref 80.0–100.0)
Platelets: 289 10*3/uL (ref 150–400)
RBC: 2.75 MIL/uL — ABNORMAL LOW (ref 4.22–5.81)
RDW: 15.3 % (ref 11.5–15.5)
WBC: 8.6 10*3/uL (ref 4.0–10.5)
nRBC: 0 % (ref 0.0–0.2)

## 2022-11-02 LAB — TYPE AND SCREEN
ABO/RH(D): O POS
Antibody Screen: NEGATIVE

## 2022-11-02 LAB — DIFFERENTIAL
Abs Immature Granulocytes: 0.03 10*3/uL (ref 0.00–0.07)
Basophils Absolute: 0 10*3/uL (ref 0.0–0.1)
Basophils Relative: 0 %
Eosinophils Absolute: 0.3 10*3/uL (ref 0.0–0.5)
Eosinophils Relative: 3 %
Immature Granulocytes: 0 %
Lymphocytes Relative: 36 %
Lymphs Abs: 3.1 10*3/uL (ref 0.7–4.0)
Monocytes Absolute: 0.7 10*3/uL (ref 0.1–1.0)
Monocytes Relative: 8 %
Neutro Abs: 4.6 10*3/uL (ref 1.7–7.7)
Neutrophils Relative %: 53 %

## 2022-11-02 MED ORDER — PANTOPRAZOLE SODIUM 40 MG PO TBEC: 40.0000 mg | DELAYED_RELEASE_TABLET | Freq: Two times a day (BID) | ORAL | 1 refills | Status: DC

## 2022-11-02 NOTE — Discharge Summary (Signed)
Physician Discharge Summary  Nicolas [name]:[id number] DOB: Sep 07, 1975 DOA: 10/30/2022  PCP: [name], MD GI: [facility]   Admit date: 10/30/2022 Discharge date: 11/02/2022  Admitted From:  Home  Disposition:  Home   Recommendations for Outpatient Follow-up:  Follow up with PCP in 1 weeks Follow up with [facility] in 3 weeks  Please obtain CBC in 1-2 weeks Avoid NSAIDs  Please follow up on the following pending results: GI biopsy results   Home Health: n/a   Discharge Condition: STABLE   CODE STATUS: FULL DIET: resume prior home diet, AVOID ALL NSAIDS per Dr. Levon Hedger   Brief Hospitalization Summary: Please see all hospital notes, images, labs for full details of the hospitalization. ADMISSION PROVIDER HPI:  47 y.o. male with medical history significant of HTN, HLD, carotid artery stenosis, hx of PUD, GERD, psoriasis who presented to ED with complaints of abdominal pain and rectal bleeding.  He ate at [employer] last night around 5pm. This morning around 7:30am he went to the bathroom and he had bright red blood from his rectum with associated generalized abdominal pain. Pain rated as 6/10, does not radiate. Described as intermittent cramping.  He came to straight to ED. He has not had any more BM or bright red blood from rectum. He has generalized abdominal pain. He was nauseated, but feels fine after the zofran. He never has had any GI bleeding. Never had a colonoscopy. No family history of colon cancer or IBD. He has no mouth sores.    He was seen in ED on 5/14 after having 4 days of diarrhea. CT abdomen/pelvis at that time showed new circumferential wall thickening though the mid and distal portions of the duodenum with moderate regional retroperitoneal inflammatory/edematous changes, suggesting duodenitis. No evidence of perforation or abscess. He was given flagyl and clarithromycin which he has been taking. He states his diarrhea subsided and he felt great until he  ate at [employer] last night.     Denies any fevers, has had chills/sweats, vision changes/headaches, chest pain or palpitations, shortness of breath or cough, dysuria or leg swelling.   Hospital Course by problem list   1)Acute GI bleeding/Bright Red blood per Rectum -hgb with large drop 14.6>17.5 (5/14)>10.9>9.0>.8.1>7.3 -CTA abdo/pelvis with no findings of active bleed. Does have internal hemorrhoid -CT abdomen/pelvis on 5/14 suggestive of duodenitis and has been taking flagyl/clarithromycin x 7 days  --EGD and colonoscopy on 10/31/2022 noted -If recurrent clinical bleeding or further drop in Hb, GI will likely proceed with capsule endoscopy.  -Advance diet -Hg down to 7.3, per GI recs, transfused 1 unit PRBC  on 5/22 and Hg up to 8.4  -DC home today with outpatient follow up  -per Dr. Levon Hedger, pt is to avoid all NSAIDs -- ASPIRIN NOT RESTARTED AT DISCHARGE  -EGD and colonoscopy findings below:    2)Norovirus Diarrhea -C. difficile negative -Symptomatic and supportive treatment and patient feeling much better now   3)HTN--- Norvasc   4)HLD--Continue crestor 20mg  daily    5)Stenosis of carotid artery Followed by cardiology -imaging 03/2022: 1. Mild (1-49%) stenosis proximal right internal carotid artery secondary to heterogenous atherosclerotic plaque. 2. Moderate (50-69%) stenosis proximal left internal carotid artery secondary to heterogenous atherosclerotic plaque. DC ASA in setting of GI bleed Continue statin    6)DM2-A1c 6.8 reflecting optimal diabetic control -Low calorie diet, portion control and increase physical activity discussed with patient -Body mass index is 35.41 kg/m. -Follow-up with PCP with for management of diabetes  Plaque psoriasis Started on Skirizi this month and had his first injection on 10/19/22.  Rule out IBD as can see psoriasis with IBD - biopsies pending from colonoscopy   GERD (gastroesophageal reflux disease)/duodenitis Continue Protonix  BID for GI protection  ______________________________________________ EGD FINDINGS: - The examined esophagus was normal.  - Two 3 to 10 mm scars were found in the gastric antrum.  The scar tissue was healthy in appearance.  Biopsies from body and antrum were taken with a cold forceps for Helicobacter pylori testing.  - A benign-appearing, intrinsic mild stenosis was found at the pylorus, likely due to scarring.  This was traversed.  - Segmental moderate inflammation characterized by congestion (edema) and erythema was found in the entire duodenum.  - Dispersed 5 to 15 mm linear scars were found in the second portion of the duodenum.  There was assocaited nodularity to these areas. Biopsies were taken with a cold forceps for histology.  - One non-bleeding superficial duodenal ulcer with a clean ulcer base (Forrest Class III) was found in the ampulla. there was some associated nodularity. The lesion was 6 mm in largest dimension.  Biopsies were taken with a cold forceps for histology.    COLONOSCOPY FINDINGS: - The examined portion of the ileum was normal.  Tattooed.  - One 4 mm polyp in the ascending colon, removed with a cold snare.  Resected and retrieved.  - Diverticulosis in the sigmoid colon.  - The distal rectum and anal verge are normal on retroflexion view.    RECOMMENDATIONS - Return patient to hospital ward for ongoing care.  - Clear liquid diet today.  - Trend H/H daily. - If recurrent clinical bleeding or further drop in Hb, will need to proceed with capsule endoscopy. - No aspirin, ibuprofen, naproxen, or other non-steroidal anti-inflammatory drugs.  - Use Protonix (pantoprazole) 40 mg PO BID.  - Await pathology results.  Repeat colonoscopy for surveillance based on pathology results.    Katrinka Blazing, MD ___________________________________________________  Discharge Diagnoses:  Principal Problem:   Acute GI bleeding Active Problems:   Essential hypertension   Plaque  psoriasis   Hyperlipidemia   GERD (gastroesophageal reflux disease)   Stenosis of carotid artery   Prediabetes   Diarrhea   Elevated ALT measurement   Diabetes mellitus type 2, controlled/A1C 6.8   GI bleed   Discharge Instructions:  Allergies as of 11/02/2022   No Known Allergies      Medication List     STOP taking these medications    aspirin EC 81 MG tablet   clarithromycin 500 MG tablet Commonly known as: BIAXIN   famotidine 20 MG tablet Commonly known as: PEPCID   metroNIDAZOLE 500 MG tablet Commonly known as: Flagyl       TAKE these medications    albuterol 108 (90 Base) MCG/ACT inhaler Commonly known as: VENTOLIN HFA Inhale 2 puffs into the lungs every 4 (four) hours as needed for wheezing or shortness of breath.   amLODipine 5 MG tablet Commonly known as: NORVASC TAKE 1 TABLET BY MOUTH ONCE DAILY.   clobetasol ointment 0.05 % Commonly known as: TEMOVATE APPLY TO AFFECTED AREAS TWICE DAILY AS NEEDED. DO NOT APPLY TO FACE. What changed: See the new instructions.   pantoprazole 40 MG tablet Commonly known as: PROTONIX Take 1 tablet (40 mg total) by mouth 2 (two) times daily.   rosuvastatin 20 MG tablet Commonly known as: CRESTOR Take 1 tablet (20 mg total) by mouth daily.   Skyrizi Pen  150 MG/ML Soaj Generic drug: Risankizumab-rzaa Inject 150 mg into the skin See admin instructions.   traMADol 50 MG tablet Commonly known as: ULTRAM Take 1 tablet (50 mg total) by mouth every 6 (six) hours as needed. What changed: reasons to take this        No Known Allergies Allergies as of 11/02/2022   No Known Allergies      Medication List     STOP taking these medications    aspirin EC 81 MG tablet   clarithromycin 500 MG tablet Commonly known as: BIAXIN   famotidine 20 MG tablet Commonly known as: PEPCID   metroNIDAZOLE 500 MG tablet Commonly known as: Flagyl       TAKE these medications    albuterol 108 (90 Base) MCG/ACT  inhaler Commonly known as: VENTOLIN HFA Inhale 2 puffs into the lungs every 4 (four) hours as needed for wheezing or shortness of breath.   amLODipine 5 MG tablet Commonly known as: NORVASC TAKE 1 TABLET BY MOUTH ONCE DAILY.   clobetasol ointment 0.05 % Commonly known as: TEMOVATE APPLY TO AFFECTED AREAS TWICE DAILY AS NEEDED. DO NOT APPLY TO FACE. What changed: See the new instructions.   pantoprazole 40 MG tablet Commonly known as: PROTONIX Take 1 tablet (40 mg total) by mouth 2 (two) times daily.   rosuvastatin 20 MG tablet Commonly known as: CRESTOR Take 1 tablet (20 mg total) by mouth daily.   Skyrizi Pen 150 MG/ML Soaj Generic drug: Risankizumab-rzaa Inject 150 mg into the skin See admin instructions.   traMADol 50 MG tablet Commonly known as: ULTRAM Take 1 tablet (50 mg total) by mouth every 6 (six) hours as needed. What changed: reasons to take this        Procedures/Studies: CT ANGIO ABDOMEN PELVIS  W & WO CONTRAST  Result Date: 10/30/2022 CLINICAL DATA:  Bright red blood per rectum EXAM: CTA ABDOMEN AND PELVIS WITHOUT AND WITH CONTRAST TECHNIQUE: Multidetector CT imaging of the abdomen and pelvis was performed using the standard protocol during bolus administration of intravenous contrast. Multiplanar reconstructed images and MIPs were obtained and reviewed to evaluate the vascular anatomy. RADIATION DOSE REDUCTION: This exam was performed according to the departmental dose-optimization program which includes automated exposure control, adjustment of the mA and/or kV according to patient size and/or use of iterative reconstruction technique. CONTRAST:  OMNIPAQUE IOHEXOL 350 MG/ML SOLN COMPARISON:  10/24/2022, 02/09/2014 FINDINGS: VASCULAR Aorta: Normal caliber aorta without aneurysm, dissection, vasculitis or significant stenosis. Mild calcified and noncalcified atherosclerotic plaque. Celiac: Patent without evidence of aneurysm, dissection, vasculitis or  significant stenosis. SMA: Patent without evidence of aneurysm, dissection, vasculitis or significant stenosis. Common hepatic artery arises from the SMA, an anatomic variant. Renals: Both renal arteries are patent without evidence of aneurysm, dissection, vasculitis, fibromuscular dysplasia or significant stenosis. IMA: Patent. Inflow: Patent without evidence of aneurysm, dissection, vasculitis or significant stenosis. Proximal Outflow: Bilateral common femoral and visualized portions of the superficial and profunda femoral arteries are patent without evidence of aneurysm, dissection, vasculitis or significant stenosis. Veins: Major venous structures are patent by portal venous phase imaging. Review of the MIP images confirms the above findings. NON-VASCULAR Lower chest: Included lung bases are clear.  Heart size is normal. Hepatobiliary: No focal liver abnormality is seen. No gallstones, gallbladder wall thickening, or biliary dilatation. Pancreas: Unremarkable. No pancreatic ductal dilatation or surrounding inflammatory changes. Spleen: Normal in size without focal abnormality. Adrenals/Urinary Tract: Unchanged appearance of the adrenal glands with coarse calcifications present bilaterally.  Simple left renal cyst which does not require follow-up imaging. Kidneys enhance symmetrically. No renal stone or hydronephrosis. Urinary bladder within normal limits. Stomach/Bowel: Stomach within normal limits. No dilated loops of bowel. Normal appendix in the right lower quadrant (series 15, image 71) no focal bowel wall thickening or inflammatory changes. Small focus of hyperenhancement along the right wall of the anus (series 9, image 24), likely representing internal hemorrhoid. Elsewhere, no abnormal contrast accumulation within the bowel. Lymphatic: Mildly enlarged lymph node in the porta hepatis measuring 1.4 cm in short axis (series 15, image 30), unchanged since at least 2015 and considered benign. No new or  enlarging abdominopelvic lymph nodes. Reproductive: Prostate is unremarkable. Other: No free fluid. No abdominopelvic fluid collection. No pneumoperitoneum. No abdominal wall hernia. Musculoskeletal: No acute or significant osseous findings. IMPRESSION: 1. Small focus of hyperenhancement along the right wall of the anus, likely representing internal hemorrhoid. Elsewhere, no findings of active GI bleed. 2. No acute abdominopelvic findings. Negative for abdominal aortic aneurysm or dissection. Electronically Signed   By: Duanne Guess D.O.   On: 10/30/2022 12:30   CT ABDOMEN PELVIS W CONTRAST  Result Date: 10/24/2022 CLINICAL DATA:  Abdominal pain since Saturday EXAM: CT ABDOMEN AND PELVIS WITH CONTRAST TECHNIQUE: Multidetector CT imaging of the abdomen and pelvis was performed using the standard protocol following bolus administration of intravenous contrast. RADIATION DOSE REDUCTION: This exam was performed according to the departmental dose-optimization program which includes automated exposure control, adjustment of the mA and/or kV according to patient size and/or use of iterative reconstruction technique. CONTRAST:  OMNIPAQUE IOHEXOL 300 MG/ML  SOLN COMPARISON:  04/05/2022 and previous FINDINGS: Lower chest: Trace pericardial effusion. No pleural effusion. Visualized lung bases clear. Hepatobiliary: No focal liver abnormality is seen. No gallstones, gallbladder wall thickening, or biliary dilatation. Pancreas: Unremarkable. No pancreatic ductal dilatation or surrounding inflammatory changes. Spleen: Normal in size without focal abnormality. Adrenals/Urinary Tract: Coarse bilateral adrenal calcifications present since scans dating back to 01/01/2002. Benign 1.9 cm mid left renal cyst present since 02/09/2014 ; no follow-up warranted. No urolithiasis or hydronephrosis. Urinary bladder nondistended. Stomach/Bowel: Stomach is incompletely distended, unremarkable. Pylorus normal. Duodenal bulb distends  normally. There is new circumferential wall thickening through the mid and distal portions of the duodenum, with mild distension, and moderate regional retroperitoneal inflammatory/edematous change. No evidence of perforation or abscess. The jejunum and ileum are nondistended, unremarkable. Normal appendix. Colon is incompletely distended by gas and fecal material, unremarkable. Vascular/Lymphatic: Minimal scattered aortoiliac calcified atheromatous plaque without aneurysm. Portal vein patent. No abdominal or pelvic adenopathy. Reproductive: Mild prostate enlargement with scattered coarse calcifications. Other: Bilateral pelvic phleboliths.  No ascites.  No free air. Musculoskeletal: Mild T12 vertebral compression deformity stable since prior study. IMPRESSION: 1. New circumferential wall thickening through the mid and distal portions of the duodenum, with moderate regional retroperitoneal inflammatory/edematous change, suggesting duodenitis. No evidence of perforation or abscess. 2.  Aortic Atherosclerosis (ICD10-I70.0). Electronically Signed   By: Corlis Leak M.D.   On: 10/24/2022 19:48     Subjective: Pt reports no further bleeding or black stool. Tolerating diet and abd pain resolved. Eager to go home.   Discharge Exam: Vitals:   11/02/22 0445 11/02/22 0833  BP: 119/63 124/64  Pulse: 82 85  Resp: 17 18  Temp: 98.4 F (36.9 C) 98.2 F (36.8 C)  SpO2: 98% 97%   Vitals:   11/01/22 1518 11/01/22 1947 11/02/22 0445 11/02/22 0833  BP: (!) 108/50 129/71 119/63 124/64  Pulse:  95 91 82 85  Resp: 18 20 17 18   Temp: 99.7 F (37.6 C) 98.6 F (37 C) 98.4 F (36.9 C) 98.2 F (36.8 C)  TempSrc: Oral Oral Oral Oral  SpO2: 99% 98% 98% 97%  Weight:      Height:        General: Pt is alert, awake, not in acute distress Cardiovascular: RRR, S1/S2 +, no rubs, no gallops Respiratory: CTA bilaterally, no wheezing, no rhonchi Abdominal: Soft, NT, ND, bowel sounds + Extremities: no edema, no cyanosis    The results of significant diagnostics from this hospitalization (including imaging, microbiology, ancillary and laboratory) are listed below for reference.     Microbiology: Recent Results (from the past 240 hour(s))  MRSA Next Gen by PCR, Nasal     Status: None   Collection Time: 10/30/22  6:15 PM   Specimen: Nasal Mucosa; Nasal Swab  Result Value Ref Range Status   MRSA by PCR Next Gen NOT DETECTED NOT DETECTED Final    Comment: (NOTE) The GeneXpert MRSA Assay (FDA approved for NASAL specimens only), is one component of a comprehensive MRSA colonization surveillance program. It is not intended to diagnose MRSA infection nor to guide or monitor treatment for MRSA infections. Test performance is not FDA approved in patients less than 48 years old. Performed at [facility], [address], [place], [place] [address]   Gastrointestinal Panel by PCR , Stool     Status: Abnormal   Collection Time: 10/30/22 11:00 PM  Result Value Ref Range Status   Campylobacter species NOT DETECTED NOT DETECTED Final   Plesimonas shigelloides NOT DETECTED NOT DETECTED Final   Salmonella species NOT DETECTED NOT DETECTED Final   Yersinia enterocolitica NOT DETECTED NOT DETECTED Final   Vibrio species NOT DETECTED NOT DETECTED Final   Vibrio cholerae NOT DETECTED NOT DETECTED Final   Enteroaggregative E coli (EAEC) NOT DETECTED NOT DETECTED Final   Enteropathogenic E coli (EPEC) NOT DETECTED NOT DETECTED Final   Enterotoxigenic E coli (ETEC) NOT DETECTED NOT DETECTED Final   Shiga like toxin producing E coli (STEC) NOT DETECTED NOT DETECTED Final   Shigella/Enteroinvasive E coli (EIEC) NOT DETECTED NOT DETECTED Final   Cryptosporidium NOT DETECTED NOT DETECTED Final   Cyclospora cayetanensis NOT DETECTED NOT DETECTED Final   Entamoeba histolytica NOT DETECTED NOT DETECTED Final   Giardia lamblia NOT DETECTED NOT DETECTED Final   Adenovirus F40/41 NOT DETECTED NOT DETECTED Final   Astrovirus NOT  DETECTED NOT DETECTED Final   Norovirus GI/GII DETECTED (A) NOT DETECTED Final    Comment: RESULT CALLED TO, READ BACK BY AND VERIFIED WITH: JADA MUSE RN 1403 10/31/22 HNM    Rotavirus A NOT DETECTED NOT DETECTED Final   Sapovirus (I, II, IV, and V) NOT DETECTED NOT DETECTED Final    Comment: Performed at [facility], [address] [place], [place] [address]  C Difficile Quick Screen w PCR reflex     Status: None   Collection Time: 10/30/22 11:00 PM   Specimen: STOOL  Result Value Ref Range Status   C Diff antigen NEGATIVE NEGATIVE Final   C Diff toxin NEGATIVE NEGATIVE Final   C Diff interpretation No C. difficile detected.  Final    Comment: Performed at [facility], [address] [place], [place] [address]     Labs: BNP (last 3 results) No results for input(s): "BNP" in the last 8760 hours. Basic Metabolic Panel: Recent Labs  Lab 10/30/22 1058 10/31/22 0457 11/01/22  0434  NA 136 134* 136  K 4.5 3.7 4.0  CL 105 103 105  CO2 25 27 26   GLUCOSE 178* 141* 122*  BUN 33* 21* 19  CREATININE 0.80 0.82 0.88  CALCIUM 8.0* 7.8* 7.9*   Liver Function Tests: Recent Labs  Lab 10/30/22 1058 10/31/22 0457  AST 39 30  ALT 77* 59*  ALKPHOS 59 40  BILITOT 0.4 0.7  PROT 5.3* 4.7*  ALBUMIN 3.0* 2.6*   No results for input(s): "LIPASE", "AMYLASE" in the last 168 hours. No results for input(s): "AMMONIA" in the last 168 hours. CBC: Recent Labs  Lab 10/30/22 1626 10/30/22 2143 10/31/22 0457 11/01/22 0434 11/01/22 1757 11/02/22 0414  WBC 11.4* 10.3 8.8 12.5*  --  8.6  NEUTROABS  --   --   --   --   --  4.6  HGB 9.1* 8.7* 8.1* 7.3* 8.1* 8.4*  HCT 26.2* 25.2* 23.4* 21.5* 23.5* 24.9*  MCV 86.8 87.5 88.3 90.0  --  90.5  PLT 308 317 294 285  --  289   Cardiac Enzymes: No results for input(s): "CKTOTAL", "CKMB", "CKMBINDEX", "TROPONINI" in the last 168 hours. BNP: Invalid input(s): "POCBNP" CBG: No results for input(s): "GLUCAP" in the last 168  hours. D-Dimer No results for input(s): "DDIMER" in the last 72 hours. Hgb A1c No results for input(s): "HGBA1C" in the last 72 hours. Lipid Profile No results for input(s): "CHOL", "HDL", "LDLCALC", "TRIG", "CHOLHDL", "LDLDIRECT" in the last 72 hours. Thyroid function studies Recent Labs    10/30/22 1640  TSH 2.746   Anemia work up No results for input(s): "VITAMINB12", "FOLATE", "FERRITIN", "TIBC", "IRON", "RETICCTPCT" in the last 72 hours. Urinalysis    Component Value Date/Time   COLORURINE YELLOW 10/24/2022 1842   APPEARANCEUR CLEAR 10/24/2022 1842   LABSPEC 1.029 10/24/2022 1842   PHURINE 6.0 10/24/2022 1842   GLUCOSEU NEGATIVE 10/24/2022 1842   HGBUR NEGATIVE 10/24/2022 1842   BILIRUBINUR NEGATIVE 10/24/2022 1842   BILIRUBINUR + 05/20/2020 1343   KETONESUR NEGATIVE 10/24/2022 1842   PROTEINUR 30 (A) 10/24/2022 1842   UROBILINOGEN 0.2 05/20/2020 1343   UROBILINOGEN 4.0 (H) 02/09/2014 2021   NITRITE NEGATIVE 10/24/2022 1842   LEUKOCYTESUR NEGATIVE 10/24/2022 1842   Sepsis Labs Recent Labs  Lab 10/30/22 2143 10/31/22 0457 11/01/22 0434 11/02/22 0414  WBC 10.3 8.8 12.5* 8.6   Microbiology Recent Results (from the past 240 hour(s))  MRSA Next Gen by PCR, Nasal     Status: None   Collection Time: 10/30/22  6:15 PM   Specimen: Nasal Mucosa; Nasal Swab  Result Value Ref Range Status   MRSA by PCR Next Gen NOT DETECTED NOT DETECTED Final    Comment: (NOTE) The GeneXpert MRSA Assay (FDA approved for NASAL specimens only), is one component of a comprehensive MRSA colonization surveillance program. It is not intended to diagnose MRSA infection nor to guide or monitor treatment for MRSA infections. Test performance is not FDA approved in patients less than 75 years old. Performed at [facility], [address], [place], [place] [address]   Gastrointestinal Panel by PCR , Stool     Status: Abnormal   Collection Time: 10/30/22 11:00 PM  Result Value Ref Range Status    Campylobacter species NOT DETECTED NOT DETECTED Final   Plesimonas shigelloides NOT DETECTED NOT DETECTED Final   Salmonella species NOT DETECTED NOT DETECTED Final   Yersinia enterocolitica NOT DETECTED NOT DETECTED Final   Vibrio species NOT DETECTED NOT DETECTED Final  Vibrio cholerae NOT DETECTED NOT DETECTED Final   Enteroaggregative E coli (EAEC) NOT DETECTED NOT DETECTED Final   Enteropathogenic E coli (EPEC) NOT DETECTED NOT DETECTED Final   Enterotoxigenic E coli (ETEC) NOT DETECTED NOT DETECTED Final   Shiga like toxin producing E coli (STEC) NOT DETECTED NOT DETECTED Final   Shigella/Enteroinvasive E coli (EIEC) NOT DETECTED NOT DETECTED Final   Cryptosporidium NOT DETECTED NOT DETECTED Final   Cyclospora cayetanensis NOT DETECTED NOT DETECTED Final   Entamoeba histolytica NOT DETECTED NOT DETECTED Final   Giardia lamblia NOT DETECTED NOT DETECTED Final   Adenovirus F40/41 NOT DETECTED NOT DETECTED Final   Astrovirus NOT DETECTED NOT DETECTED Final   Norovirus GI/GII DETECTED (A) NOT DETECTED Final    Comment: RESULT CALLED TO, READ BACK BY AND VERIFIED WITH: JADA MUSE RN 1403 10/31/22 HNM    Rotavirus A NOT DETECTED NOT DETECTED Final   Sapovirus (I, II, IV, and V) NOT DETECTED NOT DETECTED Final    Comment: Performed at [facility], [address] [place], [place] [address]  C Difficile Quick Screen w PCR reflex     Status: None   Collection Time: 10/30/22 11:00 PM   Specimen: STOOL  Result Value Ref Range Status   C Diff antigen NEGATIVE NEGATIVE Final   C Diff toxin NEGATIVE NEGATIVE Final   C Diff interpretation No C. difficile detected.  Final    Comment: Performed at [facility], [address], [place], [place] [address]   Time coordinating discharge: 40 mins  SIGNED:  Standley Dakins, MD  [facility] 11/02/2022, 11:06 AM How to contact the [facility] Attending or Consulting provider 7A - 7P or covering provider during after hours 7P -7A, for  this patient?  Check the care team in [facility] and look for a) attending/consulting [facility] provider listed and b) the [facility] team listed Log into [email or web address] and use [place]'s universal password to access. If you do not have the password, please contact the hospital operator. Locate the [facility] provider you are looking for under [facility] and page to a number that you can be directly reached. If you still have difficulty reaching the provider, please page the [facility] (Director on Call) for the Hospitalists listed on amion for assistance.

## 2022-11-02 NOTE — Discharge Instructions (Addendum)
No aspirin, ibuprofen, naproxen, or other non-steroidal anti-inflammatory drugs   IMPORTANT INFORMATION: PAY CLOSE ATTENTION   PHYSICIAN DISCHARGE INSTRUCTIONS  Follow with Primary care provider  [name], MD  and other consultants as instructed by your Hospitalist Physician  SEEK MEDICAL CARE OR RETURN TO EMERGENCY ROOM IF SYMPTOMS COME BACK, WORSEN OR NEW PROBLEM DEVELOPS   Please note: You were cared for by a hospitalist during your hospital stay. Every effort will be made to forward records to your primary care provider.  You can request that your primary care provider send for your hospital records if they have not received them.  Once you are discharged, your primary care physician will handle any further medical issues. Please note that NO REFILLS for any discharge medications will be authorized once you are discharged, as it is imperative that you return to your primary care physician (or establish a relationship with a primary care physician if you do not have one) for your post hospital discharge needs so that they can reassess your need for medications and monitor your lab values.  Please get a complete blood count and chemistry panel checked by your Primary MD at your next visit, and again as instructed by your Primary MD.  Get Medicines reviewed and adjusted: Please take all your medications with you for your next visit with your Primary MD  Laboratory/radiological data: Please request your Primary MD to go over all hospital tests and procedure/radiological results at the follow up, please ask your primary care provider to get all Hospital records sent to his/her office.  In some cases, they will be blood work, cultures and biopsy results pending at the time of your discharge. Please request that your primary care provider follow up on these results.  If you are diabetic, please bring your blood sugar readings with you to your follow up appointment with primary care.     Please call and make your follow up appointments as soon as possible.    Also Note the following: If you experience worsening of your admission symptoms, develop shortness of breath, life threatening emergency, suicidal or homicidal thoughts you must seek medical attention immediately by calling 911 or calling your MD immediately  if symptoms less severe.  You must read complete instructions/literature along with all the possible adverse reactions/side effects for all the Medicines you take and that have been prescribed to you. Take any new Medicines after you have completely understood and accpet all the possible adverse reactions/side effects.   Do not drive when taking Pain medications or sleeping medications (Benzodiazepines)  Do not take more than prescribed Pain, Sleep and Anxiety Medications. It is not advisable to combine anxiety,sleep and pain medications without talking with your primary care practitioner  Special Instructions: If you have smoked or chewed Tobacco  in the last 2 yrs please stop smoking, stop any regular Alcohol  and or any Recreational drug use.  Wear Seat belts while driving.  Do not drive if taking any narcotic, mind altering or controlled substances or recreational drugs or alcohol.

## 2022-11-02 NOTE — Plan of Care (Signed)

## 2022-11-02 NOTE — Progress Notes (Signed)
Patient discharged with instructions given on medications and follow up visits ,patient verbalized understanding. Prescriptions sent to Pharmacy of choice documented on the AVS. IV discontinued,catheter intact.Accompanied by staff to an awaiting vehicle.

## 2022-11-02 NOTE — Telephone Encounter (Addendum)
Opened in error

## 2022-11-02 NOTE — Telephone Encounter (Signed)
Please schedule hospital follow up in 2 weeks with Lewie Loron or Dr. Marletta Lor.

## 2022-11-03 ENCOUNTER — Encounter (HOSPITAL_COMMUNITY): Payer: Self-pay | Admitting: Gastroenterology

## 2022-11-03 ENCOUNTER — Telehealth: Payer: Self-pay

## 2022-11-03 NOTE — Transitions of Care (Post Inpatient/ED Visit) (Signed)
11/03/2022  Name: Nicolas [name] MRN: [id number] DOB: 07-13-1975  Today's TOC FU Call Status: Today's TOC FU Call Status:: Successful TOC FU Call Competed TOC FU Call Complete Date: 11/03/22 (Incoming call from patient returning RN CM call)  Transition Care Management Follow-up Telephone Call Date of Discharge: 11/02/22 [facility] Type of Discharge: Inpatient Admission Primary Inpatient Discharge Diagnosis:: "lower GI bleed" How have you been since you were released from the hospital?: Better (Pt states he is feeling much better-eating good-has had no N&V or other GI sxs. He has not had a BM since returning home but reports "passing alot of gas.") Any questions or concerns?: No  Items Reviewed: Did you receive and understand the discharge instructions provided?: Yes Medications obtained,verified, and reconciled?: Yes (Medications Reviewed) (Pt states went to get Protonix from pharmacy & med not available. RN called [facility]-pharmacist confirms med filled on 10/25/22 for 15day supply-next fill on 11/06/22-pt made aware-thinks he threw med away-will check to see if he has med at home) Any new allergies since your discharge?: No Dietary orders reviewed?: Yes Type of Diet Ordered:: low salt/heart healthy Do you have support at home?: Yes People in Home: spouse Name of Support/Comfort Primary Source: Cindy  Medications Reviewed Today: Medications Reviewed Today     Reviewed by Charlyn Minerva, RN (Registered Nurse) on 11/03/22 at 1500  Med List Status: <None>   Medication Order Taking? Sig Documenting Provider Last Dose Status Informant  albuterol (VENTOLIN HFA) 108 (90 Base) MCG/ACT inhaler [id number] No Inhale 2 puffs into the lungs every 4 (four) hours as needed for wheezing or shortness of breath. Roosvelt Maser [place], [place] UNK Active Self  amLODipine (NORVASC) 5 MG tablet [id number]  TAKE 1 TABLET BY MOUTH ONCE DAILY. [name],  MD  Active   clobetasol ointment (TEMOVATE) 0.05 % [id number] No APPLY TO AFFECTED AREAS TWICE DAILY AS NEEDED. DO NOT APPLY TO FACE.  Patient taking differently: Apply 1 Application topically 2 (two) times daily.   [name], MD Past Week Active Self  pantoprazole (PROTONIX) 40 MG tablet [id number]  Take 1 tablet (40 mg total) by mouth 2 (two) times daily. Johnson, Clanford L, MD  Active   rosuvastatin (CRESTOR) 20 MG tablet [id number] No Take 1 tablet (20 mg total) by mouth daily. Furth, Cadence H, PA-C 10/29/2022 Active Self  SKYRIZI PEN 150 MG/ML SOAJ [id number] No Inject 150 mg into the skin See admin instructions. [provider] Past Week Active Self           Med Note Wynelle Link   Mon Oct 30, 2022 11:19 AM) Past received first dose "a week and a half ago"  traMADol (ULTRAM) 50 MG tablet [id number] No Take 1 tablet (50 mg total) by mouth every 6 (six) hours as needed.  Patient taking differently: Take 50 mg by mouth every 6 (six) hours as needed for moderate pain.   Cathren Laine, MD 10/29/2022 Active Self            Home Care and Equipment/Supplies: Were Home Health Services Ordered?: NA Any new equipment or medical supplies ordered?: NA  Functional Questionnaire: Do you need assistance with bathing/showering or dressing?: No Do you need assistance with meal preparation?: No Do you need assistance with eating?: No Do you have difficulty maintaining continence: No Do you need assistance with getting out of bed/getting out of a chair/moving?: No Do you have difficulty managing or taking your medications?: No  Follow up  appointments reviewed: PCP Follow-up appointment confirmed?: Yes Date of PCP follow-up appointment?: 11/07/22 Follow-up Provider: Dr. Gerda Diss Specialist [facility] Follow-up appointment confirmed?: No Reason Specialist Follow-Up Not Confirmed: Patient has Specialist Provider Number and will Call for Appointment (pt aware to follow up with GI office  regarding appt) Do you need transportation to your follow-up appointment?: No Do you understand care options if your condition(s) worsen?: Yes-patient verbalized understanding  SDOH Interventions Today    Flowsheet Row Most Recent Value  SDOH Interventions   Food Insecurity Interventions Intervention Not Indicated  Transportation Interventions Intervention Not Indicated      TOC Interventions Today    Flowsheet Row Most Recent Value  TOC Interventions   TOC Interventions Discussed/Reviewed TOC Interventions Discussed      Interventions Today    Flowsheet Row Most Recent Value  Chronic Disease   Chronic disease during today's visit Hypertension (HTN)  General Interventions   General Interventions Discussed/Reviewed General Interventions Discussed, Doctor Visits, Durable Medical Equipment (DME)  Doctor Visits Discussed/Reviewed Doctor Visits Discussed, PCP, Specialist  Durable Medical Equipment (DME) BP Cuff  PCP/Specialist Visits Compliance with follow-up visit  Education Interventions   Education Provided Provided Education  Provided Verbal Education On Nutrition, When to see the doctor, Medication  Nutrition Interventions   Nutrition Discussed/Reviewed Nutrition Discussed, Adding fruits and vegetables, Decreasing salt  Pharmacy Interventions   Pharmacy Dicussed/Reviewed Pharmacy Topics Discussed, Medications and their functions       Alessandra Grout [facility] Care Management Care Management Community Coordinator Direct Phone: [phone] Toll Free: [phone] Fax: [phone]

## 2022-11-03 NOTE — Transitions of Care (Post Inpatient/ED Visit) (Signed)
   11/03/2022  Name: Nicolas [name] MRN: [id number] DOB: Oct 25, 1975  Today's TOC FU Call Status: Unsuccessful Call (1st Attempt) Date: 11/03/22  Attempted to reach the patient regarding the most recent Inpatient/ED visit.  Follow Up Plan: Additional outreach attempts will be made to reach the patient to complete the Transitions of Care (Post Inpatient/ED visit) call.     Antionette Fairy, RN,BSN,CCM [facility] Care Management Care Management Community Coordinator Direct Phone: [phone] Toll Free: [phone] Fax: [phone]

## 2022-11-04 LAB — GASTRIN: Gastrin: 21 pg/mL (ref 0–115)

## 2022-11-07 ENCOUNTER — Ambulatory Visit: Payer: 59 | Admitting: Family Medicine

## 2022-11-07 VITALS — BP 133/85 | HR 90 | Ht 75.0 in | Wt 283.0 lb

## 2022-11-07 DIAGNOSIS — E785 Hyperlipidemia, unspecified: Secondary | ICD-10-CM

## 2022-11-07 DIAGNOSIS — Z8719 Personal history of other diseases of the digestive system: Secondary | ICD-10-CM | POA: Diagnosis not present

## 2022-11-07 DIAGNOSIS — R739 Hyperglycemia, unspecified: Secondary | ICD-10-CM

## 2022-11-07 DIAGNOSIS — D509 Iron deficiency anemia, unspecified: Secondary | ICD-10-CM | POA: Diagnosis not present

## 2022-11-07 DIAGNOSIS — D369 Benign neoplasm, unspecified site: Secondary | ICD-10-CM

## 2022-11-07 DIAGNOSIS — K2980 Duodenitis without bleeding: Secondary | ICD-10-CM

## 2022-11-07 MED ORDER — PANTOPRAZOLE SODIUM 40 MG PO TBEC: 40.0000 mg | DELAYED_RELEASE_TABLET | Freq: Two times a day (BID) | ORAL | 1 refills | Status: DC

## 2022-11-07 NOTE — Progress Notes (Signed)
   Subjective:    Patient ID: Nicolas [name], male    DOB: [birth year], 47 y.o.   MRN: [id number]  HPI Patient arrives today for hospital follow up.  Patient relates hospital follow-up He had GI bleed Required transfusion Feels better now Denies rectal bleeding No abdominal pain States he got essentially food poisoning and it led to inflammation in his duodenum as well as rectal bleeding He had scope both colonoscopy and EGD by gastroenterology He did discover a tubular adenoma and duodenitis but no other specific cause of the bleeding    Review of Systems     Objective:   Physical Exam  General-in no acute distress Eyes-no discharge Lungs-respiratory rate normal, CTA CV-no murmurs,RRR Extremities skin warm dry no edema Neuro grossly normal Behavior normal, alert  Patient here today for hospital follow-up     Assessment & Plan:  1. Iron deficiency anemia, unspecified iron deficiency anemia type Check lab work next week to see if the hemoglobin is coming up patient does not appear to be in any distress do not need to do labs today - CBC with Differential - Basic Metabolic Panel (7)  2. Duodenitis Recovering.  Bowel movements are becoming back to normal abdomen is soft - CBC with Differential - Basic Metabolic Panel (7)  3. Tubular adenoma More than likely will need a follow-up colonoscopy in 3 to 5 years per specialist - CBC with Differential - Basic Metabolic Panel (7)  4. Hyperlipidemia, unspecified hyperlipidemia type Continue statin no need to check lab work currently - CBC with Differential - Basic Metabolic Panel (7)  5. History of GI bleed GI wants him to follow-up which does seem reasonable In addition to this regular bowel movements are occurring currently with no blackness or blood in the bowel movement.  See what the CBC shows. - CBC with Differential - Basic Metabolic Panel (7)  Mild erectile dysfunction patient will monitor he states this just  happened since being in the hospital if this persists may consider Viagra

## 2022-11-23 ENCOUNTER — Ambulatory Visit: Payer: 59 | Admitting: Gastroenterology

## 2022-11-24 DIAGNOSIS — E785 Hyperlipidemia, unspecified: Secondary | ICD-10-CM | POA: Diagnosis not present

## 2022-11-24 DIAGNOSIS — D369 Benign neoplasm, unspecified site: Secondary | ICD-10-CM | POA: Diagnosis not present

## 2022-11-24 DIAGNOSIS — D509 Iron deficiency anemia, unspecified: Secondary | ICD-10-CM | POA: Diagnosis not present

## 2022-11-24 DIAGNOSIS — K2980 Duodenitis without bleeding: Secondary | ICD-10-CM | POA: Diagnosis not present

## 2022-11-24 DIAGNOSIS — Z8719 Personal history of other diseases of the digestive system: Secondary | ICD-10-CM | POA: Diagnosis not present

## 2022-11-25 LAB — BASIC METABOLIC PANEL (7)
BUN/Creatinine Ratio: 18 (ref 9–20)
BUN: 16 mg/dL (ref 6–24)
CO2: 22 mmol/L (ref 20–29)
Chloride: 103 mmol/L (ref 96–106)
Creatinine, Ser: 0.88 mg/dL (ref 0.76–1.27)
Glucose: 253 mg/dL — ABNORMAL HIGH (ref 70–99)
Potassium: 4.1 mmol/L (ref 3.5–5.2)
Sodium: 139 mmol/L (ref 134–144)
eGFR: 108 mL/min/{1.73_m2} (ref >59)

## 2022-11-25 LAB — CBC WITH DIFFERENTIAL/PLATELET
Basophils Absolute: 0 10*3/uL (ref 0.0–0.2)
Basos: 0 %
EOS (ABSOLUTE): 0.1 10*3/uL (ref 0.0–0.4)
Eos: 2 %
Hematocrit: 39.8 % (ref 37.5–51.0)
Hemoglobin: 12.8 g/dL — ABNORMAL LOW (ref 13.0–17.7)
Immature Grans (Abs): 0.1 10*3/uL (ref 0.0–0.1)
Immature Granulocytes: 1 %
Lymphocytes Absolute: 1.8 10*3/uL (ref 0.7–3.1)
Lymphs: 27 %
MCH: 29.8 pg (ref 26.6–33.0)
MCHC: 32.2 g/dL (ref 31.5–35.7)
MCV: 93 fL (ref 79–97)
Monocytes Absolute: 0.4 10*3/uL (ref 0.1–0.9)
Monocytes: 6 %
Neutrophils Absolute: 4.2 10*3/uL (ref 1.4–7.0)
Neutrophils: 64 %
Platelets: 283 10*3/uL (ref 150–450)
RBC: 4.3 x10E6/uL (ref 4.14–5.80)
RDW: 14 % (ref 11.6–15.4)
WBC: 6.6 10*3/uL (ref 3.4–10.8)

## 2022-11-28 NOTE — Addendum Note (Signed)
Addended by: Margaretha Sheffield on: 11/28/2022 02:32 PM   Modules accepted: Orders

## 2022-12-06 ENCOUNTER — Telehealth: Payer: Self-pay

## 2022-12-06 ENCOUNTER — Encounter: Payer: Self-pay | Admitting: Family Medicine

## 2022-12-06 NOTE — Telephone Encounter (Signed)
Spoke with patient and advised per Dr Adriana Simas Recommend increase in Amlodipine to 10 mg daily. Called patient and patient states that he didn't not feel comfortable with Dr Patsey Berthold recommendations and he has not been taking the 5 mg of Amlodipine daily. Patient states he will take the 5 mg Amlodipine and wait until Dr Lorin Picket gets back into the office on Monday.

## 2022-12-07 NOTE — Telephone Encounter (Signed)
He can stick with the 5 mg, take it religiously every single day, call us next week with updates regarding blood pressure thank you

## 2022-12-07 NOTE — Telephone Encounter (Signed)
Patient called and advised per Dr Lorin Picket He can stick with the 5 mg, take it religiously every single day, call us next week with updates regarding blood pressure thank you. Patient verbalized understanding.

## 2022-12-26 ENCOUNTER — Encounter: Payer: Self-pay | Admitting: Family Medicine

## 2022-12-27 ENCOUNTER — Ambulatory Visit (INDEPENDENT_AMBULATORY_CARE_PROVIDER_SITE_OTHER): Payer: 59 | Admitting: Family Medicine

## 2022-12-27 ENCOUNTER — Telehealth: Payer: Self-pay

## 2022-12-27 DIAGNOSIS — I10 Essential (primary) hypertension: Secondary | ICD-10-CM

## 2022-12-27 MED ORDER — AMLODIPINE BESYLATE 10 MG PO TABS
10.0000 mg | ORAL_TABLET | Freq: Every day | ORAL | 4 refills | Status: DC
Start: 2022-12-27 — End: 2023-05-18

## 2022-12-27 NOTE — Progress Notes (Signed)
   Subjective:    Patient ID: Nicolas [name], male    DOB: 1976-06-05, 47 y.o.   MRN: [id number]  HPI Pt comes in today for a Blood Pressure check Patient states he did not feel good yesterday they checked his blood pressure is elevated he denies any numbness tingling denies shortness of breath denies sweats chills or any chest pressure he does relate how he is doing a good job of trying to eat healthy stays very busy with his work has a history of hypertension  Review of Systems     Objective:   Physical Exam General-in no acute distress Eyes-no discharge Lungs-respiratory rate normal, CTA CV-no murmurs,RRR Extremities skin warm dry no edema Neuro grossly normal Behavior normal, alert        Assessment & Plan:  Significant hypertension Bump up the dose amlodipine We did discuss trying a different medicine he will prefer to try the higher dose amlodipine side effects including ankle swelling was discussed follow-up if progressive troubles  Follow-up again in a few weeks time he has ability to check his blood pressures at home his wife is a CNA he will send [place] some readings

## 2022-12-27 NOTE — Telephone Encounter (Signed)
Called and left message for patient to call office (NURSE NOTE* Need to know if patient is taking blood pressure medication)

## 2022-12-28 DIAGNOSIS — M2021 Hallux rigidus, right foot: Secondary | ICD-10-CM | POA: Diagnosis not present

## 2022-12-28 DIAGNOSIS — S8410XA Injury of peroneal nerve at lower leg level, unspecified leg, initial encounter: Secondary | ICD-10-CM | POA: Diagnosis not present

## 2022-12-28 DIAGNOSIS — M79671 Pain in right foot: Secondary | ICD-10-CM | POA: Diagnosis not present

## 2023-02-07 ENCOUNTER — Ambulatory Visit: Payer: 59 | Admitting: Family Medicine

## 2023-03-13 ENCOUNTER — Ambulatory Visit: Payer: 59 | Admitting: Family Medicine

## 2023-04-09 ENCOUNTER — Ambulatory Visit: Payer: 59 | Attending: Internal Medicine

## 2023-04-09 DIAGNOSIS — I779 Disorder of arteries and arterioles, unspecified: Secondary | ICD-10-CM

## 2023-04-09 DIAGNOSIS — E785 Hyperlipidemia, unspecified: Secondary | ICD-10-CM | POA: Diagnosis not present

## 2023-04-09 DIAGNOSIS — I6523 Occlusion and stenosis of bilateral carotid arteries: Secondary | ICD-10-CM

## 2023-04-09 DIAGNOSIS — R739 Hyperglycemia, unspecified: Secondary | ICD-10-CM | POA: Diagnosis not present

## 2023-04-10 LAB — MICROALBUMIN / CREATININE URINE RATIO
Creatinine, Urine: 199.6 mg/dL
Microalb/Creat Ratio: 53 mg/g{creat} — ABNORMAL HIGH (ref 0–29)
Microalbumin, Urine: 105.2 ug/mL

## 2023-04-10 LAB — HEMOGLOBIN A1C
Est. average glucose Bld gHb Est-mCnc: 177 mg/dL
Hgb A1c MFr Bld: 7.8 % — ABNORMAL HIGH (ref 4.8–5.6)

## 2023-04-10 LAB — LIPID PANEL
Chol/HDL Ratio: 3.7 {ratio} (ref 0.0–5.0)
Cholesterol, Total: 130 mg/dL (ref 100–199)
HDL: 35 mg/dL — ABNORMAL LOW (ref >39)
LDL Chol Calc (NIH): 66 mg/dL (ref 0–99)
Triglycerides: 167 mg/dL — ABNORMAL HIGH (ref 0–149)
VLDL Cholesterol Cal: 29 mg/dL (ref 5–40)

## 2023-04-13 ENCOUNTER — Ambulatory Visit (INDEPENDENT_AMBULATORY_CARE_PROVIDER_SITE_OTHER): Payer: 59 | Admitting: Family Medicine

## 2023-04-13 VITALS — BP 134/82 | HR 86 | Temp 98.4°F | Ht 75.0 in | Wt 289.4 lb

## 2023-04-13 DIAGNOSIS — E785 Hyperlipidemia, unspecified: Secondary | ICD-10-CM | POA: Diagnosis not present

## 2023-04-13 DIAGNOSIS — L400 Psoriasis vulgaris: Secondary | ICD-10-CM | POA: Diagnosis not present

## 2023-04-13 DIAGNOSIS — I10 Essential (primary) hypertension: Secondary | ICD-10-CM

## 2023-04-13 DIAGNOSIS — E119 Type 2 diabetes mellitus without complications: Secondary | ICD-10-CM

## 2023-04-13 NOTE — Progress Notes (Signed)
Subjective:    Patient ID: Nicolas [name], male    DOB: March 07, 1976, 47 y.o.   MRN: [id number]  Discussed the use of AI scribe software for clinical note transcription with the patient, who gave verbal consent to proceed.  History of Present Illness   The patient, with a history of diabetes and psoriasis, presents with concerns about his elevated blood sugar levels. He reports a significant intake of Red Bulls, consuming four cans daily, which he has recently ceased. He has also stopped consuming sweet tea and salt. He is planning to start a diet and exercise regimen, with the help of a diet practitioner.  The patient has noticed a headache since stopping the Red Bulls, but otherwise reports no other symptoms. He is hopeful that lifestyle modifications will help manage his diabetes. He is considering monitoring his blood sugar levels at home and is open to trying a continuous glucose monitor for a month to understand how his diet affects his blood sugar levels.  In addition to diabetes, the patient has psoriasis. He reports significant improvement in his skin condition since starting a medication called SkyRigi. He mentions that his skin no longer cracks or bleeds, and the severe itching has subsided. He also reports that he has lost weight since his last visit.  The patient has a family history of diabetes, with a stepbrother who has severe diabetes and has lost some toes due to the disease. He also reports that his father had a sedentary lifestyle and consumed a lot of sweets but did not have diabetes.  The patient used to exercise regularly, spending 45 minutes to an hour on the treadmill daily, but stopped after an episode of food poisoning earlier in the year. He expresses a desire to resume his exercise regimen. He also mentions that he is a fan of the [address] and enjoys watching their games.         Review of Systems     Objective:    Physical Exam   VITALS: BP-  130/84 CHEST: Lungs clear to auscultation. CARDIOVASCULAR: Heart sounds normal. EXTREMITIES: No swelling in legs. Sensation to light touch intact on feet.           Assessment & Plan:  Assessment and Plan    Type 2 Diabetes Mellitus   A1c increased from 6.8 to 7.8 over the past 5 months. Patient has made significant dietary changes and plans to start a workout regimen. Discussed the importance of consistent diet and exercise in managing diabetes. Patient requested to try lifestyle modifications for 90 more days before considering medication.   -Order glucometer for home glucose monitoring.   -Consider continuous glucose monitoring for 4 weeks for detailed feedback on dietary choices and his impact on glucose levels.   -Repeat A1c and other blood work in 3 months (early February 2025).    Psoriasis   Patient reports significant improvement with SkyRizi injections. No current issues with cracking, bleeding, or severe itching.   -Continue SkyRizi as prescribed.    General Health Maintenance   -Encouraged to schedule annual eye exam to monitor for diabetic retinopathy.   -Consider annual flu vaccination.   -Encouraged to resume regular walking or other physical activity.   -Follow-up appointment in spring 2025.      1. Diabetes mellitus without complication (HCC) I am concerned about the A1c going up patient states that he needs to make major changes in his diet and activity we will see how this does in  3 months if it is not dramatically better he needs to be on medications  2. Essential hypertension Blood pressure is good control overall  3. Hyperlipidemia, unspecified hyperlipidemia type Keep LDL below 70  4. Morbid obesity (HCC) Portion control regular activity  5. Plaque psoriasis Continue [employer]

## 2023-04-17 ENCOUNTER — Other Ambulatory Visit: Payer: Self-pay

## 2023-04-17 DIAGNOSIS — E119 Type 2 diabetes mellitus without complications: Secondary | ICD-10-CM

## 2023-04-17 MED ORDER — BLOOD GLUCOSE TEST VI STRP: 1.0000 | ORAL_STRIP | Freq: Three times a day (TID) | 0 refills | Status: AC

## 2023-04-17 MED ORDER — LANCET DEVICE MISC: 1.0000 | Freq: Three times a day (TID) | 0 refills | Status: AC

## 2023-04-17 MED ORDER — BLOOD GLUCOSE MONITORING SUPPL DEVI: 1.0000 | Freq: Three times a day (TID) | 0 refills | Status: AC

## 2023-04-17 MED ORDER — LANCETS MISC. MISC: 1.0000 | Freq: Three times a day (TID) | 0 refills | Status: AC

## 2023-05-06 ENCOUNTER — Other Ambulatory Visit: Payer: Self-pay | Admitting: Family Medicine

## 2023-05-06 MED ORDER — ROSUVASTATIN CALCIUM 20 MG PO TABS: 20.0000 mg | ORAL_TABLET | Freq: Every day | ORAL | 1 refills | Status: DC

## 2023-05-18 ENCOUNTER — Other Ambulatory Visit: Payer: Self-pay | Admitting: Family Medicine

## 2023-05-18 DIAGNOSIS — I10 Essential (primary) hypertension: Secondary | ICD-10-CM

## 2023-09-03 ENCOUNTER — Ambulatory Visit
Admission: EM | Admit: 2023-09-03 | Discharge: 2023-09-03 | Disposition: A | Discharge status: Home / Self Care | Attending: Nurse Practitioner | Admitting: Nurse Practitioner

## 2023-09-03 DIAGNOSIS — L237 Allergic contact dermatitis due to plants, except food: Secondary | ICD-10-CM | POA: Diagnosis not present

## 2023-09-03 MED ORDER — DEXAMETHASONE SODIUM PHOSPHATE 10 MG/ML IJ SOLN
10.0000 mg | INTRAMUSCULAR | Status: AC
  Administered 2023-09-03: 10 mg via INTRAMUSCULAR

## 2023-09-03 MED ORDER — TRIAMCINOLONE ACETONIDE 0.1 % EX CREA: 1.0000 | TOPICAL_CREAM | Freq: Two times a day (BID) | CUTANEOUS | 0 refills | Status: AC

## 2023-09-03 NOTE — Discharge Instructions (Addendum)
 You were given an injection of Decadron 10 mg. Apply medication as prescribed. May also take over-the-counter Zyrtec to help with itching. Avoid hot baths or showers while symptoms persist.  Recommend taking lukewarm baths. May apply cool cloths to the area to help with itching or discomfort. Avoid scratching, rubbing, or manipulating the areas while symptoms persist. Recommend Aveeno Colloidal Oatmeal bath to use to help with drying and itching. Follow up if symptoms do not improve.

## 2023-09-03 NOTE — ED Provider Notes (Signed)
 [facility]    CSN: [id number] Arrival date & time: 09/03/23  1733      History   Chief Complaint No chief complaint on file.   HPI Nicolas [name] is a 48 y.o. male.   The history is provided by the patient.   Patient presents for complaints of rash to the left arm.  Patient states he was cutting down bushes approximately 2 days ago prior to the rash starting.  He states the rash is "itchy."  Thinks he may have come into contact with poison ivy.  Denies fever, chills, foul-smelling drainage from the site, painful rash, states he has been using calamine lotion to the site.  Denies exposure to new soaps, lotions, medications, or food.  Past Medical History:  Diagnosis Date   Anxiety    Carotid artery disease (HCC)    History of stomach ulcers    Hypertension    Hypertriglyceridemia    Kidney stone    bil , different times  passed stones   Migraines    Obesity (BMI 30-39.9)    Pancreatitis    Stress    Syncope and collapse     Patient Active Problem List   Diagnosis Date Noted   Diabetes mellitus type 2, controlled/A1C 6.8 10/31/2022   Prediabetes 10/30/2022   Diarrhea 10/30/2022   Elevated ALT measurement 10/30/2022   Stenosis of carotid artery 05/17/2022   Syncope 04/06/2022   Elevated troponin 04/06/2022   Hyperlipidemia 04/06/2022   GERD (gastroesophageal reflux disease) 04/06/2022   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   Bilateral carpal tunnel syndrome 06/02/2021   Peptic ulcer disease 02/10/2014   Morbid obesity (HCC) 02/01/2014   Essential hypertension 10/24/2012    Past Surgical History:  Procedure Laterality Date   BIOPSY N/A 02/11/2014   Procedure: BIOPSY;  Surgeon: Corbin Ade, MD;  Location: [facility];  Service: Endoscopy;  Laterality: N/A;   BIOPSY  10/31/2022   Procedure: BIOPSY;  Surgeon: Dolores Frame, MD;  Location: [facility];  Service: Gastroenterology;;   CARDIAC CATHETERIZATION  Aug 2015   normal EF, normal coronaries    CARDIAC CATHETERIZATION     CARPAL TUNNEL RELEASE Right 08/03/2021   Procedure: Right CARPAL TUNNEL RELEASE;  Surgeon: Marlyne Beards, MD;  Location: [facility];  Service: Orthopedics;  Laterality: Right;   CARPAL TUNNEL RELEASE Left 11/09/2021   Procedure: LEFT CARPAL TUNNEL RELEASE;  Surgeon: Marlyne Beards, MD;  Location: [facility];  Service: Orthopedics;  Laterality: Left;   COLONOSCOPY WITH PROPOFOL N/A 10/31/2022   Procedure: COLONOSCOPY WITH PROPOFOL;  Surgeon: Dolores Frame, MD;  Location: [facility];  Service: Gastroenterology;  Laterality: N/A;   ESOPHAGOGASTRODUODENOSCOPY (EGD) WITH PROPOFOL N/A 02/11/2014   Procedure: ESOPHAGOGASTRODUODENOSCOPY (EGD) WITH PROPOFOL;  Surgeon: Corbin Ade, MD;  Location: [facility];  Service: Endoscopy;  Laterality: N/A;   ESOPHAGOGASTRODUODENOSCOPY (EGD) WITH PROPOFOL N/A 10/31/2022   Procedure: ESOPHAGOGASTRODUODENOSCOPY (EGD) WITH PROPOFOL;  Surgeon: Dolores Frame, MD;  Location: [facility];  Service: Gastroenterology;  Laterality: N/A;   LEFT HEART CATHETERIZATION WITH CORONARY ANGIOGRAM N/A 01/18/2014   Procedure: LEFT HEART CATHETERIZATION WITH CORONARY ANGIOGRAM;  Surgeon: Lennette Bihari, MD;  Location: [facility];  Service: Cardiovascular;  Laterality: N/A;   POLYPECTOMY  10/31/2022   Procedure: POLYPECTOMY INTESTINAL;  Surgeon: Dolores Frame, MD;  Location: [facility];  Service: Gastroenterology;;   SUBMUCOSAL TATTOO INJECTION  10/31/2022   Procedure: SUBMUCOSAL TATTOO INJECTION;  Surgeon: Marguerita Merles,  Reuel Boom, MD;  Location: [facility];  Service: Gastroenterology;;       Home Medications    Prior to Admission medications   Medication Sig Start Date End Date Taking? Authorizing Provider  triamcinolone cream (KENALOG) 0.1 % Apply 1 Application topically 2 (two) times daily. 09/03/23  Yes Leath-Warren, Sadie Haber, NP  albuterol (VENTOLIN HFA) 108 (90 Base)  MCG/ACT inhaler Inhale 2 puffs into the lungs every 4 (four) hours as needed for wheezing or shortness of breath. Patient not taking: Reported on 11/07/2022 07/06/22   Particia Nearing, PA-C  amLODipine (NORVASC) 10 MG tablet TAKE ONE TABLET BY MOUTH EVERY DAY 05/18/23   [name], MD  Blood Glucose Monitoring Suppl DEVI 1 each by Does not apply route in the morning, at noon, and at bedtime. May substitute to any manufacturer covered by patient's insurance. 04/17/23   [name], MD  clobetasol ointment (TEMOVATE) 0.05 % APPLY TO AFFECTED AREAS TWICE DAILY AS NEEDED. DO NOT APPLY TO FACE. 10/16/22   [name], MD  pantoprazole (PROTONIX) 40 MG tablet Take 1 tablet (40 mg total) by mouth 2 (two) times daily. Patient not taking: Reported on 04/13/2023 11/07/22   [name], MD  rosuvastatin (CRESTOR) 20 MG tablet Take 1 tablet (20 mg total) by mouth daily. 05/06/23 04/30/24  [name], MD  SKYRIZI PEN 150 MG/ML SOAJ Inject 150 mg into the skin See admin instructions.    [provider]  traMADol (ULTRAM) 50 MG tablet Take 1 tablet (50 mg total) by mouth every 6 (six) hours as needed. Patient not taking: Reported on 11/07/2022 10/24/22   Cathren Laine, MD    Family History Family History  Problem Relation Age of Onset   Cancer Mother        Breast   Colon cancer Neg Hx     Social History Social History   Tobacco Use   Smoking status: Former    Current packs/day: 0.00    Average packs/day: 2.0 packs/day for 10.0 years (20.0 ttl pk-yrs)    Types: Cigarettes    Start date: 12/06/2010    Quit date: 12/05/2020    Years since quitting: 2.7   Smokeless tobacco: Never  Vaping Use   Vaping status: Never Used  Substance Use Topics   Alcohol use: No   Drug use: No     Allergies   Zoloft [sertraline]   Review of Systems Review of Systems Per HPI  Physical Exam Triage Vital Signs ED Triage Vitals  Encounter Vitals Group     BP 09/03/23 1757 (!)  145/85     Systolic BP Percentile --      Diastolic BP Percentile --      Pulse Rate 09/03/23 1757 92     Resp 09/03/23 1757 16     Temp 09/03/23 1757 98.5 F (36.9 C)     Temp Source 09/03/23 1757 Oral     SpO2 09/03/23 1757 95 %     Weight --      Height --      Head Circumference --      Peak Flow --      Pain Score 09/03/23 1800 0     Pain Loc --      Pain Education --      Exclude from Growth Chart --    No data found.  Updated Vital Signs BP (!) 145/85 (BP Location: Right Arm)   Pulse 92   Temp  98.5 F (36.9 C) (Oral)   Resp 16   SpO2 95%   Visual Acuity Right Eye Distance:   Left Eye Distance:   Bilateral Distance:    Right Eye Near:   Left Eye Near:    Bilateral Near:     Physical Exam Vitals and nursing note reviewed.  Constitutional:      General: He is not in acute distress.    Appearance: Normal appearance.  HENT:     Head: Normocephalic.  Eyes:     Extraocular Movements: Extraocular movements intact.     Pupils: Pupils are equal, round, and reactive to light.  Pulmonary:     Effort: Pulmonary effort is normal.  Musculoskeletal:     Cervical back: Normal range of motion.  Skin:    General: Skin is warm and dry.     Findings: Rash present. Rash is macular and papular.     Comments: Erythematous patch noted to the left antecubital with diffuse linear areas of maculopapular rash to the left forearm.  Areas are raised.  There is no oozing, fluctuance, or drainage present.  Neurological:     General: No focal deficit present.     Mental Status: He is alert and oriented to person, place, and time.  Psychiatric:        Mood and Affect: Mood normal.        Behavior: Behavior normal.      UC Treatments / Results  Labs (all labs ordered are listed, but only abnormal results are displayed) Labs Reviewed - No data to display  EKG   Radiology No results found.  Procedures Procedures (including critical care time)  Medications Ordered in  UC Medications  dexamethasone (DECADRON) injection 10 mg (has no administration in time range)    Initial Impression / Assessment and Plan / UC Course  I have reviewed the triage vital signs and the nursing notes.  Pertinent labs & imaging results that were available during my care of the patient were reviewed by me and considered in my medical decision making (see chart for details).  Symptoms most likely caused by contact with poison ivy.  Consistent with contact dermatitis.  Decadron 10 mg IM administered in the clinic.  Triamcinolone cream 0.1% prescribed for inflammation and itching.  Supportive care recommendations were provided and discussed with the patient to include cool compresses to the site, use of OTC antihistamines, and using Aveeno colloidal oatmeal bath.  Discussed indications regarding follow-up.  Patient was in agreement with this plan of care and verbalized understanding.  All questions were answered.  Patient stable for discharge.  Final Clinical Impressions(s) / UC Diagnoses   Final diagnoses:  Contact dermatitis due to poison ivy     Discharge Instructions      You were given an injection of Decadron 10 mg. Apply medication as prescribed. May also take over-the-counter Zyrtec to help with itching. Avoid hot baths or showers while symptoms persist.  Recommend taking lukewarm baths. May apply cool cloths to the area to help with itching or discomfort. Avoid scratching, rubbing, or manipulating the areas while symptoms persist. Recommend Aveeno Colloidal Oatmeal bath to use to help with drying and itching. Follow up if symptoms do not improve.      ED Prescriptions     Medication Sig Dispense Auth. Provider   triamcinolone cream (KENALOG) 0.1 % Apply 1 Application topically 2 (two) times daily. 45 g Leath-Warren, Sadie Haber, NP      PDMP not reviewed this  encounter.   Abran Cantor, NP 09/03/23 1818

## 2023-09-03 NOTE — ED Triage Notes (Signed)
 Pt reports poison Ivy to the located on the left arm. Noticed the rash from cutting bushes on Friday has tried OTC meds and has found no relief.

## 2023-10-25 ENCOUNTER — Other Ambulatory Visit: Payer: Self-pay | Admitting: Family Medicine

## 2023-10-25 DIAGNOSIS — I10 Essential (primary) hypertension: Secondary | ICD-10-CM

## 2023-12-17 ENCOUNTER — Ambulatory Visit

## 2023-12-17 DIAGNOSIS — E119 Type 2 diabetes mellitus without complications: Secondary | ICD-10-CM

## 2023-12-17 LAB — HM DIABETES EYE EXAM

## 2023-12-17 NOTE — Progress Notes (Unsigned)
 Nicolas [name] arrived 12/17/2023 and has given verbal consent to obtain images and complete their overdue diabetic retinal screening.  The images have been sent to an ophthalmologist or optometrist for review and interpretation.  Results will be sent back to Alphonsa Glendia LABOR, MD for review.  Patient has been informed they will be contacted when we receive the results via telephone or MyChart

## 2024-01-15 ENCOUNTER — Encounter: Payer: Self-pay | Admitting: *Deleted

## 2024-01-18 ENCOUNTER — Encounter: Payer: Self-pay | Admitting: Family Medicine

## 2024-02-15 ENCOUNTER — Ambulatory Visit: Admitting: Family Medicine

## 2024-02-15 ENCOUNTER — Encounter: Payer: Self-pay | Admitting: Family Medicine

## 2024-02-15 VITALS — BP 145/90 | HR 89 | Temp 98.4°F | Ht 75.0 in | Wt 284.0 lb

## 2024-02-15 DIAGNOSIS — E119 Type 2 diabetes mellitus without complications: Secondary | ICD-10-CM

## 2024-02-15 DIAGNOSIS — R1013 Epigastric pain: Secondary | ICD-10-CM

## 2024-02-15 DIAGNOSIS — L400 Psoriasis vulgaris: Secondary | ICD-10-CM

## 2024-02-15 DIAGNOSIS — I10 Essential (primary) hypertension: Secondary | ICD-10-CM | POA: Diagnosis not present

## 2024-02-15 DIAGNOSIS — G4730 Sleep apnea, unspecified: Secondary | ICD-10-CM

## 2024-02-15 DIAGNOSIS — E785 Hyperlipidemia, unspecified: Secondary | ICD-10-CM | POA: Diagnosis not present

## 2024-02-15 MED ORDER — AMLODIPINE BESYLATE 10 MG PO TABS
10.0000 mg | ORAL_TABLET | Freq: Every day | ORAL | 4 refills | Status: AC
Start: 2024-02-15 — End: ?

## 2024-02-15 MED ORDER — ROSUVASTATIN CALCIUM 20 MG PO TABS: 20.0000 mg | ORAL_TABLET | Freq: Every day | ORAL | 1 refills | Status: AC

## 2024-02-15 MED ORDER — CLOBETASOL PROPIONATE 0.05 % EX OINT: TOPICAL_OINTMENT | CUTANEOUS | 0 refills | Status: AC

## 2024-02-15 NOTE — Progress Notes (Signed)
   Subjective:    Patient ID: Nicolas [name], male    DOB: 01/10/1976, 48 y.o.   MRN: [id number]  HPI  Questions about methtrxate and folic acid  given by another provider for skin condition  Patient does have underlying psoriasis Used to be on Skyrizi No longer covered by his insurance He has to do stepwise therapy Has underlying blood pressure issues cholesterol issues History of diabetes Lost to follow-up but came back today Relates a lot of increased thirst increased urination intermittent blurred vision denies dizziness drowsiness denies passing out.  States Nelma did a very good job keeping his psoriasis under control Relates to current medication methotrexate he is concerned about it we did discuss how it has to be monitored by dermatology  Review of Systems     Objective:   Physical Exam  General-in no acute distress Eyes-no discharge Lungs-respiratory rate normal, CTA CV-no murmurs,RRR Extremities skin warm dry no edema Neuro grossly normal Behavior normal, alert       Assessment & Plan:  1. Diabetes mellitus without complication (HCC) (Primary) I am concerned that diabetes is not under good control check A1c await the results may need to do interventions including medications - Hemoglobin A1c - Hepatic Function Panel - Microalbumin/Creatinine Ratio, Urine  2. Essential hypertension Blood pressure is borderline he states it is better when he is at home he would like to work harder on healthy eating regular activity and trying to bring it down - amLODipine  (NORVASC ) 10 MG tablet; Take 1 tablet (10 mg total) by mouth daily.  Dispense: 30 tablet; Refill: 4 - Basic Metabolic Panel - Hepatic Function Panel - Microalbumin/Creatinine Ratio, Urine  3. Hyperlipidemia, unspecified hyperlipidemia type Cholesterol under decent control previously on medicine he is taking medicine currently check lab work - Lipid Panel  4. Epigastric pain Occasional epigastric pain  but not often not severe does not wake him up at night recommend healthy eating if it becomes more frequent we will need acid blocker - Hepatic Function Panel  5. Sleep apnea, unspecified type Patient snores at night fatigue tiredness during the day sleepiness during the day energy level subpar symptoms have been present over the past few months his wife states that he stops breathing at night I highly recommended sleep apnea evaluation - Ambulatory referral to Neurology  6. Plaque psoriasis Importance to get this under control Methotrexate if monitored is a acceptable treatment If he does not tolerate that he would do well with one of the other interventions Patient is interested in seeing dermatology in [place] - Ambulatory referral to Dermatology  Await lab results interventions based on that Follow-up office visit 6 months sooner problems

## 2024-03-26 ENCOUNTER — Encounter (INDEPENDENT_AMBULATORY_CARE_PROVIDER_SITE_OTHER): Payer: Self-pay | Admitting: Gastroenterology

## 2024-05-14 ENCOUNTER — Ambulatory Visit: Admitting: Family Medicine

## 2024-07-10 ENCOUNTER — Other Ambulatory Visit: Payer: Self-pay

## 2024-07-10 ENCOUNTER — Emergency Department (HOSPITAL_COMMUNITY)

## 2024-07-10 ENCOUNTER — Emergency Department (HOSPITAL_COMMUNITY): Admission: EM | Admit: 2024-07-10 | Discharge: 2024-07-11 | Disposition: A | Discharge status: Home / Self Care

## 2024-07-10 DIAGNOSIS — W000XXA Fall on same level due to ice and snow, initial encounter: Secondary | ICD-10-CM | POA: Insufficient documentation

## 2024-07-10 DIAGNOSIS — M542 Cervicalgia: Secondary | ICD-10-CM | POA: Insufficient documentation

## 2024-07-10 DIAGNOSIS — S32018A Other fracture of first lumbar vertebra, initial encounter for closed fracture: Secondary | ICD-10-CM | POA: Insufficient documentation

## 2024-07-10 DIAGNOSIS — S3992XA Unspecified injury of lower back, initial encounter: Secondary | ICD-10-CM | POA: Diagnosis present

## 2024-07-10 DIAGNOSIS — W19XXXA Unspecified fall, initial encounter: Secondary | ICD-10-CM

## 2024-07-10 DIAGNOSIS — S32010A Wedge compression fracture of first lumbar vertebra, initial encounter for closed fracture: Secondary | ICD-10-CM

## 2024-07-10 MED ORDER — NAPROXEN 500 MG PO TABS: 500.0000 mg | ORAL_TABLET | Freq: Two times a day (BID) | ORAL | 0 refills | Status: AC

## 2024-07-10 MED ORDER — METHOCARBAMOL 500 MG PO TABS: 1000.0000 mg | ORAL_TABLET | Freq: Four times a day (QID) | ORAL | 0 refills | Status: AC | PRN

## 2024-07-10 MED ORDER — KETOROLAC TROMETHAMINE 15 MG/ML IJ SOLN
15.0000 mg | Freq: Once | INTRAMUSCULAR | Status: AC
  Administered 2024-07-10: 15 mg via INTRAMUSCULAR
  Filled 2024-07-10: qty 1

## 2024-07-10 MED ORDER — METHOCARBAMOL 500 MG PO TABS
1000.0000 mg | ORAL_TABLET | Freq: Once | ORAL | Status: AC
  Administered 2024-07-10: 1000 mg via ORAL
  Filled 2024-07-10: qty 2

## 2024-07-10 NOTE — ED Provider Notes (Signed)
 " [facility] Provider Note   CSN: [id number] Arrival date & time: 07/10/24  1650     Patient presents with: Nicolas [name] is a 49 y.o. male.  {Add pertinent medical, surgical, social history, OB history to HPI:9453} 49 year old male presents for evaluation of fall.  Was walking on the ice outside of this [place] and he slipped and fell and hit his back and head.  States has had feels fine he has some mild lateral neck pain but he is complaining mostly of lower back pain.  Denies using consciousness.  He is not on any blood thinners.  Denies any other symptoms or concerns.   Fall Pertinent negatives include no chest pain, no abdominal pain and no shortness of breath.       Prior to Admission medications  Medication Sig Start Date End Date Taking? Authorizing Provider  amLODipine  (NORVASC ) 10 MG tablet Take 1 tablet (10 mg total) by mouth daily. 02/15/24   Alphonsa Glendia LABOR, MD  Blood Glucose Monitoring Suppl DEVI 1 each by Does not apply route in the morning, at noon, and at bedtime. May substitute to any manufacturer covered by patient's insurance. 04/17/23   Alphonsa Glendia LABOR, MD  clobetasol  ointment (TEMOVATE ) 0.05 % APPLY TO AFFECTED AREAS TWICE DAILY AS NEEDED. DO NOT APPLY TO FACE. 02/15/24   Alphonsa Glendia LABOR, MD  folic acid  (FOLVITE ) 1 MG tablet Take 1 mg by mouth daily. Patient not taking: Reported on 02/15/2024    [provider]  methotrexate (RHEUMATREX) 2.5 MG tablet Take 10 mg by mouth once a week. 11/01/23   [provider]  rosuvastatin  (CRESTOR ) 20 MG tablet Take 1 tablet (20 mg total) by mouth daily. 02/15/24 02/09/25  Alphonsa Glendia LABOR, MD  triamcinolone  cream (KENALOG ) 0.1 % Apply 1 Application topically 2 (two) times daily. Patient not taking: Reported on 02/15/2024 09/03/23   Leath-Warren, Etta PARAS, NP    Allergies: Zoloft  [sertraline ]    Review of Systems  Constitutional:  Negative for chills and fever.   HENT:  Negative for ear pain and sore throat.   Eyes:  Negative for pain and visual disturbance.  Respiratory:  Negative for cough and shortness of breath.   Cardiovascular:  Negative for chest pain and palpitations.  Gastrointestinal:  Negative for abdominal pain and vomiting.  Genitourinary:  Negative for dysuria and hematuria.  Musculoskeletal:  Positive for back pain. Negative for arthralgias.  Skin:  Negative for color change and rash.  Neurological:  Negative for seizures and syncope.  All other systems reviewed and are negative.   Updated Vital Signs BP (!) 149/96 (BP Location: Right Arm)   Pulse (!) 105   Temp 98.6 F (37 C) (Oral)   Resp 18   Ht 6' 3 (1.905 m)   Wt 94.3 kg   SpO2 93%   BMI 26.00 kg/m   Physical Exam Vitals and nursing note reviewed.  Constitutional:      General: He is not in acute distress.    Appearance: Normal appearance. He is well-developed. He is not ill-appearing.  HENT:     Head: Normocephalic and atraumatic.  Eyes:     Conjunctiva/sclera: Conjunctivae normal.  Cardiovascular:     Rate and Rhythm: Normal rate and regular rhythm.     Heart sounds: No murmur heard. Pulmonary:     Effort: Pulmonary effort is normal. No respiratory distress.     Breath sounds: Normal breath sounds.  Abdominal:     Palpations: Abdomen is soft.     Tenderness: There is no abdominal tenderness.  Musculoskeletal:        General: Tenderness present. No swelling.     Cervical back: Neck supple.     Comments: Bilateral lower back pain musculature tenderness to palpation  Skin:    General: Skin is warm and dry.     Capillary Refill: Capillary refill takes less than 2 seconds.  Neurological:     General: No focal deficit present.     Mental Status: He is alert.  Psychiatric:        Mood and Affect: Mood normal.     (all labs ordered are listed, but only abnormal results are displayed) Labs Reviewed - No data to display  EKG: None  Radiology: No  results found.  {Document cardiac monitor, telemetry assessment procedure when appropriate:32947} Procedures   Medications Ordered in the ED  ketorolac  (TORADOL ) 15 MG/ML injection 15 mg (has no administration in time range)  methocarbamol  (ROBAXIN ) tablet 1,000 mg (has no administration in time range)      {Click here for ABCD2, HEART and other calculators REFRESH Note before signing:1}                              Medical Decision Making Amount and/or Complexity of Data Reviewed Radiology: ordered.  Risk Prescription drug management.   ***  {Document critical care time when appropriate  Document review of labs and clinical decision tools ie CHADS2VASC2, etc  Document your independent review of radiology images and any outside records  Document your discussion with family members, caretakers and with consultants  Document social determinants of health affecting pt's care  Document your decision making why or why not admission, treatments were needed:32947:::1}   Final diagnoses:  None    ED Discharge Orders     None        "

## 2024-07-10 NOTE — ED Triage Notes (Signed)
 Pt reports slipping on ice at [place] and hitting the back of his head and back. Denies LOC.

## 2024-07-10 NOTE — ED Notes (Signed)
 Pt said he would buy a brace at [facility] [employer]. Staff informed pt that he should to be fitted for the brace. Pt asking if he could pick the brace up later. Staff informed him that he needs to be fitted for the brace. Pt sitting in room at this time.

## 2024-07-10 NOTE — Discharge Instructions (Signed)
 Wear your TLSO brace as needed.  Use your naproxen  as needed and your Robaxin  as needed call and follow-up with neurosurgery in 2 weeks.

## 2024-07-10 NOTE — ED Notes (Signed)
 [facility] called for TLSO Brace

## 2024-07-11 NOTE — ED Notes (Signed)
 [facility] Ortho Tech reviewed back brace utilization with Pt. Pt agreeable for discharge.

## 2024-08-27 ENCOUNTER — Ambulatory Visit: Admitting: Family Medicine

## 2024-11-11 ENCOUNTER — Ambulatory Visit: Admitting: Physician Assistant
